# Patient Record
Sex: Male | Born: 1950
Health system: Southern US, Community
[De-identification: ages and names within clinical notes are randomized; demographics above are authoritative.]

## PROBLEM LIST (undated history)

## (undated) DIAGNOSIS — C649 Malignant neoplasm of unspecified kidney, except renal pelvis: Secondary | ICD-10-CM

## (undated) DIAGNOSIS — I214 Non-ST elevation (NSTEMI) myocardial infarction: Secondary | ICD-10-CM

## (undated) DIAGNOSIS — C419 Malignant neoplasm of bone and articular cartilage, unspecified: Secondary | ICD-10-CM

## (undated) DIAGNOSIS — M109 Gout, unspecified: Secondary | ICD-10-CM

## (undated) DIAGNOSIS — M1990 Unspecified osteoarthritis, unspecified site: Secondary | ICD-10-CM

## (undated) DIAGNOSIS — C801 Malignant (primary) neoplasm, unspecified: Secondary | ICD-10-CM

## (undated) DIAGNOSIS — B029 Zoster without complications: Secondary | ICD-10-CM

## (undated) DIAGNOSIS — I10 Essential (primary) hypertension: Secondary | ICD-10-CM

## (undated) HISTORY — PX: KNEE SURGERY: SHX244

---

## 2013-08-31 DIAGNOSIS — B029 Zoster without complications: Secondary | ICD-10-CM

## 2013-08-31 HISTORY — DX: Zoster without complications: B02.9

## 2014-01-09 ENCOUNTER — Emergency Department (HOSPITAL_BASED_OUTPATIENT_CLINIC_OR_DEPARTMENT_OTHER)
Admission: EM | Admit: 2014-01-09 | Discharge: 2014-01-09 | Disposition: A | Discharge status: Home / Self Care | Payer: Medicare Other | Attending: Emergency Medicine | Admitting: Emergency Medicine

## 2014-01-09 ENCOUNTER — Encounter (HOSPITAL_BASED_OUTPATIENT_CLINIC_OR_DEPARTMENT_OTHER): Payer: Self-pay | Admitting: *Deleted

## 2014-01-09 ENCOUNTER — Emergency Department (HOSPITAL_BASED_OUTPATIENT_CLINIC_OR_DEPARTMENT_OTHER): Payer: Medicare Other

## 2014-01-09 DIAGNOSIS — Z880 Allergy status to penicillin: Secondary | ICD-10-CM | POA: Insufficient documentation

## 2014-01-09 DIAGNOSIS — R0989 Other specified symptoms and signs involving the circulatory and respiratory systems: Secondary | ICD-10-CM | POA: Diagnosis not present

## 2014-01-09 DIAGNOSIS — I509 Heart failure, unspecified: Secondary | ICD-10-CM | POA: Diagnosis not present

## 2014-01-09 DIAGNOSIS — I10 Essential (primary) hypertension: Secondary | ICD-10-CM | POA: Insufficient documentation

## 2014-01-09 DIAGNOSIS — R05 Cough: Secondary | ICD-10-CM | POA: Diagnosis present

## 2014-01-09 LAB — CBC
HCT: 40.8 % (ref 39.0–52.0)
Hemoglobin: 13.1 g/dL (ref 13.0–17.0)
MCH: 27.5 pg (ref 26.0–34.0)
MCHC: 32.1 g/dL (ref 30.0–36.0)
MCV: 85.7 fL (ref 78.0–100.0)
Platelets: 311 10*3/uL (ref 150–400)
RBC: 4.76 MIL/uL (ref 4.22–5.81)
RDW: 15 % (ref 11.5–15.5)
WBC: 7.1 10*3/uL (ref 4.0–10.5)

## 2014-01-09 LAB — BASIC METABOLIC PANEL
Anion gap: 14 (ref 5–15)
BUN: 13 mg/dL (ref 6–23)
CALCIUM: 9.9 mg/dL (ref 8.4–10.5)
CO2: 26 meq/L (ref 19–32)
CREATININE: 0.9 mg/dL (ref 0.50–1.35)
Chloride: 102 mEq/L (ref 96–112)
GFR calc non Af Amer: 87 mL/min — ABNORMAL LOW (ref >90)
Glucose, Bld: 115 mg/dL — ABNORMAL HIGH (ref 70–99)
Potassium: 4.2 mEq/L (ref 3.7–5.3)
SODIUM: 142 meq/L (ref 137–147)

## 2014-01-09 LAB — PRO B NATRIURETIC PEPTIDE: Pro B Natriuretic peptide (BNP): 1971 pg/mL — ABNORMAL HIGH (ref 0–125)

## 2014-01-09 LAB — TROPONIN I: Troponin I: <0.3 ng/mL (ref <0.30)

## 2014-01-09 MED ORDER — FUROSEMIDE 10 MG/ML IJ SOLN
20.0000 mg | Freq: Once | INTRAMUSCULAR | Status: AC
  Administered 2014-01-09: 20 mg via INTRAVENOUS
  Filled 2014-01-09: qty 2

## 2014-01-09 MED ORDER — ENALAPRIL MALEATE 10 MG PO TABS: 5.0000 mg | ORAL_TABLET | Freq: Every day | ORAL | Status: DC

## 2014-01-09 MED ORDER — FUROSEMIDE 20 MG PO TABS: 20.0000 mg | ORAL_TABLET | Freq: Every day | ORAL | Status: DC

## 2014-01-09 MED ORDER — LISINOPRIL 10 MG PO TABS
5.0000 mg | ORAL_TABLET | Freq: Once | ORAL | Status: AC
Start: 2014-01-09 — End: 2014-01-09
  Administered 2014-01-09: 5 mg via ORAL
  Filled 2014-01-09: qty 1

## 2014-01-09 MED ORDER — CARVEDILOL 3.125 MG PO TABS: 3.1250 mg | ORAL_TABLET | Freq: Two times a day (BID) | ORAL | Status: DC

## 2014-01-09 NOTE — ED Provider Notes (Signed)
CSN: [id number]     Arrival date & time 01/09/14  3016 History   First MD Initiated Contact with Patient 01/09/14 1018     Chief Complaint  Patient presents with  . Cough     (Consider location/radiation/quality/duration/timing/severity/associated sxs/prior Treatment) HPI Complains of nonproductive cough for 2 months, unchanged he presents today because "I was up all night last night coughing". Denies chest pain denies fever denies shortness of breath no treatment prior to coming here. Nothing makes symptoms better or worse. History reviewed. No pertinent past medical history. Past Surgical History  Procedure Laterality Date  . Knee surgery     History reviewed. No pertinent family history. History  Substance Use Topics  . Smoking status: Never Smoker   . Smokeless tobacco: Not on file  . Alcohol Use: Yes    Review of Systems  Constitutional: Negative.   HENT: Negative.   Respiratory: Positive for cough.   Cardiovascular: Negative.   Gastrointestinal: Negative.   Musculoskeletal: Negative.   Skin: Negative.   Neurological: Negative.   Psychiatric/Behavioral: Negative.   All other systems reviewed and are negative.     Allergies  Penicillins  Home Medications   Prior to Admission medications   Not on File   BP 205/112 mmHg  Pulse 99  Temp(Src) 98 F (36.7 C) (Oral)  Resp 20  Ht 5\' 8"  (1.727 m)  Wt 205 lb (92.987 kg)  BMI 31.18 kg/m2  SpO2 96% Physical Exam  Constitutional: He appears well-developed and well-nourished.  HENT:  Head: Normocephalic and atraumatic.  Eyes: Conjunctivae are normal. Pupils are equal, round, and reactive to light.  Neck: Neck supple. No tracheal deviation present. No thyromegaly present.  Cardiovascular: Normal rate and regular rhythm.   No murmur heard. Pulmonary/Chest: Effort normal and breath sounds normal.  Abdominal: Soft. Bowel sounds are normal. He exhibits no distension. There is no tenderness.  Musculoskeletal:  Normal range of motion. He exhibits no edema or tenderness.  Neurological: He is alert. Coordination normal.  Skin: Skin is warm and dry. No rash noted.  Psychiatric: He has a normal mood and affect.  Nursing note and vitals reviewed.   ED Course  Procedures (including critical care time) Labs Review Labs Reviewed  BASIC METABOLIC PANEL    Imaging Review No results found.   EKG Interpretation   Date/Time:  Monday January 09 2014 09:54:58 EST Ventricular Rate:  106 PR Interval:  146 QRS Duration: 86 QT Interval:  366 QTC Calculation: 486 R Axis:   51 Text Interpretation:  Sinus tachycardia Left ventricular hypertrophy with  repolarization abnormality Abnormal ECG No old tracing to compare  Confirmed by Tyshawn Keel  MD, Natosha Bou [phone]) on 01/09/2014 10:23:34 AM     2pmPatient diuresed 1165 mL after treatment with intravenous Lasix. Patient feels well and ready to go home Chest x-ray viewed by me Results for orders placed or performed during the hospital encounter of 23/55/73  Basic metabolic panel  Result Value Ref Range   Sodium 142 137 - 147 mEq/L   Potassium 4.2 3.7 - 5.3 mEq/L   Chloride 102 96 - 112 mEq/L   CO2 26 19 - 32 mEq/L   Glucose, Bld 115 (H) 70 - 99 mg/dL   BUN 13 6 - 23 mg/dL   Creatinine, Ser 0.90 0.50 - 1.35 mg/dL   Calcium 9.9 8.4 - 10.5 mg/dL   GFR calc non Af Amer 87 (L) >90 mL/min   GFR calc Af Amer >90 >90 mL/min   Anion gap  14 5 - 15  Pro b natriuretic peptide (BNP)  Result Value Ref Range   Pro B Natriuretic peptide (BNP) 1971.0 (H) 0 - 125 pg/mL  Troponin I  Result Value Ref Range   Troponin I <0.30 <0.30 ng/mL  CBC  Result Value Ref Range   WBC 7.1 4.0 - 10.5 K/uL   RBC 4.76 4.22 - 5.81 MIL/uL   Hemoglobin 13.1 13.0 - 17.0 g/dL   HCT 40.8 39.0 - 52.0 %   MCV 85.7 78.0 - 100.0 fL   MCH 27.5 26.0 - 34.0 pg   MCHC 32.1 30.0 - 36.0 g/dL   RDW 15.0 11.5 - 15.5 %   Platelets 311 150 - 400 K/uL   Dg Chest 2 View  01/09/2014   CLINICAL  DATA:  Cough for 2 months  EXAM: CHEST  2 VIEW  COMPARISON:  None  FINDINGS: Enlargement of cardiac silhouette with pulmonary vascular congestion.  Mediastinal contours normal.  Peribronchial thickening with accentuation of interstitial markings in a perihilar regions with associated Kerley B-lines at the lung bases favor mild pulmonary edema.  No segmental consolidation, pleural effusion or pneumothorax.  Mild scattered endplate spur formation thoracic spine.  IMPRESSION: Enlargement of cardiac silhouette with pulmonary vascular congestion and probable mild pulmonary edema.   Electronically Signed   By: Lavonia Dana M.D.   On: 01/09/2014 10:53    MDM   Clinically patient and mild congestive heart failure, he speaks in paragraphs is in no rest for distress. Normal pulse ox. Hospitalization offer to patient and encouraged. He vehemently declines hospitalization. I spoke with Dr. Claiborne Billings plan Lasix 29 g IV prior to discharge prescriptions lisinopril 5 mgdaily, Coreg 3.125 mgtwice daily. An appointment has been scheduled for him to see Dr. [name] on 01/13/14 at office Final diagnoses:  None  diagnoses #1 congestive heart failure #2 hypertension      Orlie Dakin, MD 01/09/14 1409

## 2014-01-09 NOTE — Discharge Instructions (Signed)
Heart Failure An office appointment has been scheduled for you with Dr. [name] for Friday, November 13. Be at his office at 2:45 PM. Return if your condition worsens for any reason Heart failure is a condition in which the heart has trouble pumping blood. This means your heart does not pump blood efficiently for your body to work well. In some cases of heart failure, fluid may back up into your lungs or you may have swelling (edema) in your lower legs. Heart failure is usually a long-term (chronic) condition. It is important for you to take good care of yourself and follow your health care provider's treatment plan. CAUSES  Some health conditions can cause heart failure. Those health conditions include:  High blood pressure (hypertension). Hypertension causes the heart muscle to work harder than normal. When pressure in the blood vessels is high, the heart needs to pump (contract) with more force in order to circulate blood throughout the body. High blood pressure eventually causes the heart to become stiff and weak.  Coronary artery disease (CAD). CAD is the buildup of cholesterol and fat (plaque) in the arteries of the heart. The blockage in the arteries deprives the heart muscle of oxygen and blood. This can cause chest pain and may lead to a heart attack. High blood pressure can also contribute to CAD.  Heart attack (myocardial infarction). A heart attack occurs when one or more arteries in the heart become blocked. The loss of oxygen damages the muscle tissue of the heart. When this happens, part of the heart muscle dies. The injured tissue does not contract as well and weakens the heart's ability to pump blood.  Abnormal heart valves. When the heart valves do not open and close properly, it can cause heart failure. This makes the heart muscle pump harder to keep the blood flowing.  Heart muscle disease (cardiomyopathy or myocarditis). Heart muscle disease is damage to the heart muscle from a  variety of causes. These can include drug or alcohol abuse, infections, or unknown reasons. These can increase the risk of heart failure.  Lung disease. Lung disease makes the heart work harder because the lungs do not work properly. This can cause a strain on the heart, leading it to fail.  Diabetes. Diabetes increases the risk of heart failure. High blood sugar contributes to high fat (lipid) levels in the blood. Diabetes can also cause slow damage to tiny blood vessels that carry important nutrients to the heart muscle. When the heart does not get enough oxygen and food, it can cause the heart to become weak and stiff. This leads to a heart that does not contract efficiently.  Other conditions can contribute to heart failure. These include abnormal heart rhythms, thyroid problems, and low blood counts (anemia). Certain unhealthy behaviors can increase the risk of heart failure, including:  Being overweight.  Smoking or chewing tobacco.  Eating foods high in fat and cholesterol.  Abusing illicit drugs or alcohol.  Lacking physical activity. SYMPTOMS  Heart failure symptoms may vary and can be hard to detect. Symptoms may include:  Shortness of breath with activity, such as climbing stairs.  Persistent cough.  Swelling of the feet, ankles, legs, or abdomen.  Unexplained weight gain.  Difficulty breathing when lying flat (orthopnea).  Waking from sleep because of the need to sit up and get more air.  Rapid heartbeat.  Fatigue and loss of energy.  Feeling light-headed, dizzy, or close to fainting.  Loss of appetite.  Nausea.  Increased urination  during the night (nocturia). DIAGNOSIS  A diagnosis of heart failure is based on your history, symptoms, physical examination, and diagnostic tests. Diagnostic tests for heart failure may include:  Echocardiography.  Electrocardiography.  Chest X-ray.  Blood tests.  Exercise stress test.  Cardiac  angiography.  Radionuclide scans. TREATMENT  Treatment is aimed at managing the symptoms of heart failure. Medicines, behavioral changes, or surgical intervention may be necessary to treat heart failure.  Medicines to help treat heart failure may include:  Angiotensin-converting enzyme (ACE) inhibitors. This type of medicine blocks the effects of a blood protein called angiotensin-converting enzyme. ACE inhibitors relax (dilate) the blood vessels and help lower blood pressure.  Angiotensin receptor blockers (ARBs). This type of medicine blocks the actions of a blood protein called angiotensin. Angiotensin receptor blockers dilate the blood vessels and help lower blood pressure.  Water pills (diuretics). Diuretics cause the kidneys to remove salt and water from the blood. The extra fluid is removed through urination. This loss of extra fluid lowers the volume of blood the heart pumps.  Beta blockers. These prevent the heart from beating too fast and improve heart muscle strength.  Digitalis. This increases the force of the heartbeat.  Healthy behavior changes include:  Obtaining and maintaining a healthy weight.  Stopping smoking or chewing tobacco.  Eating heart-healthy foods.  Limiting or avoiding alcohol.  Stopping illicit drug use.  Physical activity as directed by your health care provider.  Surgical treatment for heart failure may include:  A procedure to open blocked arteries, repair damaged heart valves, or remove damaged heart muscle tissue.  A pacemaker to improve heart muscle function and control certain abnormal heart rhythms.  An internal cardioverter defibrillator to treat certain serious abnormal heart rhythms.  A left ventricular assist device (LVAD) to assist the pumping ability of the heart. HOME CARE INSTRUCTIONS   Take medicines only as directed by your health care provider. Medicines are important in reducing the workload of your heart, slowing the  progression of heart failure, and improving your symptoms.  Do not stop taking your medicine unless directed by your health care provider.  Do not skip any dose of medicine.  Refill your prescriptions before you run out of medicine. Your medicines are needed every day.  Engage in moderate physical activity if directed by your health care provider. Moderate physical activity can benefit some people. The elderly and people with severe heart failure should consult with a health care provider for physical activity recommendations.  Eat heart-healthy foods. Food choices should be free of trans fat and low in saturated fat, cholesterol, and salt (sodium). Healthy choices include fresh or frozen fruits and vegetables, fish, lean meats, legumes, fat-free or low-fat dairy products, and whole grain or high fiber foods. Talk to a dietitian to learn more about heart-healthy foods.  Limit sodium if directed by your health care provider. Sodium restriction may reduce symptoms of heart failure in some people. Talk to a dietitian to learn more about heart-healthy seasonings.  Use healthy cooking methods. Healthy cooking methods include roasting, grilling, broiling, baking, poaching, steaming, or stir-frying. Talk to a dietitian to learn more about healthy cooking methods.  Limit fluids if directed by your health care provider. Fluid restriction may reduce symptoms of heart failure in some people.  Weigh yourself every day. Daily weights are important in the early recognition of excess fluid. You should weigh yourself every morning after you urinate and before you eat breakfast. [place] the same amount of clothing each  time you weigh yourself. Record your daily weight. Provide your health care provider with your weight record.  Monitor and record your blood pressure if directed by your health care provider.  Check your pulse if directed by your health care provider.  Lose weight if directed by your health care  provider. Weight loss may reduce symptoms of heart failure in some people.  Stop smoking or chewing tobacco. Nicotine makes your heart work harder by causing your blood vessels to constrict. Do not use nicotine gum or patches before talking to your health care provider.  Keep all follow-up visits as directed by your health care provider. This is important.  Limit alcohol intake to no more than 1 drink per day for nonpregnant women and 2 drinks per day for men. One drink equals 12 ounces of beer, 5 ounces of wine, or 1 ounces of hard liquor. Drinking more than that is harmful to your heart. Tell your health care provider if you drink alcohol several times a week. Talk with your health care provider about whether alcohol is safe for you. If your heart has already been damaged by alcohol or you have severe heart failure, drinking alcohol should be stopped completely.  Stop illicit drug use.  Stay up-to-date with immunizations. It is especially important to prevent respiratory infections through current pneumococcal and influenza immunizations.  Manage other health conditions such as hypertension, diabetes, thyroid disease, or abnormal heart rhythms as directed by your health care provider.  Learn to manage stress.  Plan rest periods when fatigued.  Learn strategies to manage high temperatures. If the weather is extremely hot:  Avoid vigorous physical activity.  Use air conditioning or fans or seek a cooler location.  Avoid caffeine and alcohol.  [place] loose-fitting, lightweight, and light-colored clothing.  Learn strategies to manage cold temperatures. If the weather is extremely cold:  Avoid vigorous physical activity.  Layer clothes.  [place] mittens or gloves, a hat, and a scarf when going outside.  Avoid alcohol.  Obtain ongoing education and support as needed.  Participate in or seek rehabilitation as needed to maintain or improve independence and quality of life. SEEK MEDICAL  CARE IF:   Your weight increases by 03 lb/1.4 kg in 1 day or 05 lb/2.3 kg in a week.  You have increasing shortness of breath that is unusual for you.  You are unable to participate in your usual physical activities.  You tire easily.  You cough more than normal, especially with physical activity.  You have any or more swelling in areas such as your hands, feet, ankles, or abdomen.  You are unable to sleep because it is hard to breathe.  You feel like your heart is beating fast (palpitations).  You become dizzy or light-headed upon standing up. SEEK IMMEDIATE MEDICAL CARE IF:   You have difficulty breathing.  There is a change in mental status such as decreased alertness or difficulty with concentration.  You have a pain or discomfort in your chest.  You have an episode of fainting (syncope). MAKE SURE YOU:   Understand these instructions.  Will watch your condition.  Will get help right away if you are not doing well or get worse. Document Released: 02/17/2005 Document Revised: 07/04/2013 Document Reviewed: 03/19/2012 [facility] Patient Information 2015 [place], [place]. This information is not intended to replace advice given to you by your health care provider. Make sure you discuss any questions you have with your health care provider.

## 2014-01-09 NOTE — ED Notes (Signed)
Dry cough x 2 months, denies fever or SOB. Pt is aware that his BP runs high but takes no meds and has not seen a PMD in 30 years or more

## 2014-01-09 NOTE — ED Notes (Signed)
Patient transported to X-ray 

## 2014-01-09 NOTE — ED Notes (Signed)
MD at bedside. 

## 2014-01-09 NOTE — ED Notes (Signed)
[facility]ia  [facility]--spoke with Derek [name]

## 2014-01-13 ENCOUNTER — Ambulatory Visit (INDEPENDENT_AMBULATORY_CARE_PROVIDER_SITE_OTHER): Payer: Medicare Other | Admitting: Internal Medicine

## 2014-01-13 ENCOUNTER — Encounter: Payer: Self-pay | Admitting: Internal Medicine

## 2014-01-13 VITALS — BP 130/82 | HR 93 | Ht 68.0 in | Wt 211.7 lb

## 2014-01-13 DIAGNOSIS — I509 Heart failure, unspecified: Secondary | ICD-10-CM | POA: Insufficient documentation

## 2014-01-13 DIAGNOSIS — I110 Hypertensive heart disease with heart failure: Secondary | ICD-10-CM | POA: Diagnosis not present

## 2014-01-13 DIAGNOSIS — I119 Hypertensive heart disease without heart failure: Secondary | ICD-10-CM | POA: Insufficient documentation

## 2014-01-13 DIAGNOSIS — I10 Essential (primary) hypertension: Secondary | ICD-10-CM | POA: Insufficient documentation

## 2014-01-13 MED ORDER — FUROSEMIDE 20 MG PO TABS: 20.0000 mg | ORAL_TABLET | Freq: Every day | ORAL | Status: DC

## 2014-01-13 MED ORDER — ENALAPRIL MALEATE 10 MG PO TABS: 5.0000 mg | ORAL_TABLET | Freq: Every day | ORAL | Status: DC

## 2014-01-13 MED ORDER — CARVEDILOL 3.125 MG PO TABS
3.1250 mg | ORAL_TABLET | Freq: Two times a day (BID) | ORAL | Status: DC
Start: 2014-01-13 — End: 2014-02-08

## 2014-01-13 NOTE — Progress Notes (Signed)
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 Unfortunately he has an aversion to physicians and has not seen a doctor in about 40 years. He recently presented to urgent care at [facility] [place] for progressive cough and was noted to be markedly hypertensive on presentation with a blood pressure of 205/112. Laboratory work revealed an elevated BNP of 1971. Chest x-ray demonstrated cardiomegaly without overt congestion. He denied any shortness of breath or worsening chest pain. He had been on aspirin for a long time for prophylaxis. He was recommended he start on Coreg 3.125 mg twice daily, enalapril 5 mg daily and furosemide 20 mg daily. He reports over the next couple of days a marked improvement in his cough and the fact that he lost about 3 pounds. Blood pressure is notably improved today at 130/82. EKG in the office demonstrates normal sinus rhythm with LVH by voltage at a rate of 93. There is no significant history of hypertension or heart disease in the family. Both parents had lung cancer and died of that.  PMHx:  History reviewed. No pertinent past medical history.  Past Surgical History  Procedure Laterality Date  . Knee surgery      FAMHx:  Family History  Problem Relation Age of Onset  . Cancer Mother   . Cancer Father     SOCHx:   reports that he has never smoked. He has never used smokeless tobacco. He reports that he drinks about 1.8 - 2.4 oz of alcohol per week. He reports that he does not use illicit drugs.  ALLERGIES:  Allergies  Allergen Reactions  . Penicillins Anaphylaxis    ROS: A comprehensive review of systems was negative.  HOME MEDS: Current Outpatient Prescriptions  Medication Sig Dispense Refill  . aspirin 81 MG tablet Take 81 mg by mouth daily.    . carvedilol (COREG) 3.125 MG tablet Take 1 tablet (3.125  mg total) by mouth 2 (two) times daily with a meal. 180 tablet 1  . Cholecalciferol (VITAMIN D-3) 5000 UNITS TABS Take by mouth daily.    . enalapril (VASOTEC) 10 MG tablet Take 0.5 tablets (5 mg total) by mouth daily. 90 tablet 1  . furosemide (LASIX) 20 MG tablet Take 1 tablet (20 mg total) by mouth daily. 90 tablet 1  . L-Tryptophan 500 MG CAPS Take 1,000 mg by mouth daily.    . Melatonin 5 MG TABS Take by mouth at bedtime.    . Multiple Vitamin (MULTIVITAMIN) capsule Take 1 capsule by mouth daily.    . Nutritional Supplements (GRAPESEED EXTRACT PO) Take by mouth daily. Resveratrol    . Omega-3 Fatty Acids (OMEGA 3 PO) Take 1,280 mg by mouth daily.    . Valerian 500 MG CAPS Take 2 capsules by mouth daily.     No current facility-administered medications for this visit.    LABS/IMAGING: No results found for this or any previous visit (from the past 48 hour(s)). No results found.  VITALS: BP 130/82 mmHg  Pulse 93  Ht 5\' 8"  (1.727 m)  Wt 211 lb 11.2 oz (96.026 kg)  BMI 32.20 kg/m2  EXAM: General appearance: alert and no distress Neck: JVD - 3 cm above sternal notch, no carotid bruit and thyroid not enlarged, symmetric, no tenderness/mass/nodules Lungs: clear to auscultation bilaterally Heart: regular rate and rhythm, S1, S2 normal  and no S3 or S4 Abdomen: soft, non-tender; bowel sounds normal; no masses,  no organomegaly Extremities: extremities normal, atraumatic, no cyanosis or edema Pulses: 2+ and symmetric Skin: Skin color, texture, turgor normal. No rashes or lesions Neurologic: Grossly normal Psych: Normal  EKG: Normal sinus rhythm at 93, LVH with repolarization abnormality  ASSESSMENT: 1. Acute congestive heart failure, NYHA class I symptoms 2. LVH by voltage 3. Presumed long-standing uncontrolled hypertension  PLAN: 1.   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 He did  have uncontrolled hypertension on presentation to the emergency department and has had marked improvement in his blood pressure on his current medications. He reports resolution of his cough with diuretics, cough that was not responsive to cough medications. There is evidence for LVH by voltage on his EKG, and I'm concerned that he may have a hypertensive cardiomyopathy with either systolic or combined systolic and diastolic heart failure. I would recommend an echocardiogram to further evaluate LV function. If he does have systolic dysfunction, he will ultimately need heart catheterization to exclude coronary artery disease.  Plan to see him back to discuss results of his echocardiogram in a few weeks.  [name], MD, [facility] Attending Cardiologist [facility]  [name] C 01/13/2014, 5:15 PM

## 2014-01-13 NOTE — Patient Instructions (Signed)
Your physician has requested that you have an echocardiogram. Echocardiography is a painless test that uses sound waves to create images of your heart. It provides your doctor with information about the size and shape of your heart and how well your heart's chambers and valves are working. This procedure takes approximately one hour. There are no restrictions for this procedure.  Your physician recommends that you schedule a follow-up appointment in: 1 month with Dr. [name].

## 2014-01-17 ENCOUNTER — Telehealth: Payer: Self-pay | Admitting: Internal Medicine

## 2014-01-17 NOTE — Telephone Encounter (Signed)
Pt's wife Melody Haver called back in leaving an alternate phone where she can be reached. [phone]

## 2014-01-17 NOTE — Telephone Encounter (Signed)
Pt's wife called in stating that her husband is a new pt of Dr. Lysbeth Penner and when he came in to see him, he was prescribed some new medications. Since then he has developed the gout. She was calling in to see is there anything that can be done about switching medications and further treatment for the gout. Please call  Thanks

## 2014-01-17 NOTE — Telephone Encounter (Signed)
Returned call to patient's wife.She stated since husband started on lasix he has developed gout in his rt foot.Stated he does not have a PCP wanting to know if Dr.Hilty can prescribe gout medication and does he need to keep taking lasix.Message sent to Dr.Hilty for advice.

## 2014-01-18 NOTE — Telephone Encounter (Signed)
He will need to be seen in the office to confirm the diagnosis - does he have a known history of gout? I'm not comfortable giving him medication over the phone for it.  Dr. Lemmie Evens

## 2014-01-18 NOTE — Telephone Encounter (Signed)
Returned call to patient's wife.Dr.Hilty advised he will need to schedule appointment to be seen, not comfortable with prescribing gout medication over the phone.Wife stated he will just keep previous appointment with Dr.Hilty 02/17/14.Stated he can not come any sooner.Advised to go to a Urgent Care if needed.

## 2014-01-20 ENCOUNTER — Emergency Department (HOSPITAL_BASED_OUTPATIENT_CLINIC_OR_DEPARTMENT_OTHER)
Admission: EM | Admit: 2014-01-20 | Discharge: 2014-01-20 | Disposition: A | Discharge status: Home / Self Care | Payer: Medicare Other | Source: Home / Self Care | Attending: Emergency Medicine | Admitting: Emergency Medicine

## 2014-01-20 ENCOUNTER — Encounter (HOSPITAL_BASED_OUTPATIENT_CLINIC_OR_DEPARTMENT_OTHER): Payer: Self-pay | Admitting: *Deleted

## 2014-01-20 DIAGNOSIS — J9811 Atelectasis: Secondary | ICD-10-CM | POA: Diagnosis not present

## 2014-01-20 DIAGNOSIS — Z01810 Encounter for preprocedural cardiovascular examination: Secondary | ICD-10-CM | POA: Diagnosis not present

## 2014-01-20 DIAGNOSIS — I214 Non-ST elevation (NSTEMI) myocardial infarction: Principal | ICD-10-CM | POA: Diagnosis present

## 2014-01-20 DIAGNOSIS — M109 Gout, unspecified: Secondary | ICD-10-CM

## 2014-01-20 DIAGNOSIS — Z6830 Body mass index (BMI) 30.0-30.9, adult: Secondary | ICD-10-CM

## 2014-01-20 DIAGNOSIS — Z79899 Other long term (current) drug therapy: Secondary | ICD-10-CM

## 2014-01-20 DIAGNOSIS — E785 Hyperlipidemia, unspecified: Secondary | ICD-10-CM | POA: Diagnosis present

## 2014-01-20 DIAGNOSIS — I10 Essential (primary) hypertension: Secondary | ICD-10-CM

## 2014-01-20 DIAGNOSIS — R739 Hyperglycemia, unspecified: Secondary | ICD-10-CM | POA: Diagnosis not present

## 2014-01-20 DIAGNOSIS — Z880 Allergy status to penicillin: Secondary | ICD-10-CM | POA: Insufficient documentation

## 2014-01-20 DIAGNOSIS — I517 Cardiomegaly: Secondary | ICD-10-CM | POA: Diagnosis present

## 2014-01-20 DIAGNOSIS — I2582 Chronic total occlusion of coronary artery: Secondary | ICD-10-CM | POA: Diagnosis present

## 2014-01-20 DIAGNOSIS — E669 Obesity, unspecified: Secondary | ICD-10-CM | POA: Diagnosis present

## 2014-01-20 DIAGNOSIS — R7309 Other abnormal glucose: Secondary | ICD-10-CM | POA: Diagnosis present

## 2014-01-20 DIAGNOSIS — I43 Cardiomyopathy in diseases classified elsewhere: Secondary | ICD-10-CM | POA: Diagnosis present

## 2014-01-20 DIAGNOSIS — I252 Old myocardial infarction: Secondary | ICD-10-CM

## 2014-01-20 DIAGNOSIS — R05 Cough: Secondary | ICD-10-CM | POA: Diagnosis not present

## 2014-01-20 DIAGNOSIS — I110 Hypertensive heart disease with heart failure: Secondary | ICD-10-CM | POA: Diagnosis present

## 2014-01-20 DIAGNOSIS — Z7982 Long term (current) use of aspirin: Secondary | ICD-10-CM

## 2014-01-20 DIAGNOSIS — R0789 Other chest pain: Secondary | ICD-10-CM | POA: Diagnosis not present

## 2014-01-20 DIAGNOSIS — J810 Acute pulmonary edema: Secondary | ICD-10-CM | POA: Diagnosis not present

## 2014-01-20 DIAGNOSIS — I5041 Acute combined systolic (congestive) and diastolic (congestive) heart failure: Secondary | ICD-10-CM | POA: Diagnosis present

## 2014-01-20 DIAGNOSIS — I059 Rheumatic mitral valve disease, unspecified: Secondary | ICD-10-CM | POA: Diagnosis not present

## 2014-01-20 DIAGNOSIS — I2510 Atherosclerotic heart disease of native coronary artery without angina pectoris: Secondary | ICD-10-CM | POA: Diagnosis present

## 2014-01-20 DIAGNOSIS — R7989 Other specified abnormal findings of blood chemistry: Secondary | ICD-10-CM | POA: Diagnosis not present

## 2014-01-20 DIAGNOSIS — M10072 Idiopathic gout, left ankle and foot: Secondary | ICD-10-CM | POA: Diagnosis not present

## 2014-01-20 DIAGNOSIS — I509 Heart failure, unspecified: Secondary | ICD-10-CM | POA: Diagnosis not present

## 2014-01-20 DIAGNOSIS — Z951 Presence of aortocoronary bypass graft: Secondary | ICD-10-CM | POA: Diagnosis not present

## 2014-01-20 DIAGNOSIS — R918 Other nonspecific abnormal finding of lung field: Secondary | ICD-10-CM | POA: Diagnosis not present

## 2014-01-20 HISTORY — DX: Essential (primary) hypertension: I10

## 2014-01-20 HISTORY — DX: Gout, unspecified: M10.9

## 2014-01-20 MED ORDER — PREDNISONE 20 MG PO TABS: ORAL_TABLET | ORAL | Status: DC

## 2014-01-20 NOTE — ED Notes (Signed)
Pt. Reports history of gout and was placed on lasix 2 wks ago here for B/P needs.  Pt. Reports last episode of gout was 2 yrs ago.  Pt. Reports no long trips car or plane.

## 2014-01-20 NOTE — ED Provider Notes (Signed)
CSN: [id number]     Arrival date & time 01/20/14  1417 History   First MD Initiated Contact with Patient 01/20/14 1637     Chief Complaint  Patient presents with  . 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 He does have a new cardiologist to follow up his blood pressure but has not been able to find a new primary care physician yet. He was recently seen in the emergency department for new onset heart failure with elevated blood pressure and did follow up with cardiology although had not seen a physician in over 30 years prior to his last ED visit. Past Medical History  Diagnosis Date  . Hypertension   . Gout   . NSTEMI (non-ST elevated myocardial infarction)    Past Surgical History  Procedure Laterality Date  . Knee surgery     Family History  Problem Relation Age of Onset  . Cancer Mother   . Cancer Father    History  Substance Use Topics  . Smoking status: Never Smoker   . Smokeless tobacco: Never Used  . Alcohol Use: Yes     Comment: 1-2 drinks/week    Review of Systems  10 Systems reviewed and are negative for acute change except as noted in the HPI.  Allergies  Penicillins  Home Medications   Prior to Admission medications   Medication Sig Start Date End Date Taking?  Authorizing Provider  aspirin 81 MG tablet Take 81 mg by mouth daily.    Historical Provider, MD  carvedilol (COREG) 3.125 MG tablet Take 1 tablet (3.125 mg total) by mouth 2 (two) times daily with a meal. 01/13/14   [name], MD  Cholecalciferol (VITAMIN D-3) 5000 UNITS TABS Take 1 tablet by mouth daily.     Historical Provider, MD  enalapril (VASOTEC) 10 MG tablet Take 0.5 tablets (5 mg total) by mouth daily. 01/13/14   [name], MD  furosemide (LASIX) 20 MG tablet Take 1 tablet (20 mg total) by mouth daily. 01/13/14   [name], MD  L-Tryptophan 500 MG CAPS Take 1,000 mg by mouth daily.    Historical Provider, MD  Melatonin 5 MG TABS Take by mouth at bedtime.    Historical Provider, MD  Multiple Vitamin (MULTIVITAMIN) capsule Take 1 capsule by mouth daily.    Historical Provider, MD  Nutritional Supplements (GRAPESEED EXTRACT PO) Take by mouth daily. Resveratrol    Historical Provider, MD  Omega-3 Fatty Acids (OMEGA 3 PO) Take 1,280 mg by mouth daily.    Historical Provider, MD  Valerian 500 MG CAPS Take 2 capsules by mouth daily.    Historical Provider, MD   BP 175/86 mmHg  Pulse 92  Temp(Src) 97.8 F (36.6 C) (Oral)  Resp 18  Ht 5\' 8"  (1.727 m)  Wt 200 lb (90.719 kg)  BMI  30.42 kg/m2  SpO2 100% Physical Exam  Constitutional:  Awake, alert, nontoxic appearance.  HENT:  Head: Atraumatic.  Eyes: Right eye exhibits no discharge. Left eye exhibits no discharge.  Neck: Neck supple.  Cardiovascular: Normal rate and regular rhythm.   No murmur heard. Pulmonary/Chest: Effort normal and breath sounds normal. No respiratory distress. He has no wheezes. He has no rales. He exhibits no tenderness.  Abdominal: Soft. Bowel sounds are normal. He exhibits no distension. There is no tenderness. There is no rebound and no guarding.  Musculoskeletal: He exhibits tenderness. He exhibits no edema.  Baseline ROM, no obvious new focal weakness. Right leg is nontender left leg is  nontender at the thigh knee and calf with well localized tenderness without erythema or swelling to his left ankle and without tenderness to his left foot, his left foot his dorsalis pedis pulse intact with capillary refill less than 2 seconds normal light touch and good movement of his left foot toes with limited painful movement to his left ankle.  Neurological: He is alert.  Mental status and motor strength appears baseline for patient and situation.  Skin: No rash noted.  Psychiatric: He has a normal mood and affect.  Nursing note and vitals reviewed.   ED Course  Procedures (including critical care time) Labs Review Labs Reviewed - No data to display  Imaging Review No results found.   EKG Interpretation None      MDM   Final diagnoses:  Acute gout of left ankle, unspecified cause    Patient / Family / Caregiver informed of clinical course, understand medical decision-making process, and agree with plan. I doubt any other EMC precluding discharge at this time including, but not necessarily limited to the following:septic joint.    Babette Relic, MD 01/26/14 [phone]

## 2014-01-20 NOTE — Discharge Instructions (Signed)
Gout Gout is when your joints become red, sore, and swell (inflamed). This is caused by the buildup of uric acid crystals in the joints. Uric acid is a chemical that is normally in the blood. If the level of uric acid gets too high in the blood, these crystals form in your joints and tissues. Over time, these crystals can form into masses near the joints and tissues. These masses can destroy bone and cause the bone to look misshapen (deformed). HOME CARE   Do not take aspirin for pain.  Only take medicine as told by your doctor.  Rest the joint as much as you can. When in bed, keep sheets and blankets off painful areas.  Keep the sore joints raised (elevated).  Put warm or cold packs on painful joints. Use of warm or cold packs depends on which works best for you.  Use crutches if the painful joint is in your leg.  Drink enough fluids to keep your pee (urine) clear or pale yellow. Limit alcohol, sugary drinks, and drinks with fructose in them.  Follow your diet instructions. Pay careful attention to how much protein you eat. Include fruits, vegetables, whole grains, and fat-free or low-fat milk products in your daily diet. Talk to your doctor or dietitian about the use of coffee, vitamin C, and cherries. These may help lower uric acid levels.  Keep a healthy body weight. GET HELP RIGHT AWAY IF:   You have watery poop (diarrhea), throw up (vomit), or have any side effects from medicines.  You do not feel better in 24 hours, or you are getting worse.  Your joint becomes suddenly more tender, and you have chills or a fever.  Return sooner also if you develop chest pain, shortness of breath, pain or swelling or tenderness to your calf or thigh or develop other concerns. MAKE SURE YOU:   Understand these instructions.  Will watch your condition.  Will get help right away if you are not doing well or get worse. Document Released: 11/27/2007 Document Revised: 07/04/2013 Document  Reviewed: 10/01/2011 [facility] Patient Information 2015 [place], [place]. This information is not intended to replace advice given to you by your health care provider. Make sure you discuss any questions you have with your health care provider.

## 2014-01-21 ENCOUNTER — Encounter (HOSPITAL_BASED_OUTPATIENT_CLINIC_OR_DEPARTMENT_OTHER): Payer: Self-pay | Admitting: *Deleted

## 2014-01-21 ENCOUNTER — Inpatient Hospital Stay (HOSPITAL_BASED_OUTPATIENT_CLINIC_OR_DEPARTMENT_OTHER)
Admission: EM | Admit: 2014-01-21 | Discharge: 2014-02-01 | DRG: 233 | Disposition: A | Discharge status: Home / Self Care | Payer: Medicare Other | Attending: Surgery | Admitting: Surgery

## 2014-01-21 ENCOUNTER — Emergency Department (HOSPITAL_BASED_OUTPATIENT_CLINIC_OR_DEPARTMENT_OTHER): Payer: Medicare Other

## 2014-01-21 DIAGNOSIS — R0789 Other chest pain: Secondary | ICD-10-CM | POA: Diagnosis not present

## 2014-01-21 DIAGNOSIS — I5041 Acute combined systolic (congestive) and diastolic (congestive) heart failure: Secondary | ICD-10-CM | POA: Diagnosis not present

## 2014-01-21 DIAGNOSIS — M109 Gout, unspecified: Secondary | ICD-10-CM | POA: Diagnosis not present

## 2014-01-21 DIAGNOSIS — I160 Hypertensive urgency: Secondary | ICD-10-CM

## 2014-01-21 DIAGNOSIS — I059 Rheumatic mitral valve disease, unspecified: Secondary | ICD-10-CM | POA: Diagnosis not present

## 2014-01-21 DIAGNOSIS — I110 Hypertensive heart disease with heart failure: Secondary | ICD-10-CM | POA: Diagnosis present

## 2014-01-21 DIAGNOSIS — R7303 Prediabetes: Secondary | ICD-10-CM | POA: Diagnosis present

## 2014-01-21 DIAGNOSIS — I214 Non-ST elevation (NSTEMI) myocardial infarction: Secondary | ICD-10-CM | POA: Diagnosis present

## 2014-01-21 DIAGNOSIS — E785 Hyperlipidemia, unspecified: Secondary | ICD-10-CM | POA: Diagnosis present

## 2014-01-21 DIAGNOSIS — Z880 Allergy status to penicillin: Secondary | ICD-10-CM | POA: Diagnosis not present

## 2014-01-21 DIAGNOSIS — I509 Heart failure, unspecified: Secondary | ICD-10-CM

## 2014-01-21 DIAGNOSIS — I43 Cardiomyopathy in diseases classified elsewhere: Secondary | ICD-10-CM | POA: Diagnosis not present

## 2014-01-21 DIAGNOSIS — R739 Hyperglycemia, unspecified: Secondary | ICD-10-CM | POA: Diagnosis not present

## 2014-01-21 DIAGNOSIS — Z951 Presence of aortocoronary bypass graft: Secondary | ICD-10-CM | POA: Diagnosis not present

## 2014-01-21 DIAGNOSIS — R7989 Other specified abnormal findings of blood chemistry: Secondary | ICD-10-CM

## 2014-01-21 DIAGNOSIS — I10 Essential (primary) hypertension: Secondary | ICD-10-CM | POA: Diagnosis not present

## 2014-01-21 DIAGNOSIS — R7309 Other abnormal glucose: Secondary | ICD-10-CM | POA: Diagnosis present

## 2014-01-21 DIAGNOSIS — Z6830 Body mass index (BMI) 30.0-30.9, adult: Secondary | ICD-10-CM | POA: Diagnosis not present

## 2014-01-21 DIAGNOSIS — R52 Pain, unspecified: Secondary | ICD-10-CM

## 2014-01-21 DIAGNOSIS — J810 Acute pulmonary edema: Secondary | ICD-10-CM | POA: Diagnosis not present

## 2014-01-21 DIAGNOSIS — E669 Obesity, unspecified: Secondary | ICD-10-CM | POA: Diagnosis present

## 2014-01-21 DIAGNOSIS — Z7982 Long term (current) use of aspirin: Secondary | ICD-10-CM | POA: Diagnosis not present

## 2014-01-21 DIAGNOSIS — I2510 Atherosclerotic heart disease of native coronary artery without angina pectoris: Secondary | ICD-10-CM

## 2014-01-21 DIAGNOSIS — R918 Other nonspecific abnormal finding of lung field: Secondary | ICD-10-CM | POA: Diagnosis not present

## 2014-01-21 DIAGNOSIS — J9811 Atelectasis: Secondary | ICD-10-CM | POA: Diagnosis not present

## 2014-01-21 DIAGNOSIS — R05 Cough: Secondary | ICD-10-CM | POA: Diagnosis not present

## 2014-01-21 DIAGNOSIS — I2582 Chronic total occlusion of coronary artery: Secondary | ICD-10-CM | POA: Diagnosis not present

## 2014-01-21 DIAGNOSIS — I517 Cardiomegaly: Secondary | ICD-10-CM | POA: Diagnosis present

## 2014-01-21 DIAGNOSIS — Z01810 Encounter for preprocedural cardiovascular examination: Secondary | ICD-10-CM | POA: Diagnosis not present

## 2014-01-21 HISTORY — DX: Non-ST elevation (NSTEMI) myocardial infarction: I21.4

## 2014-01-21 LAB — CBC WITH DIFFERENTIAL/PLATELET
BASOS ABS: 0 10*3/uL (ref 0.0–0.1)
BASOS PCT: 0 % (ref 0–1)
EOS PCT: 0 % (ref 0–5)
Eosinophils Absolute: 0 10*3/uL (ref 0.0–0.7)
HEMATOCRIT: 42 % (ref 39.0–52.0)
Hemoglobin: 13.6 g/dL (ref 13.0–17.0)
Lymphocytes Relative: 7 % — ABNORMAL LOW (ref 12–46)
Lymphs Abs: 0.5 10*3/uL — ABNORMAL LOW (ref 0.7–4.0)
MCH: 27.3 pg (ref 26.0–34.0)
MCHC: 32.4 g/dL (ref 30.0–36.0)
MCV: 84.2 fL (ref 78.0–100.0)
MONO ABS: 0.2 10*3/uL (ref 0.1–1.0)
Monocytes Relative: 3 % (ref 3–12)
Neutro Abs: 6.2 10*3/uL (ref 1.7–7.7)
Neutrophils Relative %: 90 % — ABNORMAL HIGH (ref 43–77)
PLATELETS: 405 10*3/uL — AB (ref 150–400)
RBC: 4.99 MIL/uL (ref 4.22–5.81)
RDW: 14.5 % (ref 11.5–15.5)
WBC: 6.9 10*3/uL (ref 4.0–10.5)

## 2014-01-21 LAB — BASIC METABOLIC PANEL
ANION GAP: 17 — AB (ref 5–15)
BUN: 19 mg/dL (ref 6–23)
CALCIUM: 10.7 mg/dL — AB (ref 8.4–10.5)
CO2: 23 mEq/L (ref 19–32)
CREATININE: 0.8 mg/dL (ref 0.50–1.35)
Chloride: 100 mEq/L (ref 96–112)
GFR calc non Af Amer: >90 mL/min (ref >90)
Glucose, Bld: 200 mg/dL — ABNORMAL HIGH (ref 70–99)
Potassium: 4.5 mEq/L (ref 3.7–5.3)
SODIUM: 140 meq/L (ref 137–147)

## 2014-01-21 LAB — TROPONIN I: TROPONIN I: 0.55 ng/mL — AB (ref <0.30)

## 2014-01-21 LAB — TSH: TSH: 1.83 u[IU]/mL (ref 0.350–4.500)

## 2014-01-21 LAB — PRO B NATRIURETIC PEPTIDE: Pro B Natriuretic peptide (BNP): 1419 pg/mL — ABNORMAL HIGH (ref 0–125)

## 2014-01-21 LAB — PROTIME-INR
INR: 1.1 (ref 0.00–1.49)
PROTHROMBIN TIME: 14.4 s (ref 11.6–15.2)

## 2014-01-21 LAB — HEPARIN LEVEL (UNFRACTIONATED): Heparin Unfractionated: <0.1 IU/mL — ABNORMAL LOW (ref 0.30–0.70)

## 2014-01-21 MED ORDER — ZOLPIDEM TARTRATE 5 MG PO TABS
5.0000 mg | ORAL_TABLET | Freq: Every evening | ORAL | Status: DC | PRN
  Filled 2014-01-21: qty 1

## 2014-01-21 MED ORDER — ACETAMINOPHEN 325 MG PO TABS: 650.0000 mg | ORAL_TABLET | ORAL | Status: DC | PRN

## 2014-01-21 MED ORDER — VALERIAN 500 MG PO CAPS: 2.0000 | ORAL_CAPSULE | Freq: Every day | ORAL | Status: DC

## 2014-01-21 MED ORDER — ADULT MULTIVITAMIN W/MINERALS CH
1.0000 | ORAL_TABLET | Freq: Every day | ORAL | Status: DC
Start: 2014-01-21 — End: 2014-01-27
  Administered 2014-01-21 – 2014-01-26 (×6): 1 via ORAL
  Filled 2014-01-21 (×7): qty 1

## 2014-01-21 MED ORDER — LABETALOL HCL 5 MG/ML IV SOLN
10.0000 mg | Freq: Once | INTRAVENOUS | Status: AC
  Administered 2014-01-21: 10 mg via INTRAVENOUS
  Filled 2014-01-21: qty 4

## 2014-01-21 MED ORDER — ENALAPRIL MALEATE 10 MG PO TABS
10.0000 mg | ORAL_TABLET | Freq: Every day | ORAL | Status: DC
  Administered 2014-01-21 – 2014-01-24 (×4): 10 mg via ORAL
  Filled 2014-01-21 (×5): qty 1

## 2014-01-21 MED ORDER — OMEGA-3-ACID ETHYL ESTERS 1 G PO CAPS
1.0000 g | ORAL_CAPSULE | Freq: Two times a day (BID) | ORAL | Status: DC
  Administered 2014-01-21 – 2014-01-23 (×5): 1 g via ORAL
  Filled 2014-01-21 (×7): qty 1

## 2014-01-21 MED ORDER — CLOPIDOGREL BISULFATE 75 MG PO TABS
75.0000 mg | ORAL_TABLET | Freq: Every day | ORAL | Status: DC
  Administered 2014-01-21 – 2014-01-23 (×3): 75 mg via ORAL
  Filled 2014-01-21 (×3): qty 1

## 2014-01-21 MED ORDER — ASPIRIN 81 MG PO CHEW
324.0000 mg | CHEWABLE_TABLET | Freq: Once | ORAL | Status: AC
  Administered 2014-01-21: 324 mg via ORAL
  Filled 2014-01-21: qty 4

## 2014-01-21 MED ORDER — CARVEDILOL 6.25 MG PO TABS
6.2500 mg | ORAL_TABLET | Freq: Two times a day (BID) | ORAL | Status: DC
  Administered 2014-01-21 – 2014-01-27 (×12): 6.25 mg via ORAL
  Filled 2014-01-21 (×16): qty 1

## 2014-01-21 MED ORDER — FUROSEMIDE 10 MG/ML IJ SOLN: 40.0000 mg | Freq: Two times a day (BID) | INTRAMUSCULAR | Status: DC

## 2014-01-21 MED ORDER — OMEGA 3 1200 MG PO CAPS: 1.0000 | ORAL_CAPSULE | Freq: Every day | ORAL | Status: DC

## 2014-01-21 MED ORDER — MULTIVITAMINS PO CAPS: 1.0000 | ORAL_CAPSULE | Freq: Every day | ORAL | Status: DC

## 2014-01-21 MED ORDER — FUROSEMIDE 10 MG/ML IJ SOLN
40.0000 mg | Freq: Every day | INTRAMUSCULAR | Status: DC
  Administered 2014-01-21: 40 mg via INTRAVENOUS
  Filled 2014-01-21 (×2): qty 4

## 2014-01-21 MED ORDER — NITROGLYCERIN 0.4 MG SL SUBL: 0.4000 mg | SUBLINGUAL_TABLET | SUBLINGUAL | Status: DC | PRN

## 2014-01-21 MED ORDER — ASPIRIN 81 MG PO TABS
81.0000 mg | ORAL_TABLET | Freq: Every day | ORAL | Status: DC
  Filled 2014-01-21: qty 1

## 2014-01-21 MED ORDER — ONDANSETRON HCL 4 MG/2ML IJ SOLN: 4.0000 mg | Freq: Four times a day (QID) | INTRAMUSCULAR | Status: DC | PRN

## 2014-01-21 MED ORDER — COLCHICINE 0.6 MG PO TABS
0.6000 mg | ORAL_TABLET | Freq: Two times a day (BID) | ORAL | Status: DC
  Administered 2014-01-21 – 2014-01-24 (×3): 0.6 mg via ORAL
  Filled 2014-01-21 (×10): qty 1

## 2014-01-21 MED ORDER — HEPARIN (PORCINE) IN NACL 100-0.45 UNIT/ML-% IJ SOLN
1900.0000 [IU]/h | INTRAMUSCULAR | Status: DC
  Administered 2014-01-21: 1200 [IU]/h via INTRAVENOUS
  Administered 2014-01-22: 1500 [IU]/h via INTRAVENOUS
  Administered 2014-01-23: 1900 [IU]/h via INTRAVENOUS
  Filled 2014-01-21 (×7): qty 250

## 2014-01-21 MED ORDER — HEPARIN BOLUS VIA INFUSION
4000.0000 [IU] | Freq: Once | INTRAVENOUS | Status: AC
  Administered 2014-01-21: 4000 [IU] via INTRAVENOUS
  Filled 2014-01-21: qty 4000

## 2014-01-21 MED ORDER — METOPROLOL TARTRATE 1 MG/ML IV SOLN
2.5000 mg | INTRAVENOUS | Status: DC | PRN
  Administered 2014-01-21 – 2014-01-25 (×5): 2.5 mg via INTRAVENOUS
  Filled 2014-01-21 (×5): qty 5

## 2014-01-21 MED ORDER — L-TRYPTOPHAN 500 MG PO CAPS: 1000.0000 mg | ORAL_CAPSULE | Freq: Every day | ORAL | Status: DC

## 2014-01-21 MED ORDER — MELATONIN 5 MG PO TABS: 5.0000 mg | ORAL_TABLET | Freq: Every day | ORAL | Status: DC

## 2014-01-21 MED ORDER — ATORVASTATIN CALCIUM 40 MG PO TABS
40.0000 mg | ORAL_TABLET | Freq: Every day | ORAL | Status: DC
  Filled 2014-01-21 (×2): qty 1

## 2014-01-21 MED ORDER — ALPRAZOLAM 0.25 MG PO TABS
0.2500 mg | ORAL_TABLET | Freq: Two times a day (BID) | ORAL | Status: DC | PRN
  Administered 2014-01-23 (×2): 0.25 mg via ORAL
  Filled 2014-01-21 (×2): qty 1

## 2014-01-21 MED ORDER — FUROSEMIDE 10 MG/ML IJ SOLN
40.0000 mg | Freq: Once | INTRAMUSCULAR | Status: AC
  Administered 2014-01-21: 40 mg via INTRAVENOUS
  Filled 2014-01-21: qty 4

## 2014-01-21 MED ORDER — HEPARIN BOLUS VIA INFUSION
2600.0000 [IU] | Freq: Once | INTRAVENOUS | Status: AC
  Administered 2014-01-21: 2600 [IU] via INTRAVENOUS
  Filled 2014-01-21: qty 2600

## 2014-01-21 MED ORDER — ASPIRIN EC 81 MG PO TBEC
81.0000 mg | DELAYED_RELEASE_TABLET | Freq: Every day | ORAL | Status: DC
  Administered 2014-01-22 – 2014-01-26 (×4): 81 mg via ORAL
  Filled 2014-01-21 (×6): qty 1

## 2014-01-21 NOTE — ED Provider Notes (Signed)
CSN: [id number]     Arrival date & time 01/21/14  0457 History   First MD Initiated Contact with Patient 01/21/14 [phone]     Chief Complaint  Patient presents with  . Chest Pain     (Consider location/radiation/quality/duration/timing/severity/associated sxs/prior Treatment) Patient is a 63 y.o. male presenting with hypertension. The history is provided by the patient.  Hypertension This is a recurrent problem. The current episode started 6 to 12 hours ago. The problem occurs constantly. The problem has not changed since onset.Associated symptoms include chest pain and shortness of breath. Pertinent negatives include no abdominal pain. Associated symptoms comments: Orthopnea and PND and chest pressure. Nothing aggravates the symptoms. Nothing relieves the symptoms. He has tried nothing for the symptoms. The treatment provided no relief.  Patient feels that symptoms are related entirely to dose of prednisone taken this evening and nothing else.  He had some CHF on XRay taken 2 weeks ago and has seen Dr. [name] but has not had echo   Past Medical History  Diagnosis Date  . Hypertension   . Gout    Past Surgical History  Procedure Laterality Date  . Knee surgery     Family History  Problem Relation Age of Onset  . Cancer Mother   . Cancer Father    History  Substance Use Topics  . Smoking status: Never Smoker   . Smokeless tobacco: Never Used  . Alcohol Use: 1.8 - 2.4 oz/week    3-4 Not specified per week    Review of Systems  Constitutional: Negative for fever.  Respiratory: Positive for cough, chest tightness and shortness of breath. Negative for wheezing.   Cardiovascular: Positive for chest pain. Negative for palpitations.  Gastrointestinal: Negative for abdominal pain.  All other systems reviewed and are negative.     Allergies  Penicillins  Home Medications   Prior to Admission medications   Medication Sig Start Date End Date Taking? Authorizing Provider   aspirin 81 MG tablet Take 81 mg by mouth daily.    Historical Provider, MD  carvedilol (COREG) 3.125 MG tablet Take 1 tablet (3.125 mg total) by mouth 2 (two) times daily with a meal. 01/13/14   [name], MD  Cholecalciferol (VITAMIN D-3) 5000 UNITS TABS Take by mouth daily.    Historical Provider, MD  enalapril (VASOTEC) 10 MG tablet Take 0.5 tablets (5 mg total) by mouth daily. 01/13/14   [name], MD  furosemide (LASIX) 20 MG tablet Take 1 tablet (20 mg total) by mouth daily. 01/13/14   [name], MD  L-Tryptophan 500 MG CAPS Take 1,000 mg by mouth daily.    Historical Provider, MD  Melatonin 5 MG TABS Take by mouth at bedtime.    Historical Provider, MD  Multiple Vitamin (MULTIVITAMIN) capsule Take 1 capsule by mouth daily.    Historical Provider, MD  Nutritional Supplements (GRAPESEED EXTRACT PO) Take by mouth daily. Resveratrol    Historical Provider, MD  Omega-3 Fatty Acids (OMEGA 3 PO) Take 1,280 mg by mouth daily.    Historical Provider, MD  predniSONE (DELTASONE) 20 MG tablet 3 tabs po day one, then 2 tabs daily x 4 days 01/20/14   Babette Relic, MD  Valerian 500 MG CAPS Take 2 capsules by mouth daily.    Historical Provider, MD   BP 160/108 mmHg  Pulse 117  Temp(Src) 97.9 F (36.6 C) (Oral)  Resp 16  Ht 5\' 8"  (1.727 m)  Wt 200 lb (90.719 kg)  BMI 30.42 kg/m2  SpO2 96% Physical Exam  Constitutional: He appears well-developed and well-nourished. No distress.  HENT:  Head: Normocephalic and atraumatic.  Mouth/Throat: Oropharynx is clear and moist.  Eyes: Conjunctivae are normal. Pupils are equal, round, and reactive to light.  Neck: Normal range of motion. Neck supple.  Cardiovascular: Normal rate, regular rhythm and intact distal pulses.   Pulmonary/Chest: He has rales.  Abdominal: Soft. Bowel sounds are normal. There is no tenderness. There is no rebound and no guarding.  Musculoskeletal: Normal range of motion. He exhibits edema.  Skin: Skin is  warm and dry.  Psychiatric: He has a normal mood and affect.    ED Course  Procedures (including critical care time) Labs Review Labs Reviewed  CBC WITH DIFFERENTIAL - Abnormal; Notable for the following:    Platelets 405 (*)    Neutrophils Relative % 90 (*)    Lymphocytes Relative 7 (*)    Lymphs Abs 0.5 (*)    All other components within normal limits  BASIC METABOLIC PANEL - Abnormal; Notable for the following:    Glucose, Bld 200 (*)    Calcium 10.7 (*)    Anion gap 17 (*)    All other components within normal limits  TROPONIN I  PRO B NATRIURETIC PEPTIDE    Imaging Review Dg Chest 2 View  01/21/2014   CLINICAL DATA:  Chest discomfort and high blood pressure after starting on prednisone yesterday for gout. New onset cough.  EXAM: CHEST  2 VIEW  COMPARISON:  01/09/2014  FINDINGS: Cardiac enlargement with interstitial and perihilar airspace infiltration suggesting edema or pneumonia. Perihilar infiltrates have increased since previous study. No blunting of costophrenic angles. No pneumothorax.  IMPRESSION: Increasing perihilar infiltrates since previous study suggesting progression of edema or pneumonia.   Electronically Signed   By: Lucienne Capers M.D.   On: 01/21/2014 05:45     EKG Interpretation   Date/Time:  Saturday January 21 2014 05:09:30 EST Ventricular Rate:  115 PR Interval:  160 QRS Duration: 92 QT Interval:  338 QTC Calculation: 467 R Axis:   55 Text Interpretation:  Sinus tachycardia Possible Left atrial enlargement  Left ventricular hypertrophy with repolarization abnormality Confirmed by  [facility]  MD, Tarrie Mcmichen ([id number]) on 01/21/2014 5:24:42 AM      MDM   Final diagnoses:  Pain   Medications  aspirin chewable tablet 324 mg (324 mg Oral Given 01/21/14 0553)  labetalol (NORMODYNE,TRANDATE) injection 10 mg (10 mg Intravenous Given 01/21/14 0601)  furosemide (LASIX) injection 40 mg (40 mg Intravenous Given 01/21/14 0555)    Was in CHF on the  9th.  Scheduled for echo in December will need to come in as meds are not working  Case d/w fellow, patient needs admit call back if patient will stay   Patient accepted by Dr. Sallyanne Kuster with cardiology admit to tele  Sophi Calligan K Nadalee Neiswender-Rasch, MD 01/21/14 [phone]

## 2014-01-21 NOTE — ED Notes (Addendum)
Was seen here yesterday for gout  Started on prednisone,  state was since taking prednisone bp has gone up,  States feels like lungs are feeling up

## 2014-01-21 NOTE — Progress Notes (Addendum)
CRITICAL VALUE ALERT  Critical value received:  Troponin >20  Date of notification:  01/21/14  Time of notification:  2500  Critical value read back:Yes.    Nurse who received alert:  Jake Bathe, RN   MD notified (1st page):  Suanne Marker, [place]  Time of first page:  1355  MD notified (2nd page):  Time of second page:  Responding MD:  Suanne Marker, [place]  Time MD responded:  1356  150/95 BP 102 HR

## 2014-01-21 NOTE — H&P (Signed)
History and Physical   Patient ID: Derek [name] MRN: [id number], DOB/AGE: 03-20-1950 63 y.o. Date of Encounter: 01/21/2014  Primary Physician: No PCP Per Patient Primary Cardiologist: Dr. [name]  Chief Complaint:  elevated ez, CHF  HPI: Derek [name] is a 63 y.o. male with no history of CAD. He was referred to Dr. [name] is an outpatient for hypertension and CHF. He was seen on 11/13, there was concern for hypertensive cardiomyopathy and echocardiogram was scheduled.  Derek [name] has a history of gout but has not had a flare noted for 2 years. He takes no medications for it. He is compliant with the antihypertensives he was started on.  Yesterday, he felt that he was getting a gout flare in his foot. He stopped by [facility] [place] and was given a prescription for prednisone. He took his first dose approximately 6 PM yesterday.  He woke at midnight with cough with white sputum and extreme restlessness. He denies chest pain and does not believe he was short of breath at rest. He had no palpitations. He went to [facility]. His initial blood pressure was 196/118. He was given aspirin 324 mg, Lasix 40 mg IV and IV labetalol His symptoms are significantly improved, but he is still tachycardic. His cardiac enzymes were mildly elevated, but he has never had chest pain.  He has never had prednisone before. He had a cough prior to seeking medical care, but it improved after being started on blood pressure medications including Lasix 20 mg daily.  The cough was numbers bad as it was last night. He denies any history of weight gain, lower extremity edema, orthopnea or PND. He has no bleeding issues. He has no other recent illnesses, fevers or chills. He currently feels well.   Past Medical History  Diagnosis Date  . Hypertension   . Gout     Surgical History:  Past Surgical History  Procedure Laterality Date  . Knee surgery      I have reviewed the patient's current  medications. Prior to Admission medications   Medication Sig Start Date End Date Taking? Authorizing Provider  aspirin 81 MG tablet Take 81 mg by mouth daily.    Historical Provider, MD  carvedilol (COREG) 3.125 MG tablet Take 1 tablet (3.125 mg total) by mouth 2 (two) times daily with a meal. 01/13/14   [name], MD  Cholecalciferol (VITAMIN D-3) 5000 UNITS TABS Take by mouth daily.    Historical Provider, MD  enalapril (VASOTEC) 10 MG tablet Take 0.5 tablets (5 mg total) by mouth daily. 01/13/14   [name], MD  furosemide (LASIX) 20 MG tablet Take 1 tablet (20 mg total) by mouth daily. 01/13/14   [name], MD  L-Tryptophan 500 MG CAPS Take 1,000 mg by mouth daily.    Historical Provider, MD  Melatonin 5 MG TABS Take by mouth at bedtime.    Historical Provider, MD  Multiple Vitamin (MULTIVITAMIN) capsule Take 1 capsule by mouth daily.    Historical Provider, MD  Nutritional Supplements (GRAPESEED EXTRACT PO) Take by mouth daily. Resveratrol    Historical Provider, MD  Omega-3 Fatty Acids (OMEGA 3 PO) Take 1,280 mg by mouth daily.    Historical Provider, MD  predniSONE (DELTASONE) 20 MG tablet 3 tabs po day one, then 2 tabs daily x 4 days 01/20/14   Babette Relic, MD  Valerian 500 MG CAPS Take 2 capsules by mouth daily.  Historical Provider, MD   Allergies:  Allergies  Allergen Reactions  . Penicillins Anaphylaxis    History   Social History  . Marital Status: Married    Spouse Name: N/A    Number of Children: N/A  . Years of Education: N/A   Occupational History  . Dentist    Social History Main Topics  . Smoking status: Never Smoker   . Smokeless tobacco: Never Used  . Alcohol Use: Yes     Comment: 1-2 drinks/week  . Drug Use: No  . Sexual Activity: Not on file   Other Topics Concern  . Not on file   Social History Narrative   Lives with wife    Family History  Problem Relation Age of Onset  . Cancer Mother   . Cancer Father    Family  Status  Relation Status Death Age  . Mother Deceased   . Father Deceased     Review of Systems:   Full 14-point review of systems otherwise negative except as noted above.  Physical Exam: Blood pressure 167/93, pulse 91, temperature 98.7 F (37.1 C), temperature source Oral, resp. rate 16, height 5\' 8"  (1.727 m), weight 209 lb 12.8 oz (95.165 kg), SpO2 98 %. General: Well developed, well nourished,male in no acute distress. Head: Normocephalic, atraumatic, sclera non-icteric, no xanthomas, nares are without discharge. Dentition:  Neck: No carotid bruits. JVD elevated at 9 cm. No thyromegally Lungs: Good expansion bilaterally. without wheezes or rhonchi.  Heart: Rapid Regular rate and rhythm with S1 S2.  No S3 or S4.  No murmur, no rubs, or gallops appreciated. Abdomen: Soft, non-tender, non-distended with normoactive bowel sounds. No hepatomegaly. No rebound/guarding. No obvious abdominal masses. Msk:  Strength and tone appear normal for age. No joint deformities or effusions, no spine or costo-vertebral angle tenderness. Extremities: No clubbing or cyanosis. No edema.  Distal pedal pulses are 2+ in 4 extrem Neuro: Alert and oriented X 3. Moves all extremities spontaneously. No focal deficits noted. Psych:  Responds to questions appropriately with a normal affect. Skin: No rashes or lesions noted  Labs:   Lab Results  Component Value Date   WBC 6.9 01/21/2014   HGB 13.6 01/21/2014   HCT 42.0 01/21/2014   MCV 84.2 01/21/2014   PLT 405* 01/21/2014     Recent Labs Lab 01/21/14 0521  NA 140  K 4.5  CL 100  CO2 23  BUN 19  CREATININE 0.80  CALCIUM 10.7*  GLUCOSE 200*    Recent Labs  01/21/14 0521  TROPONINI 0.55*   PRO B NATRIURETIC PEPTIDE (BNP)  Date/Time Value Ref Range Status  01/21/2014 05:21 AM 1419.0* 0 - 125 pg/mL Final  01/09/2014 10:39 AM 1971.0* 0 - 125 pg/mL Final   Radiology/Studies: Dg Chest 2 View 01/21/2014   CLINICAL DATA:  Chest discomfort and  high blood pressure after starting on prednisone yesterday for gout. New onset cough.  EXAM: CHEST  2 VIEW  COMPARISON:  01/09/2014  FINDINGS: Cardiac enlargement with interstitial and perihilar airspace infiltration suggesting edema or pneumonia. Perihilar infiltrates have increased since previous study. No blunting of costophrenic angles. No pneumothorax.  IMPRESSION: Increasing perihilar infiltrates since previous study suggesting progression of edema or pneumonia.   Electronically Signed   By: Lucienne Capers M.D.   On: 01/21/2014 05:45   ECG: Sinus tachycardia, diffuse ST changes felt secondary to LVH  ASSESSMENT AND PLAN:  Active Problems:   CHF (congestive heart failure) - admit, diurese, check 2-D echocardiogram, follow  renal function carefully plus strict I/O and daily weights.  Elevated troponin - continue to cycle enzymes, review echo. M.D. to assess data and advise if cardiac catheterization or stress testing indicated. May need right/left heart cath on Monday.  Gout - patient does not want to take any more steroids. Agree with this. We'll start colchicine 0.6 mg twice a day  Jonetta Speak, PA-C 01/21/2014 10:11 AM Beeper [phone]  As above, patient seen and examined. Patient has had recent onset of congestive heart failure symptoms. He also has had elevated blood pressure. His symptoms improved with the addition of medications. However he took a dose of prednisone last evening and subsequently became agitated. His blood pressure increased and he became dyspneic with increased cough. He then developed epigastric pain described as "indigestion". He was seen at the [place] emergency room and troponin elevated. Transferred for further management. Electrocardiogram shows sinus rhythm, left ventricular hypertrophy with repolarization abnormality. Plan to admit and continue cycling enzymes. Check echocardiogram for LV function. Gently diurese with Lasix 40 mg IV daily. Follow  renal function. 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 Treatment with aspirin, heparin, statin, beta blocker and ACE inhibitor. Increase medications as needed for blood pressure control. He will require cardiac catheterization on Monday. The risks and benefits were discussed and he agrees to proceed. Check TSH. Kirk Ruths

## 2014-01-21 NOTE — Progress Notes (Signed)
ANTICOAGULATION CONSULT NOTE - Initial Consult  Pharmacy Consult for Heparin  Indication: ACS /STEMI  Allergies  Allergen Reactions  . Penicillins Anaphylaxis    Patient Measurements: Height: 5\' 8"  (172.7 cm) Weight: 209 lb 12.8 oz (95.165 kg) IBW/kg (Calculated) : 68.4 Heparin Dosing Weight: 88.4 kg  Vital Signs: Temp: 98.7 F (37.1 C) (11/21 1430) Temp Source: Oral (11/21 1430) BP: 127/79 mmHg (11/21 1703) Pulse Rate: 81 (11/21 1703)  Labs:  Recent Labs  01/21/14 0521 01/21/14 1255 01/21/14 1814 01/21/14 2015  HGB 13.6  --   --   --   HCT 42.0  --   --   --   PLT 405*  --   --   --   LABPROT  --  14.4  --   --   INR  --  1.10  --   --   HEPARINUNFRC  --   --   --  <0.10*  CREATININE 0.80  --   --   --   TROPONINI 0.55* >20.00* >20.00*  --     Estimated Creatinine Clearance: 103 mL/min (by C-G formula based on Cr of 0.8).   Medical History: Past Medical History  Diagnosis Date  . Hypertension   . Gout   . NSTEMI (non-ST elevated myocardial infarction)      Medications:  Prescriptions prior to admission  Medication Sig Dispense Refill Last Dose  . aspirin 81 MG tablet Take 81 mg by mouth daily.   Taking  . carvedilol (COREG) 3.125 MG tablet Take 1 tablet (3.125 mg total) by mouth 2 (two) times daily with a meal. 180 tablet 1   . Cholecalciferol (VITAMIN D-3) 5000 UNITS TABS Take by mouth daily.   Taking  . enalapril (VASOTEC) 10 MG tablet Take 0.5 tablets (5 mg total) by mouth daily. 90 tablet 1   . furosemide (LASIX) 20 MG tablet Take 1 tablet (20 mg total) by mouth daily. 90 tablet 1   . L-Tryptophan 500 MG CAPS Take 1,000 mg by mouth daily.   Taking  . Melatonin 5 MG TABS Take by mouth at bedtime.   Taking  . Multiple Vitamin (MULTIVITAMIN) capsule Take 1 capsule by mouth daily.   Taking  . Nutritional Supplements (GRAPESEED EXTRACT PO) Take by mouth daily. Resveratrol   Taking  . Omega-3 Fatty Acids (OMEGA 3 PO) Take 1,280 mg by mouth daily.    Taking  . Valerian 500 MG CAPS Take 2 capsules by mouth daily.   Taking  . [DISCONTINUED] predniSONE (DELTASONE) 20 MG tablet 3 tabs po day one, then 2 tabs daily x 4 days 11 tablet 0       Scheduled:  . [START ON 01/22/2014] aspirin EC  81 mg Oral Daily  . atorvastatin  40 mg Oral q1800  . carvedilol  6.25 mg Oral BID WC  . clopidogrel  75 mg Oral Daily  . colchicine  0.6 mg Oral BID  . enalapril  10 mg Oral Daily  . furosemide  40 mg Intravenous Daily  . multivitamin with minerals  1 tablet Oral Daily  . omega-3 acid ethyl esters  1 g Oral BID    Assessment: 63 y.o male history HTN and gout, recently started diuretic for HTN He developed typical flareup of gout 4 days ago in his left ankle with constant localized nonradiating pain with occasional redness and swelling to the ankle but his ankle is not red or swollen today, he has no fever no pain in his calf  or thigh, no chest pain or shortness of breath.  He was recently seen in the emergency department for new onset heart failure with elevated blood pressure. Pt had not seen a physician in over 30 years prior to his last ED visit. Not on anticoagulation PTA.  Troponin elevated at 0.55.  BNP high 1419,  H/H 13.6/42 and pltc 405K.  Now to start on IV heparin infusion for ACS/STEMI  Initial heparin level is undetectable on heparin 1200 units/hr. There are no reports of lost IV access or bleeding.  Goal of Therapy:  Heparin level 0.3-0.7 units/ml Monitor platelets by anticoagulation protocol: Yes   Plan:  Heparin bolus 2600 units IV x 1 Heparin drip 1500 units/hr Heparin level in 6 hours then daily heparin level and CBC  Andrey Cota. Diona Foley, PharmD Clinical Pharmacist Pager [phone]  01/21/2014,9:00 PM

## 2014-01-21 NOTE — ED Notes (Addendum)
C/o chest discomfort and high bp after starting on prednisone yesterday for gout,  New onset of cough

## 2014-01-21 NOTE — Progress Notes (Signed)
ANTICOAGULATION CONSULT NOTE - Initial Consult  Pharmacy Consult for Heparin  Indication: ACS /STEMI  Allergies  Allergen Reactions  . Penicillins Anaphylaxis    Patient Measurements: Height: 5\' 8"  (172.7 cm) Weight: 209 lb 12.8 oz (95.165 kg) IBW/kg (Calculated) : 68.4 Heparin Dosing Weight: 88.4 kg  Vital Signs: Temp: 98.7 F (37.1 C) (11/21 0930) Temp Source: Oral (11/21 0930) BP: 167/93 mmHg (11/21 0930) Pulse Rate: 91 (11/21 0930)  Labs:  Recent Labs  01/21/14 0521  HGB 13.6  HCT 42.0  PLT 405*  CREATININE 0.80  TROPONINI 0.55*    Estimated Creatinine Clearance: 103 mL/min (by C-G formula based on Cr of 0.8).   Medical History: Past Medical History  Diagnosis Date  . Hypertension   . Gout   . NSTEMI (non-ST elevated myocardial infarction)      Medications:  Prescriptions prior to admission  Medication Sig Dispense Refill Last Dose  . aspirin 81 MG tablet Take 81 mg by mouth daily.   Taking  . carvedilol (COREG) 3.125 MG tablet Take 1 tablet (3.125 mg total) by mouth 2 (two) times daily with a meal. 180 tablet 1   . Cholecalciferol (VITAMIN D-3) 5000 UNITS TABS Take by mouth daily.   Taking  . enalapril (VASOTEC) 10 MG tablet Take 0.5 tablets (5 mg total) by mouth daily. 90 tablet 1   . furosemide (LASIX) 20 MG tablet Take 1 tablet (20 mg total) by mouth daily. 90 tablet 1   . L-Tryptophan 500 MG CAPS Take 1,000 mg by mouth daily.   Taking  . Melatonin 5 MG TABS Take by mouth at bedtime.   Taking  . Multiple Vitamin (MULTIVITAMIN) capsule Take 1 capsule by mouth daily.   Taking  . Nutritional Supplements (GRAPESEED EXTRACT PO) Take by mouth daily. Resveratrol   Taking  . Omega-3 Fatty Acids (OMEGA 3 PO) Take 1,280 mg by mouth daily.   Taking  . Valerian 500 MG CAPS Take 2 capsules by mouth daily.   Taking  . [DISCONTINUED] predniSONE (DELTASONE) 20 MG tablet 3 tabs po day one, then 2 tabs daily x 4 days 11 tablet 0       Scheduled:  . [START ON  01/22/2014] aspirin EC  81 mg Oral Daily  . carvedilol  6.25 mg Oral BID WC  . colchicine  0.6 mg Oral BID  . enalapril  10 mg Oral Daily  . furosemide  40 mg Intravenous BID  . multivitamin with minerals  1 tablet Oral Daily  . omega-3 acid ethyl esters  1 g Oral BID    Assessment: 63 y.o male history HTN and gout, recently started diuretic for HTN He developed typical flareup of gout 4 days ago in his left ankle with constant localized nonradiating pain with occasional redness and swelling to the ankle but his ankle is not red or swollen today, he has no fever no pain in his calf or thigh, no chest pain or shortness of breath.  He was recently seen in the emergency department for new onset heart failure with elevated blood pressure. Pt had not seen a physician in over 30 years prior to his last ED visit. Not on anticoagulation PTA.  Troponin elevated at 0.55.  BNP high 1419,  H/H 13.6/42 and pltc 405K.  Now to start on IV heparin infusion for ACS/STEMI  Goal of Therapy:  Heparin level 0.3-0.7 units/ml Monitor platelets by anticoagulation protocol: Yes   Plan:  Heparin bolus 4000 units IV x 1 Heparin  drip 1200 units/hr Heparin level in 6 hours then daily heparin level and CBC  Nicole Cella, RPh Clinical Pharmacist Pager: [phone] 01/21/2014,12:17 PM

## 2014-01-22 DIAGNOSIS — I059 Rheumatic mitral valve disease, unspecified: Secondary | ICD-10-CM

## 2014-01-22 DIAGNOSIS — I214 Non-ST elevation (NSTEMI) myocardial infarction: Principal | ICD-10-CM

## 2014-01-22 LAB — COMPREHENSIVE METABOLIC PANEL
ALT: 29 U/L (ref 0–53)
AST: 76 U/L — ABNORMAL HIGH (ref 0–37)
Albumin: 3.2 g/dL — ABNORMAL LOW (ref 3.5–5.2)
Alkaline Phosphatase: 68 U/L (ref 39–117)
Anion gap: 14 (ref 5–15)
BUN: 19 mg/dL (ref 6–23)
CALCIUM: 10 mg/dL (ref 8.4–10.5)
CO2: 29 mEq/L (ref 19–32)
CREATININE: 1.07 mg/dL (ref 0.50–1.35)
Chloride: 97 mEq/L (ref 96–112)
GFR, EST AFRICAN AMERICAN: 82 mL/min — AB (ref >90)
GFR, EST NON AFRICAN AMERICAN: 71 mL/min — AB (ref >90)
GLUCOSE: 116 mg/dL — AB (ref 70–99)
Potassium: 4.3 mEq/L (ref 3.7–5.3)
Sodium: 140 mEq/L (ref 137–147)
TOTAL PROTEIN: 7.2 g/dL (ref 6.0–8.3)
Total Bilirubin: 0.6 mg/dL (ref 0.3–1.2)

## 2014-01-22 LAB — HEPARIN LEVEL (UNFRACTIONATED)
HEPARIN UNFRACTIONATED: 0.24 [IU]/mL — AB (ref 0.30–0.70)
Heparin Unfractionated: 0.34 IU/mL (ref 0.30–0.70)
Heparin Unfractionated: 0.25 IU/mL — ABNORMAL LOW (ref 0.30–0.70)

## 2014-01-22 LAB — CBC
HCT: 40.5 % (ref 39.0–52.0)
HEMOGLOBIN: 13.1 g/dL (ref 13.0–17.0)
MCH: 27.8 pg (ref 26.0–34.0)
MCHC: 32.3 g/dL (ref 30.0–36.0)
MCV: 86 fL (ref 78.0–100.0)
Platelets: 380 10*3/uL (ref 150–400)
RBC: 4.71 MIL/uL (ref 4.22–5.81)
RDW: 14.3 % (ref 11.5–15.5)
WBC: 7.3 10*3/uL (ref 4.0–10.5)

## 2014-01-22 LAB — TROPONIN I: TROPONIN I: 14.37 ng/mL — AB (ref <0.30)

## 2014-01-22 MED ORDER — FUROSEMIDE 10 MG/ML IJ SOLN
20.0000 mg | Freq: Every day | INTRAMUSCULAR | Status: DC
Start: 2014-01-22 — End: 2014-01-23
  Administered 2014-01-22: 20 mg via INTRAVENOUS
  Filled 2014-01-22: qty 2

## 2014-01-22 MED ORDER — ATORVASTATIN CALCIUM 80 MG PO TABS
80.0000 mg | ORAL_TABLET | Freq: Every day | ORAL | Status: DC
  Administered 2014-01-24 – 2014-01-31 (×7): 80 mg via ORAL
  Filled 2014-01-22 (×11): qty 1

## 2014-01-22 MED ORDER — SODIUM CHLORIDE 0.9 % IJ SOLN
3.0000 mL | Freq: Two times a day (BID) | INTRAMUSCULAR | Status: DC
  Administered 2014-01-23: 3 mL via INTRAVENOUS

## 2014-01-22 MED ORDER — ASPIRIN 81 MG PO CHEW
81.0000 mg | CHEWABLE_TABLET | ORAL | Status: AC
  Administered 2014-01-23: 81 mg via ORAL
  Filled 2014-01-22: qty 1

## 2014-01-22 MED ORDER — SODIUM CHLORIDE 0.9 % IV SOLN
INTRAVENOUS | Status: DC
  Administered 2014-01-23: 10 mL via INTRAVENOUS

## 2014-01-22 MED ORDER — SODIUM CHLORIDE 0.9 % IV SOLN: 250.0000 mL | INTRAVENOUS | Status: DC | PRN

## 2014-01-22 MED ORDER — SODIUM CHLORIDE 0.9 % IJ SOLN: 3.0000 mL | INTRAMUSCULAR | Status: DC | PRN

## 2014-01-22 NOTE — Progress Notes (Signed)
  Echocardiogram 2D Echocardiogram has been performed.  [name] 01/22/2014, 1:57 PM

## 2014-01-22 NOTE — Progress Notes (Signed)
Utilization Review Completed.Shiela Bruns T11/22/2015  

## 2014-01-22 NOTE — Progress Notes (Signed)
ANTICOAGULATION CONSULT NOTE - Follow Up Consult  Pharmacy Consult for Heparin Indication: chest pain/ACS  Allergies  Allergen Reactions  . Penicillins Anaphylaxis    Patient Measurements: Height: 5\' 8"  (172.7 cm) Weight: 204 lb 11.2 oz (92.851 kg) IBW/kg (Calculated) : 68.4 Heparin Dosing Weight: 88.4 kg  Vital Signs: Temp: 97.6 F (36.4 C) (11/22 0509) Temp Source: Oral (11/22 0509) BP: 119/71 mmHg (11/22 0820) Pulse Rate: 78 (11/22 0820)  Labs:  Recent Labs  01/21/14 0521 01/21/14 1255 01/21/14 1814 01/21/14 2015 01/22/14 0042 01/22/14 1240  HGB 13.6  --   --   --  13.1  --   HCT 42.0  --   --   --  40.5  --   PLT 405*  --   --   --  380  --   LABPROT  --  14.4  --   --   --   --   INR  --  1.10  --   --   --   --   HEPARINUNFRC  --   --   --  <0.10* 0.34 0.24*  CREATININE 0.80  --   --   --  1.07  --   TROPONINI 0.55* >20.00* >20.00*  --  14.37*  --     Estimated Creatinine Clearance: 76.1 mL/min (by C-G formula based on Cr of 1.07).   Medications:  Scheduled:  . [START ON 01/23/2014] aspirin  81 mg Oral Pre-Cath  . aspirin EC  81 mg Oral Daily  . atorvastatin  80 mg Oral q1800  . carvedilol  6.25 mg Oral BID WC  . clopidogrel  75 mg Oral Daily  . colchicine  0.6 mg Oral BID  . enalapril  10 mg Oral Daily  . furosemide  20 mg Intravenous Daily  . multivitamin with minerals  1 tablet Oral Daily  . omega-3 acid ethyl esters  1 g Oral BID   Infusions:  . heparin 1,500 Units/hr (01/22/14 0154)    Assessment: 63 yo M presented to ED 11/22 with CP and found to have positive troponin.  Pt was started on heparin with plans for cardiac cath Monday 11/23.  Heparin level is subtherapeutic on 1500 units/hr.  No IV issues noted.  Will adjust rate accordingly.  Goal of Therapy:  Heparin level 0.3-0.7 units/ml Monitor platelets by anticoagulation protocol: Yes   Plan:  Increase heparin infusion to 1700 units/hr. Heparin level in 6 hours. Heparin level  and CBC daily while on heparin. Follow-up plans for cardiac cath.  [employer], Pharm.D., BCPS Clinical Pharmacist Pager [phone] 01/22/2014 2:40 PM

## 2014-01-22 NOTE — Progress Notes (Signed)
    Subjective:  Denies CP or dyspnea   Objective:  Filed Vitals:   01/21/14 1703 01/21/14 2101 01/22/14 0509 01/22/14 0701  BP: 127/79 115/76 128/77 128/79  Pulse: 81 77 72 75  Temp:  98.7 F (37.1 C) 97.6 F (36.4 C)   TempSrc:  Oral Oral   Resp:  18 18   Height:      Weight:   204 lb 11.2 oz (92.851 kg)   SpO2:  96% 99%     Intake/Output from previous day:  Intake/Output Summary (Last 24 hours) at 01/22/14 0254 Last data filed at 01/21/14 0801  Gross per 24 hour  Intake      0 ml  Output    500 ml  Net   -500 ml    Physical Exam: Physical exam: Well-developed well-nourished in no acute distress.  Skin is warm and dry.  HEENT is normal.  Neck is supple. Chest is clear to auscultation with normal expansion.  Cardiovascular exam is regular rate and rhythm.  Abdominal exam nontender or distended. No masses palpated. Extremities show no edema. neuro grossly intact    Lab Results: Basic Metabolic Panel:  Recent Labs  01/21/14 0521 01/22/14 0042  NA 140 140  K 4.5 4.3  CL 100 97  CO2 23 29  GLUCOSE 200* 116*  BUN 19 19  CREATININE 0.80 1.07  CALCIUM 10.7* 10.0   CBC:  Recent Labs  01/21/14 0521 01/22/14 0042  WBC 6.9 7.3  NEUTROABS 6.2  --   HGB 13.6 13.1  HCT 42.0 40.5  MCV 84.2 86.0  PLT 405* 380   Cardiac Enzymes:  Recent Labs  01/21/14 1255 01/21/14 1814 01/22/14 0042  TROPONINI >20.00* >20.00* 14.37*     Assessment/Plan:  1 non-ST elevation myocardial infarction-patient has ruled in. Continue aspirin, heparin, beta blocker, Plavix and statin. Proceed with cardiac catheterization tomorrow morning. The risks and benefits were discussed and he agrees to proceed. Check echocardiogram for LV function. 2 acute combined systolic/diastolic congestive heart failure-volume status has improved. Continue Lasix (change to 20 mg daily) today but hold tomorrow morning prior to catheterization. 3 hyperglycemia-question diabetes mellitus-check  hemoglobin A1c. 4 hypertension-blood pressure improved. Continue present medications.  Kirk Ruths 01/22/2014, 7:27 AM

## 2014-01-22 NOTE — Progress Notes (Signed)
ANTICOAGULATION CONSULT NOTE - Follow Up Consult  Pharmacy Consult for Heparin Indication: chest pain/ACS  Allergies  Allergen Reactions  . Penicillins Anaphylaxis    Patient Measurements: Height: 5\' 8"  (172.7 cm) Weight: 204 lb 11.2 oz (92.851 kg) IBW/kg (Calculated) : 68.4 Heparin Dosing Weight: 88.4 kg  Vital Signs: Temp: 99 F (37.2 C) (11/22 2111) Temp Source: Oral (11/22 2111) BP: 128/76 mmHg (11/22 2111) Pulse Rate: 81 (11/22 2111)  Labs:  Recent Labs  01/21/14 0521 01/21/14 1255 01/21/14 1814  01/22/14 0042 01/22/14 1240 01/22/14 2127  HGB 13.6  --   --   --  13.1  --   --   HCT 42.0  --   --   --  40.5  --   --   PLT 405*  --   --   --  380  --   --   LABPROT  --  14.4  --   --   --   --   --   INR  --  1.10  --   --   --   --   --   HEPARINUNFRC  --   --   --   < > 0.34 0.24* 0.25*  CREATININE 0.80  --   --   --  1.07  --   --   TROPONINI 0.55* >20.00* >20.00*  --  14.37*  --   --   < > = values in this interval not displayed.  Estimated Creatinine Clearance: 76.1 mL/min (by C-G formula based on Cr of 1.07).   Medications:  Scheduled:  . [START ON 01/23/2014] aspirin  81 mg Oral Pre-Cath  . aspirin EC  81 mg Oral Daily  . atorvastatin  80 mg Oral q1800  . carvedilol  6.25 mg Oral BID WC  . clopidogrel  75 mg Oral Daily  . colchicine  0.6 mg Oral BID  . enalapril  10 mg Oral Daily  . furosemide  20 mg Intravenous Daily  . multivitamin with minerals  1 tablet Oral Daily  . omega-3 acid ethyl esters  1 g Oral BID   Infusions:  . heparin 1,700 Units/hr (01/22/14 1441)    Assessment: 63 yo M presented to ED 11/22 with CP and found to have positive troponin.  Pt was started on heparin with plans for cardiac cath Monday 11/23.  Heparin level is subtherapeutic on 1500 units/hr.  No IV issues noted.  Will adjust rate accordingly.  Goal of Therapy:  Heparin level 0.3-0.7 units/ml Monitor platelets by anticoagulation protocol: Yes   Plan:   Increase heparin infusion to 1700 units/hr. Heparin level in 6 hours. Heparin level and CBC daily while on heparin. Follow-up plans for cardiac cath.  [employer], Pharm.D., BCPS Clinical Pharmacist Pager [phone] 01/22/2014 10:16 PM   Addendum: 01/22/2014 10:16 PM  Heparin level = 0.25 on 1700 units/hr.  No IV issues noted.  Will increase heparin infusion to 1900 units/hr.  Next level with AM labs.  [employer], Pharm.D., BCPS Clinical Pharmacist Pager [phone]

## 2014-01-23 ENCOUNTER — Other Ambulatory Visit: Payer: Self-pay | Admitting: *Deleted

## 2014-01-23 ENCOUNTER — Encounter (HOSPITAL_COMMUNITY)
Admission: EM | Disposition: A | Discharge status: Home / Self Care | Payer: Self-pay | Source: Home / Self Care | Attending: Cardiology

## 2014-01-23 DIAGNOSIS — I10 Essential (primary) hypertension: Secondary | ICD-10-CM | POA: Diagnosis present

## 2014-01-23 DIAGNOSIS — R7303 Prediabetes: Secondary | ICD-10-CM | POA: Diagnosis present

## 2014-01-23 DIAGNOSIS — I2510 Atherosclerotic heart disease of native coronary artery without angina pectoris: Secondary | ICD-10-CM

## 2014-01-23 DIAGNOSIS — E669 Obesity, unspecified: Secondary | ICD-10-CM | POA: Diagnosis present

## 2014-01-23 DIAGNOSIS — R739 Hyperglycemia, unspecified: Secondary | ICD-10-CM

## 2014-01-23 HISTORY — PX: LEFT HEART CATHETERIZATION WITH CORONARY ANGIOGRAM: SHX5451

## 2014-01-23 LAB — BASIC METABOLIC PANEL
Anion gap: 13 (ref 5–15)
BUN: 20 mg/dL (ref 6–23)
CO2: 27 mEq/L (ref 19–32)
Calcium: 9.9 mg/dL (ref 8.4–10.5)
Chloride: 99 mEq/L (ref 96–112)
Creatinine, Ser: 1.13 mg/dL (ref 0.50–1.35)
GFR, EST AFRICAN AMERICAN: 77 mL/min — AB (ref >90)
GFR, EST NON AFRICAN AMERICAN: 66 mL/min — AB (ref >90)
Glucose, Bld: 110 mg/dL — ABNORMAL HIGH (ref 70–99)
POTASSIUM: 4.3 meq/L (ref 3.7–5.3)
SODIUM: 139 meq/L (ref 137–147)

## 2014-01-23 LAB — HEMOGLOBIN A1C
HEMOGLOBIN A1C: 6.4 % — AB (ref <5.7)
MEAN PLASMA GLUCOSE: 137 mg/dL — AB (ref <117)

## 2014-01-23 LAB — CBC
HCT: 44.4 % (ref 39.0–52.0)
Hemoglobin: 14 g/dL (ref 13.0–17.0)
MCH: 26.8 pg (ref 26.0–34.0)
MCHC: 31.5 g/dL (ref 30.0–36.0)
MCV: 84.9 fL (ref 78.0–100.0)
PLATELETS: 400 10*3/uL (ref 150–400)
RBC: 5.23 MIL/uL (ref 4.22–5.81)
RDW: 14.4 % (ref 11.5–15.5)
WBC: 8.4 10*3/uL (ref 4.0–10.5)

## 2014-01-23 LAB — MRSA PCR SCREENING: MRSA BY PCR: NEGATIVE

## 2014-01-23 LAB — HEPARIN LEVEL (UNFRACTIONATED): HEPARIN UNFRACTIONATED: 0.47 [IU]/mL (ref 0.30–0.70)

## 2014-01-23 SURGERY — LEFT HEART CATHETERIZATION WITH CORONARY ANGIOGRAM
Anesthesia: LOCAL

## 2014-01-23 MED ORDER — HEPARIN (PORCINE) IN NACL 2-0.9 UNIT/ML-% IJ SOLN
INTRAMUSCULAR | Status: AC
  Filled 2014-01-23: qty 1500

## 2014-01-23 MED ORDER — SODIUM CHLORIDE 0.9 % IJ SOLN
3.0000 mL | Freq: Two times a day (BID) | INTRAMUSCULAR | Status: DC
  Administered 2014-01-23 – 2014-01-26 (×6): 3 mL via INTRAVENOUS

## 2014-01-23 MED ORDER — SODIUM CHLORIDE 0.9 % IV SOLN
1.0000 mL/kg/h | INTRAVENOUS | Status: AC
  Administered 2014-01-23: 1 mL/kg/h via INTRAVENOUS

## 2014-01-23 MED ORDER — SODIUM CHLORIDE 0.9 % IJ SOLN: 3.0000 mL | INTRAMUSCULAR | Status: DC | PRN

## 2014-01-23 MED ORDER — SODIUM CHLORIDE 0.9 % IV SOLN
250.0000 mL | INTRAVENOUS | Status: DC | PRN
  Administered 2014-01-27: 08:00:00 via INTRAVENOUS

## 2014-01-23 MED ORDER — NITROGLYCERIN 1 MG/10 ML FOR IR/CATH LAB
INTRA_ARTERIAL | Status: AC
  Filled 2014-01-23: qty 10

## 2014-01-23 MED ORDER — LIDOCAINE HCL (PF) 1 % IJ SOLN
INTRAMUSCULAR | Status: AC
  Filled 2014-01-23: qty 30

## 2014-01-23 MED ORDER — MIDAZOLAM HCL 2 MG/2ML IJ SOLN
INTRAMUSCULAR | Status: AC
  Filled 2014-01-23: qty 2

## 2014-01-23 MED ORDER — VERAPAMIL HCL 2.5 MG/ML IV SOLN
INTRAVENOUS | Status: AC
  Filled 2014-01-23: qty 2

## 2014-01-23 MED ORDER — HEPARIN SODIUM (PORCINE) 1000 UNIT/ML IJ SOLN
INTRAMUSCULAR | Status: AC
  Filled 2014-01-23: qty 1

## 2014-01-23 MED ORDER — HEPARIN (PORCINE) IN NACL 100-0.45 UNIT/ML-% IJ SOLN
1950.0000 [IU]/h | INTRAMUSCULAR | Status: DC
  Administered 2014-01-23: 1900 [IU]/h via INTRAVENOUS
  Administered 2014-01-24 (×2): 2000 [IU]/h via INTRAVENOUS
  Administered 2014-01-25 – 2014-01-27 (×4): 1950 [IU]/h via INTRAVENOUS
  Filled 2014-01-23 (×14): qty 250

## 2014-01-23 MED ORDER — FENTANYL CITRATE 0.05 MG/ML IJ SOLN
INTRAMUSCULAR | Status: AC
  Filled 2014-01-23: qty 2

## 2014-01-23 NOTE — Interval H&P Note (Signed)
History and Physical Interval Note:  01/23/2014 2:09 PM  Derek [name]  has presented today for surgery, with the diagnosis of unstable angina  The various methods of treatment have been discussed with the patient and family. After consideration of risks, benefits and other options for treatment, the patient has consented to  Procedure(s): LEFT HEART CATHETERIZATION WITH CORONARY ANGIOGRAM (N/A) as a surgical intervention .  The patient's history has been reviewed, patient examined, no change in status, stable for surgery.  I have reviewed the patient's chart and labs.  Questions were answered to the patient's satisfaction.    Cath Lab Visit (complete for each Cath Lab visit)  Clinical Evaluation Leading to the Procedure:   ACS: Yes.    Non-ACS:    Anginal Classification: CCS IV  Anti-ischemic medical therapy: Minimal Therapy (1 class of medications)  Non-Invasive Test Results: No non-invasive testing performed  Prior CABG: No previous CABG       Sherren Mocha

## 2014-01-23 NOTE — H&P (View-Only) (Signed)
Patient: Derek [name] / Admit Date: 01/21/2014 / Date of Encounter: 01/23/2014, 7:47 AM   Subjective:  63 year old male Street coronary artery disease. He also has a history of hypertension and congestive heart failure.  He was admitted on November 21 with cough thought to be due to  CHF exacerbation and NSTEMI.  Troponin levels are elevated.  Scheduled for cath today.    Feeling well. No CP, SOB, orthopnea, LEE. No h/o bleeding issues and no hx of TIA/CVA.   Objective: Telemetry: NSR rare PVC Physical Exam: Blood pressure 132/92, pulse 77, temperature 98.6 F (37 C), temperature source Oral, resp. rate 18, height 5\' 8"  (1.727 m), weight 201 lb 11.5 oz (91.5 kg), SpO2 100 %. General: Well developed overweight WM in no acute distress. Head: Normocephalic, atraumatic, sclera non-icteric, no xanthomas, nares are without discharge. Neck: Negative for carotid bruits. JVP not elevated. Lungs: Clear bilaterally to auscultation without wheezes, rales, or rhonchi. Breathing is unlabored. Heart: RRR S1 S2 without murmurs, rubs, or gallops.  Abdomen: Soft, non-tender, non-distended with normoactive bowel sounds. No rebound/guarding. Extremities: No clubbing or cyanosis. No edema. Distal pedal pulses are 2+ and equal bilaterally. Neuro: Alert and oriented X 3. Moves all extremities spontaneously. Psych:  Responds to questions appropriately with a normal affect.   Intake/Output Summary (Last 24 hours) at 01/23/14 0747 Last data filed at 01/22/14 1823  Gross per 24 hour  Intake    720 ml  Output      0 ml  Net    720 ml    Inpatient Medications:  . aspirin EC  81 mg Oral Daily  . atorvastatin  80 mg Oral q1800  . carvedilol  6.25 mg Oral BID WC  . clopidogrel  75 mg Oral Daily  . colchicine  0.6 mg Oral BID  . enalapril  10 mg Oral Daily  . furosemide  20 mg Intravenous Daily  . multivitamin with minerals  1 tablet Oral Daily  . omega-3 acid ethyl esters  1 g Oral BID  . sodium chloride   3 mL Intravenous Q12H   Infusions:  . sodium chloride 10 mL (01/23/14 0535)  . heparin 1,900 Units/hr (01/22/14 2242)    Labs:  Recent Labs  01/22/14 0042 01/23/14 0450  NA 140 139  K 4.3 4.3  CL 97 99  CO2 29 27  GLUCOSE 116* 110*  BUN 19 20  CREATININE 1.07 1.13  CALCIUM 10.0 9.9    Recent Labs  01/22/14 0042  AST 76*  ALT 29  ALKPHOS 68  BILITOT 0.6  PROT 7.2  ALBUMIN 3.2*    Recent Labs  01/21/14 0521 01/22/14 0042 01/23/14 0450  WBC 6.9 7.3 8.4  NEUTROABS 6.2  --   --   HGB 13.6 13.1 14.0  HCT 42.0 40.5 44.4  MCV 84.2 86.0 84.9  PLT 405* 380 400    Recent Labs  01/21/14 0521 01/21/14 1255 01/21/14 1814 01/22/14 0042  TROPONINI 0.55* >20.00* >20.00* 14.37*   Invalid input(s): POCBNP No results for input(s): HGBA1C in the last 72 hours.   Radiology/Studies:  Dg Chest 2 View  01/21/2014   CLINICAL DATA:  Chest discomfort and high blood pressure after starting on prednisone yesterday for gout. New onset cough.  EXAM: CHEST  2 VIEW  COMPARISON:  01/09/2014  FINDINGS: Cardiac enlargement with interstitial and perihilar airspace infiltration suggesting edema or pneumonia. Perihilar infiltrates have increased since previous study. No blunting of costophrenic angles. No pneumothorax.  IMPRESSION: Increasing perihilar infiltrates  since previous study suggesting progression of edema or pneumonia.   Electronically Signed   By: Lucienne Capers M.D.   On: 01/21/2014 05:45   Dg Chest 2 View  01/09/2014   CLINICAL DATA:  Cough for 2 months  EXAM: CHEST  2 VIEW  COMPARISON:  None  FINDINGS: Enlargement of cardiac silhouette with pulmonary vascular congestion.  Mediastinal contours normal.  Peribronchial thickening with accentuation of interstitial markings in a perihilar regions with associated Kerley B-lines at the lung bases favor mild pulmonary edema.  No segmental consolidation, pleural effusion or pneumothorax.  Mild scattered endplate spur formation  thoracic spine.  IMPRESSION: Enlargement of cardiac silhouette with pulmonary vascular congestion and probable mild pulmonary edema.   Electronically Signed   By: Lavonia Dana M.D.   On: 01/09/2014 10:53     Assessment and Plan  1. NSTEMI troponin >20 - chest pain free. Continue med rx as ordered for CAD/NSTEMI including heparin, ASA, statin, BB. Plavix 75mg  daily was started on admission without loading that I can see. If significant blockage found, will defer consideration of further loading to invasive physician. Risks/benefits already discussed yesterday - I further explained procedure to patient and family and answered their questions to the best of my ability. Check lipids in AM. 2. Acute combined systolic/diastolic CHF EF 83-66% + WMA by echo 01/22/14 - dry weight at home 200. Appears euvolemic. Wt 209->201. I/O's not charted as ordered, will re-order. Lasix on hold this AM in prep for cath. Would likely resume oral dose in AM if renal function is OK. We discussed daily weights, salt/fluid restriction. He drinks a lot of fluid at home but already salt restricts. 3. Hyperglycemia - A1C pending 4. HTN - follow with titrated regimen. 5. Obesity Body mass index is 30.68 kg/(m^2).  Signed, Melina Copa PA-C   Attending Note:   The patient was seen and examined.  Agree with assessment and plan as noted above.  Changes made to the above note as needed.  Pt is pain free at this time.  No coughing, breathing is ok Discussed risks benefits, options of cardiac cath. He understands and agrees to proceed.   Thayer Headings, Brooke Bonito., MD, [facility] 01/23/2014, 11:27 AM [address],  [place] Pager [phone]

## 2014-01-23 NOTE — CV Procedure (Signed)
    Cardiac Catheterization Procedure Note  Name: Derek [name] MRN: [id number] DOB: 22-Aug-1950  Procedure: Left Heart Cath, Selective Coronary Angiography, LV angiography  Indication: NSTEMI   Procedural Details: The right wrist was prepped, draped, and anesthetized with 1% lidocaine. Using the modified Seldinger technique, a 5/6 French Slender sheath was introduced into the right radial artery. 3 mg of verapamil was administered through the sheath, weight-based unfractionated heparin was administered intravenously. Standard Judkins catheters were used for selective coronary angiography and left ventriculography. Catheter exchanges were performed over an exchange length guidewire. There were no immediate procedural complications. A TR band was used for radial hemostasis at the completion of the procedure.  The patient was transferred to the post catheterization recovery area for further monitoring.  Procedural Findings: Hemodynamics: AO 143/77 LV 139/14  Coronary angiography: Coronary dominance: right  Left mainstem: The left main is patent with mild 20% distal left main stenosis extending into the LAD/left circumflex bifurcation.  Left anterior descending (LAD): The LAD has 50% ostial stenosis. The vessel is diffusely diseased. There is an ulcerated area in the proximal vessel which may represent an occluded diagonal branch. There is diffuse 90% stenosis in the LAD just after the first perforator. The mid LAD has diffuse irregularity without high-grade obstruction. The distal LAD is patent also with diffuse irregularity. The first visualized diagonal has 80-90% stenosis, but it is a tiny vessel (less than 1 mm). The second diagonal has moderate diffuse 70% stenosis. It is also relatively small vessel of approximately 1-1.5 mm.  Left circumflex (LCx): The left circumflex is critically diseased. The vessel has 95% proximal stenosis extending back to the ostium. The mid vessel has 80% stenosis.  There is diffuse calcification present. There are 2 obtuse marginal branches without significant disease.  Right coronary artery (RCA): 100% occlusion of the proximal vessel. There is left to right collateral filling the PDA branch, distal RCA, and mid RCA.  Left ventriculography: There is global and segmental LV systolic dysfunction. There is mild diffuse hypokinesis of the anterolateral and apical segments. The inferior wall is akinetic from the base to the mid ventricle. The estimated LVEF is 35%.   Estimated Blood Loss: Minimal  Final Conclusions:   1. Severe three-vessel coronary artery disease with total occlusion of the right coronary artery and left-to-right collaterals, severe stenosis of the LAD, and severe diffuse stenosis of the left circumflex 2. Moderately severe segmental LV systolic dysfunction  Recommendations: The patient has very severe diffuse three-vessel coronary artery disease. He clearly has surgical coronary anatomy. He will be restarted on IV heparin. His Plavix will be discontinued. Will request cardiac surgical consultation for CABG.  Sherren Mocha MD, [facility] 01/23/2014, 3:35 PM

## 2014-01-23 NOTE — Progress Notes (Signed)
PHARMACY NOTE  Pharmacy Consult :  63 y.o. male s/p Cardiac Cath today.  Patient has been on Heparin until cath.  Plan is to restart Heparin 2 hours after TR band off.  Heparin Dosing Wt :  88.4 kg  Hematology :  Recent Labs  01/21/14 0521 01/21/14 1255 01/21/14 2015 01/22/14 0042 01/22/14 1240 01/22/14 2127 01/23/14 0450  HGB 13.6  --   --  13.1  --   --  14.0  HCT 42.0  --   --  40.5  --   --  44.4  PLT 405*  --   --  380  --   --  400  LABPROT  --  14.4  --   --   --   --   --   INR  --  1.10  --   --   --   --   --   HEPARINUNFRC  --   --  <0.10* 0.34 0.24* 0.25* 0.47  CREATININE 0.80  --   --  1.07  --   --  1.13    Current Medication[s] Include: Scheduled:  Scheduled:  . aspirin EC  81 mg Oral Daily  . atorvastatin  80 mg Oral q1800  . carvedilol  6.25 mg Oral BID WC  . colchicine  0.6 mg Oral BID  . enalapril  10 mg Oral Daily  . multivitamin with minerals  1 tablet Oral Daily  . sodium chloride  3 mL Intravenous Q12H   Infusion[s]: Infusions:  . sodium chloride 1 mL/kg/hr (01/23/14 1753)  . heparin      Assessment :  S/P Cath today.  Patient with severe 3 vessel coronary disease.  Cardiac surgery to be consulted for possible CABG.    Heparin to be restarted post-cath.  Last Heparin level 0.47 units/ml, within therapeutic range.  No evidence of bleeding complications observed.  Goal :  Heparin level 0.3-0.7 units/ml.  Plan : 1. Heparin will be restarted at previous rate, 1900 units/hr.   The next Heparin Level with AM labs. 2. Daily Heparin level, CBC while on Heparin.  Monitor for bleeding complications. Follow Platelet counts.  Daria Mcmeekin, Craig Guess,  Pharm.D  01/23/2014  6:27 PM

## 2014-01-23 NOTE — Progress Notes (Signed)
ANTICOAGULATION CONSULT NOTE - Follow Up Consult  Pharmacy Consult for heparin Indication: NSTEMI  Labs:  Recent Labs  01/21/14 0521 01/21/14 1255 01/21/14 1814  01/22/14 0042 01/22/14 1240 01/22/14 2127 01/23/14 0450  HGB 13.6  --   --   --  13.1  --   --  14.0  HCT 42.0  --   --   --  40.5  --   --  44.4  PLT 405*  --   --   --  380  --   --  400  LABPROT  --  14.4  --   --   --   --   --   --   INR  --  1.10  --   --   --   --   --   --   HEPARINUNFRC  --   --   --   < > 0.34 0.24* 0.25* 0.47  CREATININE 0.80  --   --   --  1.07  --   --  1.13  TROPONINI 0.55* >20.00* >20.00*  --  14.37*  --   --   --   < > = values in this interval not displayed.   Assessment/Plan:  63yo male therapeutic on heparin after rate increases. Will continue gtt at current rate and confirm stable with additional level vs f/u after cath.  Wynona Neat, PharmD, BCPS  01/23/2014,6:26 AM

## 2014-01-23 NOTE — Progress Notes (Addendum)
Patient: Derek [name] / Admit Date: 01/21/2014 / Date of Encounter: 01/23/2014, 7:47 AM   Subjective:  63 year old male with no known hx of  coronary artery disease. He also has a history of hypertension and congestive heart failure.  He was admitted on November 21 with cough thought to be due to  CHF exacerbation and NSTEMI.  Troponin levels are elevated.  Scheduled for cath today.    Feeling well. No CP, SOB, orthopnea, LEE. No h/o bleeding issues and no hx of TIA/CVA.   Objective: Telemetry: NSR rare PVC Physical Exam: Blood pressure 132/92, pulse 77, temperature 98.6 F (37 C), temperature source Oral, resp. rate 18, height 5\' 8"  (1.727 m), weight 201 lb 11.5 oz (91.5 kg), SpO2 100 %. General: Well developed overweight WM in no acute distress. Head: Normocephalic, atraumatic, sclera non-icteric, no xanthomas, nares are without discharge. Neck: Negative for carotid bruits. JVP not elevated. Lungs: Clear bilaterally to auscultation without wheezes, rales, or rhonchi. Breathing is unlabored. Heart: RRR S1 S2 without murmurs, rubs, or gallops.  Abdomen: Soft, non-tender, non-distended with normoactive bowel sounds. No rebound/guarding. Extremities: No clubbing or cyanosis. No edema. Distal pedal pulses are 2+ and equal bilaterally. Neuro: Alert and oriented X 3. Moves all extremities spontaneously. Psych:  Responds to questions appropriately with a normal affect.   Intake/Output Summary (Last 24 hours) at 01/23/14 0747 Last data filed at 01/22/14 1823  Gross per 24 hour  Intake    720 ml  Output      0 ml  Net    720 ml    Inpatient Medications:  . aspirin EC  81 mg Oral Daily  . atorvastatin  80 mg Oral q1800  . carvedilol  6.25 mg Oral BID WC  . clopidogrel  75 mg Oral Daily  . colchicine  0.6 mg Oral BID  . enalapril  10 mg Oral Daily  . furosemide  20 mg Intravenous Daily  . multivitamin with minerals  1 tablet Oral Daily  . omega-3 acid ethyl esters  1 g Oral BID  .  sodium chloride  3 mL Intravenous Q12H   Infusions:  . sodium chloride 10 mL (01/23/14 0535)  . heparin 1,900 Units/hr (01/22/14 2242)    Labs:  Recent Labs  01/22/14 0042 01/23/14 0450  NA 140 139  K 4.3 4.3  CL 97 99  CO2 29 27  GLUCOSE 116* 110*  BUN 19 20  CREATININE 1.07 1.13  CALCIUM 10.0 9.9    Recent Labs  01/22/14 0042  AST 76*  ALT 29  ALKPHOS 68  BILITOT 0.6  PROT 7.2  ALBUMIN 3.2*    Recent Labs  01/21/14 0521 01/22/14 0042 01/23/14 0450  WBC 6.9 7.3 8.4  NEUTROABS 6.2  --   --   HGB 13.6 13.1 14.0  HCT 42.0 40.5 44.4  MCV 84.2 86.0 84.9  PLT 405* 380 400    Recent Labs  01/21/14 0521 01/21/14 1255 01/21/14 1814 01/22/14 0042  TROPONINI 0.55* >20.00* >20.00* 14.37*   Invalid input(s): POCBNP No results for input(s): HGBA1C in the last 72 hours.   Radiology/Studies:  Dg Chest 2 View  01/21/2014   CLINICAL DATA:  Chest discomfort and high blood pressure after starting on prednisone yesterday for gout. New onset cough.  EXAM: CHEST  2 VIEW  COMPARISON:  01/09/2014  FINDINGS: Cardiac enlargement with interstitial and perihilar airspace infiltration suggesting edema or pneumonia. Perihilar infiltrates have increased since previous study. No blunting of costophrenic angles. No pneumothorax.  IMPRESSION: Increasing perihilar infiltrates since previous study suggesting progression of edema or pneumonia.   Electronically Signed   By: Lucienne Capers M.D.   On: 01/21/2014 05:45   Dg Chest 2 View  01/09/2014   CLINICAL DATA:  Cough for 2 months  EXAM: CHEST  2 VIEW  COMPARISON:  None  FINDINGS: Enlargement of cardiac silhouette with pulmonary vascular congestion.  Mediastinal contours normal.  Peribronchial thickening with accentuation of interstitial markings in a perihilar regions with associated Kerley B-lines at the lung bases favor mild pulmonary edema.  No segmental consolidation, pleural effusion or pneumothorax.  Mild scattered endplate spur  formation thoracic spine.  IMPRESSION: Enlargement of cardiac silhouette with pulmonary vascular congestion and probable mild pulmonary edema.   Electronically Signed   By: Lavonia Dana M.D.   On: 01/09/2014 10:53     Assessment and Plan  1. NSTEMI troponin >20 - chest pain free. Continue med rx as ordered for CAD/NSTEMI including heparin, ASA, statin, BB. Plavix 75mg  daily was started on admission without loading that I can see. If significant blockage found, will defer consideration of further loading to invasive physician. Risks/benefits already discussed yesterday - I further explained procedure to patient and family and answered their questions to the best of my ability. Check lipids in AM. 2. 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 Appears euvolemic. Wt 209->201. I/O's not charted as ordered, will re-order. Lasix on hold this AM in prep for cath. Would likely resume oral dose in AM if renal function is OK. We discussed daily weights, salt/fluid restriction. He drinks a lot of fluid at home but already salt restricts. 3. Hyperglycemia - A1C pending 4. HTN - follow with titrated regimen. 5. Obesity Body mass index is 30.68 kg/(m^2).  Signed, Melina Copa PA-C   Attending Note:   The patient was seen and examined.  Agree with assessment and plan as noted above.  Changes made to the above note as needed.  Pt is pain free at this time.  No coughing, breathing is ok Discussed risks benefits, options of cardiac cath. He understands and agrees to proceed.   Thayer Headings, Brooke Bonito., MD, [facility] 01/23/2014, 11:27 AM [address]  [place] Pager [phone]

## 2014-01-24 ENCOUNTER — Encounter (HOSPITAL_COMMUNITY): Payer: Medicare Other

## 2014-01-24 DIAGNOSIS — Z01810 Encounter for preprocedural cardiovascular examination: Secondary | ICD-10-CM

## 2014-01-24 DIAGNOSIS — I2510 Atherosclerotic heart disease of native coronary artery without angina pectoris: Secondary | ICD-10-CM

## 2014-01-24 DIAGNOSIS — I5041 Acute combined systolic (congestive) and diastolic (congestive) heart failure: Secondary | ICD-10-CM

## 2014-01-24 LAB — HEPATIC FUNCTION PANEL
ALT: 17 U/L (ref 0–53)
AST: 19 U/L (ref 0–37)
Albumin: 3.1 g/dL — ABNORMAL LOW (ref 3.5–5.2)
Alkaline Phosphatase: 70 U/L (ref 39–117)
Bilirubin, Direct: <0.2 mg/dL (ref 0.0–0.3)
TOTAL PROTEIN: 7 g/dL (ref 6.0–8.3)
Total Bilirubin: 0.4 mg/dL (ref 0.3–1.2)

## 2014-01-24 LAB — CBC
HCT: 41.3 % (ref 39.0–52.0)
Hemoglobin: 13.2 g/dL (ref 13.0–17.0)
MCH: 27.7 pg (ref 26.0–34.0)
MCHC: 32 g/dL (ref 30.0–36.0)
MCV: 86.6 fL (ref 78.0–100.0)
PLATELETS: 368 10*3/uL (ref 150–400)
RBC: 4.77 MIL/uL (ref 4.22–5.81)
RDW: 14.4 % (ref 11.5–15.5)
WBC: 6.9 10*3/uL (ref 4.0–10.5)

## 2014-01-24 LAB — HEPARIN LEVEL (UNFRACTIONATED)
HEPARIN UNFRACTIONATED: 0.23 [IU]/mL — AB (ref 0.30–0.70)
Heparin Unfractionated: 0.35 IU/mL (ref 0.30–0.70)
Heparin Unfractionated: 0.44 IU/mL (ref 0.30–0.70)

## 2014-01-24 LAB — LIPID PANEL
CHOLESTEROL: 212 mg/dL — AB (ref 0–200)
HDL: 23 mg/dL — ABNORMAL LOW (ref >39)
LDL Cholesterol: 139 mg/dL — ABNORMAL HIGH (ref 0–99)
Total CHOL/HDL Ratio: 9.2 RATIO
Triglycerides: 252 mg/dL — ABNORMAL HIGH (ref <150)
VLDL: 50 mg/dL — ABNORMAL HIGH (ref 0–40)

## 2014-01-24 NOTE — Progress Notes (Addendum)
VASCULAR LAB PRELIMINARY  PRELIMINARY  PRELIMINARY  PRELIMINARY  Pre-op Cardiac Surgery  Carotid Findings:  Bilateral:  1-39% ICA stenosis.  Vertebral artery flow is antegrade.      Upper Extremity Right Left  Brachial Pressures 159 Triphasic 149 Triphasic  Radial Waveforms Triphasic Triphasic  Ulnar Waveforms Triphasic Triphasic  Palmar Arch (Allen's Test) Normal Abnormal   Findings:  Right Doppler waveforms remained normal with both radial and ulnar compressions. Left - Doppler waveforms remained normal with radial compression and diminished greater than 50% with ulnar compression.    Lower  Extremity Right Left  Dorsalis Pedis    Anterior Tibial    Posterior Tibial    Ankle/Brachial Indices      Findings:  Palpable pedal pulses bilaterally at rest.   BIGGS, SANDRA, RVT 01/24/2014, 2:03 PM   Derek [name], [place] , [place] 01/26/2014 6:15 PM

## 2014-01-24 NOTE — Progress Notes (Signed)
ANTICOAGULATION CONSULT NOTE - Follow Up Consult  Pharmacy Consult for Heparin Indication: chest pain/ACS  Allergies  Allergen Reactions  . Penicillins Anaphylaxis    Patient Measurements: Height: 5\' 8"  (172.7 cm) Weight: 204 lb 2.3 oz (92.6 kg) IBW/kg (Calculated) : 68.4 Heparin Dosing Weight: 87.6kg  Vital Signs: Temp: 99.1 F (37.3 C) (11/24 1618) Temp Source: Oral (11/24 1618) BP: 166/93 mmHg (11/24 1632) Pulse Rate: 80 (11/24 1632)  Labs:  Recent Labs  01/21/14 1814  01/22/14 0042  01/23/14 0450 01/24/14 0256 01/24/14 1100 01/24/14 1630  HGB  --   < > 13.1  --  14.0 13.2  --   --   HCT  --   --  40.5  --  44.4 41.3  --   --   PLT  --   --  380  --  400 368  --   --   HEPARINUNFRC  --   < > 0.34  < > 0.47 0.23* 0.35 0.44  CREATININE  --   --  1.07  --  1.13  --   --   --   TROPONINI >20.00*  --  14.37*  --   --   --   --   --   < > = values in this interval not displayed.  Estimated Creatinine Clearance: 72 mL/min (by C-G formula based on Cr of 1.13).   Medications:  Infusions:  . heparin 2,000 Units/hr (01/24/14 0920)   Assessment: 63 yo M admitted on 11/21 with CP s/p cath showing severe 3 vessel disease now awaiting CABG. HL remains therapeutic at 0.44 on heparin 2000 units/hr. CBC remains wnl and stable with no reported s/s bleeding.  Goal of Therapy:  Heparin level 0.3-0.7 units/ml Monitor platelets by anticoagulation protocol: Yes   Plan:  - Continue hep gtt 2000 u/hr - Daily HL/CBC - Monitor s/s bleeding  Andrey Cota. Diona Foley, PharmD Clinical Pharmacist Pager [phone]  01/24/2014 5:41 PM

## 2014-01-24 NOTE — Progress Notes (Signed)
ANTICOAGULATION CONSULT NOTE - Follow Up Consult  Pharmacy Consult for heparin Indication: CAD awaiting possible CABG  Labs:  Recent Labs  01/21/14 0521 01/21/14 1255 01/21/14 1814  01/22/14 0042  01/22/14 2127 01/23/14 0450 01/24/14 0256  HGB 13.6  --   --   --  13.1  --   --  14.0 13.2  HCT 42.0  --   --   --  40.5  --   --  44.4 41.3  PLT 405*  --   --   --  380  --   --  400 368  LABPROT  --  14.4  --   --   --   --   --   --   --   INR  --  1.10  --   --   --   --   --   --   --   HEPARINUNFRC  --   --   --   < > 0.34  < > 0.25* 0.47 0.23*  CREATININE 0.80  --   --   --  1.07  --   --  1.13  --   TROPONINI 0.55* >20.00* >20.00*  --  14.37*  --   --   --   --   < > = values in this interval not displayed.   Assessment: 63yo male subtherapeutic on heparin after resumed post-cath.  Goal of Therapy:  Heparin level 0.3-0.7 units/ml   Plan:  May need some more accumulation but will increase heparin gtt slightly to 2000 units/hr and check level in [place], PharmD, BCPS  01/24/2014,4:22 AM

## 2014-01-24 NOTE — Care Management Note (Signed)
    Page 1 of 1   01/24/2014     9:37:58 AM CARE MANAGEMENT NOTE 01/24/2014  Patient:  Derek [name], Derek [name]   Account Number:  [id number]  Date Initiated:  01/24/2014  Documentation initiated by:  Elissa Hefty  Subjective/Objective Assessment:   adm w heart failure     Action/Plan:   lives w fam   Anticipated DC Date:  01/28/2014   Anticipated DC Plan:  HOME/SELF CARE         Choice offered to / List presented to:             Status of service:   Medicare Important Message given?  YES (If response is "NO", the following Medicare IM given date fields will be blank) Date Medicare IM given:  01/24/2014 Medicare IM given by:  Elissa Hefty Date Additional Medicare IM given:   Additional Medicare IM given by:    Discharge Disposition:    Per UR Regulation:    If discussed at Long Length of Stay Meetings, dates discussed:    Comments:  11/24 0937 debbie Derrek Puff rn,bsn pt for cvts eval for poss cabg.

## 2014-01-24 NOTE — Progress Notes (Signed)
ANTICOAGULATION CONSULT NOTE - Follow Up Consult  Pharmacy Consult for Heparin Indication: chest pain/ACS  Allergies  Allergen Reactions  . Penicillins Anaphylaxis    Patient Measurements: Height: 5\' 8"  (172.7 cm) Weight: 204 lb 2.3 oz (92.6 kg) IBW/kg (Calculated) : 68.4 Heparin Dosing Weight: 87.6kg  Vital Signs: Temp: 98.8 F (37.1 C) (11/24 1232) Temp Source: Oral (11/24 1232) BP: 143/83 mmHg (11/24 1232) Pulse Rate: 74 (11/24 1232)  Labs:  Recent Labs  01/21/14 1814  01/22/14 0042  01/23/14 0450 01/24/14 0256 01/24/14 1100  HGB  --   < > 13.1  --  14.0 13.2  --   HCT  --   --  40.5  --  44.4 41.3  --   PLT  --   --  380  --  400 368  --   HEPARINUNFRC  --   < > 0.34  < > 0.47 0.23* 0.35  CREATININE  --   --  1.07  --  1.13  --   --   TROPONINI >20.00*  --  14.37*  --   --   --   --   < > = values in this interval not displayed.  Estimated Creatinine Clearance: 72 mL/min (by C-G formula based on Cr of 1.13).   Medications:  Infusions:  . heparin 2,000 Units/hr (01/24/14 0920)   PRN: sodium chloride, acetaminophen, ALPRAZolam, metoprolol, nitroGLYCERIN, ondansetron (ZOFRAN) IV, sodium chloride, zolpidem  Assessment: 63 yo M admitted on 11/21 with CP s/p cath showing severe 3 vessel disease now awaiting CABG. HL is now therapeutic at 0.35. CBC remains wnl and stable with no reported s/s bleeding.  Goal of Therapy:  Heparin level 0.3-0.7 units/ml Monitor platelets by anticoagulation protocol: Yes   Plan:  - Continue hep gtt 2000 u/hr - Confirmatory HL in 6 hours - Daily HL/CBC - Monitor s/s bleeding  Harolyn Rutherford, PharmD Clinical Pharmacist - Resident Pager: [phone] Pharmacy: [phone] 01/24/2014 1:56 PM

## 2014-01-24 NOTE — Progress Notes (Signed)
SUBJECTIVE:  No chest pain, no SOB, overall feels better.  OBJECTIVE:   Vitals:   Filed Vitals:   01/24/14 0803 01/24/14 1045 01/24/14 1230 01/24/14 1232  BP:  121/61 143/83 143/83  Pulse:  75 75 74  Temp: 98 F (36.7 C)   98.8 F (37.1 C)  TempSrc: Oral   Oral  Resp:  15 21 15   Height:      Weight:      SpO2:  98% 100% 100%   I&O's:   Intake/Output Summary (Last 24 hours) at 01/24/14 1311 Last data filed at 01/24/14 1200  Gross per 24 hour  Intake 1482.68 ml  Output   1150 ml  Net 332.68 ml   TELEMETRY: Reviewed telemetry pt in NSR:     PHYSICAL EXAM General: Well developed, well nourished, in no acute distress Head:   Normal cephalic and atramatic  Lungs:  Clear bilaterally to auscultation. Heart:  HRRR S1 S2  No JVD.   Abdomen: abdomen soft and non-tender Msk:  Back normal,  Normal strength and tone for age. Extremities:  No edema.   Neuro: Alert and oriented. Psych:  Normal affect, responds appropriately Skin: No rash   LABS: Basic Metabolic Panel:  Recent Labs  01/22/14 0042 01/23/14 0450  NA 140 139  K 4.3 4.3  CL 97 99  CO2 29 27  GLUCOSE 116* 110*  BUN 19 20  CREATININE 1.07 1.13  CALCIUM 10.0 9.9   Liver Function Tests:  Recent Labs  01/22/14 0042 01/24/14 0256  AST 76* 19  ALT 29 17  ALKPHOS 68 70  BILITOT 0.6 0.4  PROT 7.2 7.0  ALBUMIN 3.2* 3.1*   No results for input(s): LIPASE, AMYLASE in the last 72 hours. CBC:  Recent Labs  01/23/14 0450 01/24/14 0256  WBC 8.4 6.9  HGB 14.0 13.2  HCT 44.4 41.3  MCV 84.9 86.6  PLT 400 368   Cardiac Enzymes:  Recent Labs  01/21/14 1814 01/22/14 0042  TROPONINI >20.00* 14.37*   BNP: Invalid input(s): POCBNP D-Dimer: No results for input(s): DDIMER in the last 72 hours. Hemoglobin A1C:  Recent Labs  01/23/14 0450  HGBA1C 6.4*   Fasting Lipid Panel:  Recent Labs  01/24/14 0256  CHOL 212*  HDL 23*  LDLCALC 139*  TRIG 252*  CHOLHDL 9.2   Thyroid Function  Tests: No results for input(s): TSH, T4TOTAL, T3FREE, THYROIDAB in the last 72 hours.  Invalid input(s): FREET3 Anemia Panel: No results for input(s): VITAMINB12, FOLATE, FERRITIN, TIBC, IRON, RETICCTPCT in the last 72 hours. Coag Panel:   Lab Results  Component Value Date   INR 1.10 01/21/2014    RADIOLOGY: Dg Chest 2 View  01/21/2014   CLINICAL DATA:  Chest discomfort and high blood pressure after starting on prednisone yesterday for gout. New onset cough.  EXAM: CHEST  2 VIEW  COMPARISON:  01/09/2014  FINDINGS: Cardiac enlargement with interstitial and perihilar airspace infiltration suggesting edema or pneumonia. Perihilar infiltrates have increased since previous study. No blunting of costophrenic angles. No pneumothorax.  IMPRESSION: Increasing perihilar infiltrates since previous study suggesting progression of edema or pneumonia.   Electronically Signed   By: Lucienne Capers M.D.   On: 01/21/2014 05:45   Dg Chest 2 View  01/09/2014   CLINICAL DATA:  Cough for 2 months  EXAM: CHEST  2 VIEW  COMPARISON:  None  FINDINGS: Enlargement of cardiac silhouette with pulmonary vascular congestion.  Mediastinal contours normal.  Peribronchial thickening with accentuation of  interstitial markings in a perihilar regions with associated Kerley B-lines at the lung bases favor mild pulmonary edema.  No segmental consolidation, pleural effusion or pneumothorax.  Mild scattered endplate spur formation thoracic spine.  IMPRESSION: Enlargement of cardiac silhouette with pulmonary vascular congestion and probable mild pulmonary edema.   Electronically Signed   By: Lavonia Dana M.D.   On: 01/09/2014 10:53      ASSESSMENT/PLAN:     NSTEMI (non-ST elevated myocardial infarction) - Cardiac cath on 11/23 by Dr. Burt Knack revealed severe 3 vessel disease.  Patient to be evaluated today for CABG by CVTS. - Continue Heparin - Plavix held for potential CABG - ASA, Atorvastatin 80mg , Coreg 6.25 BID, Enalapril     Systolic and diastolic CHF, acute - EF 49-82% + WMA by echo 01/22/14 -Weight 204 lbs, I&O poorly documented. - Daily weights, strict I&O    HTN (hypertension) - Continue home meds    Prediabetes - A1c 6.4, discussed importance of weight loss, diet and exercise in the future to prevent the development of diabetes.   Lucious Groves, DO  01/24/2014  1:11 PM   History and all data above reviewed.  Patient examined.  I agree with the findings as above. He denies any chest pain.  No acute SOB.   The patient exam reveals COR:RRR  ,  Lungs: Clear  ,  Abd: Positive bowel sounds, no rebound no guarding, Ext No edema  .  All available labs, radiology testing, previous records reviewed. Agree with documented assessment and plan. CAD:  [facility] consult pending.  Ischemic cardiomyopathy:  Appears to be euvolemic.  Continue current meds.    Jeneen Rinks Natalyah Cummiskey  1:37 PM  01/24/2014

## 2014-01-24 NOTE — Progress Notes (Signed)
CARDIAC REHAB PHASE I   PRE:  Rate/Rhythm: 87 SR  BP:  Supine: 141/79  Sitting:   Standing:    SaO2: 97%RA  MODE:  Ambulation: 350 ft   POST:  Rate/Rhythm: 107 ST  BP:  Supine:   Sitting: 176/95  Standing:    SaO2: 100%RA 1345-1430 Pt walked 350 ft on RA with steady gait. Tolerated well. No CP. Discussed sternal precautions, importance of mobility and IS after surgery, and CRP 2 for after discharge. Gave [facility] booklet and care guide. Wrote down how to view preop video. Answered questions re activity after surgery.   Graylon Good, RN BSN  01/24/2014 2:27 PM

## 2014-01-24 NOTE — Consult Note (Signed)
[address]       [place] [address]             [phone]      Cardiothoracic Surgery Consultation   Reason for Consult: Severe multivessel coronary disease Referring Physician: Dr. Sherren Mocha  Derek [name] is an 63 y.o. male.  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 BNP was elevated at 1971. He was started on Coreg, Enalapril, and lasix and he noted marked improvement in his cough. He was referred to Dr. [name]. He had LVH on an ECG and an echo was done that showed an EF of 40-45% with severe hypkinesis of the apical anteroseptal, inferior and inferoseptal myocardium, grade 2 diastolic dysfunction. He then had an episode of gout in his foot and was seen again at [facility] and was given prednisone. After the first dose he was restless with cough and felt bad so he went back to [facility] where he was noted to be markedly hypertensive with mildly elevated cardiac enzymes. He was transferred to [facility] and cath yesterday showed severe 3 vessel CAD with a diffusely diseased LAD with 90% proximal stenosis. The LCX has 95% proximal stenosis extending back to the ostium. The RCA is occluded with left to right collaterals. The EF is 35%.   Past Medical History  Diagnosis Date  . Hypertension   . Gout   . NSTEMI (non-ST elevated myocardial infarction)     Past Surgical History  Procedure Laterality Date  . Knee surgery      Family History  Problem Relation Age of Onset  . Cancer Mother   . Cancer Father     Social History:  reports that he has never smoked. He has never used smokeless tobacco. He reports that he drinks alcohol. He reports that he does not use illicit drugs.  Allergies:  Allergies  Allergen Reactions  . Penicillins Anaphylaxis    Medications:  I have reviewed the patient's current  medications. Prior to Admission:  Prescriptions prior to admission  Medication Sig Dispense Refill Last Dose  . aspirin 81 MG tablet Take 81 mg by mouth daily.   01/20/2014 at Unknown time  . carvedilol (COREG) 3.125 MG tablet Take 1 tablet (3.125 mg total) by mouth 2 (two) times daily with a meal. 180 tablet 1 01/21/2014 at 300  . Cholecalciferol (VITAMIN D-3) 5000 UNITS TABS Take 1 tablet by mouth daily.    01/20/2014 at Unknown time  . enalapril (VASOTEC) 10 MG tablet Take 0.5 tablets (5 mg total) by mouth daily. 90 tablet 1 01/21/2014 at Unknown time  . furosemide (LASIX) 20 MG tablet Take 1 tablet (20 mg total) by mouth daily. 90 tablet 1 01/21/2014 at Unknown time  . L-Tryptophan 500 MG CAPS Take 1,000 mg by mouth daily.   01/21/2014 at Unknown time  . Melatonin 5 MG TABS Take by mouth at bedtime.   01/19/2014 at Unknown time  . Multiple Vitamin (MULTIVITAMIN) capsule Take 1 capsule by mouth daily.   01/21/2014 at Unknown time  . Nutritional Supplements (GRAPESEED EXTRACT PO) Take by mouth daily. Resveratrol   01/21/2014 at Unknown time  . Omega-3 Fatty Acids (OMEGA 3 PO) Take 1,280 mg by mouth daily.   01/21/2014 at Unknown time  . Valerian 500  MG CAPS Take 2 capsules by mouth daily.   01/21/2014 at Unknown time  . [DISCONTINUED] predniSONE (DELTASONE) 20 MG tablet 3 tabs po day one, then 2 tabs daily x 4 days 11 tablet 0    Scheduled: . aspirin EC  81 mg Oral Daily  . atorvastatin  80 mg Oral q1800  . carvedilol  6.25 mg Oral BID WC  . enalapril  10 mg Oral Daily  . multivitamin with minerals  1 tablet Oral Daily  . sodium chloride  3 mL Intravenous Q12H   Continuous: . heparin 2,000 Units/hr (01/24/14 2000)   QRF:XJOITG chloride, acetaminophen, ALPRAZolam, metoprolol, nitroGLYCERIN, ondansetron (ZOFRAN) IV, sodium chloride, zolpidem  Results for orders placed or performed during the hospital encounter of 01/21/14 (from the past 48 hour(s))  Heparin level (unfractionated)      Status: Abnormal   Collection Time: 01/22/14  9:27 PM  Result Value Ref Range   Heparin Unfractionated 0.25 (L) 0.30 - 0.70 IU/mL    Comment:        IF HEPARIN RESULTS ARE BELOW EXPECTED VALUES, AND PATIENT DOSAGE HAS BEEN CONFIRMED, SUGGEST FOLLOW UP TESTING OF ANTITHROMBIN III LEVELS.   Heparin level (unfractionated)     Status: None   Collection Time: 01/23/14  4:50 AM  Result Value Ref Range   Heparin Unfractionated 0.47 0.30 - 0.70 IU/mL    Comment:        IF HEPARIN RESULTS ARE BELOW EXPECTED VALUES, AND PATIENT DOSAGE HAS BEEN CONFIRMED, SUGGEST FOLLOW UP TESTING OF ANTITHROMBIN III LEVELS.   CBC     Status: None   Collection Time: 01/23/14  4:50 AM  Result Value Ref Range   WBC 8.4 4.0 - 10.5 K/uL   RBC 5.23 4.22 - 5.81 MIL/uL   Hemoglobin 14.0 13.0 - 17.0 g/dL   HCT 44.4 39.0 - 52.0 %   MCV 84.9 78.0 - 100.0 fL   MCH 26.8 26.0 - 34.0 pg   MCHC 31.5 30.0 - 36.0 g/dL   RDW 14.4 11.5 - 15.5 %   Platelets 400 150 - 400 K/uL  Basic metabolic panel     Status: Abnormal   Collection Time: 01/23/14  4:50 AM  Result Value Ref Range   Sodium 139 137 - 147 mEq/L   Potassium 4.3 3.7 - 5.3 mEq/L   Chloride 99 96 - 112 mEq/L   CO2 27 19 - 32 mEq/L   Glucose, Bld 110 (H) 70 - 99 mg/dL   BUN 20 6 - 23 mg/dL   Creatinine, Ser 1.13 0.50 - 1.35 mg/dL   Calcium 9.9 8.4 - 10.5 mg/dL   GFR calc non Af Amer 66 (L) >90 mL/min   GFR calc Af Amer 77 (L) >90 mL/min    Comment: (NOTE) The eGFR has been calculated using the CKD EPI equation. This calculation has not been validated in all clinical situations. eGFR's persistently <90 mL/min signify possible Chronic Kidney Disease.    Anion gap 13 5 - 15  Hemoglobin A1c     Status: Abnormal   Collection Time: 01/23/14  4:50 AM  Result Value Ref Range   Hgb A1c MFr Bld 6.4 (H) <5.7 %    Comment: (NOTE)  According to the ADA Clinical Practice Recommendations for  2011, when HbA1c is used as a screening test:  >=6.5%   Diagnostic of Diabetes Mellitus           (if abnormal result is confirmed) 5.7-6.4%   Increased risk of developing Diabetes Mellitus References:Diagnosis and Classification of Diabetes Mellitus,Diabetes UKGU,5427,06(CBJSE 1):S62-S69 and Standards of Medical Care in         Diabetes - 2011,Diabetes GBTD,1761,60 (Suppl 1):S11-S61.    Mean Plasma Glucose 137 (H) <117 mg/dL    Comment: Performed at [facility] PCR Screening     Status: None   Collection Time: 01/23/14  5:55 PM  Result Value Ref Range   MRSA by PCR NEGATIVE NEGATIVE    Comment:        The GeneXpert MRSA Assay (FDA approved for NASAL specimens only), is one component of a comprehensive MRSA colonization surveillance program. It is not intended to diagnose MRSA infection nor to guide or monitor treatment for MRSA infections.   Heparin level (unfractionated)     Status: Abnormal   Collection Time: 01/24/14  2:56 AM  Result Value Ref Range   Heparin Unfractionated 0.23 (L) 0.30 - 0.70 IU/mL    Comment:        IF HEPARIN RESULTS ARE BELOW EXPECTED VALUES, AND PATIENT DOSAGE HAS BEEN CONFIRMED, SUGGEST FOLLOW UP TESTING OF ANTITHROMBIN III LEVELS.   CBC     Status: None   Collection Time: 01/24/14  2:56 AM  Result Value Ref Range   WBC 6.9 4.0 - 10.5 K/uL   RBC 4.77 4.22 - 5.81 MIL/uL   Hemoglobin 13.2 13.0 - 17.0 g/dL   HCT 41.3 39.0 - 52.0 %   MCV 86.6 78.0 - 100.0 fL   MCH 27.7 26.0 - 34.0 pg   MCHC 32.0 30.0 - 36.0 g/dL   RDW 14.4 11.5 - 15.5 %   Platelets 368 150 - 400 K/uL  Hepatic function panel     Status: Abnormal   Collection Time: 01/24/14  2:56 AM  Result Value Ref Range   Total Protein 7.0 6.0 - 8.3 g/dL   Albumin 3.1 (L) 3.5 - 5.2 g/dL   AST 19 0 - 37 U/L   ALT 17 0 - 53 U/L   Alkaline Phosphatase 70 39 - 117 U/L   Total Bilirubin 0.4 0.3 - 1.2 mg/dL   Bilirubin, Direct <0.2 0.0 - 0.3 mg/dL   Indirect Bilirubin NOT  CALCULATED 0.3 - 0.9 mg/dL  Lipid panel     Status: Abnormal   Collection Time: 01/24/14  2:56 AM  Result Value Ref Range   Cholesterol 212 (H) 0 - 200 mg/dL   Triglycerides 252 (H) <150 mg/dL   HDL 23 (L) >39 mg/dL   Total CHOL/HDL Ratio 9.2 RATIO   VLDL 50 (H) 0 - 40 mg/dL   LDL Cholesterol 139 (H) 0 - 99 mg/dL    Comment:        Total Cholesterol/HDL:CHD Risk Coronary Heart Disease Risk Table                     Men   Women  1/2 Average Risk   3.4   3.3  Average Risk       5.0   4.4  2 X Average Risk   9.6   7.1  3 X Average Risk  23.4   11.0        Use the calculated Patient Ratio  above and the CHD Risk Table to determine the patient's CHD Risk.        ATP III CLASSIFICATION (LDL):  <100     mg/dL   Optimal  100-129  mg/dL   Near or Above                    Optimal  130-159  mg/dL   Borderline  160-189  mg/dL   High  >190     mg/dL   Very High   Heparin level (unfractionated)     Status: None   Collection Time: 01/24/14 11:00 AM  Result Value Ref Range   Heparin Unfractionated 0.35 0.30 - 0.70 IU/mL    Comment:        IF HEPARIN RESULTS ARE BELOW EXPECTED VALUES, AND PATIENT DOSAGE HAS BEEN CONFIRMED, SUGGEST FOLLOW UP TESTING OF ANTITHROMBIN III LEVELS.   Heparin level (unfractionated)     Status: None   Collection Time: 01/24/14  4:30 PM  Result Value Ref Range   Heparin Unfractionated 0.44 0.30 - 0.70 IU/mL    Comment:        IF HEPARIN RESULTS ARE BELOW EXPECTED VALUES, AND PATIENT DOSAGE HAS BEEN CONFIRMED, SUGGEST FOLLOW UP TESTING OF ANTITHROMBIN III LEVELS.     No results found.  Review of Systems  Constitutional: Positive for malaise/fatigue. Negative for fever, chills, weight loss and diaphoresis.  Eyes: Negative.   Respiratory: Positive for cough, sputum production and shortness of breath.   Cardiovascular: Negative for chest pain, palpitations, orthopnea, leg swelling and PND.  Gastrointestinal: Negative.   Genitourinary: Negative.     Musculoskeletal: Positive for joint pain.  Skin: Negative.   Neurological: Negative.   Endo/Heme/Allergies: Negative.   Psychiatric/Behavioral: Negative.    Blood pressure 130/66, pulse 73, temperature 98.9 F (37.2 C), temperature source Axillary, resp. rate 23, height 5' 8"  (1.727 m), weight 92.6 kg (204 lb 2.3 oz), SpO2 99 %. Physical Exam  Constitutional: He is oriented to person, place, and time. He appears well-developed and well-nourished. No distress.  HENT:  Head: Normocephalic and atraumatic.  Mouth/Throat: Oropharynx is clear and moist.  Eyes: EOM are normal. Pupils are equal, round, and reactive to light.  Neck: Normal range of motion. Neck supple. No JVD present. No thyromegaly present.  Cardiovascular: Normal rate, regular rhythm, normal heart sounds and intact distal pulses.   No murmur heard. Respiratory: Effort normal and breath sounds normal. No respiratory distress. He has no rales.  GI: Soft. Bowel sounds are normal. He exhibits no distension. There is no tenderness.  Musculoskeletal: He exhibits no edema.  Lymphadenopathy:    He has no cervical adenopathy.  Neurological: He is alert and oriented to person, place, and time. He has normal strength. No cranial nerve deficit or sensory deficit.  Skin: Skin is warm and dry.  Psychiatric: He has a normal mood and affect.    Cardiac Catheterization Procedure Note  Name: Derek [name] MRN: [id number] DOB: October 17, 1950  Procedure: Left Heart Cath, Selective Coronary Angiography, LV angiography  Indication: NSTEMI  Procedural Details: The right wrist was prepped, draped, and anesthetized with 1% lidocaine. Using the modified Seldinger technique, a 5/6 French Slender sheath was introduced into the right radial artery. 3 mg of verapamil was administered through the sheath, weight-based unfractionated heparin was administered intravenously. Standard Judkins catheters were used for selective  coronary angiography and left ventriculography. Catheter exchanges were performed over an exchange length guidewire. There were no immediate procedural complications. A TR band  was used for radial hemostasis at the completion of the procedure. The patient was transferred to the post catheterization recovery area for further monitoring.  Procedural Findings: Hemodynamics: AO 143/77 LV 139/14  Coronary angiography: Coronary dominance: right  Left mainstem: The left main is patent with mild 20% distal left main stenosis extending into the LAD/left circumflex bifurcation.  Left anterior descending (LAD): The LAD has 50% ostial stenosis. The vessel is diffusely diseased. There is an ulcerated area in the proximal vessel which may represent an occluded diagonal branch. There is diffuse 90% stenosis in the LAD just after the first perforator. The mid LAD has diffuse irregularity without high-grade obstruction. The distal LAD is patent also with diffuse irregularity. The first visualized diagonal has 80-90% stenosis, but it is a tiny vessel (less than 1 mm). The second diagonal has moderate diffuse 70% stenosis. It is also relatively small vessel of approximately 1-1.5 mm.  Left circumflex (LCx): The left circumflex is critically diseased. The vessel has 95% proximal stenosis extending back to the ostium. The mid vessel has 80% stenosis. There is diffuse calcification present. There are 2 obtuse marginal branches without significant disease.  Right coronary artery (RCA): 100% occlusion of the proximal vessel. There is left to right collateral filling the PDA branch, distal RCA, and mid RCA.  Left ventriculography: There is global and segmental LV systolic dysfunction. There is mild diffuse hypokinesis of the anterolateral and apical segments. The inferior wall is akinetic from the base to the mid ventricle. The estimated LVEF is 35%.   Estimated Blood Loss: Minimal  Final Conclusions:  1. Severe  three-vessel coronary artery disease with total occlusion of the right coronary artery and left-to-right collaterals, severe stenosis of the LAD, and severe diffuse stenosis of the left circumflex 2. Moderately severe segmental LV systolic dysfunction  Recommendations: The patient has very severe diffuse three-vessel coronary artery disease. He clearly has surgical coronary anatomy. He will be restarted on IV heparin. His Plavix will be discontinued. Will request cardiac surgical consultation for CABG.  Sherren Mocha MD, [facility] 01/23/2014, 3:35 PM                *[address] [place], [place] [address]              [phone]  ------------------------------------------------------------------- Transthoracic Echocardiography  Patient:  Derek [name], Derek [name] MR #:    [id number] Study Date: 01/22/2014 Gender:   M Age:    40 Height:   172.7 cm Weight:   92.5 kg BSA:    2.13 m^2 Pt. Status: Room:    [facility] ADMITTING  Sanda Klein, MD PERFORMING  [facility], Inpatient SONOGRAPHER Roseanna Rainbow  cc:  ------------------------------------------------------------------- LV EF: 40% -  45%  ------------------------------------------------------------------- History:  PMH: LVH. NSTEMI. Congestive heart failure. Risk factors: Hypertension.  ------------------------------------------------------------------- Study Conclusions  - Left ventricle: The cavity size was normal. There was moderate concentric hypertrophy. Systolic function was mildly to moderately reduced. The estimated ejection fraction was in the range of 40% to 45%. Severe hypokinesis of the apicalanteroseptal, inferior, and inferoseptal myocardium. Features are  consistent with a pseudonormal left ventricular filling pattern, with concomitant abnormal relaxation and increased filling pressure (grade 2 diastolic dysfunction). - Mitral valve: Calcified  annulus. There was mild regurgitation. - Left atrium: The atrium was moderately dilated.  Transthoracic echocardiography. M-mode, complete 2D, spectral Doppler, and color Doppler. Birthdate: Patient birthdate: 07-27-1950. Age: Patient is 63 yr old. Sex: Gender: male. BMI: 31 kg/m^2. Blood pressure:   128/79 Patient status: Inpatient. Study date: Study date: 01/22/2014. Study time: 12:51 PM. Location: Bedside.  -------------------------------------------------------------------  ------------------------------------------------------------------- Left ventricle: The cavity size was normal. There was moderate concentric hypertrophy. Systolic function was mildly to moderately reduced. The estimated ejection fraction was in the range of 40% to 45%. Regional wall motion abnormalities:  Severe hypokinesis of the apicalanteroseptal, inferior, and inferoseptal myocardium. Features are consistent with a pseudonormal left ventricular filling pattern, with concomitant abnormal relaxation and increased filling pressure (grade 2 diastolic dysfunction).  ------------------------------------------------------------------- Aortic valve:  Structurally normal valve.  Cusp separation was normal. Sclerosis without stenosis. Doppler: Transvalvular velocity was within the normal range. There was no stenosis. There was no regurgitation.  ------------------------------------------------------------------- Aorta: Aortic root: The aortic root was normal in size. Ascending aorta: The ascending aorta was normal in size.  ------------------------------------------------------------------- Mitral valve:  Calcified annulus. Leaflet separation was normal. Doppler: Transvalvular velocity was  within the normal range. There was no evidence for stenosis. There was mild regurgitation.  Peak gradient (D): 4 mm Hg.  ------------------------------------------------------------------- Left atrium: The atrium was moderately dilated.  ------------------------------------------------------------------- Right ventricle: The cavity size was normal. Wall thickness was normal. Systolic function was normal.  ------------------------------------------------------------------- Pulmonic valve:  Structurally normal valve.  Cusp separation was normal. Doppler: Transvalvular velocity was within the normal range. There was no regurgitation.  ------------------------------------------------------------------- Tricuspid valve:  Structurally normal valve.  Leaflet separation was normal. Doppler: Transvalvular velocity was within the normal range. There was no regurgitation.  ------------------------------------------------------------------- Right atrium: The atrium was normal in size.  ------------------------------------------------------------------- Pericardium: There was no pericardial effusion.  ------------------------------------------------------------------- Systemic veins: Inferior vena cava: The vessel was normal in size. The respirophasic diameter changes were in the normal range (= 50%), consistent with normal central venous pressure.  ------------------------------------------------------------------- Measurements  Left ventricle             Value    Reference LV ID, ED, PLAX chordal        43  mm   43 - 52 LV ID, ES, PLAX chordal        32.8 mm   23 - 38 LV fx shortening, PLAX chordal (L)   24  %   >=29 LV PW thickness, ED          13.8 mm   --------- IVS/LV PW ratio, ED          0.93     <=1.3 Stroke volume, 2D           46  ml   --------- Stroke volume/bsa, 2D          22  ml/m^2 --------- LV e&', lateral             8.76 cm/s  --------- LV E/e&', lateral            11.76    --------- LV e&', medial             4.91 cm/s  --------- LV E/e&', medial            20.98    --------- LV e&', average             6.84 cm/s  --------- LV E/e&', average  15.07    ---------  Ventricular septum           Value    Reference IVS thickness, ED           12.9 mm   ---------  LVOT                  Value    Reference LVOT ID, S               19  mm   --------- LVOT area               2.84 cm^2  --------- LVOT peak velocity, S         86.7 cm/s  --------- LVOT mean velocity, S         60  cm/s  --------- LVOT VTI, S              16.3 cm   --------- LVOT peak gradient, S         3   mm Hg ---------  Aorta                 Value    Reference Aortic root ID, ED           32  mm   ---------  Left atrium              Value    Reference LA ID, A-P, ES             44  mm   --------- LA ID/bsa, A-P             2.06 cm/m^2 <=2.2 LA volume, S              89.5 ml   --------- LA volume/bsa, S            41.9 ml/m^2 --------- LA volume, ES, 1-p A4C         95.3 ml   --------- LA volume/bsa, ES, 1-p A4C       44.6 ml/m^2 --------- LA volume, ES, 1-p A2C         69.8 ml   --------- LA volume/bsa, ES, 1-p A2C       32.7 ml/m^2 ---------  Mitral valve              Value    Reference Mitral E-wave peak velocity      103  cm/s  --------- Mitral A-wave peak velocity      43.2 cm/s  --------- Mitral deceleration time         187  ms   150 - 230 Mitral peak gradient, D        4   mm Hg --------- Mitral E/A ratio, peak         2.4     ---------  Systemic veins             Value    Reference Estimated CVP             3   mm Hg ---------  Right ventricle            Value    Reference RV s&', lateral, S           9.81 cm/s  ---------  Legend: (L) and (H) mark values outside specified reference range.  ------------------------------------------------------------------- Prepared and Electronically Authenticated by  Sanda Klein, MD 2015-11-22T14:24:36   Assessment/Plan:  He has severe multi-vessel coronary disease with moderate  LV dysfunction presenting with congestive heart failure symptoms. I agree that CABG is the best treatment for this patient to prevent further loss of myocardium and recurrent congestive heart failure. I discussed the operative procedure with the patient and his wife including alternatives, benefits and risks; including but not limited to bleeding, blood transfusion, infection, stroke, myocardial infarction, graft failure, heart block requiring a permanent pacemaker, organ dysfunction, and death.  Dickson Slight understands and agrees to proceed.  He was started on Plavix but that has been stopped. We will schedule surgery for Friday.  BARTLE,BRYAN K 01/24/2014, 8:51 PM

## 2014-01-25 ENCOUNTER — Inpatient Hospital Stay (HOSPITAL_COMMUNITY): Payer: Medicare Other

## 2014-01-25 LAB — PULMONARY FUNCTION TEST
FEF 25-75 PRE: 3.43 L/s
FEF 25-75 Post: 3.29 L/sec
FEF2575-%Change-Post: -3 %
FEF2575-%PRED-POST: 130 %
FEF2575-%Pred-Pre: 136 %
FEV1-%Change-Post: 3 %
FEV1-%Pred-Post: 85 %
FEV1-%Pred-Pre: 82 %
FEV1-Post: 2.72 L
FEV1-Pre: 2.62 L
FEV1FVC-%Change-Post: 4 %
FEV1FVC-%Pred-Pre: 113 %
FEV6-%Change-Post: 0 %
FEV6-%PRED-PRE: 76 %
FEV6-%Pred-Post: 76 %
FEV6-POST: 3.07 L
FEV6-Pre: 3.09 L
FEV6FVC-%PRED-POST: 105 %
FEV6FVC-%Pred-Pre: 105 %
FVC-%Change-Post: 0 %
FVC-%PRED-PRE: 72 %
FVC-%Pred-Post: 71 %
FVC-PRE: 3.09 L
FVC-Post: 3.07 L
POST FEV6/FVC RATIO: 100 %
PRE FEV1/FVC RATIO: 85 %
PRE FEV6/FVC RATIO: 100 %
Post FEV1/FVC ratio: 89 %

## 2014-01-25 LAB — CBC
HCT: 40.1 % (ref 39.0–52.0)
HEMOGLOBIN: 12.5 g/dL — AB (ref 13.0–17.0)
MCH: 26.4 pg (ref 26.0–34.0)
MCHC: 31.2 g/dL (ref 30.0–36.0)
MCV: 84.8 fL (ref 78.0–100.0)
Platelets: 351 10*3/uL (ref 150–400)
RBC: 4.73 MIL/uL (ref 4.22–5.81)
RDW: 14.3 % (ref 11.5–15.5)
WBC: 7.2 10*3/uL (ref 4.0–10.5)

## 2014-01-25 LAB — HEPARIN LEVEL (UNFRACTIONATED): HEPARIN UNFRACTIONATED: 0.6 [IU]/mL (ref 0.30–0.70)

## 2014-01-25 MED ORDER — ALBUTEROL SULFATE (2.5 MG/3ML) 0.083% IN NEBU
2.5000 mg | INHALATION_SOLUTION | Freq: Once | RESPIRATORY_TRACT | Status: AC
  Administered 2014-01-25: 2.5 mg via RESPIRATORY_TRACT

## 2014-01-25 MED ORDER — ENALAPRIL MALEATE 20 MG PO TABS
20.0000 mg | ORAL_TABLET | Freq: Every day | ORAL | Status: DC
  Administered 2014-01-25 – 2014-01-26 (×2): 20 mg via ORAL
  Filled 2014-01-25 (×3): qty 1

## 2014-01-25 NOTE — Progress Notes (Signed)
SUBJECTIVE:  Continues to feel well, he has had no further chest pain, has been up to bathroom and chair.  OBJECTIVE:   Vitals:   Filed Vitals:   01/25/14 1000 01/25/14 1100 01/25/14 1158 01/25/14 1200  BP:   139/81 139/81  Pulse: 71 72 70 70  Temp:   98.8 F (37.1 C)   TempSrc:   Oral   Resp: 12 14 15 15   Height:      Weight:      SpO2: 97% 98% 98% 100%   I&O's:    Intake/Output Summary (Last 24 hours) at 01/25/14 1321 Last data filed at 01/25/14 1100  Gross per 24 hour  Intake   1254 ml  Output    400 ml  Net    854 ml   TELEMETRY: Reviewed telemetry pt in NSR:   PHYSICAL EXAM General: Well developed, well nourished, in no acute distress Head:   Normal cephalic and atramatic  Lungs:  Clear bilaterally to auscultation. Heart:  HRRR S1 S2  No JVD.   Abdomen: abdomen soft and non-tender Msk:  Back normal,  Normal strength and tone for age. Extremities:  No edema.   Neuro: Alert and oriented. Psych:  Normal affect, responds appropriately Skin: No rash   LABS: Basic Metabolic Panel:  Recent Labs  01/23/14 0450  NA 139  K 4.3  CL 99  CO2 27  GLUCOSE 110*  BUN 20  CREATININE 1.13  CALCIUM 9.9   Liver Function Tests:  Recent Labs  01/24/14 0256  AST 19  ALT 17  ALKPHOS 70  BILITOT 0.4  PROT 7.0  ALBUMIN 3.1*   No results for input(s): LIPASE, AMYLASE in the last 72 hours. CBC:  Recent Labs  01/24/14 0256 01/25/14 0354  WBC 6.9 7.2  HGB 13.2 12.5*  HCT 41.3 40.1  MCV 86.6 84.8  PLT 368 351   Cardiac Enzymes: No results for input(s): CKTOTAL, CKMB, CKMBINDEX, TROPONINI in the last 72 hours. BNP: Invalid input(s): POCBNP D-Dimer: No results for input(s): DDIMER in the last 72 hours. Hemoglobin A1C:  Recent Labs  01/23/14 0450  HGBA1C 6.4*   Fasting Lipid Panel:  Recent Labs  01/24/14 0256  CHOL 212*  HDL 23*  LDLCALC 139*  TRIG 252*  CHOLHDL 9.2   Thyroid Function Tests: No results for input(s): TSH, T4TOTAL, T3FREE,  THYROIDAB in the last 72 hours.  Invalid input(s): FREET3 Anemia Panel: No results for input(s): VITAMINB12, FOLATE, FERRITIN, TIBC, IRON, RETICCTPCT in the last 72 hours. Coag Panel:   Lab Results  Component Value Date   INR 1.10 01/21/2014    RADIOLOGY: Dg Chest 2 View  01/21/2014   CLINICAL DATA:  Chest discomfort and high blood pressure after starting on prednisone yesterday for gout. New onset cough.  EXAM: CHEST  2 VIEW  COMPARISON:  01/09/2014  FINDINGS: Cardiac enlargement with interstitial and perihilar airspace infiltration suggesting edema or pneumonia. Perihilar infiltrates have increased since previous study. No blunting of costophrenic angles. No pneumothorax.  IMPRESSION: Increasing perihilar infiltrates since previous study suggesting progression of edema or pneumonia.   Electronically Signed   By: Lucienne Capers M.D.   On: 01/21/2014 05:45   Dg Chest 2 View  01/09/2014   CLINICAL DATA:  Cough for 2 months  EXAM: CHEST  2 VIEW  COMPARISON:  None  FINDINGS: Enlargement of cardiac silhouette with pulmonary vascular congestion.  Mediastinal contours normal.  Peribronchial thickening with accentuation of interstitial markings in a perihilar regions with  associated Kerley B-lines at the lung bases favor mild pulmonary edema.  No segmental consolidation, pleural effusion or pneumothorax.  Mild scattered endplate spur formation thoracic spine.  IMPRESSION: Enlargement of cardiac silhouette with pulmonary vascular congestion and probable mild pulmonary edema.   Electronically Signed   By: Lavonia Dana M.D.   On: 01/09/2014 10:53      ASSESSMENT/PLAN:     NSTEMI (non-ST elevated myocardial infarction)/ severe 3 vessel CAD - Cardiac cath on 11/23 by Dr. Burt Knack revealed severe 3 vessel disease.  Patient to be evaluated today for CABG by CVTS. - Continue Heparin - Patient received Plavix on 11/21-11/23 was discontinued on 11/23 when cardiac cath showed 3 vessel disease.   - Dr. [name]  evaluated patient, plans for CABG on Friday. - ASA, Atorvastatin 80mg , Coreg 6.25 BID, Enalapril 20mg     Systolic and diastolic CHF, acute - EF 22-97% + WMA by echo 01/22/14 - Weight 205 - Currently appears euvolemic.    HTN (hypertension) - Continue Coreg. - Increase enalapril to 20mg  daily.    Prediabetes - A1c 6.4, discussed importance of weight loss, diet and exercise in the future to prevent the development of diabetes.  Will need to establish with a PCP.  HLD - LDL 139 - Currently on Atorvastatin, will need statin at discharge.    Lucious Groves, DO  01/25/2014  1:21 PM  History and all data above reviewed.  Patient examined.  I agree with the findings as above.  No further chest pain The patient exam reveals COR:RRR  ,  Lungs: Clear  ,  Abd: Positive bowel sounds, no rebound no guarding, Ext No edema  .  All available labs, radiology testing, previous records reviewed. Agree with documented assessment and plan. CAD for CABG Friday.  Long discussion today with him and his wife about the surgery and secondary risk reduction.  Increase Enalapril today.    Jeneen Rinks Demichael Traum  4:27 PM  01/25/2014

## 2014-01-25 NOTE — Plan of Care (Signed)
Problem: Consults Goal: Cardiac Surgery Patient Education ( See Patient Education module for education specifics.) Outcome: Progressing Goal: Cardiac Rehab Consult Outcome: Completed/Met Date Met:  01/25/14 Goal: Skin Care Protocol Initiated - if Braden Score 18 or less If consults are not indicated, leave blank or document N/A Outcome: Completed/Met Date Met:  01/25/14 Goal: Tobacco Cessation referral if indicated Outcome: Not Applicable Date Met:  14/99/69 Goal: Nutrition Consult-if indicated Outcome: Not Applicable Date Met:  24/93/24 Goal: Diabetes Guidelines if Diabetic/Glucose > 140 If diabetic or lab glucose is > 140 mg/dl - Initiate Diabetes/Hyperglycemia Guidelines & Document Interventions  Outcome: Not Applicable Date Met:  19/91/44  Problem: Phase I - Pre-Op Goal: Point person for discharge identified Outcome: Completed/Met Date Met:  01/25/14 Goal: Pain controlled with appropriate interventions Outcome: Completed/Met Date Met:  01/25/14

## 2014-01-25 NOTE — Progress Notes (Signed)
ANTICOAGULATION CONSULT NOTE - Follow Up Consult  Pharmacy Consult for Heparin Indication: NSTEMI/CABG  Allergies  Allergen Reactions  . Penicillins Anaphylaxis    Patient Measurements: Height: 5\' 8"  (172.7 cm) Weight: 205 lb 11 oz (93.3 kg) IBW/kg (Calculated) : 68.4 Heparin Dosing Weight: 87.6kg  Vital Signs: Temp: 98.5 F (36.9 C) (11/25 0400) Temp Source: Oral (11/25 0400) BP: 166/93 mmHg (11/25 0400) Pulse Rate: 71 (11/25 0700)  Labs:  Recent Labs  01/23/14 0450 01/24/14 0256 01/24/14 1100 01/24/14 1630 01/25/14 0354  HGB 14.0 13.2  --   --  12.5*  HCT 44.4 41.3  --   --  40.1  PLT 400 368  --   --  351  HEPARINUNFRC 0.47 0.23* 0.35 0.44 0.60  CREATININE 1.13  --   --   --   --     Estimated Creatinine Clearance: 72.3 mL/min (by C-G formula based on Cr of 1.13).   Medications:  Scheduled:  . aspirin EC  81 mg Oral Daily  . atorvastatin  80 mg Oral q1800  . carvedilol  6.25 mg Oral BID WC  . enalapril  10 mg Oral Daily  . multivitamin with minerals  1 tablet Oral Daily  . sodium chloride  3 mL Intravenous Q12H   Infusions:  . heparin 2,000 Units/hr (01/24/14 2231)   PRN: sodium chloride, acetaminophen, ALPRAZolam, metoprolol, nitroGLYCERIN, ondansetron (ZOFRAN) IV, sodium chloride, zolpidem  Assessment: 89 YOM admitted on 11/21 w/o CP s/p cath showing severe 3 vessel disease now awaiting CABG. HL remains therapeutic at 0.60 on heparin 2000 units/hr. Hgb has decreased since 11/23, 14>12.5, HCT and Plt WNL with no reported s/s bleeding. HL has consistently trended up 0.35<0.44<0.6. Will be more conservative with heparin dose to prevent becoming supratherapeutic.   Goal of Therapy:  Heparin level 0.3-0.7 units/ml Monitor platelets by anticoagulation protocol: Yes   Plan:  -Decreased heparin to 1950 units/hr -Monitor daily HL, H/H, Plt -Monitor for s/s of bleeding  Knute Neu 01/25/2014,7:22 AM

## 2014-01-26 DIAGNOSIS — I10 Essential (primary) hypertension: Secondary | ICD-10-CM

## 2014-01-26 LAB — URINALYSIS, ROUTINE W REFLEX MICROSCOPIC
BILIRUBIN URINE: NEGATIVE
Glucose, UA: NEGATIVE mg/dL
Hgb urine dipstick: NEGATIVE
KETONES UR: NEGATIVE mg/dL
Leukocytes, UA: NEGATIVE
Nitrite: NEGATIVE
Protein, ur: NEGATIVE mg/dL
Specific Gravity, Urine: 1.006 (ref 1.005–1.030)
UROBILINOGEN UA: 0.2 mg/dL (ref 0.0–1.0)
pH: 6.5 (ref 5.0–8.0)

## 2014-01-26 LAB — BLOOD GAS, ARTERIAL
Acid-Base Excess: 0.6 mmol/L (ref 0.0–2.0)
BICARBONATE: 24 meq/L (ref 20.0–24.0)
Drawn by: 281201
FIO2: 0.21 %
O2 Saturation: 97.7 %
PATIENT TEMPERATURE: 98.6
PH ART: 7.458 — AB (ref 7.350–7.450)
TCO2: 25.1 mmol/L (ref 0–100)
pCO2 arterial: 34.4 mmHg — ABNORMAL LOW (ref 35.0–45.0)
pO2, Arterial: 104 mmHg — ABNORMAL HIGH (ref 80.0–100.0)

## 2014-01-26 LAB — ABO/RH: ABO/RH(D): AB POS

## 2014-01-26 LAB — BASIC METABOLIC PANEL
Anion gap: 13 (ref 5–15)
BUN: 17 mg/dL (ref 6–23)
CALCIUM: 9.4 mg/dL (ref 8.4–10.5)
CO2: 25 mEq/L (ref 19–32)
Chloride: 102 mEq/L (ref 96–112)
Creatinine, Ser: 0.95 mg/dL (ref 0.50–1.35)
GFR calc Af Amer: >90 mL/min (ref >90)
GFR calc non Af Amer: 85 mL/min — ABNORMAL LOW (ref >90)
GLUCOSE: 118 mg/dL — AB (ref 70–99)
Potassium: 4.2 mEq/L (ref 3.7–5.3)
Sodium: 140 mEq/L (ref 137–147)

## 2014-01-26 LAB — CBC
HEMATOCRIT: 37.9 % — AB (ref 39.0–52.0)
Hemoglobin: 11.8 g/dL — ABNORMAL LOW (ref 13.0–17.0)
MCH: 26.5 pg (ref 26.0–34.0)
MCHC: 31.1 g/dL (ref 30.0–36.0)
MCV: 85.2 fL (ref 78.0–100.0)
Platelets: 344 10*3/uL (ref 150–400)
RBC: 4.45 MIL/uL (ref 4.22–5.81)
RDW: 14.5 % (ref 11.5–15.5)
WBC: 6.4 10*3/uL (ref 4.0–10.5)

## 2014-01-26 LAB — TYPE AND SCREEN
ABO/RH(D): AB POS
Antibody Screen: NEGATIVE

## 2014-01-26 LAB — PROTIME-INR
INR: 1.12 (ref 0.00–1.49)
PROTHROMBIN TIME: 14.5 s (ref 11.6–15.2)

## 2014-01-26 LAB — APTT: APTT: 64 s — AB (ref 24–37)

## 2014-01-26 LAB — HEPARIN LEVEL (UNFRACTIONATED): Heparin Unfractionated: 0.42 IU/mL (ref 0.30–0.70)

## 2014-01-26 MED ORDER — CHLORHEXIDINE GLUCONATE CLOTH 2 % EX PADS
6.0000 | MEDICATED_PAD | Freq: Once | CUTANEOUS | Status: AC
  Administered 2014-01-27: 6 via TOPICAL

## 2014-01-26 MED ORDER — SODIUM CHLORIDE 0.9 % IV SOLN
INTRAVENOUS | Status: AC
  Administered 2014-01-27: 69.8 mL/h via INTRAVENOUS
  Filled 2014-01-26: qty 40

## 2014-01-26 MED ORDER — PLASMA-LYTE 148 IV SOLN
INTRAVENOUS | Status: AC
  Administered 2014-01-27: 500 mL
  Filled 2014-01-26: qty 2.5

## 2014-01-26 MED ORDER — MAGNESIUM SULFATE 50 % IJ SOLN
40.0000 meq | INTRAMUSCULAR | Status: DC
  Filled 2014-01-26: qty 10

## 2014-01-26 MED ORDER — SODIUM CHLORIDE 0.9 % IV SOLN
INTRAVENOUS | Status: AC
  Administered 2014-01-27: 1 [IU]/h via INTRAVENOUS
  Filled 2014-01-26: qty 2.5

## 2014-01-26 MED ORDER — DIAZEPAM 5 MG PO TABS
5.0000 mg | ORAL_TABLET | Freq: Once | ORAL | Status: AC
  Administered 2014-01-27: 5 mg via ORAL
  Filled 2014-01-26: qty 1

## 2014-01-26 MED ORDER — NITROGLYCERIN IN D5W 200-5 MCG/ML-% IV SOLN
2.0000 ug/min | INTRAVENOUS | Status: AC
  Administered 2014-01-27: 5 ug/min via INTRAVENOUS
  Filled 2014-01-26: qty 250

## 2014-01-26 MED ORDER — EPINEPHRINE HCL 1 MG/ML IJ SOLN
0.0000 ug/min | INTRAMUSCULAR | Status: DC
  Filled 2014-01-26: qty 4

## 2014-01-26 MED ORDER — PHENYLEPHRINE HCL 10 MG/ML IJ SOLN
30.0000 ug/min | INTRAVENOUS | Status: DC
  Filled 2014-01-26: qty 2

## 2014-01-26 MED ORDER — CHLORHEXIDINE GLUCONATE CLOTH 2 % EX PADS
6.0000 | MEDICATED_PAD | Freq: Once | CUTANEOUS | Status: AC
  Administered 2014-01-26: 6 via TOPICAL

## 2014-01-26 MED ORDER — POTASSIUM CHLORIDE 2 MEQ/ML IV SOLN
80.0000 meq | INTRAVENOUS | Status: DC
  Filled 2014-01-26: qty 40

## 2014-01-26 MED ORDER — DEXMEDETOMIDINE HCL IN NACL 400 MCG/100ML IV SOLN
0.1000 ug/kg/h | INTRAVENOUS | Status: AC
Start: 2014-01-27 — End: 2014-01-27
  Administered 2014-01-27: 0.7 ug/kg/h via INTRAVENOUS
  Administered 2014-01-27: .3 ug/kg/h via INTRAVENOUS
  Filled 2014-01-26: qty 100

## 2014-01-26 MED ORDER — LEVOFLOXACIN IN D5W 500 MG/100ML IV SOLN
500.0000 mg | INTRAVENOUS | Status: AC
  Administered 2014-01-27: 500 mg via INTRAVENOUS
  Filled 2014-01-26 (×2): qty 100

## 2014-01-26 MED ORDER — FUROSEMIDE 10 MG/ML IJ SOLN
40.0000 mg | Freq: Once | INTRAMUSCULAR | Status: AC
  Administered 2014-01-26: 40 mg via INTRAVENOUS
  Filled 2014-01-26: qty 4

## 2014-01-26 MED ORDER — SODIUM CHLORIDE 0.9 % IV SOLN
INTRAVENOUS | Status: DC
  Filled 2014-01-26: qty 30

## 2014-01-26 MED ORDER — DOPAMINE-DEXTROSE 3.2-5 MG/ML-% IV SOLN
0.0000 ug/kg/min | INTRAVENOUS | Status: DC
  Filled 2014-01-26: qty 250

## 2014-01-26 MED ORDER — BISACODYL 5 MG PO TBEC
5.0000 mg | DELAYED_RELEASE_TABLET | Freq: Once | ORAL | Status: AC
  Administered 2014-01-26: 5 mg via ORAL
  Filled 2014-01-26: qty 1

## 2014-01-26 MED ORDER — ALPRAZOLAM 0.25 MG PO TABS
0.2500 mg | ORAL_TABLET | ORAL | Status: DC | PRN
  Administered 2014-01-26: 0.5 mg via ORAL
  Filled 2014-01-26: qty 2

## 2014-01-26 MED ORDER — VANCOMYCIN HCL 10 G IV SOLR
1500.0000 mg | INTRAVENOUS | Status: AC
  Administered 2014-01-27: 1500 mg via INTRAVENOUS
  Filled 2014-01-26: qty 1500

## 2014-01-26 NOTE — Progress Notes (Signed)
ANTICOAGULATION CONSULT NOTE - Follow Up Consult  Pharmacy Consult for Heparin Indication: NSTEMI/CABG  Allergies  Allergen Reactions  . Penicillins Anaphylaxis    Patient Measurements: Height: 5\' 8"  (172.7 cm) Weight: 207 lb 12.8 oz (94.257 kg) IBW/kg (Calculated) : 68.4 Heparin Dosing Weight: 87.6kg  Vital Signs: Temp: 98.7 F (37.1 C) (11/26 0744) Temp Source: Oral (11/26 0744) BP: 164/84 mmHg (11/26 0744) Pulse Rate: 74 (11/26 0744)  Labs:  Recent Labs  01/24/14 0256  01/24/14 1630 01/25/14 0354 01/26/14 0223 01/26/14 0232  HGB 13.2  --   --  12.5*  --  11.8*  HCT 41.3  --   --  40.1  --  37.9*  PLT 368  --   --  351  --  344  HEPARINUNFRC 0.23*  < > 0.44 0.60 0.42  --   CREATININE  --   --   --   --   --  0.95  < > = values in this interval not displayed.  Estimated Creatinine Clearance: 86.4 mL/min (by C-G formula based on Cr of 0.95).   Medications:  Scheduled:  . aspirin EC  81 mg Oral Daily  . atorvastatin  80 mg Oral q1800  . carvedilol  6.25 mg Oral BID WC  . enalapril  20 mg Oral Daily  . multivitamin with minerals  1 tablet Oral Daily  . sodium chloride  3 mL Intravenous Q12H   Infusions:  . heparin 1,950 Units/hr (01/26/14 0140)   PRN: sodium chloride, acetaminophen, metoprolol, nitroGLYCERIN, ondansetron (ZOFRAN) IV, sodium chloride, zolpidem  Assessment: 15 YOM admitted on 11/21 w/o CP s/p cath showing severe 3 vessel disease now awaiting CABG. HL remains therapeutic at 0.42. Hgb has decreased since 11/23, 14>11.8, Plt remains WNL and stable with no reported s/s bleeding.   Goal of Therapy:  Heparin level 0.3-0.7 units/ml Monitor platelets by anticoagulation protocol: Yes   Plan:  - Continue hep gtt at [place] u/hr - Daily HL/CBC - Monitor H/H trend and s/s bleeding - F/u plans after CABG on Friday  Derek [name] K. Velva Harman, PharmD Clinical Pharmacist - Resident Pager: [phone] Pharmacy: [phone] 01/26/2014 10:46 AM

## 2014-01-26 NOTE — Plan of Care (Signed)
Problem: Phase I - Pre-Op Goal: Pre-Op Education completed Outcome: Completed/Met Date Met:  01/26/14

## 2014-01-26 NOTE — Progress Notes (Addendum)
SUBJECTIVE:   No further chest pain. Some cough today, slightly lower peak flows after PFT's yesterday.  OBJECTIVE:   Vitals:   Filed Vitals:   01/25/14 1900 01/25/14 1958 01/26/14 0000 01/26/14 0300  BP:  107/55 137/88 132/78  Pulse: 78 70 78 73  Temp:  98.5 F (36.9 C) 98.3 F (36.8 C) 98.4 F (36.9 C)  TempSrc:  Oral Oral Oral  Resp: 16 18 23 13   Height:      Weight:    207 lb 12.8 oz (94.257 kg)  SpO2: 100% 98% 98% 99%   I&O's:    Intake/Output Summary (Last 24 hours) at 01/26/14 3007 Last data filed at 01/26/14 0600  Gross per 24 hour  Intake 1417.5 ml  Output   1125 ml  Net  292.5 ml   TELEMETRY: Reviewed telemetry pt in NSR:   PHYSICAL EXAM General: Well developed, well nourished, in no acute distress Head:   Normal cephalic and atramatic  Lungs:  bibaslar rales Heart:  HRRR S1 S2  No JVD.   Abdomen: abdomen soft and non-tender Msk:  Back normal,  Normal strength and tone for age. Extremities:  No edema.   Neuro: Alert and oriented. Psych:  Normal affect, responds appropriately Skin: No rash   LABS: Basic Metabolic Panel:  Recent Labs  01/26/14 0232  NA 140  K 4.2  CL 102  CO2 25  GLUCOSE 118*  BUN 17  CREATININE 0.95  CALCIUM 9.4   Liver Function Tests:  Recent Labs  01/24/14 0256  AST 19  ALT 17  ALKPHOS 70  BILITOT 0.4  PROT 7.0  ALBUMIN 3.1*   No results for input(s): LIPASE, AMYLASE in the last 72 hours. CBC:  Recent Labs  01/25/14 0354 01/26/14 0232  WBC 7.2 6.4  HGB 12.5* 11.8*  HCT 40.1 37.9*  MCV 84.8 85.2  PLT 351 344   Cardiac Enzymes: No results for input(s): CKTOTAL, CKMB, CKMBINDEX, TROPONINI in the last 72 hours. BNP: Invalid input(s): POCBNP D-Dimer: No results for input(s): DDIMER in the last 72 hours. Hemoglobin A1C: No results for input(s): HGBA1C in the last 72 hours. Fasting Lipid Panel:  Recent Labs  01/24/14 0256  CHOL 212*  HDL 23*  LDLCALC 139*  TRIG 252*  CHOLHDL 9.2   Thyroid  Function Tests: No results for input(s): TSH, T4TOTAL, T3FREE, THYROIDAB in the last 72 hours.  Invalid input(s): FREET3 Anemia Panel: No results for input(s): VITAMINB12, FOLATE, FERRITIN, TIBC, IRON, RETICCTPCT in the last 72 hours. Coag Panel:   Lab Results  Component Value Date   INR 1.10 01/21/2014    RADIOLOGY: Dg Chest 2 View  01/21/2014   CLINICAL DATA:  Chest discomfort and high blood pressure after starting on prednisone yesterday for gout. New onset cough.  EXAM: CHEST  2 VIEW  COMPARISON:  01/09/2014  FINDINGS: Cardiac enlargement with interstitial and perihilar airspace infiltration suggesting edema or pneumonia. Perihilar infiltrates have increased since previous study. No blunting of costophrenic angles. No pneumothorax.  IMPRESSION: Increasing perihilar infiltrates since previous study suggesting progression of edema or pneumonia.   Electronically Signed   By: Lucienne Capers M.D.   On: 01/21/2014 05:45   Dg Chest 2 View  01/09/2014   CLINICAL DATA:  Cough for 2 months  EXAM: CHEST  2 VIEW  COMPARISON:  None  FINDINGS: Enlargement of cardiac silhouette with pulmonary vascular congestion.  Mediastinal contours normal.  Peribronchial thickening with accentuation of interstitial markings in a perihilar regions with  associated Kerley B-lines at the lung bases favor mild pulmonary edema.  No segmental consolidation, pleural effusion or pneumothorax.  Mild scattered endplate spur formation thoracic spine.  IMPRESSION: Enlargement of cardiac silhouette with pulmonary vascular congestion and probable mild pulmonary edema.   Electronically Signed   By: Lavonia Dana M.D.   On: 01/09/2014 10:53      ASSESSMENT/PLAN:     NSTEMI (non-ST elevated myocardial infarction)/ severe 3 vessel CAD - Cardiac cath on 11/23 by Dr. Burt Knack revealed severe 3 vessel disease.  Patient to be evaluated today for CABG by CVTS. - Continue Heparin - Patient received Plavix on 11/21-11/23 was discontinued on  11/23 when cardiac cath showed 3 vessel disease.   - Dr. [name] evaluated patient, plans for CABG on Friday. - ASA, Atorvastatin 80mg , Coreg 6.25 BID, Enalapril 20mg     Systolic and diastolic CHF, acute - EF 53-66% + WMA by echo 01/22/14 - Weight 205 - Bibasilar rales today with cough, weight up 2 lbs - give additional lasix 40 mg IV today    HTN (hypertension) - Continue Coreg. - Increase enalapril to 20mg  daily.    Prediabetes - A1c 6.4, discussed importance of weight loss, diet and exercise in the future to prevent the development of diabetes.  Will need to establish with a PCP.  HLD - LDL 139 - Currently on Atorvastatin, will need statin at discharge.  He is ready for surgery surgery tomorrow.  [name], MD, [facility] Attending Cardiologist [facility]  [name], MD  01/26/2014  7:21 AM

## 2014-01-27 ENCOUNTER — Inpatient Hospital Stay (HOSPITAL_COMMUNITY): Payer: Medicare Other

## 2014-01-27 ENCOUNTER — Inpatient Hospital Stay (HOSPITAL_COMMUNITY): Payer: Medicare Other | Admitting: Certified Registered Nurse Anesthetist

## 2014-01-27 ENCOUNTER — Encounter (HOSPITAL_COMMUNITY)
Admission: EM | Disposition: A | Discharge status: Home / Self Care | Payer: Medicare Other | Source: Home / Self Care | Attending: Cardiology

## 2014-01-27 ENCOUNTER — Encounter (HOSPITAL_COMMUNITY): Payer: Self-pay | Admitting: Certified Registered Nurse Anesthetist

## 2014-01-27 DIAGNOSIS — Z951 Presence of aortocoronary bypass graft: Secondary | ICD-10-CM

## 2014-01-27 HISTORY — PX: TEE WITHOUT CARDIOVERSION: SHX5443

## 2014-01-27 HISTORY — PX: CORONARY ARTERY BYPASS GRAFT: SHX141

## 2014-01-27 LAB — CBC
HCT: 38.8 % — ABNORMAL LOW (ref 39.0–52.0)
HCT: 34.7 % — ABNORMAL LOW (ref 39.0–52.0)
HCT: 34.3 % — ABNORMAL LOW (ref 39.0–52.0)
HEMOGLOBIN: 12.1 g/dL — AB (ref 13.0–17.0)
HEMOGLOBIN: 10.9 g/dL — AB (ref 13.0–17.0)
Hemoglobin: 11.2 g/dL — ABNORMAL LOW (ref 13.0–17.0)
MCH: 26.4 pg (ref 26.0–34.0)
MCH: 27.4 pg (ref 26.0–34.0)
MCH: 27 pg (ref 26.0–34.0)
MCHC: 31.2 g/dL (ref 30.0–36.0)
MCHC: 32.3 g/dL (ref 30.0–36.0)
MCHC: 31.8 g/dL (ref 30.0–36.0)
MCV: 84.7 fL (ref 78.0–100.0)
MCV: 84.8 fL (ref 78.0–100.0)
MCV: 85.1 fL (ref 78.0–100.0)
PLATELETS: 204 10*3/uL (ref 150–400)
PLATELETS: 193 10*3/uL (ref 150–400)
Platelets: 330 10*3/uL (ref 150–400)
RBC: 4.58 MIL/uL (ref 4.22–5.81)
RBC: 4.09 MIL/uL — ABNORMAL LOW (ref 4.22–5.81)
RBC: 4.03 MIL/uL — ABNORMAL LOW (ref 4.22–5.81)
RDW: 14.7 % (ref 11.5–15.5)
RDW: 14.4 % (ref 11.5–15.5)
RDW: 14.4 % (ref 11.5–15.5)
WBC: 6.7 10*3/uL (ref 4.0–10.5)
WBC: 9.6 10*3/uL (ref 4.0–10.5)
WBC: 9.9 10*3/uL (ref 4.0–10.5)

## 2014-01-27 LAB — POCT I-STAT 3, ART BLOOD GAS (G3+)
ACID-BASE DEFICIT: 2 mmol/L (ref 0.0–2.0)
ACID-BASE DEFICIT: 2 mmol/L (ref 0.0–2.0)
ACID-BASE EXCESS: 1 mmol/L (ref 0.0–2.0)
Acid-base deficit: 2 mmol/L (ref 0.0–2.0)
Bicarbonate: 26.6 mEq/L — ABNORMAL HIGH (ref 20.0–24.0)
Bicarbonate: 24.5 mEq/L — ABNORMAL HIGH (ref 20.0–24.0)
Bicarbonate: 21.8 mEq/L (ref 20.0–24.0)
Bicarbonate: 22.2 mEq/L (ref 20.0–24.0)
Bicarbonate: 22.8 mEq/L (ref 20.0–24.0)
O2 SAT: 100 %
O2 SAT: 90 %
O2 Saturation: 100 %
O2 Saturation: 94 %
O2 Saturation: 97 %
PH ART: 7.408 (ref 7.350–7.450)
PH ART: 7.386 (ref 7.350–7.450)
TCO2: 28 mmol/L (ref 0–100)
TCO2: 26 mmol/L (ref 0–100)
TCO2: 23 mmol/L (ref 0–100)
TCO2: 23 mmol/L (ref 0–100)
TCO2: 24 mmol/L (ref 0–100)
pCO2 arterial: 43.5 mmHg (ref 35.0–45.0)
pCO2 arterial: 38.9 mmHg (ref 35.0–45.0)
pCO2 arterial: 33.1 mmHg — ABNORMAL LOW (ref 35.0–45.0)
pCO2 arterial: 37 mmHg (ref 35.0–45.0)
pCO2 arterial: 36.7 mmHg (ref 35.0–45.0)
pH, Arterial: 7.395 (ref 7.350–7.450)
pH, Arterial: 7.426 (ref 7.350–7.450)
pH, Arterial: 7.4 (ref 7.350–7.450)
pO2, Arterial: 391 mmHg — ABNORMAL HIGH (ref 80.0–100.0)
pO2, Arterial: 184 mmHg — ABNORMAL HIGH (ref 80.0–100.0)
pO2, Arterial: 57 mmHg — ABNORMAL LOW (ref 80.0–100.0)
pO2, Arterial: 72 mmHg — ABNORMAL LOW (ref 80.0–100.0)
pO2, Arterial: 86 mmHg (ref 80.0–100.0)

## 2014-01-27 LAB — POCT I-STAT, CHEM 8
BUN: 14 mg/dL (ref 6–23)
BUN: 12 mg/dL (ref 6–23)
BUN: 12 mg/dL (ref 6–23)
BUN: 13 mg/dL (ref 6–23)
BUN: 13 mg/dL (ref 6–23)
BUN: 14 mg/dL (ref 6–23)
CALCIUM ION: 1.28 mmol/L (ref 1.13–1.30)
CALCIUM ION: 1.24 mmol/L (ref 1.13–1.30)
CALCIUM ION: 1.2 mmol/L (ref 1.13–1.30)
CHLORIDE: 104 meq/L (ref 96–112)
CHLORIDE: 105 meq/L (ref 96–112)
Calcium, Ion: 1.12 mmol/L — ABNORMAL LOW (ref 1.13–1.30)
Calcium, Ion: 1.18 mmol/L (ref 1.13–1.30)
Calcium, Ion: 1.24 mmol/L (ref 1.13–1.30)
Chloride: 101 mEq/L (ref 96–112)
Chloride: 106 mEq/L (ref 96–112)
Chloride: 104 mEq/L (ref 96–112)
Chloride: 104 mEq/L (ref 96–112)
Creatinine, Ser: 0.8 mg/dL (ref 0.50–1.35)
Creatinine, Ser: 0.7 mg/dL (ref 0.50–1.35)
Creatinine, Ser: 0.7 mg/dL (ref 0.50–1.35)
Creatinine, Ser: 0.8 mg/dL (ref 0.50–1.35)
Creatinine, Ser: 0.8 mg/dL (ref 0.50–1.35)
Creatinine, Ser: 0.9 mg/dL (ref 0.50–1.35)
GLUCOSE: 114 mg/dL — AB (ref 70–99)
GLUCOSE: 110 mg/dL — AB (ref 70–99)
GLUCOSE: 124 mg/dL — AB (ref 70–99)
Glucose, Bld: 110 mg/dL — ABNORMAL HIGH (ref 70–99)
Glucose, Bld: 129 mg/dL — ABNORMAL HIGH (ref 70–99)
Glucose, Bld: 135 mg/dL — ABNORMAL HIGH (ref 70–99)
HCT: 33 % — ABNORMAL LOW (ref 39.0–52.0)
HCT: 30 % — ABNORMAL LOW (ref 39.0–52.0)
HEMATOCRIT: 38 % — AB (ref 39.0–52.0)
HEMATOCRIT: 27 % — AB (ref 39.0–52.0)
HEMATOCRIT: 30 % — AB (ref 39.0–52.0)
HEMATOCRIT: 33 % — AB (ref 39.0–52.0)
HEMOGLOBIN: 11.2 g/dL — AB (ref 13.0–17.0)
HEMOGLOBIN: 9.2 g/dL — AB (ref 13.0–17.0)
HEMOGLOBIN: 10.2 g/dL — AB (ref 13.0–17.0)
Hemoglobin: 12.9 g/dL — ABNORMAL LOW (ref 13.0–17.0)
Hemoglobin: 10.2 g/dL — ABNORMAL LOW (ref 13.0–17.0)
Hemoglobin: 11.2 g/dL — ABNORMAL LOW (ref 13.0–17.0)
POTASSIUM: 5 meq/L (ref 3.7–5.3)
POTASSIUM: 4.5 meq/L (ref 3.7–5.3)
POTASSIUM: 4.4 meq/L (ref 3.7–5.3)
Potassium: 3.7 mEq/L (ref 3.7–5.3)
Potassium: 3.8 mEq/L (ref 3.7–5.3)
Potassium: 4.5 mEq/L (ref 3.7–5.3)
SODIUM: 137 meq/L (ref 137–147)
SODIUM: 140 meq/L (ref 137–147)
Sodium: 140 mEq/L (ref 137–147)
Sodium: 136 mEq/L — ABNORMAL LOW (ref 137–147)
Sodium: 138 mEq/L (ref 137–147)
Sodium: 139 mEq/L (ref 137–147)
TCO2: 24 mmol/L (ref 0–100)
TCO2: 24 mmol/L (ref 0–100)
TCO2: 25 mmol/L (ref 0–100)
TCO2: 24 mmol/L (ref 0–100)
TCO2: 22 mmol/L (ref 0–100)
TCO2: 20 mmol/L (ref 0–100)

## 2014-01-27 LAB — CREATININE, SERUM
CREATININE: 0.84 mg/dL (ref 0.50–1.35)
GFR calc non Af Amer: 90 mL/min — ABNORMAL LOW (ref >90)

## 2014-01-27 LAB — COMPREHENSIVE METABOLIC PANEL
ALT: 27 U/L (ref 0–53)
AST: 22 U/L (ref 0–37)
Albumin: 3.3 g/dL — ABNORMAL LOW (ref 3.5–5.2)
Alkaline Phosphatase: 65 U/L (ref 39–117)
Anion gap: 13 (ref 5–15)
BUN: 17 mg/dL (ref 6–23)
CALCIUM: 9.3 mg/dL (ref 8.4–10.5)
CO2: 26 meq/L (ref 19–32)
CREATININE: 0.9 mg/dL (ref 0.50–1.35)
Chloride: 102 mEq/L (ref 96–112)
GFR, EST NON AFRICAN AMERICAN: 87 mL/min — AB (ref >90)
GLUCOSE: 110 mg/dL — AB (ref 70–99)
Potassium: 3.7 mEq/L (ref 3.7–5.3)
Sodium: 141 mEq/L (ref 137–147)
TOTAL PROTEIN: 7 g/dL (ref 6.0–8.3)
Total Bilirubin: 0.4 mg/dL (ref 0.3–1.2)

## 2014-01-27 LAB — PLATELET COUNT: Platelets: 242 10*3/uL (ref 150–400)

## 2014-01-27 LAB — APTT: aPTT: 29 seconds (ref 24–37)

## 2014-01-27 LAB — GLUCOSE, CAPILLARY
GLUCOSE-CAPILLARY: 107 mg/dL — AB (ref 70–99)
GLUCOSE-CAPILLARY: 121 mg/dL — AB (ref 70–99)
GLUCOSE-CAPILLARY: 107 mg/dL — AB (ref 70–99)
GLUCOSE-CAPILLARY: 130 mg/dL — AB (ref 70–99)
GLUCOSE-CAPILLARY: 113 mg/dL — AB (ref 70–99)
Glucose-Capillary: 121 mg/dL — ABNORMAL HIGH (ref 70–99)
Glucose-Capillary: 115 mg/dL — ABNORMAL HIGH (ref 70–99)

## 2014-01-27 LAB — HEMOGLOBIN AND HEMATOCRIT, BLOOD
HEMATOCRIT: 29.7 % — AB (ref 39.0–52.0)
Hemoglobin: 9.7 g/dL — ABNORMAL LOW (ref 13.0–17.0)

## 2014-01-27 LAB — SURGICAL PCR SCREEN
MRSA, PCR: NEGATIVE
Staphylococcus aureus: POSITIVE — AB

## 2014-01-27 LAB — PROTIME-INR
INR: 1.26 (ref 0.00–1.49)
Prothrombin Time: 16 seconds — ABNORMAL HIGH (ref 11.6–15.2)

## 2014-01-27 LAB — MAGNESIUM: Magnesium: 2.7 mg/dL — ABNORMAL HIGH (ref 1.5–2.5)

## 2014-01-27 LAB — PRO B NATRIURETIC PEPTIDE: PRO B NATRI PEPTIDE: 813.7 pg/mL — AB (ref 0–125)

## 2014-01-27 LAB — HEPARIN LEVEL (UNFRACTIONATED): Heparin Unfractionated: 0.44 IU/mL (ref 0.30–0.70)

## 2014-01-27 LAB — POCT I-STAT 4, (NA,K, GLUC, HGB,HCT)
GLUCOSE: 109 mg/dL — AB (ref 70–99)
HEMATOCRIT: 34 % — AB (ref 39.0–52.0)
HEMOGLOBIN: 11.6 g/dL — AB (ref 13.0–17.0)
Potassium: 3.7 mEq/L (ref 3.7–5.3)
Sodium: 140 mEq/L (ref 137–147)

## 2014-01-27 SURGERY — CORONARY ARTERY BYPASS GRAFTING (CABG)
Anesthesia: General | Site: Chest

## 2014-01-27 MED ORDER — LACTATED RINGERS IV SOLN
INTRAVENOUS | Status: DC | PRN
  Administered 2014-01-27: 07:00:00 via INTRAVENOUS

## 2014-01-27 MED ORDER — PROPOFOL 10 MG/ML IV BOLUS
INTRAVENOUS | Status: DC | PRN
  Administered 2014-01-27: 80 mg via INTRAVENOUS

## 2014-01-27 MED ORDER — PANTOPRAZOLE SODIUM 40 MG PO TBEC
40.0000 mg | DELAYED_RELEASE_TABLET | Freq: Every day | ORAL | Status: DC
  Administered 2014-01-29 – 2014-01-31 (×3): 40 mg via ORAL
  Filled 2014-01-27 (×3): qty 1

## 2014-01-27 MED ORDER — MIDAZOLAM HCL 2 MG/2ML IJ SOLN: 2.0000 mg | INTRAMUSCULAR | Status: DC | PRN

## 2014-01-27 MED ORDER — CHLORHEXIDINE GLUCONATE CLOTH 2 % EX PADS: 6.0000 | MEDICATED_PAD | Freq: Every day | CUTANEOUS | Status: DC

## 2014-01-27 MED ORDER — LIDOCAINE HCL (CARDIAC) 20 MG/ML IV SOLN
INTRAVENOUS | Status: AC
  Filled 2014-01-27: qty 5

## 2014-01-27 MED ORDER — FENTANYL CITRATE 0.05 MG/ML IJ SOLN
INTRAMUSCULAR | Status: AC
  Filled 2014-01-27: qty 5

## 2014-01-27 MED ORDER — ACETAMINOPHEN 160 MG/5ML PO SOLN
1000.0000 mg | Freq: Four times a day (QID) | ORAL | Status: DC
  Filled 2014-01-27: qty 40

## 2014-01-27 MED ORDER — METOPROLOL TARTRATE 25 MG/10 ML ORAL SUSPENSION
12.5000 mg | Freq: Two times a day (BID) | ORAL | Status: DC
  Filled 2014-01-27 (×3): qty 5

## 2014-01-27 MED ORDER — MUPIROCIN 2 % EX OINT
1.0000 "application " | TOPICAL_OINTMENT | Freq: Two times a day (BID) | CUTANEOUS | Status: AC
  Administered 2014-01-27 – 2014-01-31 (×10): 1 via NASAL
  Filled 2014-01-27: qty 22

## 2014-01-27 MED ORDER — DEXMEDETOMIDINE HCL IN NACL 200 MCG/50ML IV SOLN: 0.0000 ug/kg/h | INTRAVENOUS | Status: DC

## 2014-01-27 MED ORDER — SODIUM CHLORIDE 0.9 % IJ SOLN
3.0000 mL | Freq: Two times a day (BID) | INTRAMUSCULAR | Status: DC
  Administered 2014-01-28 – 2014-01-29 (×3): 3 mL via INTRAVENOUS

## 2014-01-27 MED ORDER — ASPIRIN 81 MG PO CHEW: 324.0000 mg | CHEWABLE_TABLET | Freq: Every day | ORAL | Status: DC

## 2014-01-27 MED ORDER — PHENYLEPHRINE HCL 10 MG/ML IJ SOLN
20.0000 mg | INTRAVENOUS | Status: DC | PRN
  Administered 2014-01-27: 10 ug/min via INTRAVENOUS

## 2014-01-27 MED ORDER — HEPARIN SODIUM (PORCINE) 1000 UNIT/ML IJ SOLN
INTRAMUSCULAR | Status: AC
  Filled 2014-01-27: qty 3

## 2014-01-27 MED ORDER — SODIUM CHLORIDE 0.9 % IJ SOLN: 3.0000 mL | INTRAMUSCULAR | Status: DC | PRN

## 2014-01-27 MED ORDER — OXYCODONE HCL 5 MG PO TABS
5.0000 mg | ORAL_TABLET | ORAL | Status: DC | PRN
  Administered 2014-01-28 (×2): 5 mg via ORAL
  Administered 2014-01-28 – 2014-01-29 (×2): 10 mg via ORAL
  Filled 2014-01-27: qty 2
  Filled 2014-01-27: qty 1
  Filled 2014-01-27: qty 2
  Filled 2014-01-27: qty 1

## 2014-01-27 MED ORDER — ROCURONIUM BROMIDE 100 MG/10ML IV SOLN
INTRAVENOUS | Status: DC | PRN
  Administered 2014-01-27 (×4): 50 mg via INTRAVENOUS
  Administered 2014-01-27: 30 mg via INTRAVENOUS

## 2014-01-27 MED ORDER — MIDAZOLAM HCL 5 MG/5ML IJ SOLN
INTRAMUSCULAR | Status: DC | PRN
  Administered 2014-01-27: 1 mg via INTRAVENOUS
  Administered 2014-01-27 (×2): 2 mg via INTRAVENOUS
  Administered 2014-01-27: 1 mg via INTRAVENOUS
  Administered 2014-01-27: 2 mg via INTRAVENOUS
  Administered 2014-01-27 (×2): 1 mg via INTRAVENOUS

## 2014-01-27 MED ORDER — MUPIROCIN 2 % EX OINT: 1.0000 "application " | TOPICAL_OINTMENT | Freq: Two times a day (BID) | CUTANEOUS | Status: DC

## 2014-01-27 MED ORDER — ROCURONIUM BROMIDE 50 MG/5ML IV SOLN
INTRAVENOUS | Status: AC
  Filled 2014-01-27: qty 3

## 2014-01-27 MED ORDER — PHENYLEPHRINE HCL 10 MG/ML IJ SOLN
0.0000 ug/min | INTRAMUSCULAR | Status: DC
  Filled 2014-01-27: qty 2

## 2014-01-27 MED ORDER — POTASSIUM CHLORIDE 10 MEQ/50ML IV SOLN
10.0000 meq | INTRAVENOUS | Status: AC
  Administered 2014-01-27 (×3): 10 meq via INTRAVENOUS

## 2014-01-27 MED ORDER — CHLORHEXIDINE GLUCONATE CLOTH 2 % EX PADS
6.0000 | MEDICATED_PAD | Freq: Every day | CUTANEOUS | Status: DC
  Administered 2014-01-27 – 2014-01-28 (×2): 6 via TOPICAL

## 2014-01-27 MED ORDER — NITROGLYCERIN IN D5W 200-5 MCG/ML-% IV SOLN
0.0000 ug/min | INTRAVENOUS | Status: DC
  Administered 2014-01-27: 16.667 ug/min via INTRAVENOUS

## 2014-01-27 MED ORDER — DEXMEDETOMIDINE HCL IN NACL 200 MCG/50ML IV SOLN
INTRAVENOUS | Status: AC
  Filled 2014-01-27: qty 50

## 2014-01-27 MED ORDER — HEPARIN SODIUM (PORCINE) 1000 UNIT/ML IJ SOLN
INTRAMUSCULAR | Status: DC | PRN
  Administered 2014-01-27: 30000 [IU] via INTRAVENOUS

## 2014-01-27 MED ORDER — ALBUMIN HUMAN 5 % IV SOLN
250.0000 mL | INTRAVENOUS | Status: AC | PRN
  Administered 2014-01-27 (×2): 250 mL via INTRAVENOUS

## 2014-01-27 MED ORDER — PROTAMINE SULFATE 10 MG/ML IV SOLN
INTRAVENOUS | Status: DC | PRN
  Administered 2014-01-27: 15 mg via INTRAVENOUS
  Administered 2014-01-27: 75 mg via INTRAVENOUS
  Administered 2014-01-27: 10 mg via INTRAVENOUS
  Administered 2014-01-27 (×3): 50 mg via INTRAVENOUS

## 2014-01-27 MED ORDER — LACTATED RINGERS IV SOLN: 500.0000 mL | Freq: Once | INTRAVENOUS | Status: AC | PRN

## 2014-01-27 MED ORDER — MORPHINE SULFATE 2 MG/ML IJ SOLN: 1.0000 mg | INTRAMUSCULAR | Status: AC | PRN

## 2014-01-27 MED ORDER — LACTATED RINGERS IV SOLN
INTRAVENOUS | Status: DC | PRN
  Administered 2014-01-27: 06:00:00 via INTRAVENOUS

## 2014-01-27 MED ORDER — ARTIFICIAL TEARS OP OINT
TOPICAL_OINTMENT | OPHTHALMIC | Status: AC
  Filled 2014-01-27: qty 3.5

## 2014-01-27 MED ORDER — PHENYLEPHRINE 40 MCG/ML (10ML) SYRINGE FOR IV PUSH (FOR BLOOD PRESSURE SUPPORT)
PREFILLED_SYRINGE | INTRAVENOUS | Status: AC
  Filled 2014-01-27: qty 10

## 2014-01-27 MED ORDER — SODIUM CHLORIDE 0.9 % IV SOLN: 250.0000 mL | INTRAVENOUS | Status: DC

## 2014-01-27 MED ORDER — ACETAMINOPHEN 650 MG RE SUPP
650.0000 mg | Freq: Once | RECTAL | Status: AC
  Administered 2014-01-27: 650 mg via RECTAL

## 2014-01-27 MED ORDER — PROPOFOL 10 MG/ML IV BOLUS
INTRAVENOUS | Status: AC
  Filled 2014-01-27: qty 20

## 2014-01-27 MED ORDER — 0.9 % SODIUM CHLORIDE (POUR BTL) OPTIME
TOPICAL | Status: DC | PRN
Start: 2014-01-27 — End: 2014-01-27
  Administered 2014-01-27: 6000 mL

## 2014-01-27 MED ORDER — SODIUM CHLORIDE 0.45 % IV SOLN
INTRAVENOUS | Status: DC
  Administered 2014-01-27: 16:00:00 via INTRAVENOUS

## 2014-01-27 MED ORDER — LEVOFLOXACIN IN D5W 750 MG/150ML IV SOLN
750.0000 mg | INTRAVENOUS | Status: AC
  Administered 2014-01-28: 750 mg via INTRAVENOUS
  Filled 2014-01-27: qty 150

## 2014-01-27 MED ORDER — DEXTROSE 5 % IV SOLN
10.0000 mg | INTRAVENOUS | Status: DC | PRN
  Administered 2014-01-27: 10 ug/min via INTRAVENOUS

## 2014-01-27 MED ORDER — ARTIFICIAL TEARS OP OINT
TOPICAL_OINTMENT | OPHTHALMIC | Status: DC | PRN
  Administered 2014-01-27: 1 via OPHTHALMIC

## 2014-01-27 MED ORDER — BISACODYL 5 MG PO TBEC
10.0000 mg | DELAYED_RELEASE_TABLET | Freq: Every day | ORAL | Status: DC
  Administered 2014-01-28 – 2014-01-31 (×4): 10 mg via ORAL
  Filled 2014-01-27 (×4): qty 2

## 2014-01-27 MED ORDER — SODIUM CHLORIDE 0.9 % IV SOLN: INTRAVENOUS | Status: DC

## 2014-01-27 MED ORDER — DOCUSATE SODIUM 100 MG PO CAPS
200.0000 mg | ORAL_CAPSULE | Freq: Every day | ORAL | Status: DC
  Administered 2014-01-28 – 2014-01-31 (×4): 200 mg via ORAL
  Filled 2014-01-27 (×5): qty 2

## 2014-01-27 MED ORDER — MAGNESIUM SULFATE 4 GM/100ML IV SOLN
4.0000 g | Freq: Once | INTRAVENOUS | Status: AC
  Administered 2014-01-27: 4 g via INTRAVENOUS
  Filled 2014-01-27: qty 100

## 2014-01-27 MED ORDER — METOPROLOL TARTRATE 1 MG/ML IV SOLN: 2.5000 mg | INTRAVENOUS | Status: DC | PRN

## 2014-01-27 MED ORDER — SUCCINYLCHOLINE CHLORIDE 20 MG/ML IJ SOLN
INTRAMUSCULAR | Status: AC
  Filled 2014-01-27: qty 1

## 2014-01-27 MED ORDER — LIDOCAINE HCL (CARDIAC) 20 MG/ML IV SOLN
INTRAVENOUS | Status: DC | PRN
  Administered 2014-01-27: 80 mg via INTRAVENOUS

## 2014-01-27 MED ORDER — PHENYLEPHRINE HCL 10 MG/ML IJ SOLN
INTRAMUSCULAR | Status: DC | PRN
  Administered 2014-01-27: 80 ug via INTRAVENOUS

## 2014-01-27 MED ORDER — FAMOTIDINE IN NACL 20-0.9 MG/50ML-% IV SOLN
20.0000 mg | Freq: Two times a day (BID) | INTRAVENOUS | Status: AC
  Administered 2014-01-27: 20 mg via INTRAVENOUS

## 2014-01-27 MED ORDER — BISACODYL 10 MG RE SUPP
10.0000 mg | Freq: Every day | RECTAL | Status: DC
Start: 2014-01-28 — End: 2014-02-01

## 2014-01-27 MED ORDER — METOPROLOL TARTRATE 12.5 MG HALF TABLET
12.5000 mg | ORAL_TABLET | Freq: Two times a day (BID) | ORAL | Status: DC
Start: 2014-01-27 — End: 2014-01-28
  Filled 2014-01-27 (×3): qty 1

## 2014-01-27 MED ORDER — FENTANYL CITRATE 0.05 MG/ML IJ SOLN
INTRAMUSCULAR | Status: DC | PRN
  Administered 2014-01-27: 125 ug via INTRAVENOUS
  Administered 2014-01-27 (×3): 100 ug via INTRAVENOUS
  Administered 2014-01-27 (×3): 150 ug via INTRAVENOUS
  Administered 2014-01-27 (×2): 100 ug via INTRAVENOUS
  Administered 2014-01-27: 25 ug via INTRAVENOUS
  Administered 2014-01-27: 50 ug via INTRAVENOUS
  Administered 2014-01-27: 100 ug via INTRAVENOUS

## 2014-01-27 MED ORDER — ACETAMINOPHEN 160 MG/5ML PO SOLN: 650.0000 mg | Freq: Once | ORAL | Status: AC

## 2014-01-27 MED ORDER — INSULIN REGULAR BOLUS VIA INFUSION
0.0000 [IU] | Freq: Three times a day (TID) | INTRAVENOUS | Status: DC
  Administered 2014-01-28: 3 [IU] via INTRAVENOUS
  Filled 2014-01-27: qty 10

## 2014-01-27 MED ORDER — ACETAMINOPHEN 500 MG PO TABS
1000.0000 mg | ORAL_TABLET | Freq: Four times a day (QID) | ORAL | Status: DC
  Administered 2014-01-28 – 2014-01-29 (×3): 1000 mg via ORAL
  Filled 2014-01-27 (×18): qty 2

## 2014-01-27 MED ORDER — THROMBIN 20000 UNITS EX SOLR
CUTANEOUS | Status: DC | PRN
  Administered 2014-01-27: 20000 [IU] via TOPICAL

## 2014-01-27 MED ORDER — MIDAZOLAM HCL 10 MG/2ML IJ SOLN
INTRAMUSCULAR | Status: AC
  Filled 2014-01-27: qty 2

## 2014-01-27 MED ORDER — MUPIROCIN 2 % EX OINT
TOPICAL_OINTMENT | CUTANEOUS | Status: AC
  Administered 2014-01-27: 1 via NASAL
  Filled 2014-01-27: qty 22

## 2014-01-27 MED ORDER — VANCOMYCIN HCL IN DEXTROSE 1-5 GM/200ML-% IV SOLN
1000.0000 mg | Freq: Once | INTRAVENOUS | Status: AC
  Administered 2014-01-27: 1000 mg via INTRAVENOUS
  Filled 2014-01-27: qty 200

## 2014-01-27 MED ORDER — TRAMADOL HCL 50 MG PO TABS
50.0000 mg | ORAL_TABLET | ORAL | Status: DC | PRN
  Administered 2014-01-28 – 2014-02-01 (×11): 100 mg via ORAL
  Filled 2014-01-27 (×11): qty 2

## 2014-01-27 MED ORDER — ASPIRIN EC 325 MG PO TBEC
325.0000 mg | DELAYED_RELEASE_TABLET | Freq: Every day | ORAL | Status: DC
  Administered 2014-01-28 – 2014-01-31 (×4): 325 mg via ORAL
  Filled 2014-01-27 (×5): qty 1

## 2014-01-27 MED ORDER — ONDANSETRON HCL 4 MG/2ML IJ SOLN
4.0000 mg | Freq: Four times a day (QID) | INTRAMUSCULAR | Status: DC | PRN
Start: 2014-01-27 — End: 2014-02-01

## 2014-01-27 MED ORDER — LACTATED RINGERS IV SOLN
INTRAVENOUS | Status: DC | PRN
  Administered 2014-01-27 (×2): via INTRAVENOUS

## 2014-01-27 MED ORDER — LACTATED RINGERS IV SOLN: INTRAVENOUS | Status: DC

## 2014-01-27 MED ORDER — INSULIN REGULAR HUMAN 100 UNIT/ML IJ SOLN
INTRAMUSCULAR | Status: DC
  Administered 2014-01-27: 2.2 [IU]/h via INTRAVENOUS
  Administered 2014-01-28: 0.8 [IU]/h via INTRAVENOUS
  Filled 2014-01-27 (×2): qty 2.5

## 2014-01-27 MED ORDER — THROMBIN 20000 UNITS EX SOLR
OROMUCOSAL | Status: DC | PRN
  Administered 2014-01-27 (×3): 4 mL via TOPICAL

## 2014-01-27 MED ORDER — MORPHINE SULFATE 2 MG/ML IJ SOLN
2.0000 mg | INTRAMUSCULAR | Status: DC | PRN
  Administered 2014-01-27 – 2014-01-28 (×8): 2 mg via INTRAVENOUS
  Filled 2014-01-27 (×3): qty 1
  Filled 2014-01-27: qty 2
  Filled 2014-01-27 (×5): qty 1

## 2014-01-27 SURGICAL SUPPLY — 103 items
ATTRACTOMAT 16X20 MAGNETIC DRP (DRAPES) ×3 IMPLANT
BAG DECANTER FOR FLEXI CONT (MISCELLANEOUS) ×3 IMPLANT
BANDAGE ELASTIC 4 VELCRO ST LF (GAUZE/BANDAGES/DRESSINGS) ×3 IMPLANT
BANDAGE ELASTIC 6 VELCRO ST LF (GAUZE/BANDAGES/DRESSINGS) ×3 IMPLANT
BASKET HEART (ORDER IN 25'S) (MISCELLANEOUS) ×1
BASKET HEART (ORDER IN 25S) (MISCELLANEOUS) ×2 IMPLANT
BLADE CLIPPER SURG (BLADE) ×3 IMPLANT
BLADE STERNUM SYSTEM 6 (BLADE) ×3 IMPLANT
BNDG GAUZE ELAST 4 BULKY (GAUZE/BANDAGES/DRESSINGS) ×3 IMPLANT
CANISTER SUCTION 2500CC (MISCELLANEOUS) ×3 IMPLANT
CARDIAC SUCTION (MISCELLANEOUS) ×3 IMPLANT
CATH ROBINSON RED A/P 18FR (CATHETERS) ×6 IMPLANT
CATH THORACIC 28FR (CATHETERS) ×3 IMPLANT
CATH THORACIC 36FR (CATHETERS) ×3 IMPLANT
CATH THORACIC 36FR RT ANG (CATHETERS) ×3 IMPLANT
CLIP TI MEDIUM 24 (CLIP) IMPLANT
CLIP TI WIDE RED SMALL 24 (CLIP) ×3 IMPLANT
COUNTER NEEDLE 20 DBL MAG RED (NEEDLE) ×3 IMPLANT
COVER SURGICAL LIGHT HANDLE (MISCELLANEOUS) ×3 IMPLANT
CRADLE DONUT ADULT HEAD (MISCELLANEOUS) ×3 IMPLANT
DERMABOND ADVANCED (GAUZE/BANDAGES/DRESSINGS) ×1
DERMABOND ADVANCED .7 DNX12 (GAUZE/BANDAGES/DRESSINGS) ×2 IMPLANT
DRAPE CARDIOVASCULAR INCISE (DRAPES) ×1
DRAPE SLUSH/WARMER DISC (DRAPES) ×3 IMPLANT
DRAPE SRG 135X102X78XABS (DRAPES) ×2 IMPLANT
DRSG COVADERM 4X14 (GAUZE/BANDAGES/DRESSINGS) ×3 IMPLANT
ELECT CAUTERY BLADE 6.4 (BLADE) ×3 IMPLANT
ELECT REM PT RETURN 9FT ADLT (ELECTROSURGICAL) ×6
ELECTRODE REM PT RTRN 9FT ADLT (ELECTROSURGICAL) ×4 IMPLANT
GAUZE SPONGE 4X4 12PLY STRL (GAUZE/BANDAGES/DRESSINGS) ×6 IMPLANT
GLOVE BIO SURGEON STRL SZ 6 (GLOVE) ×6 IMPLANT
GLOVE BIO SURGEON STRL SZ 6.5 (GLOVE) IMPLANT
GLOVE BIO SURGEON STRL SZ7 (GLOVE) ×6 IMPLANT
GLOVE BIO SURGEON STRL SZ7.5 (GLOVE) ×6 IMPLANT
GLOVE BIO SURGEON STRL SZ8.5 (GLOVE) ×3 IMPLANT
GLOVE BIOGEL PI IND STRL 6 (GLOVE) ×2 IMPLANT
GLOVE BIOGEL PI IND STRL 6.5 (GLOVE) ×4 IMPLANT
GLOVE BIOGEL PI IND STRL 7.0 (GLOVE) ×6 IMPLANT
GLOVE BIOGEL PI INDICATOR 6 (GLOVE) ×1
GLOVE BIOGEL PI INDICATOR 6.5 (GLOVE) ×2
GLOVE BIOGEL PI INDICATOR 7.0 (GLOVE) ×3
GLOVE EUDERMIC 7 POWDERFREE (GLOVE) ×6 IMPLANT
GLOVE ORTHO TXT STRL SZ7.5 (GLOVE) IMPLANT
GOWN STRL REUS W/ TWL LRG LVL3 (GOWN DISPOSABLE) ×12 IMPLANT
GOWN STRL REUS W/ TWL XL LVL3 (GOWN DISPOSABLE) ×2 IMPLANT
GOWN STRL REUS W/TWL LRG LVL3 (GOWN DISPOSABLE) ×6
GOWN STRL REUS W/TWL XL LVL3 (GOWN DISPOSABLE) ×1
HEMOSTAT POWDER SURGIFOAM 1G (HEMOSTASIS) ×9 IMPLANT
HEMOSTAT SURGICEL 2X14 (HEMOSTASIS) ×3 IMPLANT
INSERT FOGARTY 61MM (MISCELLANEOUS) ×3 IMPLANT
INSERT FOGARTY XLG (MISCELLANEOUS) IMPLANT
KIT BASIN OR (CUSTOM PROCEDURE TRAY) ×3 IMPLANT
KIT CATH CPB BARTLE (MISCELLANEOUS) ×3 IMPLANT
KIT ROOM TURNOVER OR (KITS) ×3 IMPLANT
KIT SUCTION CATH 14FR (SUCTIONS) ×3 IMPLANT
KIT VASOVIEW W/TROCAR VH 2000 (KITS) ×3 IMPLANT
NS IRRIG 1000ML POUR BTL (IV SOLUTION) ×18 IMPLANT
PACK OPEN HEART (CUSTOM PROCEDURE TRAY) ×3 IMPLANT
PAD ARMBOARD 7.5X6 YLW CONV (MISCELLANEOUS) ×6 IMPLANT
PAD ELECT DEFIB RADIOL ZOLL (MISCELLANEOUS) ×3 IMPLANT
PENCIL BUTTON HOLSTER BLD 10FT (ELECTRODE) ×3 IMPLANT
PUNCH AORTIC ROTATE 4.0MM (MISCELLANEOUS) IMPLANT
PUNCH AORTIC ROTATE 4.5MM 8IN (MISCELLANEOUS) ×3 IMPLANT
PUNCH AORTIC ROTATE 5MM 8IN (MISCELLANEOUS) IMPLANT
SET CARDIOPLEGIA MPS 5001102 (MISCELLANEOUS) ×3 IMPLANT
SPONGE INTESTINAL PEANUT (DISPOSABLE) IMPLANT
SPONGE LAP 18X18 X RAY DECT (DISPOSABLE) IMPLANT
SPONGE LAP 4X18 X RAY DECT (DISPOSABLE) IMPLANT
SUT BONE WAX W31G (SUTURE) ×3 IMPLANT
SUT MNCRL AB 4-0 PS2 18 (SUTURE) IMPLANT
SUT PROLENE 3 0 SH DA (SUTURE) IMPLANT
SUT PROLENE 3 0 SH1 36 (SUTURE) ×3 IMPLANT
SUT PROLENE 4 0 RB 1 (SUTURE)
SUT PROLENE 4 0 SH DA (SUTURE) IMPLANT
SUT PROLENE 4-0 RB1 .5 CRCL 36 (SUTURE) IMPLANT
SUT PROLENE 5 0 C 1 36 (SUTURE) IMPLANT
SUT PROLENE 6 0 C 1 30 (SUTURE) IMPLANT
SUT PROLENE 7 0 BV 1 (SUTURE) IMPLANT
SUT PROLENE 7 0 BV1 MDA (SUTURE) ×3 IMPLANT
SUT PROLENE 8 0 BV175 6 (SUTURE) IMPLANT
SUT SILK  1 MH (SUTURE)
SUT SILK 1 MH (SUTURE) IMPLANT
SUT STEEL STERNAL CCS#1 18IN (SUTURE) IMPLANT
SUT STEEL SZ 6 DBL 3X14 BALL (SUTURE) IMPLANT
SUT VIC AB 1 CTX 36 (SUTURE) ×2
SUT VIC AB 1 CTX36XBRD ANBCTR (SUTURE) ×4 IMPLANT
SUT VIC AB 2-0 CT1 27 (SUTURE) ×1
SUT VIC AB 2-0 CT1 TAPERPNT 27 (SUTURE) ×2 IMPLANT
SUT VIC AB 2-0 CTX 27 (SUTURE) IMPLANT
SUT VIC AB 3-0 SH 27 (SUTURE)
SUT VIC AB 3-0 SH 27X BRD (SUTURE) IMPLANT
SUT VIC AB 3-0 X1 27 (SUTURE) ×3 IMPLANT
SUT VICRYL 4-0 PS2 18IN ABS (SUTURE) IMPLANT
SUTURE E-PAK OPEN HEART (SUTURE) ×3 IMPLANT
SYSTEM SAHARA CHEST DRAIN ATS (WOUND CARE) ×3 IMPLANT
TAPE CLOTH SURG 4X10 WHT LF (GAUZE/BANDAGES/DRESSINGS) ×3 IMPLANT
TAPE PAPER 2X10 WHT MICROPORE (GAUZE/BANDAGES/DRESSINGS) ×3 IMPLANT
TOWEL OR 17X24 6PK STRL BLUE (TOWEL DISPOSABLE) ×3 IMPLANT
TOWEL OR 17X26 10 PK STRL BLUE (TOWEL DISPOSABLE) ×3 IMPLANT
TRAY FOLEY IC TEMP SENS 16FR (CATHETERS) ×3 IMPLANT
TUBING INSUFFLATION (TUBING) ×3 IMPLANT
UNDERPAD 30X30 INCONTINENT (UNDERPADS AND DIAPERS) ×3 IMPLANT
WATER STERILE IRR 1000ML POUR (IV SOLUTION) ×6 IMPLANT

## 2014-01-27 NOTE — Procedures (Signed)
Extubation Procedure Note  Patient Details:   Name: Derek [name] DOB: Oct 15, 1950 MRN: [id number]   Airway Documentation:     Evaluation  O2 sats: stable throughout Complications: No apparent complications Patient did tolerate procedure well. Bilateral Breath Sounds: Clear Suctioning: Oral, Airway Yes, pt able to speak, no stridor noted.  NO distress noted, sat 98% on 4 lpm [place].  IS 750 ml.  Lenna Sciara 01/27/2014, 4:54 PM

## 2014-01-27 NOTE — Progress Notes (Signed)
Echocardiogram Echocardiogram Transesophageal has been performed.  Viyan Rosamond 01/27/2014, 8:21 AM

## 2014-01-27 NOTE — Plan of Care (Signed)
Problem: Phase II - Intermediate Post-Op Goal: Wean to Extubate Outcome: Completed/Met Date Met:  01/27/14

## 2014-01-27 NOTE — OR Nursing (Signed)
First call to SICU at 1156.

## 2014-01-27 NOTE — Anesthesia Procedure Notes (Addendum)
Procedure Name: Intubation Date/Time: 01/27/2014 7:59 AM Performed by: Garner Nash Pre-anesthesia Checklist: Patient identified, Emergency Drugs available, Suction available, Patient being monitored and Timeout performed Patient Re-evaluated:Patient Re-evaluated prior to inductionOxygen Delivery Method: Circle system utilized Preoxygenation: Pre-oxygenation with 100% oxygen Intubation Type: IV induction Ventilation: Mask ventilation without difficulty Laryngoscope Size: Mac and 4 Grade View: Grade III Tube type: Oral Tube size: 8.0 mm Number of attempts: 1 Airway Equipment and Method: Stylet Placement Confirmation: ETT inserted through vocal cords under direct vision,  positive ETCO2,  CO2 detector and breath sounds checked- equal and bilateral Secured at: 22 cm Tube secured with: Tape Dental Injury: Teeth and Oropharynx as per pre-operative assessment

## 2014-01-27 NOTE — Anesthesia Preprocedure Evaluation (Addendum)
Anesthesia Evaluation  Patient identified by MRN, date of birth, ID band Patient awake    Reviewed: Allergy & Precautions, H&P , NPO status   Airway Mallampati: III  TM Distance: >3 FB Neck ROM: Full    Dental  (+) Teeth Intact, Chipped,    Pulmonary neg pulmonary ROS,  breath sounds clear to auscultation        Cardiovascular hypertension, + Past MI and +CHF Rhythm:Regular Rate:Normal     Neuro/Psych negative neurological ROS     GI/Hepatic negative GI ROS, Neg liver ROS,   Endo/Other  negative endocrine ROS  Renal/GU negative Renal ROS     Musculoskeletal negative musculoskeletal ROS (+)   Abdominal   Peds  Hematology negative hematology ROS (+)   Anesthesia Other Findings   Reproductive/Obstetrics                           Anesthesia Physical Anesthesia Plan  ASA: III  Anesthesia Plan: General   Post-op Pain Management:    Induction:   Airway Management Planned: Oral ETT  Additional Equipment: Arterial line, PA Cath and TEE  Intra-op Plan:   Post-operative Plan: Extubation in OR  Informed Consent: I have reviewed the patients History and Physical, chart, labs and discussed the procedure including the risks, benefits and alternatives for the proposed anesthesia with the patient or authorized representative who has indicated his/her understanding and acceptance.   Dental advisory given  Plan Discussed with: Surgeon and CRNA  Anesthesia Plan Comments:         Anesthesia Quick Evaluation

## 2014-01-27 NOTE — Plan of Care (Signed)
Problem: Phase II - Intermediate Post-Op Goal: Pain controlled with appropriate interventions Outcome: Completed/Met Date Met:  01/27/14

## 2014-01-27 NOTE — Progress Notes (Signed)
Cardiothoracic Surgery  He had a stable day yesterday without pain or shortness of breath. Ready for OR this am. He has no further questions.

## 2014-01-27 NOTE — Progress Notes (Signed)
S/p CABG x 4  Waking up. Doing weaning parameters now  BP 140/83 mmHg  Pulse 88  Temp(Src) 100.2 F (37.9 C) (Oral)  Resp 23  Ht 5\' 8"  (1.727 m)  Wt 202 lb 9.6 oz (91.9 kg)  BMI 30.81 kg/m2  SpO2 99%   Intake/Output Summary (Last 24 hours) at 01/27/14 1648 Last data filed at 01/27/14 1600  Gross per 24 hour  Intake 4237.73 ml  Output   3390 ml  Net 847.73 ml   Minimal chest tube output  Looks good to extubate

## 2014-01-27 NOTE — Progress Notes (Signed)
     Pt is doing well following CABG Hemodynamics appear to be stable.  Rhythm is stable.   Thayer Headings, Brooke Bonito., MD, [facility] 01/27/2014, 2:29 PM [address],  [place] Pager [phone]

## 2014-01-27 NOTE — Progress Notes (Signed)
Patient transported via stretcher with nurse to OR holding.  Report given to nurse anesthetist. VSS. No complaints of pain voiced.

## 2014-01-27 NOTE — Transfer of Care (Signed)
Immediate Anesthesia Transfer of Care Note  Patient: Derek [name]  Procedure(s) Performed: Procedure(s) with comments: CORONARY ARTERY BYPASS GRAFTING (CABG), ON PUMP, TIMES FOUR, USING LEFT INTERNAL MAMMARY ARTERY, RIGHT GREATER SAPHENOUS VEIN HARVESTED ENDOSCOPICALLY (N/A) - LIMA-LAD; SVG-OM; SVG-PD; SVG-DIAG TRANSESOPHAGEAL ECHOCARDIOGRAM (TEE) (N/A)  Patient Location: SICU  Anesthesia Type:General  Level of Consciousness: sedated and Patient remains intubated per anesthesia plan  Airway & Oxygen Therapy: Patient remains intubated per anesthesia plan  Post-op Assessment: Report given to PACU RN and Post -op Vital signs reviewed and stable  Post vital signs: Reviewed and stable  Complications: No apparent anesthesia complications   Spo2 409, BP 128/70, HR paced at 84

## 2014-01-27 NOTE — OR Nursing (Signed)
2nd call to SICU 1228.

## 2014-01-27 NOTE — Brief Op Note (Signed)
      [address]       [place] [address]             [phone]     01/21/2014 - 01/27/2014  11:14 AM  PATIENT:  Derek [name]  63 y.o. male  PRE-OPERATIVE DIAGNOSIS:  CAD  POST-OPERATIVE DIAGNOSIS:  CORONARY ARTERY DISEASE  PROCEDURE:  Procedure(s): CORONARY ARTERY BYPASS GRAFTING (CABG)X4 LIMA-LAD; SVG-OM; SVG-PD; SVG-DIAG TRANSESOPHAGEAL ECHOCARDIOGRAM (TEE) EVH RIGHT LEG  SURGEON:  Surgeon(s): Gaye Pollack, MD  PHYSICIAN ASSISTANT: WAYNE GOLD PA-C  ANESTHESIA:   general  PATIENT CONDITION:  ICU - intubated and hemodynamically stable.  PRE-OPERATIVE WEIGHT: 11BZ  COMPLICATIONS: NO KNOWN

## 2014-01-27 NOTE — Op Note (Signed)
CARDIOVASCULAR SURGERY OPERATIVE NOTE  01/27/2014  Surgeon:  Gaye Pollack, MD  First Assistant: Jadene Pierini,  PA-C   Preoperative Diagnosis:  Severe multi-vessel coronary artery disease   Postoperative Diagnosis:  Same   Procedure:  1. Median Sternotomy 2. Extracorporeal circulation 3.   Coronary artery bypass grafting x 4   Left internal mammary graft to the LAD  SVG to diagonal  SVG to OM  SVG to PDA 4.   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 BNP was elevated at 1971. He was started on Coreg, Enalapril, and lasix and he noted marked improvement in his cough. He was referred to Dr. [name]. He had LVH on an ECG and an echo was done that showed an EF of 40-45% with severe hypkinesis of the apical anteroseptal, inferior and inferoseptal myocardium, grade 2 diastolic dysfunction. He then had an episode of gout in his foot and was seen again at [facility] and was given prednisone. After the first dose he was restless with cough and felt bad so he went back to [facility] where he was noted to be markedly hypertensive with mildly elevated cardiac enzymes. He was transferred to [facility] and cath yesterday showed severe 3 vessel CAD with a diffusely diseased LAD with 90% proximal stenosis. The LCX has 95% proximal stenosis extending back to the ostium. The RCA is occluded with left to right collaterals. The EF is 35%.   He has severe multi-vessel coronary disease with moderate LV dysfunction presenting with congestive heart failure symptoms. I agree that CABG is the best treatment for this patient to prevent further loss of  myocardium and recurrent congestive heart failure. I discussed the operative procedure with the patient and his wife including alternatives, benefits and risks; including but not limited to bleeding, blood transfusion, infection, stroke, myocardial infarction, graft failure, heart block requiring a permanent pacemaker, organ dysfunction, and death. Vinnie Raju understands and agrees to proceed.   Preparation:  The patient was seen in the preoperative holding area and the correct patient, correct operation were confirmed with the patient after reviewing the medical record and catheterization. The consent was signed by me. Preoperative antibiotics were given. A pulmonary arterial line and radial arterial line were placed by the anesthesia team. The patient was taken back to the operating room and positioned supine on the operating room table. After being placed under general endotracheal anesthesia by the anesthesia team a foley catheter was placed. The neck, chest, abdomen, and both legs were prepped with betadine soap and solution and draped in the usual sterile manner. A surgical time-out was taken and the correct patient and operative procedure were confirmed with the nursing and anesthesia staff.   Cardiopulmonary Bypass:  A median sternotomy was performed. The pericardium was opened in the midline. Right ventricular function appeared normal. The ascending aorta was of normal size and had no palpable plaque. There were no contraindications to aortic cannulation or cross-clamping. The patient was fully systemically heparinized and the ACT was maintained > 400 sec. The proximal aortic arch was cannulated with a 20 F aortic cannula for arterial inflow. Venous cannulation was performed via the right atrial appendage using a two-staged venous cannula. An antegrade cardioplegia/vent cannula  was inserted into the mid-ascending aorta. Aortic occlusion was performed with a single cross-clamp. Systemic cooling to  32 degrees Centigrade and topical cooling of the heart with iced saline were used. Hyperkalemic antegrade cold blood cardioplegia was used to induce diastolic arrest and was then given at about 20 minute intervals throughout the period of arrest to maintain myocardial temperature at or below 10 degrees centigrade. A temperature probe was inserted into the interventricular septum and an insulating pad was placed in the pericardium.   Left internal mammary harvest:  The left side of the sternum was retracted using the Rultract retractor. The left internal mammary artery was harvested as a pedicle graft. All side branches were clipped. It was a medium-sized vessel of good quality with excellent blood flow. It was ligated distally and divided. It was sprayed with topical papaverine solution to prevent vasospasm.   Endoscopic vein harvest:  The right greater saphenous vein was harvested endoscopically through a 2 cm incision medial to the right knee. It was harvested from the upper thigh to below the knee. It was a medium-sized vein of good quality. The side branches were all ligated with 4-0 silk ties.    Coronary arteries:  The coronary arteries were examined.   LAD:  Diffusely diseased. Diagonal is a moderate sized vessel  LCX:  Large OM with no distal disease in it.  RCA:  Diffusely diseased. The PDA is a long, moderate caliber vessel. PL is small.   Grafts:  1. LIMA to the LAD: 1.75 mm. It was sewn end to side using 8-0 prolene continuous suture. 2. SVG to diagonal: 1.6 mm. It was sewn end to side using 7-0 prolene continuous suture. 3. SVG to OM:  2.5 mm. It was sewn end to side using 7-0 prolene continuous suture. 4. SVG to PDA: 1.6 mm. It was sewn end to side using 7-0 prolene continuous suture.  The proximal vein graft anastomoses were performed to the mid-ascending aorta using continuous 6-0 prolene suture. Graft markers were placed around the proximal  anastomoses.   Completion:  The patient was rewarmed to 37 degrees Centigrade. The clamp was removed from the LIMA pedicle and there was rapid warming of the septum and return of ventricular fibrillation. The crossclamp was removed with a time of 94  minutes. There was spontaneous return of sinus rhythm. The distal and proximal anastomoses were checked for hemostasis. The position of the grafts was satisfactory. Two temporary epicardial pacing wires were placed on the right atrium and two on the right ventricle. The patient was weaned from CPB without difficulty on no inotropes. CPB time was 113 minutes. Cardiac output was 6 LPM. Heparin was fully reversed with protamine and the aortic and venous cannulas removed. Hemostasis was achieved. Mediastinal and left pleural drainage tubes were placed. The sternum was closed with double #6 stainless steel wires. The fascia was closed with continuous # 1 vicryl suture. The subcutaneous tissue was closed with 2-0 vicryl continuous suture. The skin was closed with 3-0 vicryl subcuticular suture. All sponge, needle, and instrument counts were reported correct at the end of the case. Dry sterile dressings were placed over the incisions and around the chest tubes which were connected to pleurevac suction. The patient was then transported to the surgical intensive care unit in critical but stable condition.

## 2014-01-27 NOTE — Anesthesia Postprocedure Evaluation (Signed)
  Anesthesia Post-op Note  Patient: Leisure centre manager  Procedure(s) Performed: Procedure(s) with comments: CORONARY ARTERY BYPASS GRAFTING (CABG), ON PUMP, TIMES FOUR, USING LEFT INTERNAL MAMMARY ARTERY, RIGHT GREATER SAPHENOUS VEIN HARVESTED ENDOSCOPICALLY (N/A) - LIMA-LAD; SVG-OM; SVG-PD; SVG-DIAG TRANSESOPHAGEAL ECHOCARDIOGRAM (TEE) (N/A)  Patient Location: SICU  Anesthesia Type:General  Level of Consciousness: oriented and Patient remains intubated per anesthesia plan  Airway and Oxygen Therapy: Patient remains intubated per anesthesia plan and Patient placed on Ventilator (see vital sign flow sheet for setting)  Post-op Pain: none  Post-op Assessment: Post-op Vital signs reviewed, Patient's Cardiovascular Status Stable and Respiratory Function Stable  Post-op Vital Signs: stable  Last Vitals:  Filed Vitals:   01/27/14 1515  BP:   Pulse: 84  Temp: 37.4 C  Resp: 18    Complications: No apparent anesthesia complications

## 2014-01-28 ENCOUNTER — Inpatient Hospital Stay (HOSPITAL_COMMUNITY): Payer: Medicare Other

## 2014-01-28 LAB — POCT I-STAT, CHEM 8
BUN: 20 mg/dL (ref 6–23)
CHLORIDE: 101 meq/L (ref 96–112)
CREATININE: 1.1 mg/dL (ref 0.50–1.35)
Calcium, Ion: 1.29 mmol/L (ref 1.13–1.30)
GLUCOSE: 117 mg/dL — AB (ref 70–99)
HCT: 34 % — ABNORMAL LOW (ref 39.0–52.0)
HEMOGLOBIN: 11.6 g/dL — AB (ref 13.0–17.0)
POTASSIUM: 4 meq/L (ref 3.7–5.3)
Sodium: 137 mEq/L (ref 137–147)
TCO2: 23 mmol/L (ref 0–100)

## 2014-01-28 LAB — BASIC METABOLIC PANEL
Anion gap: 14 (ref 5–15)
BUN: 16 mg/dL (ref 6–23)
CHLORIDE: 104 meq/L (ref 96–112)
CO2: 21 mEq/L (ref 19–32)
Calcium: 8.8 mg/dL (ref 8.4–10.5)
Creatinine, Ser: 0.81 mg/dL (ref 0.50–1.35)
Glucose, Bld: 102 mg/dL — ABNORMAL HIGH (ref 70–99)
POTASSIUM: 4.4 meq/L (ref 3.7–5.3)
Sodium: 139 mEq/L (ref 137–147)

## 2014-01-28 LAB — GLUCOSE, CAPILLARY
GLUCOSE-CAPILLARY: 114 mg/dL — AB (ref 70–99)
GLUCOSE-CAPILLARY: 102 mg/dL — AB (ref 70–99)
GLUCOSE-CAPILLARY: 92 mg/dL (ref 70–99)
GLUCOSE-CAPILLARY: 106 mg/dL — AB (ref 70–99)
GLUCOSE-CAPILLARY: 101 mg/dL — AB (ref 70–99)
GLUCOSE-CAPILLARY: 101 mg/dL — AB (ref 70–99)
GLUCOSE-CAPILLARY: 110 mg/dL — AB (ref 70–99)
Glucose-Capillary: 104 mg/dL — ABNORMAL HIGH (ref 70–99)
Glucose-Capillary: 104 mg/dL — ABNORMAL HIGH (ref 70–99)
Glucose-Capillary: 108 mg/dL — ABNORMAL HIGH (ref 70–99)
Glucose-Capillary: 109 mg/dL — ABNORMAL HIGH (ref 70–99)
Glucose-Capillary: 114 mg/dL — ABNORMAL HIGH (ref 70–99)
Glucose-Capillary: 120 mg/dL — ABNORMAL HIGH (ref 70–99)
Glucose-Capillary: 125 mg/dL — ABNORMAL HIGH (ref 70–99)
Glucose-Capillary: 143 mg/dL — ABNORMAL HIGH (ref 70–99)
Glucose-Capillary: 99 mg/dL (ref 70–99)

## 2014-01-28 LAB — MAGNESIUM
MAGNESIUM: 2.2 mg/dL (ref 1.5–2.5)
MAGNESIUM: 2 mg/dL (ref 1.5–2.5)

## 2014-01-28 LAB — CBC
HEMATOCRIT: 33.2 % — AB (ref 39.0–52.0)
HEMATOCRIT: 32.6 % — AB (ref 39.0–52.0)
HEMOGLOBIN: 10.4 g/dL — AB (ref 13.0–17.0)
Hemoglobin: 11.1 g/dL — ABNORMAL LOW (ref 13.0–17.0)
MCH: 26.9 pg (ref 26.0–34.0)
MCH: 29.7 pg (ref 26.0–34.0)
MCHC: 31.3 g/dL (ref 30.0–36.0)
MCHC: 34 g/dL (ref 30.0–36.0)
MCV: 85.8 fL (ref 78.0–100.0)
MCV: 87.2 fL (ref 78.0–100.0)
Platelets: 210 10*3/uL (ref 150–400)
Platelets: 210 10*3/uL (ref 150–400)
RBC: 3.87 MIL/uL — ABNORMAL LOW (ref 4.22–5.81)
RBC: 3.74 MIL/uL — ABNORMAL LOW (ref 4.22–5.81)
RDW: 14.7 % (ref 11.5–15.5)
RDW: 12.3 % (ref 11.5–15.5)
WBC: 9.1 10*3/uL (ref 4.0–10.5)
WBC: 12.2 10*3/uL — AB (ref 4.0–10.5)

## 2014-01-28 LAB — CREATININE, SERUM
Creatinine, Ser: 0.98 mg/dL (ref 0.50–1.35)
GFR calc Af Amer: >90 mL/min (ref >90)
GFR, EST NON AFRICAN AMERICAN: 84 mL/min — AB (ref >90)

## 2014-01-28 MED ORDER — FUROSEMIDE 10 MG/ML IJ SOLN
20.0000 mg | Freq: Two times a day (BID) | INTRAMUSCULAR | Status: AC
  Administered 2014-01-28 (×2): 20 mg via INTRAVENOUS
  Filled 2014-01-28: qty 2

## 2014-01-28 MED ORDER — CARVEDILOL 6.25 MG PO TABS
6.2500 mg | ORAL_TABLET | Freq: Two times a day (BID) | ORAL | Status: DC
  Filled 2014-01-28 (×3): qty 1

## 2014-01-28 MED ORDER — ENOXAPARIN SODIUM 40 MG/0.4ML ~~LOC~~ SOLN
40.0000 mg | Freq: Every day | SUBCUTANEOUS | Status: DC
  Administered 2014-01-28 – 2014-01-31 (×4): 40 mg via SUBCUTANEOUS
  Filled 2014-01-28 (×5): qty 0.4

## 2014-01-28 MED ORDER — L-TRYPTOPHAN 500 MG PO CAPS: 1000.0000 mg | ORAL_CAPSULE | Freq: Every day | ORAL | Status: DC

## 2014-01-28 MED ORDER — CHLORHEXIDINE GLUCONATE 0.12 % MT SOLN
15.0000 mL | Freq: Two times a day (BID) | OROMUCOSAL | Status: DC
  Administered 2014-01-28: 15 mL via OROMUCOSAL
  Filled 2014-01-28: qty 15

## 2014-01-28 MED ORDER — CARVEDILOL 3.125 MG PO TABS
3.1250 mg | ORAL_TABLET | Freq: Two times a day (BID) | ORAL | Status: DC
  Administered 2014-01-28 – 2014-02-01 (×8): 3.125 mg via ORAL
  Filled 2014-01-28 (×10): qty 1

## 2014-01-28 MED ORDER — INSULIN DETEMIR 100 UNIT/ML ~~LOC~~ SOLN
15.0000 [IU] | Freq: Once | SUBCUTANEOUS | Status: AC
  Administered 2014-01-28: 15 [IU] via SUBCUTANEOUS
  Filled 2014-01-28: qty 0.15

## 2014-01-28 MED ORDER — INSULIN ASPART 100 UNIT/ML ~~LOC~~ SOLN
0.0000 [IU] | SUBCUTANEOUS | Status: DC
  Administered 2014-01-28 – 2014-01-29 (×3): 2 [IU] via SUBCUTANEOUS

## 2014-01-28 MED ORDER — CETYLPYRIDINIUM CHLORIDE 0.05 % MT LIQD
7.0000 mL | Freq: Two times a day (BID) | OROMUCOSAL | Status: DC
  Administered 2014-01-28 (×2): 7 mL via OROMUCOSAL

## 2014-01-28 MED ORDER — MELATONIN 5 MG PO TABS: 5.0000 mg | ORAL_TABLET | Freq: Every day | ORAL | Status: DC

## 2014-01-28 MED ORDER — INSULIN DETEMIR 100 UNIT/ML ~~LOC~~ SOLN
15.0000 [IU] | Freq: Every day | SUBCUTANEOUS | Status: DC
  Filled 2014-01-28: qty 0.15

## 2014-01-28 NOTE — Progress Notes (Signed)
1 Day Post-Op Procedure(s) (LRB): CORONARY ARTERY BYPASS GRAFTING (CABG), ON PUMP, TIMES FOUR, USING LEFT INTERNAL MAMMARY ARTERY, RIGHT GREATER SAPHENOUS VEIN HARVESTED ENDOSCOPICALLY (N/A) TRANSESOPHAGEAL ECHOCARDIOGRAM (TEE) (N/A) Subjective: Some incisional pain Denies nausea, but not much of an appetite  Objective: Vital signs in last 24 hours: Temp:  [98.4 F (36.9 C)-100.2 F (37.9 C)] 99.1 F (37.3 C) (11/28 0804) Pulse Rate:  [81-93] 82 (11/28 0804) Cardiac Rhythm:  [-] Normal sinus rhythm (11/28 0800) Resp:  [12-29] 25 (11/28 0804) BP: (102-140)/(53-83) 114/62 mmHg (11/28 0800) SpO2:  [96 %-100 %] 97 % (11/28 0804) Arterial Line BP: (87-145)/(53-89) 109/54 mmHg (11/28 0804) FiO2 (%):  [40 %-50 %] 40 % (11/27 1615) Weight:  [213 lb 13.5 oz (97 kg)] 213 lb 13.5 oz (97 kg) (11/28 0545)  Hemodynamic parameters for last 24 hours: PAP: (25-50)/(8-29) 45/15 mmHg CO:  [4.1 L/min-5.9 L/min] 5.2 L/min CI:  [2 L/min/m2-2.9 L/min/m2] 2.5 L/min/m2  Intake/Output from previous day: 11/27 0701 - 11/28 0700 In: 4574.9 [I.V.:3000.9; Blood:374; IV Piggyback:1200] Out: 2735 [Urine:2325; Chest Tube:410] Intake/Output this shift: Total I/O In: 60.8 [I.V.:60.8] Out: 25 [Urine:25]  General appearance: alert and no distress Neurologic: intact Heart: regular rate and rhythm Lungs: diminished breath sounds bibasilar Abdomen: normal findings: soft, non-tender  Lab Results:  Recent Labs  01/27/14 1850 01/27/14 1852 01/28/14 0400  WBC 9.9  --  9.1  HGB 10.9* 11.2* 10.4*  HCT 34.3* 33.0* 33.2*  PLT 193  --  210   BMET:  Recent Labs  01/27/14 0230  01/27/14 1852 01/28/14 0400  NA 141  < > 140 139  K 3.7  < > 4.4 4.4  CL 102  < > 105 104  CO2 26  --   --  21  GLUCOSE 110*  < > 135* 102*  BUN 17  < > 14 16  CREATININE 0.90  < > 0.90 0.81  CALCIUM 9.3  --   --  8.8  < > = values in this interval not displayed.  PT/INR:  Recent Labs  01/27/14 1310  LABPROT 16.0*  INR  1.26   ABG    Component Value Date/Time   PHART 7.400 01/27/2014 1748   HCO3 22.8 01/27/2014 1748   TCO2 20 01/27/2014 1852   ACIDBASEDEF 2.0 01/27/2014 1748   O2SAT 97.0 01/27/2014 1748   CBG (last 3)   Recent Labs  01/27/14 2156 01/27/14 2301 01/27/14 2355  GLUCAP 114* 102* 114*    Assessment/Plan: S/P Procedure(s) (LRB): CORONARY ARTERY BYPASS GRAFTING (CABG), ON PUMP, TIMES FOUR, USING LEFT INTERNAL MAMMARY ARTERY, RIGHT GREATER SAPHENOUS VEIN HARVESTED ENDOSCOPICALLY (N/A) TRANSESOPHAGEAL ECHOCARDIOGRAM (TEE) (N/A) POD # 1  CV- stable, SR, good index  Dc swan  Coreg, ASA, lipitor  RESP- IS  RENAL- lytes and creatinine OK, diurese  ENDO- CBG well controlled on drip- transition to insulin + SSI  Anemia secondary to ABL- mild, follow  SCD + enoxaparin for DVT prophylaxis  DC CT  OOB, ambulate    LOS: 7 days    Fraya Ueda C 01/28/2014

## 2014-01-28 NOTE — Progress Notes (Signed)
PHARMACIST - PHYSICIAN ORDER COMMUNICATION  CONCERNING: P&T Medication Policy on Herbal Medications  DESCRIPTION:  This patient's order for:  L-Tryptophan, melatonin  has been noted.  This product(s) is classified as an "herbal" or natural product. Due to a lack of definitive safety studies or FDA approval, nonstandard manufacturing practices, plus the potential risk of unknown drug-drug interactions while on inpatient medications, the Pharmacy and Therapeutics Committee does not permit the use of "herbal" or natural products of this type within [facility].   ACTION TAKEN: The pharmacy department is unable to verify this order at this time and your patient has been informed of this safety policy. Please reevaluate patient's clinical condition at discharge and address if the herbal or natural product(s) should be resumed at that time.  Elicia Lamp, PharmD Clinical Pharmacist - Resident Pager [phone] 01/28/2014 8:50 AM

## 2014-01-28 NOTE — Plan of Care (Signed)
Problem: Phase II - Intermediate Post-Op Goal: Maintain Hemodynamic Stability Outcome: Completed/Met Date Met:  01/28/14 Goal: CBGs/Blood Glucose per SCIP Criteria Outcome: Completed/Met Date Met:  01/28/14 Goal: Advance Diet Outcome: Completed/Met Date Met:  01/28/14 Goal: Activity Progressed Outcome: Completed/Met Date Met:  01/28/14

## 2014-01-28 NOTE — Progress Notes (Signed)
Up in chair  BP 144/84 mmHg  Pulse 85  Temp(Src) 99.6 F (37.6 C) (Oral)  Resp 30  Ht 5\' 8"  (1.727 m)  Wt 213 lb 13.5 oz (97 kg)  BMI 32.52 kg/m2  SpO2 99%   Intake/Output Summary (Last 24 hours) at 01/28/14 1938 Last data filed at 01/28/14 1900  Gross per 24 hour  Intake 2479.25 ml  Output   1545 ml  Net 934.25 ml    K= 4.0 Hct 34  Doing well POD # 1

## 2014-01-29 ENCOUNTER — Inpatient Hospital Stay (HOSPITAL_COMMUNITY): Payer: Medicare Other

## 2014-01-29 LAB — BASIC METABOLIC PANEL
Anion gap: 12 (ref 5–15)
BUN: 19 mg/dL (ref 6–23)
CO2: 25 meq/L (ref 19–32)
CREATININE: 0.9 mg/dL (ref 0.50–1.35)
Calcium: 8.9 mg/dL (ref 8.4–10.5)
Chloride: 98 mEq/L (ref 96–112)
GFR calc Af Amer: >90 mL/min (ref >90)
GFR calc non Af Amer: 87 mL/min — ABNORMAL LOW (ref >90)
GLUCOSE: 118 mg/dL — AB (ref 70–99)
POTASSIUM: 4.4 meq/L (ref 3.7–5.3)
Sodium: 135 mEq/L — ABNORMAL LOW (ref 137–147)

## 2014-01-29 LAB — CBC
HEMATOCRIT: 32.2 % — AB (ref 39.0–52.0)
Hemoglobin: 10.1 g/dL — ABNORMAL LOW (ref 13.0–17.0)
MCH: 26.5 pg (ref 26.0–34.0)
MCHC: 31.4 g/dL (ref 30.0–36.0)
MCV: 84.5 fL (ref 78.0–100.0)
Platelets: 199 10*3/uL (ref 150–400)
RBC: 3.81 MIL/uL — ABNORMAL LOW (ref 4.22–5.81)
RDW: 14.9 % (ref 11.5–15.5)
WBC: 9.3 10*3/uL (ref 4.0–10.5)

## 2014-01-29 LAB — GLUCOSE, CAPILLARY
GLUCOSE-CAPILLARY: 115 mg/dL — AB (ref 70–99)
Glucose-Capillary: 107 mg/dL — ABNORMAL HIGH (ref 70–99)
Glucose-Capillary: 113 mg/dL — ABNORMAL HIGH (ref 70–99)
Glucose-Capillary: 140 mg/dL — ABNORMAL HIGH (ref 70–99)

## 2014-01-29 MED ORDER — FUROSEMIDE 40 MG PO TABS
40.0000 mg | ORAL_TABLET | Freq: Every day | ORAL | Status: DC
  Administered 2014-01-29 – 2014-01-31 (×3): 40 mg via ORAL
  Filled 2014-01-29 (×4): qty 1

## 2014-01-29 MED ORDER — ALUM & MAG HYDROXIDE-SIMETH 200-200-20 MG/5ML PO SUSP: 15.0000 mL | ORAL | Status: DC | PRN

## 2014-01-29 MED ORDER — INSULIN ASPART 100 UNIT/ML ~~LOC~~ SOLN: 0.0000 [IU] | Freq: Three times a day (TID) | SUBCUTANEOUS | Status: DC

## 2014-01-29 MED ORDER — L-TRYPTOPHAN 500 MG PO CAPS: 1000.0000 mg | ORAL_CAPSULE | Freq: Every day | ORAL | Status: DC

## 2014-01-29 MED ORDER — SODIUM CHLORIDE 0.9 % IJ SOLN: 3.0000 mL | INTRAMUSCULAR | Status: DC | PRN

## 2014-01-29 MED ORDER — SODIUM CHLORIDE 0.9 % IV SOLN: 250.0000 mL | INTRAVENOUS | Status: DC | PRN

## 2014-01-29 MED ORDER — ZOLPIDEM TARTRATE 5 MG PO TABS: 5.0000 mg | ORAL_TABLET | Freq: Every evening | ORAL | Status: DC | PRN

## 2014-01-29 MED ORDER — MOVING RIGHT ALONG BOOK
Freq: Once | Status: AC
  Administered 2014-01-29: 20:00:00
  Filled 2014-01-29: qty 1

## 2014-01-29 MED ORDER — SODIUM CHLORIDE 0.9 % IJ SOLN
3.0000 mL | Freq: Two times a day (BID) | INTRAMUSCULAR | Status: DC
  Administered 2014-01-29 – 2014-01-31 (×5): 3 mL via INTRAVENOUS

## 2014-01-29 MED ORDER — MAGNESIUM HYDROXIDE 400 MG/5ML PO SUSP: 30.0000 mL | Freq: Every day | ORAL | Status: DC | PRN

## 2014-01-29 MED ORDER — POTASSIUM CHLORIDE CRYS ER 20 MEQ PO TBCR
20.0000 meq | EXTENDED_RELEASE_TABLET | Freq: Two times a day (BID) | ORAL | Status: DC
  Administered 2014-01-29 – 2014-01-31 (×6): 20 meq via ORAL
  Filled 2014-01-29 (×10): qty 1

## 2014-01-29 MED ORDER — MELATONIN 5 MG PO TABS: 5.0000 mg | ORAL_TABLET | Freq: Every day | ORAL | Status: DC

## 2014-01-29 NOTE — Progress Notes (Signed)
2 Days Post-Op Procedure(s) (LRB): CORONARY ARTERY BYPASS GRAFTING (CABG), ON PUMP, TIMES FOUR, USING LEFT INTERNAL MAMMARY ARTERY, RIGHT GREATER SAPHENOUS VEIN HARVESTED ENDOSCOPICALLY (N/A) TRANSESOPHAGEAL ECHOCARDIOGRAM (TEE) (N/A) Subjective: Feels well Pain controlled with tramadol Denies nausea  Objective: Vital signs in last 24 hours: Temp:  [98.7 F (37.1 C)-100.5 F (38.1 C)] 100.5 F (38.1 C) (11/29 0800) Pulse Rate:  [76-100] 85 (11/29 0700) Cardiac Rhythm:  [-] Normal sinus rhythm (11/29 0800) Resp:  [19-30] 24 (11/29 0700) BP: (92-145)/(59-94) 134/70 mmHg (11/29 0800) SpO2:  [90 %-100 %] 95 % (11/29 0700) Weight:  [212 lb 11.9 oz (96.5 kg)] 212 lb 11.9 oz (96.5 kg) (11/29 0600)  Hemodynamic parameters for last 24 hours:    Intake/Output from previous day: 11/28 0701 - 11/29 0700 In: 2184.2 [P.O.:1520; I.V.:664.2] Out: 1515 [Urine:1465; Chest Tube:50] Intake/Output this shift: Total I/O In: 20 [I.V.:20] Out: -   General appearance: alert and no distress Neurologic: intact Heart: regular rate and rhythm Lungs: diminished breath sounds bibasilar Abdomen: normal findings: soft, non-tender  Lab Results:  Recent Labs  01/28/14 1335 01/28/14 1549 01/29/14 0544  WBC 12.2*  --  9.3  HGB 11.1* 11.6* 10.1*  HCT 32.6* 34.0* 32.2*  PLT 210  --  199   BMET:  Recent Labs  01/28/14 0400  01/28/14 1549 01/29/14 0544  NA 139  --  137 135*  K 4.4  --  4.0 4.4  CL 104  --  101 98  CO2 21  --   --  25  GLUCOSE 102*  --  117* 118*  BUN 16  --  20 19  CREATININE 0.81  < > 1.10 0.90  CALCIUM 8.8  --   --  8.9  < > = values in this interval not displayed.  PT/INR:  Recent Labs  01/27/14 1310  LABPROT 16.0*  INR 1.26   ABG    Component Value Date/Time   PHART 7.400 01/27/2014 1748   HCO3 22.8 01/27/2014 1748   TCO2 23 01/28/2014 1549   ACIDBASEDEF 2.0 01/27/2014 1748   O2SAT 97.0 01/27/2014 1748   CBG (last 3)   Recent Labs  01/28/14 1936  01/28/14 2342 01/29/14 0826  GLUCAP 125* 107* 140*    Assessment/Plan: S/P Procedure(s) (LRB): CORONARY ARTERY BYPASS GRAFTING (CABG), ON PUMP, TIMES FOUR, USING LEFT INTERNAL MAMMARY ARTERY, RIGHT GREATER SAPHENOUS VEIN HARVESTED ENDOSCOPICALLY (N/A) TRANSESOPHAGEAL ECHOCARDIOGRAM (TEE) (N/A) Plan for transfer to step-down: see transfer orders   CV- stable  RESP- continue IS  RENAL- creatinine and lytes OK, weight still up - PO lasix  ENDO- CBG well controlled, dc levemir, continue SSI  Enoxaparin + SCD for DVT prophylaxis  Continue cardiac rehab   LOS: 8 days    Meggen Spaziani C 01/29/2014

## 2014-01-29 NOTE — Plan of Care (Signed)
Problem: Phase III - Recovery through Discharge Goal: Maintain Hemodynamic Stability Outcome: Progressing

## 2014-01-30 ENCOUNTER — Inpatient Hospital Stay (HOSPITAL_COMMUNITY): Payer: Medicare Other

## 2014-01-30 ENCOUNTER — Encounter (HOSPITAL_COMMUNITY): Payer: Self-pay | Admitting: Surgery

## 2014-01-30 LAB — BASIC METABOLIC PANEL
Anion gap: 12 (ref 5–15)
BUN: 15 mg/dL (ref 6–23)
CALCIUM: 8.8 mg/dL (ref 8.4–10.5)
CO2: 25 meq/L (ref 19–32)
CREATININE: 0.84 mg/dL (ref 0.50–1.35)
Chloride: 99 mEq/L (ref 96–112)
GFR calc Af Amer: >90 mL/min (ref >90)
GFR calc non Af Amer: 90 mL/min — ABNORMAL LOW (ref >90)
Glucose, Bld: 120 mg/dL — ABNORMAL HIGH (ref 70–99)
Potassium: 4.5 mEq/L (ref 3.7–5.3)
SODIUM: 136 meq/L — AB (ref 137–147)

## 2014-01-30 LAB — GLUCOSE, CAPILLARY
GLUCOSE-CAPILLARY: 115 mg/dL — AB (ref 70–99)
GLUCOSE-CAPILLARY: 101 mg/dL — AB (ref 70–99)
Glucose-Capillary: 128 mg/dL — ABNORMAL HIGH (ref 70–99)
Glucose-Capillary: 120 mg/dL — ABNORMAL HIGH (ref 70–99)
Glucose-Capillary: 117 mg/dL — ABNORMAL HIGH (ref 70–99)

## 2014-01-30 LAB — CBC
HCT: 29.8 % — ABNORMAL LOW (ref 39.0–52.0)
Hemoglobin: 9.3 g/dL — ABNORMAL LOW (ref 13.0–17.0)
MCH: 26.1 pg (ref 26.0–34.0)
MCHC: 31.2 g/dL (ref 30.0–36.0)
MCV: 83.7 fL (ref 78.0–100.0)
PLATELETS: 193 10*3/uL (ref 150–400)
RBC: 3.56 MIL/uL — AB (ref 4.22–5.81)
RDW: 14.9 % (ref 11.5–15.5)
WBC: 7.7 10*3/uL (ref 4.0–10.5)

## 2014-01-30 MED ORDER — LISINOPRIL 2.5 MG PO TABS
2.5000 mg | ORAL_TABLET | Freq: Every day | ORAL | Status: DC
  Filled 2014-01-30: qty 1

## 2014-01-30 MED ORDER — LISINOPRIL 10 MG PO TABS
10.0000 mg | ORAL_TABLET | Freq: Every day | ORAL | Status: DC
  Administered 2014-01-30: 10 mg via ORAL
  Filled 2014-01-30 (×2): qty 1

## 2014-01-30 MED FILL — Magnesium Sulfate Inj 50%: INTRAMUSCULAR | Qty: 10 | Status: AC

## 2014-01-30 MED FILL — Heparin Sodium (Porcine) Inj 1000 Unit/ML: INTRAMUSCULAR | Qty: 30 | Status: AC

## 2014-01-30 MED FILL — Sodium Chloride IV Soln 0.9%: INTRAVENOUS | Qty: 2000 | Status: AC

## 2014-01-30 MED FILL — Potassium Chloride Inj 2 mEq/ML: INTRAVENOUS | Qty: 40 | Status: AC

## 2014-01-30 MED FILL — Sodium Bicarbonate IV Soln 8.4%: INTRAVENOUS | Qty: 50 | Status: AC

## 2014-01-30 MED FILL — Lidocaine HCl IV Inj 20 MG/ML: INTRAVENOUS | Qty: 5 | Status: AC

## 2014-01-30 MED FILL — Electrolyte-R (PH 7.4) Solution: INTRAVENOUS | Qty: 3000 | Status: AC

## 2014-01-30 MED FILL — Mannitol IV Soln 20%: INTRAVENOUS | Qty: 500 | Status: AC

## 2014-01-30 NOTE — Progress Notes (Signed)
Utilization review completed.  

## 2014-01-30 NOTE — Progress Notes (Signed)
Medicare Important Message given? YES  (If response is "NO", the following Medicare IM given date fields will be blank)  Date Medicare IM given: 01/30/14 Medicare IM given by:  Dahlia Client [employer]

## 2014-01-30 NOTE — Op Note (Signed)
[name], Derek [name]                  ACCOUNT NO.:  [id number]  MEDICAL RECORD NO.:  [id number]  LOCATION:                                 FACILITY:  PHYSICIAN:  Ala Dach, M.D.DATE OF BIRTH:  May 28, 1950  DATE OF PROCEDURE:  01/27/2014 DATE OF DISCHARGE:                              OPERATIVE REPORT   INDICATION FOR PROCEDURE:  [name] is a 63 year old patient of Dr. Kirk Ruths, who presents today for coronary artery bypass grafting to be performed by Dr. Gilford Raid.  He is brought to the holding area the morning of surgery where under local anesthesia with sedation pulmonary artery and radial arterial lines are placed.  He is then taken to the OR for routine induction of general anesthesia, after which the TEE probe was prepared and passed oropharyngeally to the stomach, then slightly withdrawn for imaging of the cardiac structures.  PRECARDIOPULMONARY BYPASS TEE EXAMINATION:  Left ventricle:  The left ventricular chamber is seen initially in the short-axis view.  There is a pattern of mild left ventricular hypertrophy appreciated.  There is a mildly depressed left ventricular function overall.  There does appeared to be essentially normal contractile pattern in the anterior and anterolateral walls.  There is some degree of hypocontractility noted and a portion of the anteroseptal wall in the septal wall area itself. Papillary muscles are well outlined.  No other masses are appreciated.  Mitral valve:  The mitral valve is seen in the four-chamber view.  It is thin, compliant, mobile.  Leaflets are of normal excursion and coaptation area and point.  Color Doppler reveals essentially no mitral regurgitant flow appreciated.  Left and right atria are normal structures and chambers.  The interatrial septum is interrogated and is intact.  Right ventricle:  The right ventricular chamber is seen in the four- chamber view.  It shows a normal contractile pattern and  appreciative pulmonary artery catheter is seen within.  Aortic valve:  Aortic valve is seen initially in the short-axis view. It is in fact trileaflet.  Cusps are thin, compliant and mobile.  There is appeared to be normal opening and closing.  There is no aortic stenosis or insufficiency appreciated.  Tricuspid valve is viewed.  Only trivial tricuspid regurgitant flow is appreciated.  The patient is placed on cardiopulmonary bypass.  Coronary artery bypass grafting is carried out.  The patient's is rewarmed and separated from cardiopulmonary bypass with the initial attempt.  POST CARDIOPULMONARY BYPASS TEE EXAMINATION:  Exam is once again performed in the post bypass period.  The left ventricular chamber remains with good overall contractile pattern except for some mild depression as previously noted.  The rest of the cardiac examination was as previously described without any significant changes.  Overall, the satisfactory left ventricular function is appreciated and the patient was returned to the cardiac intensive care unit in stable condition.          ______________________________ Ala Dach, M.D.     JTM/MEDQ  D:  01/27/2014  T:  01/27/2014  Job:  [id number]

## 2014-01-30 NOTE — Progress Notes (Signed)
CARDIAC REHAB PHASE I   PRE:  Rate/Rhythm: 77 SR  BP:  Supine:   Sitting: 96/66  Standing:    SaO2: 96% 1L  MODE:  Ambulation: 400 ft   POST:  Rate/Rhythm: 102 ST  BP:  Supine:   Sitting: 110/62  Standing:    SaO2: 95%RA 0955-1012 Pt walked 400 ft on RA with asst x 1 with fairly steady gait. Pt dragging right foot which he stated is due to knee replacement and new shoes. Wife to get shoes he is more used to. Told pt not to walk unless assisted as he was a little unsteady with that right foot. Did well off oxygen. Left off since sats 95%RA. To chair after walk. Encouraged pt and wife to view discharge video while he is on bedrest for pacing wire removal.   Graylon Good, RN BSN  01/30/2014 10:08 AM

## 2014-01-30 NOTE — Progress Notes (Signed)
01/30/2014 6:53 AM The patient ambulate about 200 feet in the hallway with the nurse. Patient tolerated the activity well. Will continue to monitor patient. Derek [name]

## 2014-01-30 NOTE — Progress Notes (Signed)
SUBJECTIVE:  Denies dyspnea or chest pain.   OBJECTIVE:   Vitals:   Filed Vitals:   01/29/14 0900 01/29/14 1104 01/29/14 1956 01/30/14 0501  BP: 137/76 133/68 127/72 142/81  Pulse: 95 82 90 78  Temp:  98.5 F (36.9 C) 99.1 F (37.3 C) 98.1 F (36.7 C)  TempSrc:  Oral Oral Oral  Resp: 23 20 24 22   Height:      Weight:    213 lb 6.5 oz (96.8 kg)  SpO2: 90% 93% 99% 98%   I&O's:    Intake/Output Summary (Last 24 hours) at 01/30/14 0726 Last data filed at 01/29/14 1700  Gross per 24 hour  Intake    160 ml  Output     91 ml  Net     69 ml   TELEMETRY: Reviewed telemetry pt in NSR:   PHYSICAL EXAM General: Well developed, well nourished, in no acute distress Head:   Normal  Neck: supple Lungs:  Diminished BS bases Heart:  RRR Abdomen: soft and non-tender, not distended Extremities:  No edema.   Neuro: Grossly intact    LABS: Basic Metabolic Panel:  Recent Labs  01/28/14 0400 01/28/14 1335  01/29/14 0544 01/30/14 0354  NA 139  --   < > 135* 136*  K 4.4  --   < > 4.4 4.5  CL 104  --   < > 98 99  CO2 21  --   --  25 25  GLUCOSE 102*  --   < > 118* 120*  BUN 16  --   < > 19 15  CREATININE 0.81 0.98  < > 0.90 0.84  CALCIUM 8.8  --   --  8.9 8.8  MG 2.2 2.0  --   --   --   < > = values in this interval not displayed. CBC:  Recent Labs  01/29/14 0544 01/30/14 0354  WBC 9.3 7.7  HGB 10.1* 9.3*  HCT 32.2* 29.8*  MCV 84.5 83.7  PLT 199 193   Coag Panel:   Lab Results  Component Value Date   INR 1.26 01/27/2014   INR 1.12 01/26/2014   INR 1.10 01/21/2014    RADIOLOGY: Dg Chest 2 View  01/21/2014   CLINICAL DATA:  Chest discomfort and high blood pressure after starting on prednisone yesterday for gout. New onset cough.  EXAM: CHEST  2 VIEW  COMPARISON:  01/09/2014  FINDINGS: Cardiac enlargement with interstitial and perihilar airspace infiltration suggesting edema or pneumonia. Perihilar infiltrates have increased since previous study. No blunting  of costophrenic angles. No pneumothorax.  IMPRESSION: Increasing perihilar infiltrates since previous study suggesting progression of edema or pneumonia.   Electronically Signed   By: Lucienne Capers M.D.   On: 01/21/2014 05:45   Dg Chest 2 View  01/09/2014   CLINICAL DATA:  Cough for 2 months  EXAM: CHEST  2 VIEW  COMPARISON:  None  FINDINGS: Enlargement of cardiac silhouette with pulmonary vascular congestion.  Mediastinal contours normal.  Peribronchial thickening with accentuation of interstitial markings in a perihilar regions with associated Kerley B-lines at the lung bases favor mild pulmonary edema.  No segmental consolidation, pleural effusion or pneumothorax.  Mild scattered endplate spur formation thoracic spine.  IMPRESSION: Enlargement of cardiac silhouette with pulmonary vascular congestion and probable mild pulmonary edema.   Electronically Signed   By: Lavonia Dana M.D.   On: 01/09/2014 10:53   Dg Chest Port 1 View  01/29/2014   CLINICAL DATA:  Post CABG  EXAM: PORTABLE CHEST - 1 VIEW  COMPARISON:  01/20/2014  FINDINGS: Cardiomegaly. Status post CABG. Right IJ Swan-Ganz catheter has been removed. Right IJ sheath in place. Small left pleural effusion with left basilar atelectasis or infiltrate. Trace right basilar atelectasis. Left chest tube has been removed. No pneumothorax.  IMPRESSION: Right Swan-Ganz catheter has been removed. Left chest tube has been removed. Cardiomegaly. Status post CABG. Small left pleural effusion with left basilar atelectasis or infiltrate. Trace right basilar atelectasis.   Electronically Signed   By: Lahoma Crocker M.D.   On: 01/29/2014 10:37   Dg Chest Port 1 View  01/28/2014   CLINICAL DATA:  Subsequent evaluation status post CABG  EXAM: PORTABLE CHEST - 1 VIEW  COMPARISON:  01/27/2014  FINDINGS: Status post previous median sternotomy. Moderate cardiac silhouette enlargement stable. No change in the position of mediastinal drain, left chest tube, right chest  tube, or Swan-Ganz central venous catheter.  Vascular pattern normal. Mild right and mild-to-moderate left lung base opacities likely atelectasis. No evidence of pneumothorax or pulmonary edema.  IMPRESSION: Anticipated postoperative appearance with bibasilar opacities likely representing atelectasis. Opacity at the left lung base is mildly worse when compared to 01/27/2014.   Electronically Signed   By: Skipper Cliche M.D.   On: 01/28/2014 08:40   Dg Chest Port 1 View  01/27/2014   CLINICAL DATA:  63 year old status post CABG  EXAM: PORTABLE CHEST - 1 VIEW  COMPARISON:  01/21/2014  FINDINGS: An endotracheal tube tip is at the level of clavicles. An enteric tube is seen coursing toward the stomach, the side port is at the gastroesophageal junction. Mediastinal drains project over the chest. A Swan-Ganz catheter tip is slightly proximal to the left pulmonary artery.  The cardiac silhouette is enlarged. Mediastinal contours are within normal limits. New there is mild prominence of interstitial markings. There is no pneumothorax. There is no pleural effusion.  IMPRESSION: 1. Enteric tube with side port at the gastroesophageal junction, advancement is recommended. 2. Swan-Ganz catheter tip appears to be just proximal to the left pulmonary artery. 3. Cardiomegaly and probable pulmonary vascular congestion.   Electronically Signed   By: Rosemarie Ax   On: 01/27/2014 13:42      ASSESSMENT/PLAN:   1 status post myocardial infarction/coronary artery disease-continue aspirin, statin and beta blocker. Doing well following CABG. 2 ischemic cardiomyopathy-continue carvedilol. Add lisinopril 2.5 mg daily. Titrate medications as tolerated by pulse and blood pressure. 3 acute systolic congestive heart failure-continue present dose of Lasix. 4 hyperlipidemia-continue statin. 5 hypertension-follow blood pressure and increased medications as needed.    Lelon Perla, MD  01/30/2014  7:26 AM

## 2014-01-30 NOTE — Progress Notes (Signed)
EPW pulled 1020am. Sherrie Mustache

## 2014-01-30 NOTE — Progress Notes (Addendum)
[address]       [place] [id number]             [phone]      3 Days Post-Op Procedure(s) (LRB): CORONARY ARTERY BYPASS GRAFTING (CABG), ON PUMP, TIMES FOUR, USING LEFT INTERNAL MAMMARY ARTERY, RIGHT GREATER SAPHENOUS VEIN HARVESTED ENDOSCOPICALLY (N/A) TRANSESOPHAGEAL ECHOCARDIOGRAM (TEE) (N/A) Subjective: Feels good, no specific c/o  Objective: Vital signs in last 24 hours: Temp:  [98.1 F (36.7 C)-100.5 F (38.1 C)] 98.1 F (36.7 C) (11/30 0501) Pulse Rate:  [78-95] 78 (11/30 0501) Cardiac Rhythm:  [-] Normal sinus rhythm (11/29 2015) Resp:  [20-24] 22 (11/30 0501) BP: (127-142)/(68-81) 142/81 mmHg (11/30 0501) SpO2:  [90 %-99 %] 98 % (11/30 0501) Weight:  [213 lb 6.5 oz (96.8 kg)] 213 lb 6.5 oz (96.8 kg) (11/30 0501)  Hemodynamic parameters for last 24 hours:    Intake/Output from previous day: 11/29 0701 - 11/30 0700 In: 160 [P.O.:120; I.V.:40] Out: 91 [Urine:91] Intake/Output this shift:    General appearance: alert, cooperative and no distress Heart: regular rate and rhythm Lungs: clear to auscultation bilaterally Abdomen: benign Extremities: + LE edema Wound: incis healing well  Lab Results:  Recent Labs  01/29/14 0544 01/30/14 0354  WBC 9.3 7.7  HGB 10.1* 9.3*  HCT 32.2* 29.8*  PLT 199 193   BMET:  Recent Labs  01/29/14 0544 01/30/14 0354  NA 135* 136*  K 4.4 4.5  CL 98 99  CO2 25 25  GLUCOSE 118* 120*  BUN 19 15  CREATININE 0.90 0.84  CALCIUM 8.9 8.8    PT/INR:  Recent Labs  01/27/14 1310  LABPROT 16.0*  INR 1.26   ABG    Component Value Date/Time   PHART 7.400 01/27/2014 1748   HCO3 22.8 01/27/2014 1748   TCO2 23 01/28/2014 1549   ACIDBASEDEF 2.0 01/27/2014 1748   O2SAT 97.0 01/27/2014 1748   CBG (last 3)   Recent Labs  01/29/14 1612 01/29/14 2129 01/30/14 0614  GLUCAP 113* 115* 120*    Meds Scheduled Meds: . acetaminophen  1,000 mg Oral 4 times per day   Or  . acetaminophen  (TYLENOL) oral liquid 160 mg/5 mL  1,000 mg Per Tube 4 times per day  . aspirin EC  325 mg Oral Daily  . atorvastatin  80 mg Oral q1800  . bisacodyl  10 mg Oral Daily   Or  . bisacodyl  10 mg Rectal Daily  . carvedilol  3.125 mg Oral BID WC  . docusate sodium  200 mg Oral Daily  . enoxaparin (LOVENOX) injection  40 mg Subcutaneous QHS  . furosemide  40 mg Oral Daily  . insulin aspart  0-15 Units Subcutaneous TID WC  . mupirocin ointment  1 application Nasal BID  . pantoprazole  40 mg Oral Daily  . potassium chloride  20 mEq Oral BID  . sodium chloride  3 mL Intravenous Q12H   Continuous Infusions:  PRN Meds:.sodium chloride, alum & mag hydroxide-simeth, magnesium hydroxide, ondansetron (ZOFRAN) IV, oxyCODONE, sodium chloride, traMADol, zolpidem  Xrays Dg Chest Port 1 View  01/29/2014   CLINICAL DATA:  Post CABG  EXAM: PORTABLE CHEST - 1 VIEW  COMPARISON:  01/20/2014  FINDINGS: Cardiomegaly. Status post CABG. Right IJ Swan-Ganz catheter has been removed. Right IJ sheath in place. Small left pleural effusion with left basilar atelectasis or infiltrate. Trace right basilar atelectasis. Left chest tube has been removed. No pneumothorax.  IMPRESSION: Right Swan-Ganz  catheter has been removed. Left chest tube has been removed. Cardiomegaly. Status post CABG. Small left pleural effusion with left basilar atelectasis or infiltrate. Trace right basilar atelectasis.   Electronically Signed   By: Lahoma Crocker M.D.   On: 01/29/2014 10:37    Assessment/Plan: S/P Procedure(s) (LRB): CORONARY ARTERY BYPASS GRAFTING (CABG), ON PUMP, TIMES FOUR, USING LEFT INTERNAL MAMMARY ARTERY, RIGHT GREATER SAPHENOUS VEIN HARVESTED ENDOSCOPICALLY (N/A) TRANSESOPHAGEAL ECHOCARDIOGRAM (TEE) (N/A)  1 doing well, in sinus rhythm 2 will increase ace for better BP control 3 labs stable 4 cont gentle diuresis 5 routine pulm toilet/ cardiac rehab 6 d/c epw's , poss d/c 1-2 days    LOS: 9 days    [name],[name] 01/30/2014   Chart reviewed, patient examined, agree with above. Weight still up 11 lbs over preop. Continue diuresis. He is walking well but still on oxygen. Bowels working. Plan home Wednesday if things remain stable.

## 2014-01-31 LAB — GLUCOSE, CAPILLARY
GLUCOSE-CAPILLARY: 111 mg/dL — AB (ref 70–99)
GLUCOSE-CAPILLARY: 94 mg/dL (ref 70–99)
GLUCOSE-CAPILLARY: 136 mg/dL — AB (ref 70–99)
GLUCOSE-CAPILLARY: 115 mg/dL — AB (ref 70–99)
Glucose-Capillary: 100 mg/dL — ABNORMAL HIGH (ref 70–99)

## 2014-01-31 MED ORDER — FUROSEMIDE 80 MG PO TABS
80.0000 mg | ORAL_TABLET | Freq: Once | ORAL | Status: AC
  Administered 2014-01-31: 80 mg via ORAL
  Filled 2014-01-31: qty 1

## 2014-01-31 MED ORDER — LISINOPRIL 20 MG PO TABS
20.0000 mg | ORAL_TABLET | Freq: Every day | ORAL | Status: DC
  Administered 2014-01-31: 20 mg via ORAL
  Filled 2014-01-31 (×2): qty 1

## 2014-01-31 NOTE — Progress Notes (Signed)
Pt ambulated 650 ft this am. With wife. Pt denied SOB.

## 2014-01-31 NOTE — Progress Notes (Signed)
SUBJECTIVE:  Denies dyspnea or chest pain.   OBJECTIVE:   Vitals:   Filed Vitals:   01/30/14 1411 01/30/14 2100 01/31/14 0500 01/31/14 0759  BP: 109/61 136/72 124/70   Pulse: 79 83 78 80  Temp: 98.4 F (36.9 C) 99 F (37.2 C) 98.1 F (36.7 C)   TempSrc: Oral Oral Oral   Resp: 18 18 18    Height:      Weight:   213 lb 12.8 oz (96.979 kg)   SpO2: 95% 95% 96%    I&O's:    Intake/Output Summary (Last 24 hours) at 01/31/14 9357 Last data filed at 01/30/14 2328  Gross per 24 hour  Intake    483 ml  Output      0 ml  Net    483 ml   TELEMETRY: Reviewed telemetry pt in NSR:   PHYSICAL EXAM General: Well developed, well nourished, in no acute distress Head:   Normal  Neck: supple Lungs:  Mild basilar crackles Heart:  RRR Abdomen: soft and non-tender, not distended Extremities:  1+ ankle edema.   Neuro: Grossly intact    LABS: Basic Metabolic Panel:  Recent Labs  01/28/14 1335  01/29/14 0544 01/30/14 0354  NA  --   < > 135* 136*  K  --   < > 4.4 4.5  CL  --   < > 98 99  CO2  --   --  25 25  GLUCOSE  --   < > 118* 120*  BUN  --   < > 19 15  CREATININE 0.98  < > 0.90 0.84  CALCIUM  --   --  8.9 8.8  MG 2.0  --   --   --   < > = values in this interval not displayed. CBC:  Recent Labs  01/29/14 0544 01/30/14 0354  WBC 9.3 7.7  HGB 10.1* 9.3*  HCT 32.2* 29.8*  MCV 84.5 83.7  PLT 199 193   Coag Panel:   Lab Results  Component Value Date   INR 1.26 01/27/2014   INR 1.12 01/26/2014   INR 1.10 01/21/2014    RADIOLOGY: Dg Chest 2 View  01/30/2014   CLINICAL DATA:  CABG 01/27/2014  EXAM: CHEST  2 VIEW  COMPARISON:  01/29/2014  FINDINGS: Improved lung volume. Improvement in bibasilar atelectasis. Negative for heart failure or edema. Minimal pleural effusion bilaterally. Right jugular sheath has been removed. No pneumothorax.  IMPRESSION: Improved aeration with decrease in bibasilar atelectasis and improved lung volume.   Electronically Signed   By:  Franchot Gallo M.D.   On: 01/30/2014 07:34   Dg Chest 2 View  01/21/2014   CLINICAL DATA:  Chest discomfort and high blood pressure after starting on prednisone yesterday for gout. New onset cough.  EXAM: CHEST  2 VIEW  COMPARISON:  01/09/2014  FINDINGS: Cardiac enlargement with interstitial and perihilar airspace infiltration suggesting edema or pneumonia. Perihilar infiltrates have increased since previous study. No blunting of costophrenic angles. No pneumothorax.  IMPRESSION: Increasing perihilar infiltrates since previous study suggesting progression of edema or pneumonia.   Electronically Signed   By: Lucienne Capers M.D.   On: 01/21/2014 05:45   Dg Chest 2 View  01/09/2014   CLINICAL DATA:  Cough for 2 months  EXAM: CHEST  2 VIEW  COMPARISON:  None  FINDINGS: Enlargement of cardiac silhouette with pulmonary vascular congestion.  Mediastinal contours normal.  Peribronchial thickening with accentuation of interstitial markings in a perihilar regions with associated  Kerley B-lines at the lung bases favor mild pulmonary edema.  No segmental consolidation, pleural effusion or pneumothorax.  Mild scattered endplate spur formation thoracic spine.  IMPRESSION: Enlargement of cardiac silhouette with pulmonary vascular congestion and probable mild pulmonary edema.   Electronically Signed   By: Lavonia Dana M.D.   On: 01/09/2014 10:53   Dg Chest Port 1 View  01/29/2014   CLINICAL DATA:  Post CABG  EXAM: PORTABLE CHEST - 1 VIEW  COMPARISON:  01/20/2014  FINDINGS: Cardiomegaly. Status post CABG. Right IJ Swan-Ganz catheter has been removed. Right IJ sheath in place. Small left pleural effusion with left basilar atelectasis or infiltrate. Trace right basilar atelectasis. Left chest tube has been removed. No pneumothorax.  IMPRESSION: Right Swan-Ganz catheter has been removed. Left chest tube has been removed. Cardiomegaly. Status post CABG. Small left pleural effusion with left basilar atelectasis or infiltrate.  Trace right basilar atelectasis.   Electronically Signed   By: Lahoma Crocker M.D.   On: 01/29/2014 10:37   Dg Chest Port 1 View  01/28/2014   CLINICAL DATA:  Subsequent evaluation status post CABG  EXAM: PORTABLE CHEST - 1 VIEW  COMPARISON:  01/27/2014  FINDINGS: Status post previous median sternotomy. Moderate cardiac silhouette enlargement stable. No change in the position of mediastinal drain, left chest tube, right chest tube, or Swan-Ganz central venous catheter.  Vascular pattern normal. Mild right and mild-to-moderate left lung base opacities likely atelectasis. No evidence of pneumothorax or pulmonary edema.  IMPRESSION: Anticipated postoperative appearance with bibasilar opacities likely representing atelectasis. Opacity at the left lung base is mildly worse when compared to 01/27/2014.   Electronically Signed   By: Skipper Cliche M.D.   On: 01/28/2014 08:40   Dg Chest Port 1 View  01/27/2014   CLINICAL DATA:  63 year old status post CABG  EXAM: PORTABLE CHEST - 1 VIEW  COMPARISON:  01/21/2014  FINDINGS: An endotracheal tube tip is at the level of clavicles. An enteric tube is seen coursing toward the stomach, the side port is at the gastroesophageal junction. Mediastinal drains project over the chest. A Swan-Ganz catheter tip is slightly proximal to the left pulmonary artery.  The cardiac silhouette is enlarged. Mediastinal contours are within normal limits. New there is mild prominence of interstitial markings. There is no pneumothorax. There is no pleural effusion.  IMPRESSION: 1. Enteric tube with side port at the gastroesophageal junction, advancement is recommended. 2. Swan-Ganz catheter tip appears to be just proximal to the left pulmonary artery. 3. Cardiomegaly and probable pulmonary vascular congestion.   Electronically Signed   By: Rosemarie Ax   On: 01/27/2014 13:42      ASSESSMENT/PLAN:   1 status post myocardial infarction/coronary artery disease-continue aspirin, statin and  beta blocker. Doing well following CABG. 2 ischemic cardiomyopathy-continue carvedilol and lisinopril. Titrate medications as tolerated by pulse and blood pressure. 3 acute systolic congestive heart failure-continue present dose of Lasix. Remains volume overloaded 4 hyperlipidemia-continue statin. 5 hypertension-follow blood pressure and increased medications as needed.    Lelon Perla, MD  01/31/2014  9:14 AM

## 2014-01-31 NOTE — Discharge Summary (Signed)
Physician Discharge Summary  Patient ID: Ladanian Kelter MRN: [id number] DOB/AGE: 65-23-1952 65 y.o.  Admit date: 01/21/2014 Discharge date: 01/31/2014  Admission Diagnoses: Severe multivessel coronary artery disease  Discharge Diagnoses:  Active Problems:   Systolic and diastolic CHF, acute   NSTEMI (non-ST elevated myocardial infarction)   HTN (hypertension)   Obesity   Prediabetes   S/P CABG x 4  Patient Active Problem List   Diagnosis Date Noted  . 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 HTN (hypertension) 01/23/2014  . Obesity 01/23/2014  . Prediabetes 01/23/2014  . Systolic and diastolic CHF, acute 02/63/7858  . NSTEMI (non-ST elevated myocardial infarction) 01/21/2014  . Congestive heart failure 01/13/2014  . HTN (hypertension), malignant 01/13/2014  . LVH (left ventricular hypertrophy) due to hypertensive disease 01/13/2014      History of the present illness: The patient is a 63 year old male who has not been seen by a doctor in several years who recently presented with a persistent cough and marked hypertension to [facility].. A BNP was significantly elevated at 1971. He was started on Coreg, and allopurinol, and Lasix with improvement in his cough and blood pressure. He was referred to Dr. [name] for cardiology evaluation. Echocardiogram showed an ejection fraction of 40-45% with severe hypokinesis of the apical anteroseptal, inferior and inferoseptal myocardium. There was grade 2 diastolic dysfunction. ECG showed findings consistent with left ventricular hypertrophy. He re-presented to [facility] with symptoms of gout which he has a previous history of. He was found again to have findings consistent with congestive failure and also elevated blood pressure. He also had elevated troponin and was admitted to [facility] for further evaluation and treatment to include cardiac catheterization.   Past Medical History  Diagnosis Date  . Hypertension   . Gout      Surgical History:  Past Surgical History  Procedure Laterality Date  . Knee surgery      I have reviewed the patient's current medications. Prior to Admission medications   Medication Sig Start Date End Date Taking? Authorizing Provider  aspirin 81 MG tablet Take 81 mg by mouth daily.    Historical Provider, MD  carvedilol (COREG) 3.125 MG tablet Take 1 tablet (3.125 mg total) by mouth 2 (two) times daily with a meal. 01/13/14   [name], MD  Cholecalciferol (VITAMIN D-3) 5000 UNITS TABS Take by mouth daily.    Historical Provider, MD  enalapril (VASOTEC) 10 MG tablet Take 0.5 tablets (5 mg total) by mouth daily. 01/13/14   [name], MD  furosemide (LASIX) 20 MG tablet Take 1 tablet (20 mg total) by mouth daily. 01/13/14   [name], MD  L-Tryptophan 500 MG CAPS Take 1,000 mg by mouth daily.    Historical Provider, MD  Melatonin 5 MG TABS Take by mouth at bedtime.    Historical Provider, MD  Multiple Vitamin (MULTIVITAMIN) capsule Take 1 capsule by mouth daily.    Historical Provider, MD  Nutritional Supplements (GRAPESEED EXTRACT PO) Take by mouth daily. Resveratrol    Historical Provider, MD  Omega-3 Fatty Acids (OMEGA 3 PO) Take 1,280 mg by mouth daily.    Historical Provider, MD  predniSONE (DELTASONE) 20 MG tablet 3 tabs po day one, then 2 tabs daily x 4 days 01/20/14   Babette Relic, MD  Valerian 500 MG CAPS Take 2 capsules by mouth daily.    Historical Provider, MD   Allergies:  Allergies  Allergen Reactions  .  Penicillins Anaphylaxis    History   Social History  . Marital Status: Married    Spouse Name: N/A    Number of Children: N/A  . Years of Education: N/A   Occupational History  . Dentist    Social History Main Topics  . Smoking status: Never Smoker   . Smokeless tobacco: Never Used  . Alcohol Use: Yes      Comment: 1-2 drinks/week  . Drug Use: No  . Sexual Activity: Not on file   Other Topics Concern  . Not on file   Social History Narrative   Lives with wife    Family History  Problem Relation Age of Onset  . Cancer Mother   . Cancer Father    Family Status  Relation Status Death Age  . Mother Deceased   . Father Deceased     Discharged Condition: good  Hospital Course: The patient was admitted and started on a course of gentle diuresis. He was additionally started on aspirin, heparin, statin, beta blocker and Ace inhibitor. Cardiac catheterization was scheduled. This was performed on 01/23/2014 by Dr. Sherren Mocha. He was found to have severe three-vessel coronary artery disease with total occlusion of the right coronary artery and left to right collaterals, severe stenosis of the LAD, and diffuse stenosis of the left circumflex. Additionally moderately severe segmental LV systolic dysfunction was noted. Due to these findings cardiothoracic surgical consultation was obtained with Gilford Raid M.D. Dr. [name] evaluated the patient and his studies and agreed with recommendations to proceed with coronary artery surgical revascularization.   Treatments:   CARDIOVASCULAR SURGERY OPERATIVE NOTE  01/27/2014  Surgeon: Gaye Pollack, MD  First Assistant: Jadene Pierini, PA-C   Preoperative Diagnosis: Severe multi-vessel coronary artery disease   Postoperative Diagnosis: Same   Procedure:  1. Median Sternotomy 2. Extracorporeal circulation 3. Coronary artery bypass grafting x 4   Left internal mammary graft to the LAD  SVG to diagonal  SVG to OM  SVG to PDA 4. Endoscopic vein harvest from the right leg   Anesthesia: General Endotracheal  Postoperative hospital course:  Overall the patient has done well. He has  maintained stable hemodynamics. He was weaned from the ventilator without difficulty using standard protocols. All routine lines, monitors and drainage devices have been discontinued in the standard fashion. He does have moderate postoperative volume overload but is responding well to diuretics. He has maintained normal sinus rhythm. He is tolerating gradually increasing activities using standard cardiac rehabilitation protocols. Incisions are noted to be healing well without evidence of infection. Oxygen has been weaned and he maintains adequate saturations on room air. He is tolerating diet. He does have a mild acute blood loss anemia which has stabilized. His overall status is felt to be tentatively stable for discharge in the next 24-48 hours pending ongoing reevaluation of his recovery.  Discharge Exam: Blood pressure 124/70, pulse 80, temperature 98.1 F (36.7 C), temperature source Oral, resp. rate 18, height 5\' 8"  (1.727 m), weight 213 lb 12.8 oz (96.979 kg), SpO2 96 %.   General appearance: alert, cooperative and no distress Heart: regular rate and rhythm Lungs: clear to auscultation bilaterally Abdomen: benign Extremities: + LE edema Wound: incis healing well  Disposition: 01-Home or Self Care    Medications at time of discharge:   Medication List    STOP taking these medications        aspirin 81 MG tablet  Replaced by:  aspirin 325 MG EC tablet  enalapril 10 MG tablet  Commonly known as:  VASOTEC      TAKE these medications        aspirin 325 MG EC tablet  Take 1 tablet (325 mg total) by mouth daily.     atorvastatin 80 MG tablet  Commonly known as:  LIPITOR  Take 1 tablet (80 mg total) by mouth daily at 6 PM.     carvedilol 3.125 MG tablet  Commonly known as:  COREG  Take 1 tablet (3.125 mg total) by mouth 2 (two) times daily with a meal.     furosemide 40 MG tablet  Commonly known as:  LASIX  Take 1 tablet (40 mg total) by mouth daily.     GRAPESEED  EXTRACT PO  Take by mouth daily. Resveratrol     L-Tryptophan 500 MG Caps  Take 1,000 mg by mouth daily.     lisinopril 20 MG tablet  Commonly known as:  PRINIVIL,ZESTRIL  Take 1 tablet (20 mg total) by mouth daily.     Melatonin 5 MG Tabs  Take by mouth at bedtime.     multivitamin capsule  Take 1 capsule by mouth daily.     OMEGA 3 PO  Take 1,280 mg by mouth daily.     oxyCODONE 5 MG immediate release tablet  Commonly known as:  Oxy IR/ROXICODONE  Take 1-2 tablets (5-10 mg total) by mouth every 4 (four) hours as needed for severe pain.     potassium chloride SA 20 MEQ tablet  Commonly known as:  K-DUR,KLOR-CON  Take 1 tablet (20 mEq total) by mouth daily.     Valerian 500 MG Caps  Take 2 capsules by mouth daily.     Vitamin D-3 5000 UNITS Tabs  Take 1 tablet by mouth daily.       Follow-up Information    Follow up with Gaye Pollack, MD.   Specialty:  Cardiothoracic Surgery   Why:  03/08/2013 at noon to see the surgeon. Please obtain a chest x-ray a [facility] 1 hour prior to this appointment. [facility] is located in the same office complex.   Contact information:   [address] [address] [phone]       Follow up with [facility].   Why:  12/82015 at 10:00 am for nurse at Dr Vivi Martens to remove sutures      Follow up with [name], MD.   Specialty:  Cardiology   Why:  02/17/2014 at 9:45   Contact information:   [address] [place] [address] [phone]      The patient has been discharged on:   1.Beta Blocker:  Yes Blue.Reese   ]                              No   [   ]                              If No, reason:  2.Ace Inhibitor/ARB: Yes [ y  ]                                     No  [    ]  If No, reason:  3.Statin:   Yes Blue.Reese   ]                  No  [   ]                  If No, reason:  4.Ecasa:  Yes  [ y  ]                  No   [    ]                  If No, reason:  Signed: Glorimar Stroope E 01/31/2014, 8:46 AM

## 2014-01-31 NOTE — Discharge Instructions (Signed)
Endoscopic Saphenous Vein Harvesting °Care After °Refer to this sheet in the next few weeks. These instructions provide you with information on caring for yourself after your procedure. Your health care provider may also give you more specific instructions. Your treatment has been planned according to current medical practices, but problems sometimes occur. Call your health care provider if you have any problems or questions after your procedure. °HOME CARE INSTRUCTIONS °Medicine °· Take whatever pain medicine your surgeon prescribes. Follow the directions carefully. Do not take over-the-counter pain medicine unless your surgeon says it is okay. Some pain medicine can cause bleeding problems for several weeks after surgery. °· Follow your surgeon's instructions about driving. You will probably not be permitted to drive after heart surgery. °· Take any medicines your surgeon prescribes. Any medicines you took before your heart surgery should be checked with your health care provider before you start taking them again. °Wound care °· If your surgeon has prescribed an elastic bandage or stocking, ask how long you should [place] it. °· Check the area around your surgical cuts (incisions) whenever your bandages (dressings) are changed. Look for any redness or swelling. °· You will need to return to have the stitches (sutures) or staples taken out. Ask your surgeon when to do that. °· Ask your surgeon when you can shower or bathe. °Activity °· Try to keep your legs raised when you are sitting. °· Do any exercises your health care providers have given you. These may include deep breathing exercises, coughing, walking, or other exercises. °SEEK MEDICAL CARE IF: °· You have any questions about your medicines. °· You have more leg pain, especially if your pain medicine stops working. °· New or growing bruises develop on your leg. °· Your leg swells, feels tight, or becomes red. °· You have numbness in your leg. °SEEK IMMEDIATE  MEDICAL CARE IF: °· Your pain gets much worse. °· Blood or fluid leaks from any of the incisions. °· Your incisions become warm, swollen, or red. °· You have chest pain. °· You have trouble breathing. °· You have a fever. °· You have more pain near your leg incision. °MAKE SURE YOU: °· Understand these instructions. °· Will watch your condition. °· Will get help right away if you are not doing well or get worse. °Document Released: 10/30/2010 Document Revised: 02/22/2013 Document Reviewed: 10/30/2010 °ExitCare® Patient Information ©2015 [employer]. This information is not intended to replace advice given to you by your health care provider. Make sure you discuss any questions you have with your health care provider. °Coronary Artery Bypass Grafting, Care After °These instructions give you information on caring for yourself after your procedure. Your doctor may also give you more specific instructions. Call your doctor if you have any problems or questions after your procedure.  °HOME CARE °· Only take medicine as told by your doctor. Take medicines exactly as told. Do not stop taking medicines or start any new medicines without talking to your doctor first. °· Take your pulse as told by your doctor. °· Do deep breathing as told by your doctor. Use your breathing device (incentive spirometer), if given, to practice deep breathing several times a day. Support your chest with a pillow or your arms when you take deep breaths or cough. °· Keep the area clean, dry, and protected where the surgery cuts (incisions) were made. Remove bandages (dressings) only as told by your doctor. If strips were applied to surgical area, do not take them off. They fall off   on their own. °· Check the surgery area daily for puffiness (swelling), redness, or leaking fluid. °· If surgery cuts were made in your legs: °· Avoid crossing your legs. °· Avoid sitting for long periods of time. Change positions every 30 minutes. °· Raise your legs  when you are sitting. Place them on pillows. °· Kilfoyle stockings that help keep blood clots from forming in your legs (compression stockings). °· Only take sponge baths until your doctor says it is okay to take showers. Pat the surgery area dry. Do not rub the surgery area with a washcloth or towel. Do not bathe, swim, or use a hot tub until your doctor says it is okay. °· Eat foods that are high in fiber. These include raw fruits and vegetables, whole grains, beans, and nuts. Choose lean meats. Avoid canned, processed, and fried foods. °· Drink enough fluids to keep your pee (urine) clear or pale yellow. °· Weigh yourself every day. °· Rest and limit activity as told by your doctor. You may be told to: °· Stop any activity if you have chest pain, shortness of breath, changes in heartbeat, or dizziness. Get help right away if this happens. °· Move around often for short amounts of time or take short walks as told by your doctor. Gradually become more active. You may need help to strengthen your muscles and build endurance. °· Avoid lifting, pushing, or pulling anything heavier than 10 pounds (4.5 kg) for at least 6 weeks after surgery. °· Do not drive until your doctor says it is okay. °· Ask your doctor when you can go back to work. °· Ask your doctor when you can begin sexual activity again. °· Follow up with your doctor as told. °GET HELP IF: °· You have puffiness, redness, more pain, or fluid draining from the incision site. °· You have a fever. °· You have puffiness in your ankles or legs. °· You have pain in your legs. °· You gain 2 or more pounds (0.9 kg) a day. °· You feel sick to your stomach (nauseous) or throw up (vomit). °· You have watery poop (diarrhea). °GET HELP RIGHT AWAY IF: °· You have chest pain that goes to your jaw or arms. °· You have shortness of breath. °· You have a fast or irregular heartbeat. °· You notice a "clicking" in your breastbone when you move. °· You have numbness or weakness in  your arms or legs. °· You feel dizzy or light-headed. °MAKE SURE YOU: °· Understand these instructions. °· Will watch your condition. °· Will get help right away if you are not doing well or get worse. °Document Released: 02/22/2013 Document Reviewed: 02/22/2013 °ExitCare® Patient Information ©2015 [employer]. This information is not intended to replace advice given to you by your health care provider. Make sure you discuss any questions you have with your health care provider. ° °

## 2014-01-31 NOTE — Progress Notes (Signed)
Pt ambulating in hall q2h with wife. Sherrie Mustache 5:27 PM

## 2014-01-31 NOTE — Progress Notes (Signed)
[id number] Pt stated he has walked 4 times already. Completed education with pt who voiced understanding. Discussed sternal precautions, IS, ex ed, diet, and CRP 2. Pt gave permission for referral to [facility]. Graylon Good RN BSN 01/31/2014 2:43 PM

## 2014-01-31 NOTE — Progress Notes (Addendum)
[address]       [place] [address]             [phone]      4 Days Post-Op Procedure(s) (LRB): CORONARY ARTERY BYPASS GRAFTING (CABG), ON PUMP, TIMES FOUR, USING LEFT INTERNAL MAMMARY ARTERY, RIGHT GREATER SAPHENOUS VEIN HARVESTED ENDOSCOPICALLY (N/A) TRANSESOPHAGEAL ECHOCARDIOGRAM (TEE) (N/A) Subjective: Feels well, no complaints  Objective: Vital signs in last 24 hours: Temp:  [98.1 F (36.7 C)-99 F (37.2 C)] 98.1 F (36.7 C) (12/01 0500) Pulse Rate:  [78-87] 78 (12/01 0500) Cardiac Rhythm:  [-] Normal sinus rhythm (11/30 1958) Resp:  [18-20] 18 (12/01 0500) BP: (109-150)/(60-73) 124/70 mmHg (12/01 0500) SpO2:  [95 %-96 %] 96 % (12/01 0500) Weight:  [213 lb 12.8 oz (96.979 kg)] 213 lb 12.8 oz (96.979 kg) (12/01 0500)  Hemodynamic parameters for last 24 hours:    Intake/Output from previous day: 11/30 0701 - 12/01 0700 In: 603 [P.O.:600; I.V.:3] Out: -  Intake/Output this shift:    General appearance: alert, cooperative and no distress Heart: regular rate and rhythm Lungs: minor basilar crackles Abdomen: benign Extremities: + LE edema- improved Wound: incis healing well  Lab Results:  Recent Labs  01/29/14 0544 01/30/14 0354  WBC 9.3 7.7  HGB 10.1* 9.3*  HCT 32.2* 29.8*  PLT 199 193   BMET:  Recent Labs  01/29/14 0544 01/30/14 0354  NA 135* 136*  K 4.4 4.5  CL 98 99  CO2 25 25  GLUCOSE 118* 120*  BUN 19 15  CREATININE 0.90 0.84  CALCIUM 8.9 8.8    PT/INR: No results for input(s): LABPROT, INR in the last 72 hours. ABG    Component Value Date/Time   PHART 7.400 01/27/2014 1748   HCO3 22.8 01/27/2014 1748   TCO2 23 01/28/2014 1549   ACIDBASEDEF 2.0 01/27/2014 1748   O2SAT 97.0 01/27/2014 1748   CBG (last 3)   Recent Labs  01/30/14 1124 01/30/14 1636 01/30/14 2121  GLUCAP 117* 101* 111*    Meds Scheduled Meds: . acetaminophen  1,000 mg Oral 4 times per day   Or  . acetaminophen (TYLENOL) oral  liquid 160 mg/5 mL  1,000 mg Per Tube 4 times per day  . aspirin EC  325 mg Oral Daily  . atorvastatin  80 mg Oral q1800  . bisacodyl  10 mg Oral Daily   Or  . bisacodyl  10 mg Rectal Daily  . carvedilol  3.125 mg Oral BID WC  . docusate sodium  200 mg Oral Daily  . enoxaparin (LOVENOX) injection  40 mg Subcutaneous QHS  . furosemide  40 mg Oral Daily  . insulin aspart  0-15 Units Subcutaneous TID WC  . lisinopril  10 mg Oral Daily  . mupirocin ointment  1 application Nasal BID  . pantoprazole  40 mg Oral Daily  . potassium chloride  20 mEq Oral BID  . sodium chloride  3 mL Intravenous Q12H   Continuous Infusions:  PRN Meds:.sodium chloride, alum & mag hydroxide-simeth, magnesium hydroxide, ondansetron (ZOFRAN) IV, oxyCODONE, sodium chloride, traMADol, zolpidem  Xrays Dg Chest 2 View  01/30/2014   CLINICAL DATA:  CABG 01/27/2014  EXAM: CHEST  2 VIEW  COMPARISON:  01/29/2014  FINDINGS: Improved lung volume. Improvement in bibasilar atelectasis. Negative for heart failure or edema. Minimal pleural effusion bilaterally. Right jugular sheath has been removed. No pneumothorax.  IMPRESSION: Improved aeration with decrease in bibasilar atelectasis and improved lung volume.  Electronically Signed   By: Franchot Gallo M.D.   On: 01/30/2014 07:34    Assessment/Plan: S/P Procedure(s) (LRB): CORONARY ARTERY BYPASS GRAFTING (CABG), ON PUMP, TIMES FOUR, USING LEFT INTERNAL MAMMARY ARTERY, RIGHT GREATER SAPHENOUS VEIN HARVESTED ENDOSCOPICALLY (N/A) TRANSESOPHAGEAL ECHOCARDIOGRAM (TEE) (N/A)  1 conts to make excellent progress 2 I think there is still room to increase ACE I 3 cont to diurese 4 sugars good control 5 rhythm stable in sinus 6 poss home in am    LOS: 10 days    GOLD,WAYNE E 01/31/2014   Chart reviewed, patient examined, agree with above. He is doing well. Wt is still up 11 lbs over preop so would send him home on lasix 40 daily and potassium 20. I would not increase ACE I  further until he is fully diuresed. This can be adjusted in the office.

## 2014-02-01 LAB — GLUCOSE, CAPILLARY: Glucose-Capillary: 106 mg/dL — ABNORMAL HIGH (ref 70–99)

## 2014-02-01 MED ORDER — POTASSIUM CHLORIDE CRYS ER 20 MEQ PO TBCR: 20.0000 meq | EXTENDED_RELEASE_TABLET | Freq: Every day | ORAL | Status: DC

## 2014-02-01 MED ORDER — TRAMADOL HCL 50 MG PO TABS: 50.0000 mg | ORAL_TABLET | Freq: Four times a day (QID) | ORAL | Status: DC | PRN

## 2014-02-01 MED ORDER — FUROSEMIDE 40 MG PO TABS: 40.0000 mg | ORAL_TABLET | Freq: Every day | ORAL | Status: DC

## 2014-02-01 MED ORDER — LISINOPRIL 20 MG PO TABS: 20.0000 mg | ORAL_TABLET | Freq: Every day | ORAL | Status: DC

## 2014-02-01 MED ORDER — ATORVASTATIN CALCIUM 80 MG PO TABS: 80.0000 mg | ORAL_TABLET | Freq: Every day | ORAL | Status: DC

## 2014-02-01 MED ORDER — OXYCODONE HCL 5 MG PO TABS: 5.0000 mg | ORAL_TABLET | ORAL | Status: DC | PRN

## 2014-02-01 MED ORDER — ASPIRIN 325 MG PO TBEC
325.0000 mg | DELAYED_RELEASE_TABLET | Freq: Every day | ORAL | Status: DC
Start: 2014-02-01 — End: 2014-08-04

## 2014-02-01 NOTE — Progress Notes (Signed)
      [address]       [place] [address]             [phone]      5 Days Post-Op Procedure(s) (LRB): CORONARY ARTERY BYPASS GRAFTING (CABG), ON PUMP, TIMES FOUR, USING LEFT INTERNAL MAMMARY ARTERY, RIGHT GREATER SAPHENOUS VEIN HARVESTED ENDOSCOPICALLY (N/A) TRANSESOPHAGEAL ECHOCARDIOGRAM (TEE) (N/A) Subjective: Feels very well, no new complaints  Objective: Vital signs in last 24 hours: Temp:  [98 F (36.7 C)-98.9 F (37.2 C)] 98.5 F (36.9 C) (12/02 0551) Pulse Rate:  [73-84] 73 (12/02 0551) Cardiac Rhythm:  [-] Normal sinus rhythm (12/01 1900) Resp:  [18] 18 (12/02 0551) BP: (121-134)/(62-76) 134/76 mmHg (12/02 0551) SpO2:  [94 %-98 %] 98 % (12/02 0551)  Hemodynamic parameters for last 24 hours:    Intake/Output from previous day: 12/01 0701 - 12/02 0700 In: 1080 [P.O.:1080] Out: -  Intake/Output this shift:    General appearance: alert, cooperative and no distress Heart: regular rate and rhythm Lungs: clear to auscultation bilaterally Abdomen: benign Extremities: + LE edema Wound: incis healing well  Lab Results:  Recent Labs  01/30/14 0354  WBC 7.7  HGB 9.3*  HCT 29.8*  PLT 193   BMET:  Recent Labs  01/30/14 0354  NA 136*  K 4.5  CL 99  CO2 25  GLUCOSE 120*  BUN 15  CREATININE 0.84  CALCIUM 8.8    PT/INR: No results for input(s): LABPROT, INR in the last 72 hours. ABG    Component Value Date/Time   PHART 7.400 01/27/2014 1748   HCO3 22.8 01/27/2014 1748   TCO2 23 01/28/2014 1549   ACIDBASEDEF 2.0 01/27/2014 1748   O2SAT 97.0 01/27/2014 1748   CBG (last 3)   Recent Labs  01/31/14 1701 01/31/14 2114 02/01/14 0647  GLUCAP 136* 115* 106*    Meds Scheduled Meds: . acetaminophen  1,000 mg Oral 4 times per day   Or  . acetaminophen (TYLENOL) oral liquid 160 mg/5 mL  1,000 mg Per Tube 4 times per day  . aspirin EC  325 mg Oral Daily  . atorvastatin  80 mg Oral q1800  . bisacodyl  10 mg Oral Daily   Or    . bisacodyl  10 mg Rectal Daily  . carvedilol  3.125 mg Oral BID WC  . docusate sodium  200 mg Oral Daily  . enoxaparin (LOVENOX) injection  40 mg Subcutaneous QHS  . furosemide  40 mg Oral Daily  . insulin aspart  0-15 Units Subcutaneous TID WC  . lisinopril  20 mg Oral Daily  . pantoprazole  40 mg Oral Daily  . potassium chloride  20 mEq Oral BID  . sodium chloride  3 mL Intravenous Q12H   Continuous Infusions:  PRN Meds:.sodium chloride, alum & mag hydroxide-simeth, magnesium hydroxide, ondansetron (ZOFRAN) IV, oxyCODONE, sodium chloride, traMADol, zolpidem  Xrays No results found.  Assessment/Plan: S/P Procedure(s) (LRB): CORONARY ARTERY BYPASS GRAFTING (CABG), ON PUMP, TIMES FOUR, USING LEFT INTERNAL MAMMARY ARTERY, RIGHT GREATER SAPHENOUS VEIN HARVESTED ENDOSCOPICALLY (N/A) TRANSESOPHAGEAL ECHOCARDIOGRAM (TEE) (N/A) Plan for discharge: see discharge orders   LOS: 11 days    Almir Botts E 02/01/2014

## 2014-02-01 NOTE — Progress Notes (Signed)
Pt up ambulating in hallway with wife; will cont. To monitor; no needs.

## 2014-02-01 NOTE — Plan of Care (Signed)
Problem: Phase III - Recovery through Discharge Goal: Maintain Hemodynamic Stability Outcome: Completed/Met Date Met:  02/01/14

## 2014-02-01 NOTE — Progress Notes (Signed)
IV's and tele monitor d/c at this time; pt and wife given d/c instructions, prescriptions, and follow up appointment information; both verbalized understanding; pt to d/c home with wife; will cont. To monitor.

## 2014-02-03 ENCOUNTER — Telehealth: Payer: Self-pay | Admitting: *Deleted

## 2014-02-03 NOTE — Telephone Encounter (Signed)
Faxed orders for cardiac rehab - phase 2 - no GXT required

## 2014-02-06 ENCOUNTER — Telehealth (HOSPITAL_COMMUNITY): Payer: Self-pay | Admitting: *Deleted

## 2014-02-06 ENCOUNTER — Ambulatory Visit (INDEPENDENT_AMBULATORY_CARE_PROVIDER_SITE_OTHER): Payer: Self-pay | Admitting: Physician Assistant

## 2014-02-06 VITALS — BP 93/59 | HR 82 | Temp 99.3°F | Resp 16 | Ht 68.0 in | Wt 195.0 lb

## 2014-02-06 DIAGNOSIS — Z951 Presence of aortocoronary bypass graft: Secondary | ICD-10-CM

## 2014-02-06 DIAGNOSIS — M1000 Idiopathic gout, unspecified site: Secondary | ICD-10-CM

## 2014-02-06 MED ORDER — COLCHICINE 0.6 MG PO TABS: 0.6000 mg | ORAL_TABLET | Freq: Every day | ORAL | Status: DC

## 2014-02-06 NOTE — Telephone Encounter (Signed)
Wife called wanting to know if pt still needs to have his echo on the 11th. He just had a procedure done and she thinks he may not need it.

## 2014-02-06 NOTE — Telephone Encounter (Signed)
Patient had echo in hospital. Should not need outpatient echo.

## 2014-02-06 NOTE — Progress Notes (Signed)
[address]       [place] [address]             [phone]          HPI: The patient is status post CABG by Dr. [name] on 01/27/2014. His postoperative course was generally uneventful, although he was volume overloaded and started on Lasix.  He progressed well and was able to be discharged home on 12/2 in good condition.    He did well at home the first day, but has developed right knee pain which has worsened to the point that he has difficulty bearing weight.  The patient has a history of previous right knee surgery, and has also had a history of gout in the knee as well as the ankle.  This has been treated in the past with steroids,although he states that he did not seek medical attention for the last flareup.  He feels that the pain is similar in nature to his previous gout flare.  He called our office today requesting an early follow up for evaluation.  He has been stable otherwise.  His appetite is marginal, but better after having a BM yesterday. He has been able to ambulate short distances up until today.   He denies chest pain, shortness of breath, calf pain, fevers or chills.      Current Outpatient Prescriptions  Medication Sig Dispense Refill  . aspirin EC 325 MG EC tablet Take 1 tablet (325 mg total) by mouth daily.    Marland Kitchen atorvastatin (LIPITOR) 80 MG tablet Take 1 tablet (80 mg total) by mouth daily at 6 PM. 30 tablet 1  . carvedilol (COREG) 3.125 MG tablet Take 1 tablet (3.125 mg total) by mouth 2 (two) times daily with a meal. 180 tablet 1  . Cholecalciferol (VITAMIN D-3) 5000 UNITS TABS Take 1 tablet by mouth daily.     . furosemide (LASIX) 40 MG tablet Take 1 tablet (40 mg total) by mouth daily. 30 tablet 0  . L-Tryptophan 500 MG CAPS Take 1,000 mg by mouth daily.    Marland Kitchen lisinopril (PRINIVIL,ZESTRIL) 20 MG tablet Take 1 tablet (20 mg total) by mouth daily. 30 tablet 1  . Melatonin 5 MG TABS Take by mouth at bedtime.    . Multiple Vitamin  (MULTIVITAMIN) capsule Take 1 capsule by mouth daily.    . Nutritional Supplements (GRAPESEED EXTRACT PO) Take by mouth daily. Resveratrol    . Omega-3 Fatty Acids (OMEGA 3 PO) Take 1,280 mg by mouth daily.    . potassium chloride SA (K-DUR,KLOR-CON) 20 MEQ tablet Take 1 tablet (20 mEq total) by mouth daily. 30 tablet 0  . traMADol (ULTRAM) 50 MG tablet Take 1-2 tablets (50-100 mg total) by mouth every 6 (six) hours as needed for moderate pain. 50 tablet 0  . Valerian 500 MG CAPS Take 2 capsules by mouth daily.    . colchicine 0.6 MG tablet Take 1 tablet (0.6 mg total) by mouth daily. X 1 week 14 tablet 0  . oxyCODONE (OXY IR/ROXICODONE) 5 MG immediate release tablet Take 1-2 tablets (5-10 mg total) by mouth every 4 (four) hours as needed for severe pain. (Patient not taking: Reported on 02/06/2014) 50 tablet 0   No current facility-administered medications for this visit.     Physical Exam: BP 93/59 HR 82 Resp 16 Temperature 99.3 Wounds: Sternal and right leg incisions are healing well.  Chest tube sutures are still in place and I  removed them today. Heart: Regular rate and rhythm Lungs: Clear Extremities: There is generalized swelling of the right knee with warmth and tenderness to palpation of the joint and with motion. He has mild bilateral lower extremity edema.  No calf tenderness.   Diagnostic Tests: Chest xray: No results found.     Assessment/Plan: The patient's symptoms seem to be confined to the right knee joint, which is warm and tender.  He does have bilateral lower extremity edema which is being treated with Lasix. I feel his symptoms are consistent with gout, as he has no calf pain or other symptoms of DVT at present.  I have started him on Colchicine x 7 days and continued the Lasix.  He is instructed to call if he develops worsening edema, calf pain or tenderness, shortness of breath, fever or other new or changing symptoms.  We will schedule him back for follow up, and  he knows to call in the interim if he needs to be seen sooner.

## 2014-02-08 ENCOUNTER — Other Ambulatory Visit: Payer: Self-pay | Admitting: *Deleted

## 2014-02-08 MED ORDER — CARVEDILOL 3.125 MG PO TABS: 3.1250 mg | ORAL_TABLET | Freq: Two times a day (BID) | ORAL | Status: DC

## 2014-02-08 NOTE — Telephone Encounter (Signed)
Rx was sent to pharmacy electronically. 

## 2014-02-09 ENCOUNTER — Encounter (HOSPITAL_COMMUNITY): Payer: Self-pay | Admitting: Cardiovascular Disease

## 2014-02-10 ENCOUNTER — Ambulatory Visit (HOSPITAL_COMMUNITY): Payer: Medicare Other

## 2014-02-10 ENCOUNTER — Other Ambulatory Visit: Payer: Self-pay | Admitting: *Deleted

## 2014-02-10 MED ORDER — FUROSEMIDE 40 MG PO TABS: 40.0000 mg | ORAL_TABLET | Freq: Every day | ORAL | Status: DC

## 2014-02-12 ENCOUNTER — Other Ambulatory Visit: Payer: Self-pay | Admitting: Surgical

## 2014-02-13 ENCOUNTER — Other Ambulatory Visit: Payer: Self-pay | Admitting: *Deleted

## 2014-02-13 DIAGNOSIS — G8918 Other acute postprocedural pain: Secondary | ICD-10-CM

## 2014-02-13 MED ORDER — TRAMADOL HCL 50 MG PO TABS: 50.0000 mg | ORAL_TABLET | Freq: Four times a day (QID) | ORAL | Status: DC | PRN

## 2014-02-13 NOTE — Telephone Encounter (Signed)
Mrs. Stanco has called for a pain med refill for her husband.  I told her that I would fax a new Tramadol script to their pharmacy and she agreed.  Upon further reading, Jadene Pierini, P.A. Authorized a refill yesterday and I informed Mr. Malkin.

## 2014-02-17 ENCOUNTER — Other Ambulatory Visit: Payer: Self-pay | Admitting: *Deleted

## 2014-02-17 ENCOUNTER — Ambulatory Visit (INDEPENDENT_AMBULATORY_CARE_PROVIDER_SITE_OTHER): Payer: Medicare Other | Admitting: Internal Medicine

## 2014-02-17 ENCOUNTER — Encounter: Payer: Self-pay | Admitting: Internal Medicine

## 2014-02-17 VITALS — BP 110/74 | HR 78 | Ht 68.0 in | Wt 194.1 lb

## 2014-02-17 DIAGNOSIS — M109 Gout, unspecified: Secondary | ICD-10-CM | POA: Insufficient documentation

## 2014-02-17 DIAGNOSIS — Z951 Presence of aortocoronary bypass graft: Secondary | ICD-10-CM

## 2014-02-17 DIAGNOSIS — I10 Essential (primary) hypertension: Secondary | ICD-10-CM | POA: Diagnosis not present

## 2014-02-17 DIAGNOSIS — I110 Hypertensive heart disease with heart failure: Secondary | ICD-10-CM | POA: Diagnosis not present

## 2014-02-17 DIAGNOSIS — I5041 Acute combined systolic (congestive) and diastolic (congestive) heart failure: Secondary | ICD-10-CM

## 2014-02-17 DIAGNOSIS — Z79899 Other long term (current) drug therapy: Secondary | ICD-10-CM

## 2014-02-17 DIAGNOSIS — F32A Depression, unspecified: Secondary | ICD-10-CM

## 2014-02-17 DIAGNOSIS — I214 Non-ST elevation (NSTEMI) myocardial infarction: Secondary | ICD-10-CM

## 2014-02-17 DIAGNOSIS — M1000 Idiopathic gout, unspecified site: Secondary | ICD-10-CM

## 2014-02-17 DIAGNOSIS — F329 Major depressive disorder, single episode, unspecified: Secondary | ICD-10-CM | POA: Insufficient documentation

## 2014-02-17 DIAGNOSIS — I2510 Atherosclerotic heart disease of native coronary artery without angina pectoris: Secondary | ICD-10-CM | POA: Diagnosis not present

## 2014-02-17 LAB — COMPREHENSIVE METABOLIC PANEL
ALK PHOS: 92 U/L (ref 39–117)
ALT: 19 U/L (ref 0–53)
AST: 18 U/L (ref 0–37)
Albumin: 3.7 g/dL (ref 3.5–5.2)
BILIRUBIN TOTAL: 0.5 mg/dL (ref 0.2–1.2)
BUN: 19 mg/dL (ref 6–23)
CO2: 28 meq/L (ref 19–32)
Calcium: 10 mg/dL (ref 8.4–10.5)
Chloride: 100 mEq/L (ref 96–112)
Creat: 1.17 mg/dL (ref 0.50–1.35)
Glucose, Bld: 110 mg/dL — ABNORMAL HIGH (ref 70–99)
Potassium: 5.1 mEq/L (ref 3.5–5.3)
Sodium: 139 mEq/L (ref 135–145)
Total Protein: 7.2 g/dL (ref 6.0–8.3)

## 2014-02-17 LAB — URIC ACID: Uric Acid, Serum: 7.3 mg/dL (ref 4.0–7.8)

## 2014-02-17 MED ORDER — ALLOPURINOL 100 MG PO TABS: 100.0000 mg | ORAL_TABLET | Freq: Every day | ORAL | Status: DC

## 2014-02-17 MED ORDER — SERTRALINE HCL 50 MG PO TABS: 50.0000 mg | ORAL_TABLET | Freq: Every day | ORAL | Status: DC

## 2014-02-17 NOTE — Progress Notes (Signed)
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 Unfortunately he has an aversion to physicians and has not seen a doctor in about 40 years. He recently presented to urgent care at [facility] for progressive cough and was noted to be markedly hypertensive on presentation with a blood pressure of 205/112. Laboratory work revealed an elevated BNP of 1971. Chest x-ray demonstrated cardiomegaly without overt congestion. He denied any shortness of breath or worsening chest pain. He had been on aspirin for a long time for prophylaxis. He was recommended he start on Coreg 3.125 mg twice daily, enalapril 5 mg daily and furosemide 20 mg daily. He reports over the next couple of days a marked improvement in his cough and the fact that he lost about 3 pounds. Blood pressure is notably improved today at 130/82. EKG in the office demonstrates normal sinus rhythm with LVH by voltage at a rate of 93. There is no significant history of hypertension or heart disease in the family. Both parents had lung cancer and died of that.  Derek [name] returns today for follow-up. Unfortunately before we could perform any of the tests we've ordered he presented with chest pain the setting of steroid use for gout. This ultimately led to non-ST elevation MI and he underwent heart catheterization. Unfortunately this demonstrated multivessel coronary disease as follows:  Coronary angiography: Coronary dominance: right  Left mainstem: The left main is patent with mild 20% distal left main stenosis extending into the LAD/left circumflex bifurcation.  Left anterior descending (LAD): The LAD has 50% ostial stenosis. The vessel is diffusely diseased. There is an ulcerated area in the proximal vessel which may represent an occluded diagonal branch. There is diffuse 90% stenosis in the LAD just  after the first perforator. The mid LAD has diffuse irregularity without high-grade obstruction. The distal LAD is patent also with diffuse irregularity. The first visualized diagonal has 80-90% stenosis, but it is a tiny vessel (less than 1 mm). The second diagonal has moderate diffuse 70% stenosis. It is also relatively small vessel of approximately 1-1.5 mm.  Left circumflex (LCx): The left circumflex is critically diseased. The vessel has 95% proximal stenosis extending back to the ostium. The mid vessel has 80% stenosis. There is diffuse calcification present. There are 2 obtuse marginal branches without significant disease.  Right coronary artery (RCA): 100% occlusion of the proximal vessel. There is left to right collateral filling the PDA branch, distal RCA, and mid RCA.  Left ventriculography: There is global and segmental LV systolic dysfunction. There is mild diffuse hypokinesis of the anterolateral and apical segments. The inferior wall is akinetic from the base to the mid ventricle. The estimated LVEF is 35%.   Estimated Blood Loss: Minimal  Final Conclusions:  1. Severe three-vessel coronary artery disease with total occlusion of the right coronary artery and left-to-right collaterals, severe stenosis of the LAD, and severe diffuse stenosis of the left circumflex 2. Moderately severe segmental LV systolic dysfunction  He was then referred to for coronary artery artery bypass grafting. He underwent CABG 4 by Dr. [name] with Left internal mammary graft to the LAD, SVG to diagonal, SVG to OM, and SVG to PDA.  Postoperatively he developed another recurrence of gout in the right knee and was treated with colchicine and this is resolved. He has never had uric acid  evaluation. At this point his wife's main concern is that he is acting somewhat depressed, he is somewhat withdrawn, tearful and not as engaged in certain activities. She is concerned about postsurgical depression. He has not  returned to work and was a very active prior to surgery. He is interested in cardiac rehabilitation.  PMHx:  Past Medical History  Diagnosis Date  . Hypertension   . Gout   . NSTEMI (non-ST elevated myocardial infarction)     Past Surgical History  Procedure Laterality Date  . Knee surgery    . Coronary artery bypass graft N/A 01/27/2014    Procedure: CORONARY ARTERY BYPASS GRAFTING (CABG), ON PUMP, TIMES FOUR, USING LEFT INTERNAL MAMMARY ARTERY, RIGHT GREATER SAPHENOUS VEIN HARVESTED ENDOSCOPICALLY;  Surgeon: Gaye Pollack, MD;  Location: [facility];  Service: Open Heart Surgery;  Laterality: N/A;  LIMA-LAD; SVG-OM; SVG-PD; SVG-DIAG  . Tee without cardioversion N/A 01/27/2014    Procedure: TRANSESOPHAGEAL ECHOCARDIOGRAM (TEE);  Surgeon: Gaye Pollack, MD;  Location: [facility];  Service: Open Heart Surgery;  Laterality: N/A;  . Left heart catheterization with coronary angiogram N/A 01/23/2014    Procedure: LEFT HEART CATHETERIZATION WITH CORONARY ANGIOGRAM;  Surgeon: Blane Ohara, MD;  Location: [facility] CATH LAB;  Service: Cardiovascular;  Laterality: N/A;    FAMHx:  Family History  Problem Relation Age of Onset  . Cancer Mother   . Cancer Father     SOCHx:   reports that he has never smoked. He has never used smokeless tobacco. He reports that he drinks alcohol. He reports that he does not use illicit drugs.  ALLERGIES:  Allergies  Allergen Reactions  . Penicillins Anaphylaxis    ROS: A comprehensive review of systems was negative except for: Behavioral/Psych: positive for depression  HOME MEDS: Current Outpatient Prescriptions  Medication Sig Dispense Refill  . aspirin EC 325 MG EC tablet Take 1 tablet (325 mg total) by mouth daily.    Marland Kitchen atorvastatin (LIPITOR) 80 MG tablet Take 1 tablet (80 mg total) by mouth daily at 6 PM. 30 tablet 1  . bisacodyl (DULCOLAX) 5 MG EC tablet Take 5 mg by mouth daily as needed for moderate constipation.    . carvedilol (COREG) 3.125 MG tablet  Take 1 tablet (3.125 mg total) by mouth 2 (two) times daily with a meal. 60 tablet 6  . Cholecalciferol (VITAMIN D-3) 5000 UNITS TABS Take 1 tablet by mouth daily.     Marland Kitchen docusate sodium (STOOL SOFTENER) 100 MG capsule Take 100 mg by mouth daily.    . furosemide (LASIX) 40 MG tablet Take 1 tablet (40 mg total) by mouth daily. 30 tablet 6  . lisinopril (PRINIVIL,ZESTRIL) 20 MG tablet Take 1 tablet (20 mg total) by mouth daily. 30 tablet 1  . Multiple Vitamin (MULTIVITAMIN) capsule Take 1 capsule by mouth daily.    . Nutritional Supplements (GRAPESEED EXTRACT PO) Take by mouth daily. Resveratrol    . Omega-3 Fatty Acids (OMEGA 3 PO) Take 1,280 mg by mouth daily.    Marland Kitchen oxyCODONE (OXY IR/ROXICODONE) 5 MG immediate release tablet Take 1-2 tablets (5-10 mg total) by mouth every 4 (four) hours as needed for severe pain. 50 tablet 0  . polyethylene glycol (MIRALAX / GLYCOLAX) packet Take 17 g by mouth as needed.    . potassium chloride SA (K-DUR,KLOR-CON) 20 MEQ tablet Take 1 tablet (20 mEq total) by mouth daily. 30 tablet 0  . traMADol (ULTRAM) 50 MG tablet Take 1-2 tablets (50-100 mg total) by  mouth every 6 (six) hours as needed for moderate pain. 50 tablet 0  . Turmeric 500 MG CAPS Take 1 capsule by mouth daily.    Marland Kitchen UBIQUINOL PO Take by mouth daily.    . sertraline (ZOLOFT) 50 MG tablet Take 1 tablet (50 mg total) by mouth daily. 30 tablet 6   No current facility-administered medications for this visit.    LABS/IMAGING: No results found for this or any previous visit (from the past 48 hour(s)). No results found.  VITALS: BP 110/74 mmHg  Pulse 78  Ht 5\' 8"  (1.727 m)  Wt 194 lb 1.6 oz (88.043 kg)  BMI 29.52 kg/m2  EXAM: General appearance: alert and no distress Neck: JVD - 3 cm above sternal notch, no carotid bruit and thyroid not enlarged, symmetric, no tenderness/mass/nodules Lungs: clear to auscultation bilaterally and Well-healing midline sternotomy scar Heart: regular rate and rhythm,  S1, S2 normal and no S3 or S4 Abdomen: soft, non-tender; bowel sounds normal; no masses,  no organomegaly Extremities: extremities normal, atraumatic, no cyanosis or edema Pulses: 2+ and symmetric Skin: Skin color, texture, turgor normal. No rashes or lesions Neurologic: Grossly normal Psych: Does appear somewhat more depressed  EKG: Normal sinus rhythm at 78, LVH  ASSESSMENT: 1. Coronary artery disease status post CABG 4 (LIMA to LAD, SVG to OM, SVG to diagonal and SVG to PDA) - 01/2014 2. Ischemic cardiomyopathy-EF 40-45% 3. LVH by voltage 4. Hypertension-controlled 5. Post operative depression 6. Recurrent gout  PLAN: 1.   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 He is recovering slowly. He does appear to be somewhat flattened and his wife indicated that he is depressed and tearful. I believe he benefit from a low-dose antidepressant for at least a short period of time postoperatively. We will start with Zoloft 50 mg daily. He also has had 2 episodes of gout and currently it has resolved. I will check uric acid level today along with metabolic profile. I expect to start him on allopurinol hopefully to get better control of his gout. He should go ahead and start cardiac rehabilitation. 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 Plan to see him back in 6 months.  [name], MD, [facility] Attending Cardiologist [facility]  Burgess Sheriff C 02/17/2014, 1:06 PM

## 2014-02-17 NOTE — Patient Instructions (Addendum)
Your physician recommends that you return for lab work TODAY  You have been referred to cardiac rehab @ [facility] has recommended you make the following change in your medication: START sertraline (zoloft) 50mg  once daily <this has been sent to your pharmacy>  Your physician wants you to follow-up in: 6 months with Dr. [name]. You will receive a reminder letter in the mail two months in advance. If you don't receive a letter, please call our office to schedule the follow-up appointment.

## 2014-02-17 NOTE — Telephone Encounter (Signed)
Provided patient with lab results and med instructions. Patient voiced understanding. Rx was sent to pharmacy electronically. Lab slips mailed to patient for repeat uric acid in 3 months.

## 2014-02-20 ENCOUNTER — Other Ambulatory Visit: Payer: Self-pay

## 2014-02-20 MED ORDER — POTASSIUM CHLORIDE CRYS ER 20 MEQ PO TBCR: 20.0000 meq | EXTENDED_RELEASE_TABLET | Freq: Every day | ORAL | Status: DC

## 2014-02-20 NOTE — Telephone Encounter (Signed)
Rx sent to pharmacy   

## 2014-02-21 ENCOUNTER — Telehealth: Payer: Self-pay | Admitting: Internal Medicine

## 2014-02-21 NOTE — Telephone Encounter (Signed)
Please call,concerning his gout.

## 2014-02-21 NOTE — Telephone Encounter (Signed)
Pt just starting Allopurinol and seems to be causing a gout attack. Was wondering if there was something that could be called in to help or what needed to be done. Ms. Mcquitty asked to be contacted at [phone]. The pharmacy on file is correct.

## 2014-02-22 NOTE — Telephone Encounter (Signed)
Informed pt to hold statin over next few days and to contact us back on Monday 12/28 to give an update. If improvement in symptoms then statin is cause and will advise MD to make new recommendations. Pt stated that his mother had this same pain and discomfort with statins. He feels it in not gout bc the pain is not in his joints. He is having trouble sleeping due to the pain and discomfort. Understands if no improvement then the statin is not the cause and should contact PCP. Pt voiced understanding.

## 2014-02-22 NOTE — Telephone Encounter (Signed)
Have him stop the allopurinol. Rx for colchicine 0.6 mg BID for 1 week, then can decrease to 0.6 mg daily for 1 week. Can consider restarting allopurinol in a few weeks.  Dr. Lemmie Evens

## 2014-02-22 NOTE — Telephone Encounter (Signed)
Spoke to pts wife. She stated that there has been a change since yesterday. She stated that they "know gout" well enough and she feels now that this is not gout. She stated that the pts legs (muscles) are very sore and is having difficulty moving them, but they are not painful to touch and red like when his gout is acting up. Pts wife said she is wondering if the statin drug (Atorvastatin) he was started on for the first time while in the hospital was causing this pain and discomfort. I did not give her the instructions to stop the allopurinol. I didn't if this new information would change the plan of care. Will call her back after hearing from Dr. [name].

## 2014-02-22 NOTE — Telephone Encounter (Signed)
Ok. If they feel it is not acute gout, then have them stop the statin - if not improved tomorrow, should be evaluated by PCP. Hard to make a diagnosis without seeing him.  Dr. Lemmie Evens

## 2014-03-07 ENCOUNTER — Other Ambulatory Visit: Payer: Self-pay | Admitting: Surgery

## 2014-03-07 DIAGNOSIS — Z951 Presence of aortocoronary bypass graft: Secondary | ICD-10-CM

## 2014-03-08 ENCOUNTER — Encounter: Payer: Self-pay | Admitting: Surgery

## 2014-03-08 ENCOUNTER — Ambulatory Visit (INDEPENDENT_AMBULATORY_CARE_PROVIDER_SITE_OTHER): Payer: Self-pay | Admitting: Surgery

## 2014-03-08 ENCOUNTER — Ambulatory Visit
Admission: RE | Admit: 2014-03-08 | Discharge: 2014-03-08 | Disposition: A | Discharge status: Home / Self Care | Payer: Medicare Other | Source: Ambulatory Visit | Attending: Surgery | Admitting: Surgery

## 2014-03-08 VITALS — BP 97/59 | HR 86 | Resp 18 | Ht 68.0 in | Wt 194.0 lb

## 2014-03-08 DIAGNOSIS — J9811 Atelectasis: Secondary | ICD-10-CM | POA: Diagnosis not present

## 2014-03-08 DIAGNOSIS — Z951 Presence of aortocoronary bypass graft: Secondary | ICD-10-CM

## 2014-03-08 DIAGNOSIS — I214 Non-ST elevation (NSTEMI) myocardial infarction: Secondary | ICD-10-CM

## 2014-03-09 ENCOUNTER — Encounter (HOSPITAL_COMMUNITY)
Admission: RE | Admit: 2014-03-09 | Discharge: 2014-03-09 | Disposition: A | Discharge status: Home / Self Care | Payer: Medicare Other | Source: Ambulatory Visit | Attending: Internal Medicine | Admitting: Internal Medicine

## 2014-03-09 DIAGNOSIS — Z5189 Encounter for other specified aftercare: Secondary | ICD-10-CM | POA: Insufficient documentation

## 2014-03-09 DIAGNOSIS — I252 Old myocardial infarction: Secondary | ICD-10-CM | POA: Insufficient documentation

## 2014-03-09 DIAGNOSIS — Z951 Presence of aortocoronary bypass graft: Secondary | ICD-10-CM | POA: Insufficient documentation

## 2014-03-09 NOTE — Progress Notes (Signed)
Cardiac Rehab Medication Review by a Pharmacist  Does the patient  feel that his/her medications are working for him/her?  yes  Has the patient been experiencing any side effects to the medications prescribed?  Yes - reports some fatigue and decreased but not sure if from medications or procedures  Does the patient measure his/her own blood pressure or blood glucose at home?  yes - BP every day  Does the patient have any problems obtaining medications due to transportation or finances?   no  Understanding of regimen: good Understanding of indications: excellent Potential of compliance: excellent    Pharmacist comments: Patient and wife report excellent compliance, occasionally misses nighttime doses but very rarely. No issues reported.  Elicia Lamp, PharmD Clinical Pharmacist - Resident Pager [phone] 03/09/2014 9:00 AM

## 2014-03-11 ENCOUNTER — Encounter: Payer: Self-pay | Admitting: Surgery

## 2014-03-11 NOTE — Progress Notes (Signed)
HPI:  Patient returns for routine postoperative follow-up having undergone CABG x 4 on 01/27/2014. The patient's early postoperative recovery while in the hospital was notable for an episode of gout that responded to colchicine. Since hospital discharge the patient reports that he was started on some Zoloft by Dr. [name] for postop depression. He also developed marked generalized muscle weakness suddenly and says he could not get up to ambulate. He thought it might be the statin which he stopped and the weakness resolved completely. 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   Current Outpatient Prescriptions  Medication Sig Dispense Refill  . allopurinol (ZYLOPRIM) 100 MG tablet Take 1 tablet (100 mg total) by mouth daily. 30 tablet 6  . aspirin EC 325 MG EC tablet Take 1 tablet (325 mg total) by mouth daily.    . bisacodyl (DULCOLAX) 5 MG EC tablet Take 5 mg by mouth every other day.     . carvedilol (COREG) 3.125 MG tablet Take 1 tablet (3.125 mg total) by mouth 2 (two) times daily with a meal. 60 tablet 6  . Cholecalciferol (VITAMIN D-3) 5000 UNITS TABS Take 1 tablet by mouth daily.     Marland Kitchen docusate sodium (STOOL SOFTENER) 100 MG capsule Take 100 mg by mouth daily.    . furosemide (LASIX) 40 MG tablet Take 1 tablet (40 mg total) by mouth daily. (Patient taking differently: Take 20 mg by mouth daily. ) 30 tablet 6  . lisinopril (PRINIVIL,ZESTRIL) 20 MG tablet Take 1 tablet (20 mg total) by mouth daily. 30 tablet 1  . Multiple Vitamin (MULTIVITAMIN) capsule Take 1 capsule by mouth daily.    . Nutritional Supplements (GRAPESEED EXTRACT PO) Take by mouth daily. Resveratrol    . Omega-3 Fatty Acids (OMEGA 3 PO) Take 1,280 mg by mouth daily.    . polyethylene glycol (MIRALAX / GLYCOLAX) packet Take 17 g by mouth as needed.    . potassium chloride SA (K-DUR,KLOR-CON) 20 MEQ tablet Take 1 tablet (20 mEq total) by mouth daily. 30 tablet 9  .  sertraline (ZOLOFT) 50 MG tablet Take 1 tablet (50 mg total) by mouth daily. 30 tablet 6  . traMADol (ULTRAM) 50 MG tablet Take 1-2 tablets (50-100 mg total) by mouth every 6 (six) hours as needed for moderate pain. 50 tablet 0  . Turmeric 500 MG CAPS Take 1 capsule by mouth daily.    Marland Kitchen UBIQUINOL PO Take by mouth daily.     No current facility-administered medications for this visit.    Physical Exam: BP 97/59 mmHg  Pulse 86  Resp 18  Ht 5\' 8"  (1.727 m)  Wt 194 lb (87.998 kg)  BMI 29.50 kg/m2  SpO2 99% He looks well Lungs are clear Cardiac exam shows a regular rate and rhythm with normal heart sounds. The chest incision is healing well and the sternum is stable. The leg incisions are healing well and there is no significant edema.   Diagnostic Tests:  CLINICAL DATA: Heart surgery 01/25/2014.  EXAM: CHEST 2 VIEW  COMPARISON: 01/30/2014.  FINDINGS: Mediastinum and hilar structures normal. Prior CABG. Cardiomegaly with normal pulmonary vascularity. Improving bibasilar atelectasis with near complete resolution. No pleural effusion or pneumothorax. Degenerative changes thoracic spine.  IMPRESSION: 1. Prior CABG. Cardiomegaly, no CHF. 2. Significant improvement of bibasilar atelectasis with mild residual.   Electronically Signed  By: [place]  On: 03/08/2014 11:14   Impression:  Overall I  think he is doing well. It sounds like he had a bad reaction to the statin but that has resolved completely. He says his appetite is also much better since stopping that.  I encouraged him to continue walking. He is planning to participate in cardiac rehab. I told him he could drive his car but should not lift anything heavier than 10 lbs for three months postop.    Plan:  He will continue to follow up with Dr. [name] and will call me if he has any problems with his incisions.

## 2014-03-13 ENCOUNTER — Encounter (HOSPITAL_COMMUNITY)
Admission: RE | Admit: 2014-03-13 | Discharge: 2014-03-13 | Disposition: A | Discharge status: Home / Self Care | Payer: Medicare Other | Source: Ambulatory Visit | Attending: Internal Medicine | Admitting: Internal Medicine

## 2014-03-13 ENCOUNTER — Encounter (HOSPITAL_COMMUNITY): Payer: Self-pay

## 2014-03-13 DIAGNOSIS — Z951 Presence of aortocoronary bypass graft: Secondary | ICD-10-CM | POA: Diagnosis not present

## 2014-03-13 DIAGNOSIS — I252 Old myocardial infarction: Secondary | ICD-10-CM | POA: Diagnosis not present

## 2014-03-13 DIAGNOSIS — Z5189 Encounter for other specified aftercare: Secondary | ICD-10-CM | POA: Diagnosis not present

## 2014-03-13 NOTE — Progress Notes (Signed)
Pt started cardiac rehab today.  Pt tolerated light exercise without difficulty.  VSS, telemetry-NSR.  Asymptomatic.  Med list reconciled.  PHQ-0.  Pt exhibits good coping skills, positive hopeful outlook with supportive family.  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  Pt enjoys reading, playing golf and visiting the [facility] for cardiac rehab are to increase strength, stamina and return to work without difficulty.  Pt oriented to exercise equipment and routine.  Understanding verbalized.

## 2014-03-15 ENCOUNTER — Encounter (HOSPITAL_COMMUNITY)
Admission: RE | Admit: 2014-03-15 | Discharge: 2014-03-15 | Disposition: A | Discharge status: Home / Self Care | Payer: Medicare Other | Source: Ambulatory Visit | Attending: Internal Medicine | Admitting: Internal Medicine

## 2014-03-15 DIAGNOSIS — Z5189 Encounter for other specified aftercare: Secondary | ICD-10-CM | POA: Diagnosis not present

## 2014-03-17 ENCOUNTER — Encounter (HOSPITAL_COMMUNITY)
Admission: RE | Admit: 2014-03-17 | Discharge: 2014-03-17 | Disposition: A | Discharge status: Home / Self Care | Payer: Medicare Other | Source: Ambulatory Visit | Attending: Internal Medicine | Admitting: Internal Medicine

## 2014-03-17 DIAGNOSIS — Z5189 Encounter for other specified aftercare: Secondary | ICD-10-CM | POA: Diagnosis not present

## 2014-03-20 ENCOUNTER — Telehealth: Payer: Self-pay | Admitting: Internal Medicine

## 2014-03-20 NOTE — Telephone Encounter (Signed)
Pt's wife called in stating that [name] is struggling with the Cardiac rehab due to having some knee problems. She would like to know if there are any water rehab programs that he could join that would not be that much stress on his knee. Please call  Thanks

## 2014-03-21 ENCOUNTER — Telehealth (HOSPITAL_COMMUNITY): Payer: Self-pay | Admitting: General Practice

## 2014-03-22 ENCOUNTER — Encounter (HOSPITAL_COMMUNITY): Payer: Medicare Other

## 2014-03-23 NOTE — Telephone Encounter (Signed)
There is no aquatic rehab .Marland Kitchen Could do water exercises on his own if he wishes or ask rehab to use cycle or less weight bearing exercises.   Dr. Lemmie Evens

## 2014-03-23 NOTE — Telephone Encounter (Signed)
Spoke w/ wife, related Dr. Lysbeth Penner recommendations. She states they have attempted the less weight intensive routes and really think the water exercises will be of the most benefit.  She requested if we could furnish a letter from Dr. [name] stating medical necessity for the water exercises (for tax purposes), they would be very appreciative.

## 2014-03-23 NOTE — Telephone Encounter (Signed)
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 She states this has been ongoing, and notes he had remote hx of knee surgery (unspecified) in 1967 r/t softball injury.  Stated orthopedic physician would be an appropriate resource to investigate this issue.  Her main request is if we can advise on aquatic rehab. They are aware we do not have these services and they are joining a local gym that has a pool. She reports that he "can't do the regular rehab anymore, but knows he has to do something".   She wonders if it would benefit them to have a physician OK this, and if there would be a way for insurance to cover an aquatic program of some kind.  I stated I am unsure but would make Dr. [name] aware and see if he had any recommendations.

## 2014-03-23 NOTE — Telephone Encounter (Signed)
LMTCB

## 2014-03-23 NOTE — Telephone Encounter (Signed)
*  I forgot to route this * I apologize

## 2014-03-24 ENCOUNTER — Encounter (HOSPITAL_COMMUNITY): Payer: Medicare Other

## 2014-03-27 ENCOUNTER — Encounter (HOSPITAL_COMMUNITY): Payer: Medicare Other

## 2014-03-29 ENCOUNTER — Encounter (HOSPITAL_COMMUNITY): Payer: Medicare Other

## 2014-03-29 ENCOUNTER — Telehealth (HOSPITAL_COMMUNITY): Payer: Self-pay | Admitting: General Practice

## 2014-03-31 ENCOUNTER — Encounter (HOSPITAL_COMMUNITY): Payer: Medicare Other

## 2014-04-03 ENCOUNTER — Encounter (HOSPITAL_COMMUNITY): Payer: Medicare Other

## 2014-04-05 ENCOUNTER — Telehealth (HOSPITAL_COMMUNITY): Payer: Self-pay | Admitting: Cardiac Rehabilitation

## 2014-04-05 ENCOUNTER — Encounter (HOSPITAL_COMMUNITY): Payer: Medicare Other

## 2014-04-05 NOTE — Telephone Encounter (Signed)
Phone message received from pt wife.  Pt is unable to return to cardiac rehab due to knee pain.  Pt is planning to exercise on his own at local gym with pool.  Dr. Buren Kos made aware.

## 2014-04-07 ENCOUNTER — Encounter (HOSPITAL_COMMUNITY): Payer: Medicare Other

## 2014-04-10 ENCOUNTER — Encounter (HOSPITAL_COMMUNITY): Payer: Medicare Other

## 2014-04-12 ENCOUNTER — Encounter (HOSPITAL_COMMUNITY): Payer: Medicare Other

## 2014-04-14 ENCOUNTER — Encounter (HOSPITAL_COMMUNITY): Payer: Medicare Other

## 2014-04-17 ENCOUNTER — Encounter (HOSPITAL_COMMUNITY): Payer: Medicare Other

## 2014-04-19 ENCOUNTER — Encounter (HOSPITAL_COMMUNITY): Payer: Medicare Other

## 2014-04-21 ENCOUNTER — Encounter (HOSPITAL_COMMUNITY): Payer: Medicare Other

## 2014-04-24 ENCOUNTER — Encounter (HOSPITAL_COMMUNITY): Payer: Medicare Other

## 2014-04-26 ENCOUNTER — Encounter (HOSPITAL_COMMUNITY): Payer: Medicare Other

## 2014-04-28 ENCOUNTER — Encounter (HOSPITAL_COMMUNITY): Payer: Medicare Other

## 2014-05-01 ENCOUNTER — Encounter (HOSPITAL_COMMUNITY): Payer: Medicare Other

## 2014-05-03 ENCOUNTER — Encounter (HOSPITAL_COMMUNITY): Payer: Medicare Other

## 2014-05-05 ENCOUNTER — Encounter (HOSPITAL_COMMUNITY): Payer: Medicare Other

## 2014-05-08 ENCOUNTER — Encounter (HOSPITAL_COMMUNITY): Payer: Medicare Other

## 2014-05-10 ENCOUNTER — Encounter (HOSPITAL_COMMUNITY): Payer: Medicare Other

## 2014-05-12 ENCOUNTER — Encounter (HOSPITAL_COMMUNITY): Payer: Medicare Other

## 2014-05-15 ENCOUNTER — Encounter (HOSPITAL_COMMUNITY): Payer: Medicare Other

## 2014-05-17 ENCOUNTER — Encounter (HOSPITAL_COMMUNITY): Payer: Medicare Other

## 2014-05-19 ENCOUNTER — Encounter (HOSPITAL_COMMUNITY): Payer: Medicare Other

## 2014-05-21 ENCOUNTER — Other Ambulatory Visit: Payer: Self-pay | Admitting: Surgical

## 2014-05-22 ENCOUNTER — Encounter (HOSPITAL_COMMUNITY): Payer: Medicare Other

## 2014-05-22 ENCOUNTER — Other Ambulatory Visit: Payer: Self-pay | Admitting: *Deleted

## 2014-05-22 MED ORDER — LISINOPRIL 20 MG PO TABS: 20.0000 mg | ORAL_TABLET | Freq: Every day | ORAL | Status: DC

## 2014-05-24 ENCOUNTER — Encounter (HOSPITAL_COMMUNITY): Payer: Medicare Other

## 2014-05-26 ENCOUNTER — Encounter (HOSPITAL_COMMUNITY): Payer: Medicare Other

## 2014-05-29 ENCOUNTER — Encounter (HOSPITAL_COMMUNITY): Payer: Medicare Other

## 2014-05-31 ENCOUNTER — Encounter (HOSPITAL_COMMUNITY): Payer: Medicare Other

## 2014-06-02 ENCOUNTER — Encounter (HOSPITAL_COMMUNITY): Payer: Medicare Other

## 2014-06-05 ENCOUNTER — Encounter (HOSPITAL_COMMUNITY): Payer: Medicare Other

## 2014-06-07 ENCOUNTER — Encounter (HOSPITAL_COMMUNITY): Payer: Medicare Other

## 2014-06-09 ENCOUNTER — Encounter (HOSPITAL_COMMUNITY): Payer: Medicare Other

## 2014-06-12 ENCOUNTER — Encounter (HOSPITAL_COMMUNITY): Payer: Medicare Other

## 2014-06-14 ENCOUNTER — Encounter (HOSPITAL_COMMUNITY): Payer: Medicare Other

## 2014-06-16 ENCOUNTER — Encounter (HOSPITAL_COMMUNITY): Payer: Medicare Other

## 2014-08-04 ENCOUNTER — Encounter: Payer: Self-pay | Admitting: Internal Medicine

## 2014-08-04 ENCOUNTER — Ambulatory Visit (INDEPENDENT_AMBULATORY_CARE_PROVIDER_SITE_OTHER): Payer: Medicare Other | Admitting: Internal Medicine

## 2014-08-04 VITALS — BP 122/88 | HR 79 | Ht 68.0 in | Wt 197.3 lb

## 2014-08-04 DIAGNOSIS — I214 Non-ST elevation (NSTEMI) myocardial infarction: Secondary | ICD-10-CM

## 2014-08-04 DIAGNOSIS — Z79899 Other long term (current) drug therapy: Secondary | ICD-10-CM

## 2014-08-04 DIAGNOSIS — E785 Hyperlipidemia, unspecified: Secondary | ICD-10-CM | POA: Diagnosis not present

## 2014-08-04 DIAGNOSIS — Z951 Presence of aortocoronary bypass graft: Secondary | ICD-10-CM

## 2014-08-04 DIAGNOSIS — I5022 Chronic systolic (congestive) heart failure: Secondary | ICD-10-CM

## 2014-08-04 DIAGNOSIS — I10 Essential (primary) hypertension: Secondary | ICD-10-CM

## 2014-08-04 LAB — COMPREHENSIVE METABOLIC PANEL
ALK PHOS: 61 U/L (ref 39–117)
ALT: 12 U/L (ref 0–53)
AST: 13 U/L (ref 0–37)
Albumin: 3.8 g/dL (ref 3.5–5.2)
BILIRUBIN TOTAL: 0.7 mg/dL (ref 0.2–1.2)
BUN: 16 mg/dL (ref 6–23)
CO2: 28 meq/L (ref 19–32)
Calcium: 9.3 mg/dL (ref 8.4–10.5)
Chloride: 101 mEq/L (ref 96–112)
Creat: 0.79 mg/dL (ref 0.50–1.35)
Glucose, Bld: 103 mg/dL — ABNORMAL HIGH (ref 70–99)
POTASSIUM: 4.8 meq/L (ref 3.5–5.3)
Sodium: 138 mEq/L (ref 135–145)
Total Protein: 7.1 g/dL (ref 6.0–8.3)

## 2014-08-04 LAB — CK: Total CK: 89 U/L (ref 7–232)

## 2014-08-04 MED ORDER — LISINOPRIL 10 MG PO TABS: 10.0000 mg | ORAL_TABLET | Freq: Two times a day (BID) | ORAL | Status: DC

## 2014-08-04 NOTE — Patient Instructions (Signed)
Medication Instructions:   DECREASE aspirin from 325mg  to 81mg  once daily  CHANGE lisinopril from 20mg  once daily to 10mg  twice daily (a new prescription has been sent to your pharmacy)  Labwork:  Fasting Labwork - NMR-lipoprofile, CMP, CK  Testing/Procedures:  N/A  Follow-Up:  6 months with Dr. [name]

## 2014-08-04 NOTE — Progress Notes (Signed)
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 Unfortunately he has an aversion to physicians and has not seen a doctor in about 40 years. He recently presented to urgent care at [facility] [place] for progressive cough and was noted to be markedly hypertensive on presentation with a blood pressure of 205/112. Laboratory work revealed an elevated BNP of 1971. Chest x-ray demonstrated cardiomegaly without overt congestion. He denied any shortness of breath or worsening chest pain. He had been on aspirin for a long time for prophylaxis. He was recommended he start on Coreg 3.125 mg twice daily, enalapril 5 mg daily and furosemide 20 mg daily. He reports over the next couple of days a marked improvement in his cough and the fact that he lost about 3 pounds. Blood pressure is notably improved today at 130/82. EKG in the office demonstrates normal sinus rhythm with LVH by voltage at a rate of 93. There is no significant history of hypertension or heart disease in the family. Both parents had lung cancer and died of that.  Derek [name] returns today for follow-up. Unfortunately before we could perform any of the tests we've ordered he presented with chest pain the setting of steroid use for gout. This ultimately led to non-ST elevation MI and he underwent heart catheterization. Unfortunately this demonstrated multivessel coronary disease as follows:  Coronary angiography: Coronary dominance: right  Left mainstem: The left main is patent with mild 20% distal left main stenosis extending into the LAD/left circumflex bifurcation.  Left anterior descending (LAD): The LAD has 50% ostial stenosis. The vessel is diffusely diseased. There is an ulcerated area in the proximal vessel which may represent an occluded diagonal branch. There is diffuse 90% stenosis in the LAD just after the  first perforator. The mid LAD has diffuse irregularity without high-grade obstruction. The distal LAD is patent also with diffuse irregularity. The first visualized diagonal has 80-90% stenosis, but it is a tiny vessel (less than 1 mm). The second diagonal has moderate diffuse 70% stenosis. It is also relatively small vessel of approximately 1-1.5 mm.  Left circumflex (LCx): The left circumflex is critically diseased. The vessel has 95% proximal stenosis extending back to the ostium. The mid vessel has 80% stenosis. There is diffuse calcification present. There are 2 obtuse marginal branches without significant disease.  Right coronary artery (RCA): 100% occlusion of the proximal vessel. There is left to right collateral filling the PDA branch, distal RCA, and mid RCA.  Left ventriculography: There is global and segmental LV systolic dysfunction. There is mild diffuse hypokinesis of the anterolateral and apical segments. The inferior wall is akinetic from the base to the mid ventricle. The estimated LVEF is 35%.   Estimated Blood Loss: Minimal  Final Conclusions:  1. Severe three-vessel coronary artery disease with total occlusion of the right coronary artery and left-to-right collaterals, severe stenosis of the LAD, and severe diffuse stenosis of the left circumflex 2. Moderately severe segmental LV systolic dysfunction  He was then referred to for coronary artery artery bypass grafting. He underwent CABG 4 by Dr. [name] with Left internal mammary graft to the LAD, SVG to diagonal, SVG to OM, and SVG to PDA.  Postoperatively he developed another recurrence of gout in the right knee and was treated with colchicine and this is resolved. He has never had uric acid evaluation. At  this point his wife's main concern is that he is acting somewhat depressed, he is somewhat withdrawn, tearful and not as engaged in certain activities. She is concerned about postsurgical depression. He has not returned to work  and was a very active prior to surgery. He is interested in cardiac rehabilitation.  At the pleasure seeing Mr. Monje back in the office today. In the interim he seen Dr. [name] and had some changes in his medications. His last office visit I recommended starting him on Zoloft, however he felt like he was in a fog on that medication. Subsequently discontinued it and then ultimately he reported that getting back to work actually help with his depression. He seems quite bright and in good spirits today. His wife notes a marked improvement. Secondly, he has been taken off of his Lipitor. He had severe myalgias and myopathy with this medication. In fact he developed dark urine which sounds like myoglobin. He was so debilitated that the family almost had to get a wheelchair for him to get around. He is not interested in taking any further statins because of this. His symptoms did recover after a few days off of his medicine.  PMHx:  Past Medical History  Diagnosis Date  . Hypertension   . Gout   . NSTEMI (non-ST elevated myocardial infarction)     Past Surgical History  Procedure Laterality Date  . Knee surgery    . Coronary artery bypass graft N/A 01/27/2014    Procedure: CORONARY ARTERY BYPASS GRAFTING (CABG), ON PUMP, TIMES FOUR, USING LEFT INTERNAL MAMMARY ARTERY, RIGHT GREATER SAPHENOUS VEIN HARVESTED ENDOSCOPICALLY;  Surgeon: Gaye Pollack, MD;  Location: [facility];  Service: Open Heart Surgery;  Laterality: N/A;  LIMA-LAD; SVG-OM; SVG-PD; SVG-DIAG  . Tee without cardioversion N/A 01/27/2014    Procedure: TRANSESOPHAGEAL ECHOCARDIOGRAM (TEE);  Surgeon: Gaye Pollack, MD;  Location: [facility];  Service: Open Heart Surgery;  Laterality: N/A;  . Left heart catheterization with coronary angiogram N/A 01/23/2014    Procedure: LEFT HEART CATHETERIZATION WITH CORONARY ANGIOGRAM;  Surgeon: Blane Ohara, MD;  Location: [facility] CATH LAB;  Service: Cardiovascular;  Laterality: N/A;    FAMHx:  Family History    Problem Relation Age of Onset  . Cancer Mother   . Cancer Father     SOCHx:   reports that he has never smoked. He has never used smokeless tobacco. He reports that he drinks alcohol. He reports that he does not use illicit drugs.  ALLERGIES:  Allergies  Allergen Reactions  . Lipitor [Atorvastatin]     Myalgias...severe  . Penicillins Anaphylaxis  . Prednisone Anxiety    Extremely high blood pressure after taking medication for first time.    ROS: A comprehensive review of systems was negative.  HOME MEDS: Current Outpatient Prescriptions  Medication Sig Dispense Refill  . aspirin EC 81 MG tablet Take 81 mg by mouth daily.    . carvedilol (COREG) 3.125 MG tablet Take 1 tablet (3.125 mg total) by mouth 2 (two) times daily with a meal. 60 tablet 6  . Cholecalciferol (VITAMIN D-3) 5000 UNITS TABS Take 1 tablet by mouth daily.     . furosemide (LASIX) 20 MG tablet Take 1 tablet by mouth daily as needed.   1  . lisinopril (PRINIVIL,ZESTRIL) 10 MG tablet Take 1 tablet (10 mg total) by mouth 2 (two) times daily. 180 tablet 1  . Multiple Vitamin (MULTIVITAMIN) capsule Take 1 capsule by mouth daily.    . potassium chloride  SA (K-DUR,KLOR-CON) 20 MEQ tablet Take 1 tablet (20 mEq total) by mouth daily. (Patient taking differently: Take 20 mEq by mouth daily. Pt takes 1/2 tab BID) 30 tablet 9  . Turmeric 500 MG CAPS Take 1 capsule by mouth daily.     No current facility-administered medications for this visit.    LABS/IMAGING: No results found for this or any previous visit (from the past 48 hour(s)). No results found.  VITALS: BP 122/88 mmHg  Pulse 79  Ht 5\' 8"  (1.727 m)  Wt 197 lb 4.8 oz (89.495 kg)  BMI 30.01 kg/m2  EXAM: General appearance: alert and no distress Neck: no carotid bruit and no JVD Lungs: clear to auscultation bilaterally Heart: regular rate and rhythm, S1, S2 normal and no S3 or S4 Abdomen: soft, non-tender; bowel sounds normal; no masses,  no  organomegaly Extremities: extremities normal, atraumatic, no cyanosis or edema Pulses: 2+ and symmetric Skin: Skin color, texture, turgor normal. No rashes or lesions Neurologic: Grossly normal Psych: Pleasant mood, normal affect  EKG: Normal sinus rhythm at 79  ASSESSMENT: 1. Coronary artery disease status post CABG 4 (LIMA to LAD, SVG to OM, SVG to diagonal and SVG to PDA) - 01/2014 2. Ischemic cardiomyopathy-EF 40-45% 3. LVH by voltage 4. Hypertension-controlled 5. Post operative depression - resolved 6. Recurrent gout 7. Dyslipidemia-severe myopathy with Lipitor  PLAN: 1.   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 He is recovering nicely. He is back to work and his mood is improved significantly. Unfortunately had severe myopathy with Lipitor. I think we need to avoid statins because of this problem. He does need aggressive cholesterol treatment. I like to recheck his cholesterol and if it remains elevated would consider putting him on ezetimibe. Blood pressure is well-controlled. He occasionally has some low blood pressures and I recommended raking his lisinopril into 10 mg in taking it every 12 hours. He also wishes to come off of the Lasix. He does have some cardiomyopathy however EF may have improved. I think he can go ahead and take the Lasix as needed but if he gains 2-3 pounds over a couple of days or has more swelling he will need to go back on the Lasix. He will need a repeat echocardiogram probably at his next visit. He's also had some nosebleeds. I think this is related to sinus congestion and allergies. I do believe he could decrease his aspirin back to 81 mg daily.  [name], MD, [facility] Attending Cardiologist [place] 08/04/2014, 1:42 PM

## 2014-08-07 ENCOUNTER — Telehealth: Payer: Self-pay | Admitting: *Deleted

## 2014-08-07 DIAGNOSIS — E785 Hyperlipidemia, unspecified: Secondary | ICD-10-CM

## 2014-08-07 LAB — NMR LIPOPROFILE WITH LIPIDS
CHOLESTEROL, TOTAL: 212 mg/dL — AB (ref 100–199)
HDL PARTICLE NUMBER: 20 umol/L — AB (ref >=30.5)
HDL Size: 8.9 nm — ABNORMAL LOW (ref >=9.2)
HDL-C: 38 mg/dL — ABNORMAL LOW (ref >39)
LARGE VLDL-P: 2.1 nmol/L (ref <=2.7)
LDL (calc): 153 mg/dL — ABNORMAL HIGH (ref 0–99)
LDL PARTICLE NUMBER: 1921 nmol/L — AB (ref <1000)
LDL SIZE: 20.5 nm (ref >=20.8)
LP-IR Score: 46 — ABNORMAL HIGH (ref <=45)
Large HDL-P: 1.6 umol/L — ABNORMAL LOW (ref >=4.8)
Small LDL Particle Number: 825 nmol/L — ABNORMAL HIGH (ref <=527)
TRIGLYCERIDES: 107 mg/dL (ref 0–149)
VLDL SIZE: 37.3 nm (ref <=46.6)

## 2014-08-07 MED ORDER — EZETIMIBE 10 MG PO TABS: 10.0000 mg | ORAL_TABLET | Freq: Every day | ORAL | Status: DC

## 2014-08-07 NOTE — Telephone Encounter (Signed)
Patient notified of lab results - zetia and repeat NMR ordered

## 2014-09-30 ENCOUNTER — Emergency Department (HOSPITAL_BASED_OUTPATIENT_CLINIC_OR_DEPARTMENT_OTHER): Payer: Medicare Other

## 2014-09-30 ENCOUNTER — Encounter (HOSPITAL_BASED_OUTPATIENT_CLINIC_OR_DEPARTMENT_OTHER): Payer: Self-pay

## 2014-09-30 ENCOUNTER — Emergency Department (HOSPITAL_BASED_OUTPATIENT_CLINIC_OR_DEPARTMENT_OTHER)
Admission: EM | Admit: 2014-09-30 | Discharge: 2014-09-30 | Disposition: A | Discharge status: Home / Self Care | Payer: Medicare Other | Attending: Emergency Medicine | Admitting: Emergency Medicine

## 2014-09-30 DIAGNOSIS — K5730 Diverticulosis of large intestine without perforation or abscess without bleeding: Secondary | ICD-10-CM | POA: Diagnosis not present

## 2014-09-30 DIAGNOSIS — I252 Old myocardial infarction: Secondary | ICD-10-CM | POA: Diagnosis not present

## 2014-09-30 DIAGNOSIS — Z79899 Other long term (current) drug therapy: Secondary | ICD-10-CM | POA: Diagnosis not present

## 2014-09-30 DIAGNOSIS — Z880 Allergy status to penicillin: Secondary | ICD-10-CM | POA: Diagnosis not present

## 2014-09-30 DIAGNOSIS — K8020 Calculus of gallbladder without cholecystitis without obstruction: Secondary | ICD-10-CM | POA: Diagnosis not present

## 2014-09-30 DIAGNOSIS — Z7982 Long term (current) use of aspirin: Secondary | ICD-10-CM | POA: Insufficient documentation

## 2014-09-30 DIAGNOSIS — B029 Zoster without complications: Secondary | ICD-10-CM

## 2014-09-30 DIAGNOSIS — I10 Essential (primary) hypertension: Secondary | ICD-10-CM | POA: Insufficient documentation

## 2014-09-30 DIAGNOSIS — Z8739 Personal history of other diseases of the musculoskeletal system and connective tissue: Secondary | ICD-10-CM | POA: Insufficient documentation

## 2014-09-30 DIAGNOSIS — K769 Liver disease, unspecified: Secondary | ICD-10-CM | POA: Diagnosis not present

## 2014-09-30 DIAGNOSIS — R1011 Right upper quadrant pain: Secondary | ICD-10-CM

## 2014-09-30 DIAGNOSIS — N2889 Other specified disorders of kidney and ureter: Secondary | ICD-10-CM

## 2014-09-30 DIAGNOSIS — R109 Unspecified abdominal pain: Secondary | ICD-10-CM

## 2014-09-30 DIAGNOSIS — N1330 Unspecified hydronephrosis: Secondary | ICD-10-CM | POA: Diagnosis not present

## 2014-09-30 DIAGNOSIS — R1031 Right lower quadrant pain: Secondary | ICD-10-CM | POA: Diagnosis not present

## 2014-09-30 DIAGNOSIS — R112 Nausea with vomiting, unspecified: Secondary | ICD-10-CM | POA: Diagnosis present

## 2014-09-30 DIAGNOSIS — R1110 Vomiting, unspecified: Secondary | ICD-10-CM | POA: Diagnosis not present

## 2014-09-30 DIAGNOSIS — R10A0 Flank pain, unspecified side: Secondary | ICD-10-CM

## 2014-09-30 LAB — CBC WITH DIFFERENTIAL/PLATELET
Basophils Absolute: 0 10*3/uL (ref 0.0–0.1)
Basophils Relative: 0 % (ref 0–1)
Eosinophils Absolute: 0 10*3/uL (ref 0.0–0.7)
Eosinophils Relative: 0 % (ref 0–5)
HEMATOCRIT: 37.4 % — AB (ref 39.0–52.0)
Hemoglobin: 11.7 g/dL — ABNORMAL LOW (ref 13.0–17.0)
LYMPHS ABS: 0.7 10*3/uL (ref 0.7–4.0)
LYMPHS PCT: 9 % — AB (ref 12–46)
MCH: 26.5 pg (ref 26.0–34.0)
MCHC: 31.3 g/dL (ref 30.0–36.0)
MCV: 84.6 fL (ref 78.0–100.0)
MONO ABS: 0.6 10*3/uL (ref 0.1–1.0)
Monocytes Relative: 8 % (ref 3–12)
Neutro Abs: 6.1 10*3/uL (ref 1.7–7.7)
Neutrophils Relative %: 83 % — ABNORMAL HIGH (ref 43–77)
Platelets: 307 10*3/uL (ref 150–400)
RBC: 4.42 MIL/uL (ref 4.22–5.81)
RDW: 15.7 % — AB (ref 11.5–15.5)
WBC: 7.4 10*3/uL (ref 4.0–10.5)

## 2014-09-30 LAB — URINALYSIS, ROUTINE W REFLEX MICROSCOPIC
Bilirubin Urine: NEGATIVE
Glucose, UA: NEGATIVE mg/dL
Ketones, ur: 15 mg/dL — AB
LEUKOCYTES UA: NEGATIVE
NITRITE: NEGATIVE
Protein, ur: 30 mg/dL — AB
SPECIFIC GRAVITY, URINE: 1.022 (ref 1.005–1.030)
UROBILINOGEN UA: 0.2 mg/dL (ref 0.0–1.0)
pH: 6.5 (ref 5.0–8.0)

## 2014-09-30 LAB — TROPONIN I
Troponin I: 0.03 ng/mL (ref <0.031)
Troponin I: 0.03 ng/mL (ref <0.031)

## 2014-09-30 LAB — COMPREHENSIVE METABOLIC PANEL
ALT: 13 U/L — ABNORMAL LOW (ref 17–63)
ANION GAP: 10 (ref 5–15)
AST: 15 U/L (ref 15–41)
Albumin: 3.7 g/dL (ref 3.5–5.0)
Alkaline Phosphatase: 69 U/L (ref 38–126)
BUN: 20 mg/dL (ref 6–20)
CALCIUM: 9.1 mg/dL (ref 8.9–10.3)
CHLORIDE: 104 mmol/L (ref 101–111)
CO2: 23 mmol/L (ref 22–32)
CREATININE: 1.07 mg/dL (ref 0.61–1.24)
GFR calc Af Amer: >60 mL/min (ref >60)
GLUCOSE: 124 mg/dL — AB (ref 65–99)
Potassium: 4.2 mmol/L (ref 3.5–5.1)
Sodium: 137 mmol/L (ref 135–145)
Total Bilirubin: 0.8 mg/dL (ref 0.3–1.2)
Total Protein: 7.6 g/dL (ref 6.5–8.1)

## 2014-09-30 LAB — URINE MICROSCOPIC-ADD ON

## 2014-09-30 LAB — BRAIN NATRIURETIC PEPTIDE: B Natriuretic Peptide: 260.2 pg/mL — ABNORMAL HIGH (ref 0.0–100.0)

## 2014-09-30 LAB — LIPASE, BLOOD: Lipase: 29 U/L (ref 22–51)

## 2014-09-30 MED ORDER — VALACYCLOVIR HCL 1 G PO TABS: 1000.0000 mg | ORAL_TABLET | Freq: Three times a day (TID) | ORAL | Status: AC

## 2014-09-30 MED ORDER — VALACYCLOVIR HCL 500 MG PO TABS
1000.0000 mg | ORAL_TABLET | Freq: Once | ORAL | Status: AC
  Administered 2014-09-30: 1000 mg via ORAL
  Filled 2014-09-30: qty 2

## 2014-09-30 MED ORDER — CIPROFLOXACIN HCL 500 MG PO TABS: 500.0000 mg | ORAL_TABLET | Freq: Two times a day (BID) | ORAL | Status: DC

## 2014-09-30 MED ORDER — IOHEXOL 300 MG/ML  SOLN
50.0000 mL | Freq: Once | INTRAMUSCULAR | Status: AC | PRN
  Administered 2014-09-30: 50 mL via ORAL

## 2014-09-30 MED ORDER — ONDANSETRON HCL 4 MG/2ML IJ SOLN
4.0000 mg | Freq: Once | INTRAMUSCULAR | Status: AC
  Administered 2014-09-30: 4 mg via INTRAVENOUS
  Filled 2014-09-30: qty 2

## 2014-09-30 MED ORDER — IOHEXOL 300 MG/ML  SOLN
100.0000 mL | Freq: Once | INTRAMUSCULAR | Status: AC | PRN
  Administered 2014-09-30: 100 mL via INTRAVENOUS

## 2014-09-30 MED ORDER — PROMETHAZINE HCL 25 MG/ML IJ SOLN
12.5000 mg | Freq: Once | INTRAMUSCULAR | Status: AC
  Administered 2014-09-30: 12.5 mg via INTRAVENOUS
  Filled 2014-09-30: qty 1

## 2014-09-30 NOTE — ED Provider Notes (Signed)
CSN: [id number]     Arrival date & time 09/30/14  1544 History  This chart was scribed for Ezequiel Essex, MD by Helane Gunther, ED Scribe. This patient was seen in room MH05/MH05 and the patient's care was started at 4:16 PM.     Chief Complaint  Patient presents with  . Emesis   The history is provided by the patient. No language interpreter was used.   HPI Comments: Derek [name] is a 64 y.o. male with a PMHx of HTN, Gout, and MI, who presents to the Emergency Department complaining of emesis onset this morning. He states he woke up not feeling well and vomited. His last episode of emesis was 5 hours ago. He reports associated RUQ abdominal discomfort. He notes a mass on the right side when he laid down supine, which has since disappeared. He was feeling well last night. He has had a normal BM today. He is currently on Aspirin. He has a PMHx of CAD, CHF, and a PSHx of CABG in 2015. Pt denies fever, blurry vision, diarrhea, bloody stool, dysuria, CP, and testicular pain.  Pt also c/o a rash onset 5 days ago. He states it is not itchy, but has been spreading to his shoulder, upper chest, and neck.  Past Medical History  Diagnosis Date  . Hypertension   . Gout   . NSTEMI (non-ST elevated myocardial infarction)    Past Surgical History  Procedure Laterality Date  . Knee surgery    . Coronary artery bypass graft N/A 01/27/2014    Procedure: CORONARY ARTERY BYPASS GRAFTING (CABG), ON PUMP, TIMES FOUR, USING LEFT INTERNAL MAMMARY ARTERY, RIGHT GREATER SAPHENOUS VEIN HARVESTED ENDOSCOPICALLY;  Surgeon: Gaye Pollack, MD;  Location: [facility];  Service: Open Heart Surgery;  Laterality: N/A;  LIMA-LAD; SVG-OM; SVG-PD; SVG-DIAG  . Tee without cardioversion N/A 01/27/2014    Procedure: TRANSESOPHAGEAL ECHOCARDIOGRAM (TEE);  Surgeon: Gaye Pollack, MD;  Location: [facility];  Service: Open Heart Surgery;  Laterality: N/A;  . Left heart catheterization with coronary angiogram N/A 01/23/2014    Procedure:  LEFT HEART CATHETERIZATION WITH CORONARY ANGIOGRAM;  Surgeon: Blane Ohara, MD;  Location: [facility] C[facility] LAB;  Service: Cardiovascular;  Laterality: N/A;   Family History  Problem Relation Age of Onset  . Cancer Mother   . Cancer Father    History  Substance Use Topics  . Smoking status: Never Smoker   . Smokeless tobacco: Never Used  . Alcohol Use: Yes     Comment: 1-2 drinks/week    Review of Systems  Gastrointestinal: Positive for vomiting and abdominal pain.  Skin: Positive for rash.   A complete 10 system review of systems was obtained and all systems are negative except as noted in the HPI and PMH.    Allergies  Lipitor; Penicillins; and Prednisone  Home Medications   Prior to Admission medications   Medication Sig Start Date End Date Taking? Authorizing Provider  aspirin EC 81 MG tablet Take 81 mg by mouth daily.    Historical Provider, MD  carvedilol (COREG) 3.125 MG tablet Take 1 tablet (3.125 mg total) by mouth 2 (two) times daily with a meal. 02/08/14   [name], MD  Cholecalciferol (VITAMIN D-3) 5000 UNITS TABS Take 1 tablet by mouth daily.     Historical Provider, MD  ezetimibe (ZETIA) 10 MG tablet Take 1 tablet (10 mg total) by mouth daily. 08/07/14   [name], MD  furosemide (LASIX) 20 MG tablet Take 1 tablet  by mouth daily as needed.  05/18/14   Historical Provider, MD  lisinopril (PRINIVIL,ZESTRIL) 10 MG tablet Take 1 tablet (10 mg total) by mouth 2 (two) times daily. 08/04/14   [name], MD  Multiple Vitamin (MULTIVITAMIN) capsule Take 1 capsule by mouth daily.    Historical Provider, MD  potassium chloride SA (K-DUR,KLOR-CON) 20 MEQ tablet Take 1 tablet (20 mEq total) by mouth daily. Patient taking differently: Take 20 mEq by mouth daily. Pt takes 1/2 tab BID 02/20/14   [name], MD  Turmeric 500 MG CAPS Take 1 capsule by mouth daily.    Historical Provider, MD   BP 164/81 mmHg  Pulse 85  Temp(Src) 99.4 F (37.4 C) (Oral)  Resp 18  Ht  5\' 8"  (1.727 m)  Wt 192 lb (87.091 kg)  BMI 29.20 kg/m2  SpO2 96% Physical Exam  Constitutional: He is oriented to person, place, and time. He appears well-developed and well-nourished. No distress.  HENT:  Head: Normocephalic and atraumatic.  Mouth/Throat: Oropharynx is clear and moist. No oropharyngeal exudate.  Eyes: Conjunctivae and EOM are normal. Pupils are equal, round, and reactive to light.  Neck: Normal range of motion. Neck supple.  No meningismus.  Cardiovascular: Normal rate, regular rhythm, normal heart sounds and intact distal pulses.   No murmur heard. Pulmonary/Chest: Effort normal and breath sounds normal. No respiratory distress.  Lungs are clear to auscultation  Abdominal: Soft. There is tenderness. There is guarding. There is no rebound.  TTP to RUQ and RLQ with guarding. Liver feels enlarged.  Musculoskeletal: Normal range of motion. He exhibits no edema or tenderness.  No CVA Tenderness.   Neurological: He is alert and oriented to person, place, and time. No cranial nerve deficit. He exhibits normal muscle tone. Coordination normal.  No ataxia on finger to nose bilaterally. No pronator drift. 5/5 strength throughout. CN 2-12 intact. Negative Romberg. Equal grip strength. Sensation intact. Gait is normal.   Skin: Skin is warm. Rash noted.  Erythematous vesicular rash to right upper chest, shoulder, and neck.  Psychiatric: He has a normal mood and affect. His behavior is normal.  Nursing note and vitals reviewed.   ED Course  Procedures  DIAGNOSTIC STUDIES: Oxygen Saturation is 98% on RA, normal by my interpretation.    COORDINATION OF CARE: 4:24 PM - Discussed probable Shingles. Discussed plans to order diagnostic studies. Pt advised of plan for treatment and pt agrees.  Labs Review Labs Reviewed  URINALYSIS, ROUTINE W REFLEX MICROSCOPIC (NOT AT [facility]) - Abnormal; Notable for the following:    Hgb urine dipstick LARGE (*)    Ketones, ur 15 (*)    Protein,  ur 30 (*)    All other components within normal limits  CBC WITH DIFFERENTIAL/PLATELET - Abnormal; Notable for the following:    Hemoglobin 11.7 (*)    HCT 37.4 (*)    RDW 15.7 (*)    Neutrophils Relative % 83 (*)    Lymphocytes Relative 9 (*)    All other components within normal limits  COMPREHENSIVE METABOLIC PANEL - Abnormal; Notable for the following:    Glucose, Bld 124 (*)    ALT 13 (*)    All other components within normal limits  BRAIN NATRIURETIC PEPTIDE - Abnormal; Notable for the following:    B Natriuretic Peptide 260.2 (*)    All other components within normal limits  URINE MICROSCOPIC-ADD ON - Abnormal; Notable for the following:    Bacteria, UA MANY (*)  All other components within normal limits  URINE CULTURE  TROPONIN I  LIPASE, BLOOD  TROPONIN I    Imaging Review US Abdomen Complete  09/30/2014   CLINICAL DATA:  Nausea and vomiting.  Abdominal pain.  EXAM: ULTRASOUND ABDOMEN COMPLETE  COMPARISON:  None.  FINDINGS: Gallbladder: Sludge and stones within the gallbladder lumen. No gallbladder wall thickening or pericholecystic fluid. Negative sonographic Murphy's sign.  Common bile duct: Diameter: Normal at 2.4 mm  Liver: Hypoechoic lesion in the RIGHT hepatic lobe measures 1.4 cm. No biliary duct dilatation.  IVC: No abnormality visualized.  Pancreas: Visualized portion unremarkable.  Spleen: Size and appearance within normal limits.  Right Kidney: Length: 14.7 cm. A complex solid mass extending from the RIGHT kidney measuring 14.4 x 14.2 x 9.7 cm. Mass has internal blood flow and is roughly ovoid in shape.  Left Kidney: Length: 14.2 cm.  Anechoic 4 cm cyst.  Hydronephrosis  Abdominal aorta: No aneurysm visualized.  Other findings: No ascites.  IMPRESSION: 1. Large RIGHT renal mass measuring up to 14 cm is most consistent renal cell carcinoma. Recommend CT of the abdomen pelvis with contrast for further evaluation. 2. Round lesion within the liver cannot be fully  characterized. Recommend evaluation with CT as above. 3. Benign-appearing cyst of the LEFT kidney. 4. Cholelithiasis without cholecystitis.   Electronically Signed   By: Suzy Bouchard M.D.   On: 09/30/2014 17:41   Ct Abdomen Pelvis W Contrast  09/30/2014   CLINICAL DATA:  Abdominal discomfort and vomiting beginning this morning. Rash over right abdomen. Right renal mass on ultrasound today.  EXAM: CT ABDOMEN AND PELVIS WITH CONTRAST  TECHNIQUE: Multidetector CT imaging of the abdomen and pelvis was performed using the standard protocol following bolus administration of intravenous contrast.  CONTRAST:  140mL OMNIPAQUE IOHEXOL 300 MG/ML SOLN, 69mL OMNIPAQUE IOHEXOL 300 MG/ML SOLN  COMPARISON:  Abdominal ultrasound today.  FINDINGS: Lung bases are within normal. Sternotomy wires are present. There is calcified plaque over the left main and 3 vessel coronary arteries.  Abdominal images demonstrate a sub cm hypodensity over the right dome of the liver too small to characterize but likely a small cyst or hemangioma. 1 cm hyperdensity over the central right lobe of the liver as well as subtle 1.2 cm region of increased attenuation over the anterior segment of the right lobe. These hyperdense area likely represent normal variant AV shunting. The spleen, pancreas and adrenal glands are normal. There is mild cholelithiasis.  There is a 5.8 cm simple cyst over the mid to upper pole of the left kidney. Vascular calcification over the left renal hilum. The right kidney demonstrates a large oval heterogeneously enhancing mass with areas of calcification over the mid to lower pole measuring 9.9 x 14.2 x 12.5 cm in AP, transverse and craniocaudal dimension. This mass displaces bowel anteriorly. No significant adenopathy. There is stranding density and fluid in the right perinephric fat. There are a couple small right renal stones with the larger measuring 7 mm over the mid pole. There is mild dilatation of the right  intrarenal collecting system and ureter. No definite ureteral stones, although there is mild increased density over the distal 2.5 cm of the right ureter which may represent partially obstructing hemorrhagic debris/ clot. There is mild hazy attenuation adjacent the right ureter.  The appendix is not well visualized. There is diverticulosis of the colon. There is mild calcified plaque over the abdominal aorta and iliac arteries.  Pelvic images demonstrate minimal diffuse bladder  wall thickening which may be partially due to underdistention. The prostate and rectum are within normal.  Degenerate change of the spine and hips.  IMPRESSION: Large heterogeneously enhancing right renal mass measuring 9.9 x 14.2 x 12.5 cm likely primary renal cell carcinoma. No significant adjacent adenopathy. This mass displaces bowel anteriorly. There is mild right-sided nephrolithiasis with mild right hydronephrosis. No definite ureteral stones visualized, although there is increased density over the distal 2.5 cm of the right ureter which may represent partially obstructing hemorrhagic debris/ clot.  Sub cm hypodensity over the dome of the right lobe of the liver likely a benign process such as a cyst or hemangioma. Two small hyperdense foci over the right lobe likely due to AV shunting. However, due to the presumed large right renal neoplasm, cannot completely exclude metastatic disease. MRI may be helpful for further evaluation.  5.8 cm simple left renal cyst.  Mild diverticulosis of the colon.  Mild cholelithiasis.  Atherosclerotic coronary artery disease.   Electronically Signed   By: Marin Olp M.D.   On: 09/30/2014 19:09   Dg Abd Acute W/chest  09/30/2014   CLINICAL DATA:  Emesis and right lower quadrant abdominal pain.  EXAM: DG ABDOMEN ACUTE W/ 1V CHEST  COMPARISON:  None.  FINDINGS: The abdominal gas pattern is negative for obstruction or perforation. There is a triangular calcification in the right hemipelvis,  indeterminate but it could represent a ureteral calculus. There probably are collecting system calculi in the right kidney.  The upright view the chest demonstrates mild unchanged cardiomegaly but no acute cardiopulmonary findings.  IMPRESSION: 1. Normal abdominal gas pattern. 2. Possible calculus in the distal right ureter. Probable right nephrolithiasis.   Electronically Signed   By: Andreas Newport M.D.   On: 09/30/2014 17:06     EKG Interpretation   Date/Time:  Saturday September 30 2014 16:08:01 EDT Ventricular Rate:  81 PR Interval:  162 QRS Duration: 82 QT Interval:  402 QTC Calculation: 466 R Axis:   67 Text Interpretation:  Normal sinus rhythm Left ventricular hypertrophy  with repolarization abnormality Abnormal ECG No significant change was  found Confirmed by Wyvonnia Dusky  MD, Olivia Royse [id number]) on 09/30/2014 4:27:56 PM      MDM   Final diagnoses:  RUQ pain  Flank pain  Herpes zoster   Right-sided abdominal discomfort with nausea and vomiting. Questionable mass to the right side with apparently enlarged liver. Patient also with shingles of right shoulder.  X-ray shows possible calculus distal right ureter LFT and lipase normal.  Imaging concerning for large right renal mass and possible liver mass on ultrasound. Offered  Patient admission to expedite workup.  He declines. D/w Dr. Gaynelle Arabian.  He is willing to see patient in followup.  D/w Dr. Whitney Muse of oncology.  She will contact patient on Monday for followup. CT results as above. Cannot rule out liver metastases. Patient's UA shows hematuria with many bacteria. He is afebrile and has no leukocytosis. CT shows possible area of hyperdensity in right ureter likely clot. No ureteral stones.  Again offered patient admission to expedite workup and he again declines.  He has pain medication at home.  EKG unchanged, troponin negative x2.  No chest pain or SOB. Will treat with empiric cipro for possible UTI.  Treat shingles with  valtrex. Followup with urology and oncology. Return precautions discussed.  I personally performed the services described in this documentation, which was scribed in my presence. The recorded information has been reviewed and is accurate.  Ezequiel Essex, MD 10/01/14 [phone]

## 2014-09-30 NOTE — ED Notes (Signed)
Assumed care of patient from [place], [place]. Pt lying on stretcher resting quietly, no distress noted, VSS. Denies pain. Reports nausea - EDP notified. Call bell within reach. Pt awaiting additional imaging.

## 2014-09-30 NOTE — ED Notes (Signed)
Pt given water - able to tolerate without any n/v/d at this time.

## 2014-09-30 NOTE — Discharge Instructions (Signed)
Abdominal Pain Follow-up with the urologist and oncologist regarding your renal mass. Take the antivirals as prescribed for shingles. Return to the ED if you develop new or worsening symptoms. Many things can cause abdominal pain. Usually, abdominal pain is not caused by a disease and will improve without treatment. It can often be observed and treated at home. Your health care provider will do a physical exam and possibly order blood tests and X-rays to help determine the seriousness of your pain. However, in many cases, more time must pass before a clear cause of the pain can be found. Before that point, your health care provider may not know if you need more testing or further treatment. HOME CARE INSTRUCTIONS  Monitor your abdominal pain for any changes. The following actions may help to alleviate any discomfort you are experiencing:  Only take over-the-counter or prescription medicines as directed by your health care provider.  Do not take laxatives unless directed to do so by your health care provider.  Try a clear liquid diet (broth, tea, or water) as directed by your health care provider. Slowly move to a bland diet as tolerated. SEEK MEDICAL CARE IF:  You have unexplained abdominal pain.  You have abdominal pain associated with nausea or diarrhea.  You have pain when you urinate or have a bowel movement.  You experience abdominal pain that wakes you in the night.  You have abdominal pain that is worsened or improved by eating food.  You have abdominal pain that is worsened with eating fatty foods.  You have a fever. SEEK IMMEDIATE MEDICAL CARE IF:   Your pain does not go away within 2 hours.  You keep throwing up (vomiting).  Your pain is felt only in portions of the abdomen, such as the right side or the left lower portion of the abdomen.  You pass bloody or black tarry stools. MAKE SURE YOU:  Understand these instructions.   Will watch your condition.   Will get  help right away if you are not doing well or get worse.  Document Released: 11/27/2004 Document Revised: 02/22/2013 Document Reviewed: 10/27/2012 [facility] Patient Information 2015 [place], [place]. This information is not intended to replace advice given to you by your health care provider. Make sure you discuss any questions you have with your health care provider.

## 2014-09-30 NOTE — ED Notes (Signed)
Pt reports this morning starting 0600, started having abd discomfort with vomiting.  States has intermittent "mass" in his right side of his abd, radiating around.  Also with reported rash to right side of abd with radiation to right shoulder area. Nonpruritic, nonpainful.

## 2014-10-02 LAB — URINE CULTURE: CULTURE: NO GROWTH

## 2014-10-05 ENCOUNTER — Ambulatory Visit (HOSPITAL_COMMUNITY)
Admission: RE | Admit: 2014-10-05 | Discharge: 2014-10-05 | Disposition: A | Discharge status: Home / Self Care | Payer: Medicare Other | Source: Ambulatory Visit | Attending: Urology | Admitting: Urology

## 2014-10-05 ENCOUNTER — Other Ambulatory Visit (HOSPITAL_COMMUNITY): Payer: Self-pay | Admitting: Urology

## 2014-10-05 DIAGNOSIS — N2889 Other specified disorders of kidney and ureter: Secondary | ICD-10-CM | POA: Diagnosis not present

## 2014-10-05 DIAGNOSIS — I509 Heart failure, unspecified: Secondary | ICD-10-CM | POA: Insufficient documentation

## 2014-10-05 DIAGNOSIS — I252 Old myocardial infarction: Secondary | ICD-10-CM | POA: Insufficient documentation

## 2014-10-05 DIAGNOSIS — C641 Malignant neoplasm of right kidney, except renal pelvis: Secondary | ICD-10-CM | POA: Insufficient documentation

## 2014-10-05 DIAGNOSIS — I2510 Atherosclerotic heart disease of native coronary artery without angina pectoris: Secondary | ICD-10-CM | POA: Insufficient documentation

## 2014-10-05 DIAGNOSIS — R310 Gross hematuria: Secondary | ICD-10-CM | POA: Diagnosis not present

## 2014-10-09 ENCOUNTER — Other Ambulatory Visit (HOSPITAL_COMMUNITY): Payer: Self-pay | Admitting: Urology

## 2014-10-09 DIAGNOSIS — N2889 Other specified disorders of kidney and ureter: Secondary | ICD-10-CM

## 2014-10-10 ENCOUNTER — Telehealth: Payer: Self-pay | Admitting: Oncology

## 2014-10-10 NOTE — Telephone Encounter (Signed)
New patient appt-s/w patient wife Jolayne Haines and gave np appt for 08/19 @ 10:30 w/Dr. Alen Blew Referring Dr. [name] Dx-Renal Mass  Referral information scanned

## 2014-10-17 ENCOUNTER — Ambulatory Visit (HOSPITAL_COMMUNITY)
Admission: RE | Admit: 2014-10-17 | Discharge: 2014-10-17 | Disposition: A | Discharge status: Home / Self Care | Payer: Medicare Other | Source: Ambulatory Visit | Attending: Urology | Admitting: Urology

## 2014-10-17 DIAGNOSIS — R590 Localized enlarged lymph nodes: Secondary | ICD-10-CM | POA: Diagnosis not present

## 2014-10-17 DIAGNOSIS — N2889 Other specified disorders of kidney and ureter: Secondary | ICD-10-CM | POA: Insufficient documentation

## 2014-10-17 DIAGNOSIS — C787 Secondary malignant neoplasm of liver and intrahepatic bile duct: Secondary | ICD-10-CM | POA: Insufficient documentation

## 2014-10-17 DIAGNOSIS — K8020 Calculus of gallbladder without cholecystitis without obstruction: Secondary | ICD-10-CM | POA: Diagnosis not present

## 2014-10-17 DIAGNOSIS — C7901 Secondary malignant neoplasm of right kidney and renal pelvis: Secondary | ICD-10-CM | POA: Diagnosis not present

## 2014-10-17 MED ORDER — GADOBENATE DIMEGLUMINE 529 MG/ML IV SOLN
20.0000 mL | Freq: Once | INTRAVENOUS | Status: AC | PRN
  Administered 2014-10-17: 17 mL via INTRAVENOUS

## 2014-10-19 ENCOUNTER — Telehealth: Payer: Self-pay | Admitting: *Deleted

## 2014-10-19 NOTE — Telephone Encounter (Signed)
Tried calling patient to remind him off his appointment with Dr. Alen Blew tomorrow, but no way to leave a message on his cell phone.

## 2014-10-20 ENCOUNTER — Other Ambulatory Visit: Payer: Medicare Other

## 2014-10-20 ENCOUNTER — Ambulatory Visit (HOSPITAL_BASED_OUTPATIENT_CLINIC_OR_DEPARTMENT_OTHER): Payer: Medicare Other | Admitting: Oncology

## 2014-10-20 ENCOUNTER — Ambulatory Visit: Payer: Medicare Other

## 2014-10-20 ENCOUNTER — Telehealth: Payer: Self-pay | Admitting: Oncology

## 2014-10-20 ENCOUNTER — Encounter: Payer: Self-pay | Admitting: Oncology

## 2014-10-20 VITALS — BP 168/78 | HR 83 | Temp 98.4°F | Resp 18 | Ht 68.0 in | Wt 196.2 lb

## 2014-10-20 DIAGNOSIS — C649 Malignant neoplasm of unspecified kidney, except renal pelvis: Secondary | ICD-10-CM

## 2014-10-20 DIAGNOSIS — R160 Hepatomegaly, not elsewhere classified: Secondary | ICD-10-CM | POA: Diagnosis not present

## 2014-10-20 DIAGNOSIS — D649 Anemia, unspecified: Secondary | ICD-10-CM

## 2014-10-20 DIAGNOSIS — N2889 Other specified disorders of kidney and ureter: Secondary | ICD-10-CM | POA: Diagnosis not present

## 2014-10-20 NOTE — Progress Notes (Signed)
Please see consult note.  

## 2014-10-20 NOTE — Consult Note (Signed)
Reason for Referral: Kidney mass.   HPI: This is a pleasant 64 year old gentleman who lived in this area for the majority of his life. 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 He has a history of coronary artery disease status post bypass graft last year. He was in his usual state of health until he presented with abdominal fullness and vague symptoms of feeling poorly. He was evaluated there with a chest x-ray that was unremarkable and subsequently underwent a CT scan on 09/30/2014. His CT scan showed a 14 cm right lower pole renal mass suspicious for neoplasm. There is also a subcentimeter hypodensity in the dome of the right liver suspicious for malignancy but also could be a cyst or hemangioma. He was evaluated by Dr. Alyson Ingles at [facility] urology on 10/05/2014. He is currently under evaluation for possible surgical resection and an MRI was completed on 10/17/2014. The MRI showed a necrotic mass measuring 15 x 10 x 13 cm in the mid to lower right kidney. No tumor thrombus noted. Mild retroperitoneal lymphadenopathy noted and a solitary 2.7 cm liver lesion noted on segment 8 suspicious for malignancy. Patient referred to me for evaluation regarding these findings. Clinically at this time he is asymptomatic. His abdominal fullness of resolves. He did have an episode of gross hematuria that resolved. He did have a urine analysis that showed red cells in his urine. He continues to be very active and perform activity of daily living. 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  He does not report any headaches, blurry vision, syncope or seizures. He does not report any fevers, chills, sweats weight loss or appetite changes. He does not report any chest pain, palpitation, orthopnea, leg edema. He does not report any cough, wheezing or dyspnea on exertion. He does not report any nausea, vomiting, abdominal pain, flank pain, change in his bowel habits or hematochezia.  He does not report any frequency, urgency or hesitancy. He does not report any nocturia or dysuria. He does not report any skeletal complaints of arthralgias or myalgias. He does not report any bleeding diathesis. Does not report any lymphadenopathy or petechiae. Remaining review of systems unremarkable.   Past Medical History  Diagnosis Date  . Hypertension   . Gout   . NSTEMI (non-ST elevated myocardial infarction)   :  Past Surgical History  Procedure Laterality Date  . Knee surgery    . Coronary artery bypass graft N/A 01/27/2014    Procedure: CORONARY ARTERY BYPASS GRAFTING (CABG), ON PUMP, TIMES FOUR, USING LEFT INTERNAL MAMMARY ARTERY, RIGHT GREATER SAPHENOUS VEIN HARVESTED ENDOSCOPICALLY;  Surgeon: Gaye Pollack, MD;  Location: [place];  Service: Open Heart Surgery;  Laterality: N/A;  LIMA-LAD; SVG-OM; SVG-PD; SVG-DIAG  . Tee without cardioversion N/A 01/27/2014    Procedure: TRANSESOPHAGEAL ECHOCARDIOGRAM (TEE);  Surgeon: Gaye Pollack, MD;  Location: [place];  Service: Open Heart Surgery;  Laterality: N/A;  . Left heart catheterization with coronary angiogram N/A 01/23/2014    Procedure: LEFT HEART CATHETERIZATION WITH CORONARY ANGIOGRAM;  Surgeon: Blane Ohara, MD;  Location: [facility] CATH LAB;  Service: Cardiovascular;  Laterality: N/A;  :   Current outpatient prescriptions:  .  aspirin EC 81 MG tablet, Take 81 mg by mouth daily., Disp: , Rfl:  .  carvedilol (COREG) 3.125 MG tablet, Take 1 tablet (3.125 mg total) by mouth 2 (two) times daily with a meal., Disp: 60 tablet, Rfl: 6 .  Cholecalciferol (VITAMIN D-3) 5000  UNITS TABS, Take 1 tablet by mouth daily. , Disp: , Rfl:  .  furosemide (LASIX) 20 MG tablet, Take 1 tablet by mouth daily as needed. , Disp: , Rfl: 1 .  lisinopril (PRINIVIL,ZESTRIL) 10 MG tablet, Take 1 tablet (10 mg total) by mouth 2 (two) times daily., Disp: 180 tablet, Rfl: 1 .  Multiple Vitamin (MULTIVITAMIN) capsule, Take 1 capsule by mouth daily., Disp: , Rfl:   .  potassium chloride SA (K-DUR,KLOR-CON) 20 MEQ tablet, Take 1 tablet (20 mEq total) by mouth daily. (Patient taking differently: Take 20 mEq by mouth daily. Pt takes 1/2 tab BID), Disp: 30 tablet, Rfl: 9 .  Turmeric 500 MG CAPS, Take 1 capsule by mouth daily., Disp: , Rfl: :  Allergies  Allergen Reactions  . Lipitor [Atorvastatin]     Myopathy...severe, with  Myoglobinuria, wheelchair bound for a few days  . Penicillins Anaphylaxis  . Prednisone Anxiety and Other (See Comments)    Extremely high blood pressure after taking medication for first time.  :  Family History  Problem Relation Age of Onset  . Cancer Mother   . Cancer Father   :  Social History   Social History  . Marital Status: Married    Spouse Name: N/A  . Number of Children: N/A  . Years of Education: N/A   Occupational History  . Dentist    Social History Main Topics  . Smoking status: Never Smoker   . Smokeless tobacco: Never Used  . Alcohol Use: Yes     Comment: 1-2 drinks/week  . Drug Use: No  . Sexual Activity: Not on file   Other Topics Concern  . Not on file   Social History Narrative   Lives with wife  :  Pertinent items are noted in HPI.  Exam: Blood pressure 168/78, pulse 83, temperature 98.4 F (36.9 C), temperature source Oral, resp. rate 18, height 5\' 8"  (1.727 m), weight 196 lb 3.2 oz (88.996 kg), SpO2 100 %. General appearance: alert and cooperative Head: Normocephalic, without obvious abnormality Throat: lips, mucosa, and tongue normal; teeth and gums normal Neck: no adenopathy Back: negative Resp: clear to auscultation bilaterally Chest wall: no tenderness Cardio: regular rate and rhythm, S1, S2 normal, no murmur, click, rub or gallop GI: soft, non-tender; bowel sounds normal; no masses,  no organomegaly Extremities: extremities normal, atraumatic, no cyanosis or edema Pulses: 2+ and symmetric Skin: Skin color, texture, turgor normal. No rashes or lesions Lymph nodes:  Cervical, supraclavicular, and axillary nodes normal.  CBC    Component Value Date/Time   WBC 7.4 09/30/2014 1640   RBC 4.42 09/30/2014 1640   HGB 11.7* 09/30/2014 1640   HCT 37.4* 09/30/2014 1640   PLT 307 09/30/2014 1640   MCV 84.6 09/30/2014 1640   MCH 26.5 09/30/2014 1640   MCHC 31.3 09/30/2014 1640   RDW 15.7* 09/30/2014 1640   LYMPHSABS 0.7 09/30/2014 1640   MONOABS 0.6 09/30/2014 1640   EOSABS 0.0 09/30/2014 1640   BASOSABS 0.0 09/30/2014 1640      Chemistry      Component Value Date/Time   NA 137 09/30/2014 1640   K 4.2 09/30/2014 1640   CL 104 09/30/2014 1640   CO2 23 09/30/2014 1640   BUN 20 09/30/2014 1640   CREATININE 1.07 09/30/2014 1640   CREATININE 0.79 08/04/2014 0949      Component Value Date/Time   CALCIUM 9.1 09/30/2014 1640   ALKPHOS 69 09/30/2014 1640   AST 15 09/30/2014 1640  ALT 13* 09/30/2014 1640   BILITOT 0.8 09/30/2014 1640       Dg Chest 2 View  10/06/2014   CLINICAL DATA:  Newly diagnosed right renal cell carcinoma. Coronary artery disease and congestive heart failure. Previous myocardial infarct.  EXAM: CHEST  2 VIEW  COMPARISON:  03/08/2014  FINDINGS: Heart size remains at the upper limits normal. Both lungs are clear. No evidence of pneumothorax or pleural effusion. No evidence congestive heart failure. Eventration of right hemidiaphragm is stable. Prior CABG again noted.  IMPRESSION: No active cardiopulmonary disease.   Electronically Signed   By: Earle Gell M.D.   On: 10/06/2014 08:17   Mr Abdomen W Wo Contrast  10/18/2014   CLINICAL DATA:  64 year old male with large right renal mass and indeterminate tiny liver lesions on recent ultrasound and CT.  EXAM: MRI ABDOMEN WITHOUT AND WITH CONTRAST  TECHNIQUE: Multiplanar multisequence MR imaging of the abdomen was performed both before and after the administration of intravenous contrast.  CONTRAST:  52mL MULTIHANCE GADOBENATE DIMEGLUMINE 529 MG/ML IV SOLN  COMPARISON:  09/30/2014 abdominal  sonogram. 09/30/2014 CT abdomen/pelvis.  FINDINGS: Lower chest:  Grossly clear lung bases.  Hepatobiliary: There is a peripheral 2.7 x 1.7 cm liver mass in segment 8 of the liver (series 1101/ image 19), which demonstrates T1 and T2 isointensity to the spleen, prominent restricted diffusion, heterogeneous arterial phase hyperenhancement and delayed central washout with persistent delayed peripheral enhancement, in keeping with a metastasis.  There is a 1.1 by 1.0 cm liver mass in segment 8 of the liver adjacent to the inferior vena cava (series 4/image 6), which demonstrates T2 hyperintensity (hyperintense to the spleen) and enhancement features (discontinuous peripheral nodular arterial phase hyper enhancement with persistent homogeneous hyperenhancement on subsequent contrast phases) diagnostic of a benign hemangioma. There is a nonenhancing simple 0.9 cm liver cyst in segment 8 at the liver dome. Otherwise normal liver, with no additional liver masses and no evidence of hepatic steatosis on chemical shift imaging.  There are several layering subcentimeter gallstones within the nondistended gallbladder, largest 7 mm, with no gallbladder wall thickening or pericholecystic fluid or fat stranding. No intrahepatic or extrahepatic biliary ductal dilatation.  Pancreas: Normal pancreas, with no pancreatic mass, no main pancreatic duct dilation and no pancreas divisum.  Spleen: Normal.  No splenic mass.  Adrenals/Urinary Tract: Normal adrenals. There is a 15.3 x 10.9 x 13.0 cm renal mass in the anterior mid to lower right kidney, which demonstrates areas of restricted diffusion and heterogeneous thick avid peripheral enhancement and a large central region of nonenhancement suggestive of necrosis, in keeping with a right renal cell carcinoma. There is no appreciable tumor thrombus visualized within the right renal vein, which appears patent with preservation of the normal flow voids. There is anterior extension of the  right renal mass to the level of Gerota's fascia, with loss of the normal fat plane between the anterior/lateral margin of the mass in the adjacent abdominal muscle wall, with invasion of the muscle wall not excluded. The mass abuts and anteriorly displaces the duodenum at the junction of the 2nd and 3rd portions the duodenum, with invasion of the duodenal wall not excluded. There is mild pelvocaliectasis in the right renal collecting system, likely due to direct mass effect on the right renal pelvis by the renal mass.  There is a simple 5.8 cm renal cyst in the medial upper left kidney. There are a few additional subcentimeter simple renal cysts in both kidneys. There is no  left hydronephrosis. The left renal vein is patent.  Stomach/Bowel: Collapsed and grossly normal stomach. The visualized small and large bowel are normal caliber.  Vascular/Lymphatic: Normal caliber abdominal aorta. There is a mildly enlarged 1.0 cm short axis aortocaval node (series 1101/image 63). There is a mildly enlarged 1.0 cm short axis retrocaval node (1101/49). Patent portal, splenic and hepatic veins.  Other: No ascites.  No focal intra-abdominal fluid collection.  Musculoskeletal: No enhancing aggressive appearing bone lesions.  IMPRESSION: 1. Avidly enhancing and centrally necrotic 15.3 x 10.9 x 13.0 cm renal mass in the anterior mid to lower right kidney, in keeping with a renal cell carcinoma. No appreciable tumor thrombus in the right renal vein. Loss of the normal fat planes between the renal mass and the right abdominal muscle wall and distal descending duodenum, cannot exclude direct tumor invasion of these structures. Mild pelvocaliectasis in the right renal collecting system due to mass effect on the right renal pelvis by the renal mass. 2. 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 3. Mild retroperitoneal lymphadenopathy, likely metastatic. 4. Cholelithiasis.  No biliary ductal dilatation.   Electronically Signed   By:  Ilona Sorrel M.D.   On: 10/18/2014 08:59   US Abdomen Complete  09/30/2014   CLINICAL DATA:  Nausea and vomiting.  Abdominal pain.  EXAM: ULTRASOUND ABDOMEN COMPLETE  COMPARISON:  None.  FINDINGS: Gallbladder: Sludge and stones within the gallbladder lumen. No gallbladder wall thickening or pericholecystic fluid. Negative sonographic Murphy's sign.  Common bile duct: Diameter: Normal at 2.4 mm  Liver: Hypoechoic lesion in the RIGHT hepatic lobe measures 1.4 cm. No biliary duct dilatation.  IVC: No abnormality visualized.  Pancreas: Visualized portion unremarkable.  Spleen: Size and appearance within normal limits.  Right Kidney: Length: 14.7 cm. A complex solid mass extending from the RIGHT kidney measuring 14.4 x 14.2 x 9.7 cm. Mass has internal blood flow and is roughly ovoid in shape.  Left Kidney: Length: 14.2 cm.  Anechoic 4 cm cyst.  Hydronephrosis  Abdominal aorta: No aneurysm visualized.  Other findings: No ascites.  IMPRESSION: 1. Large RIGHT renal mass measuring up to 14 cm is most consistent renal cell carcinoma. Recommend CT of the abdomen pelvis with contrast for further evaluation. 2. Round lesion within the liver cannot be fully characterized. Recommend evaluation with CT as above. 3. Benign-appearing cyst of the LEFT kidney. 4. Cholelithiasis without cholecystitis.   Electronically Signed   By: Suzy Bouchard M.D.   On: 09/30/2014 17:41   Ct Abdomen Pelvis W Contrast  09/30/2014   CLINICAL DATA:  Abdominal discomfort and vomiting beginning this morning. Rash over right abdomen. Right renal mass on ultrasound today.  EXAM: CT ABDOMEN AND PELVIS WITH CONTRAST  TECHNIQUE: Multidetector CT imaging of the abdomen and pelvis was performed using the standard protocol following bolus administration of intravenous contrast.  CONTRAST:  111mL OMNIPAQUE IOHEXOL 300 MG/ML SOLN, 87mL OMNIPAQUE IOHEXOL 300 MG/ML SOLN  COMPARISON:  Abdominal ultrasound today.  FINDINGS: Lung bases are within normal.  Sternotomy wires are present. There is calcified plaque over the left main and 3 vessel coronary arteries.  Abdominal images demonstrate a sub cm hypodensity over the right dome of the liver too small to characterize but likely a small cyst or hemangioma. 1 cm hyperdensity over the central right lobe of the liver as well as subtle 1.2 cm region of increased attenuation over the anterior segment of the right lobe. These hyperdense area likely represent normal variant AV shunting. The  spleen, pancreas and adrenal glands are normal. There is mild cholelithiasis.  There is a 5.8 cm simple cyst over the mid to upper pole of the left kidney. Vascular calcification over the left renal hilum. The right kidney demonstrates a large oval heterogeneously enhancing mass with areas of calcification over the mid to lower pole measuring 9.9 x 14.2 x 12.5 cm in AP, transverse and craniocaudal dimension. This mass displaces bowel anteriorly. No significant adenopathy. There is stranding density and fluid in the right perinephric fat. There are a couple small right renal stones with the larger measuring 7 mm over the mid pole. There is mild dilatation of the right intrarenal collecting system and ureter. No definite ureteral stones, although there is mild increased density over the distal 2.5 cm of the right ureter which may represent partially obstructing hemorrhagic debris/ clot. There is mild hazy attenuation adjacent the right ureter.  The appendix is not well visualized. There is diverticulosis of the colon. There is mild calcified plaque over the abdominal aorta and iliac arteries.  Pelvic images demonstrate minimal diffuse bladder wall thickening which may be partially due to underdistention. The prostate and rectum are within normal.  Degenerate change of the spine and hips.  IMPRESSION: Large heterogeneously enhancing right renal mass measuring 9.9 x 14.2 x 12.5 cm likely primary renal cell carcinoma. No significant adjacent  adenopathy. This mass displaces bowel anteriorly. There is mild right-sided nephrolithiasis with mild right hydronephrosis. No definite ureteral stones visualized, although there is increased density over the distal 2.5 cm of the right ureter which may represent partially obstructing hemorrhagic debris/ clot.  Sub cm hypodensity over the dome of the right lobe of the liver likely a benign process such as a cyst or hemangioma. Two small hyperdense foci over the right lobe likely due to AV shunting. However, due to the presumed large right renal neoplasm, cannot completely exclude metastatic disease. MRI may be helpful for further evaluation.  5.8 cm simple left renal cyst.  Mild diverticulosis of the colon.  Mild cholelithiasis.  Atherosclerotic coronary artery disease.   Electronically Signed   By: Marin Olp M.D.   On: 09/30/2014 19:09   Dg Abd Acute W/chest  09/30/2014   CLINICAL DATA:  Emesis and right lower quadrant abdominal pain.  EXAM: DG ABDOMEN ACUTE W/ 1V CHEST  COMPARISON:  None.  FINDINGS: The abdominal gas pattern is negative for obstruction or perforation. There is a triangular calcification in the right hemipelvis, indeterminate but it could represent a ureteral calculus. There probably are collecting system calculi in the right kidney.  The upright view the chest demonstrates mild unchanged cardiomegaly but no acute cardiopulmonary findings.  IMPRESSION: 1. Normal abdominal gas pattern. 2. Possible calculus in the distal right ureter. Probable right nephrolithiasis.   Electronically Signed   By: Andreas Newport M.D.   On: 09/30/2014 17:06    Assessment and Plan:    64 year old gentleman with the following issues:  1. Right kidney mass diagnosed in August 2016 measuring 15.3 x 10.9 x 3.0 cm in the right kidney. There is possible mild retroperitoneal lymphadenopathy and a 2.7 cm liver mass suspicious for metastasis. These findings or suspicious for stage IV kidney cancer. The natural  course of this disease was discussed with the patient and his wife. Treatment options were also outlined.  Interval likelihood he will require multiple modality treatment given the bulk of his cancer and his presentation. I feel that he would be an excellent candidate for a cytoreductive  nephrectomy given his age, performance status and that the bulk of the tumor is in the right kidney. Although the evidence of cytoreductive nephrectomy is not clear in the tyrosine kinase era of therapy, this has been very clearly documented in the immunotherapy era.  Preference is to proceed with nephrectomy if he can be done successfully followed by systemic therapy. Biopsying or removing his liver lesion would also be ideal at this time to try to reduce the bulk of his metastasis.  Following that, systemic therapy would be mandatory. Options of systemic therapy were discussed today including high-dose IL-2, tyrosine kinase inhibitors and mTOR inhibitors.  He would be an excellent candidate for systemic therapy but I do not think that high-dose IL-2 would be a reasonable option for him at this time.  I discussed with him the risks and benefits of Votrient as a representative of the oral biological options. Complications include diarrhea, fatigue, hypertension, liver function abnormalities among others. He understand these are not curable options but certainly can palliate his cancer for an extended period of time.  The options of therapy could also be dictated by the final pathology but hopefully obtained after a nephrectomy.  I'll arrange a follow-up for him after his nephrectomy to discuss systemic therapy at that time.  2. Anemia: His hemoglobin is 11.7 and likely related due to microscopic blood loss and possibly anemia of chronic disease.  3. Coronary artery disease: He is status post bypass graft surgery and will probably need cardiology clearance before any extensive operations.  4. Liver mass:  Suspicious for metastatic disease: A biopsy or resection would be ideal if it can be done with his nephrectomy.  5. Renal function prophylaxis: This kidney function appear adequate at this time.  6. Prognosis: This was discussed today and I think after obtaining the final histology would be be able to determine that. But he understand this we are dealing with likely incurable disease. However, this disease can be palliated for an extended period of time.  7. Follow-up: Will be in a few weeks after his operation sooner if surgery cannot be done.

## 2014-10-20 NOTE — Progress Notes (Signed)
Checked in new pt with no financial concerns prior to seeing the dr.  Pt has my card for any billing questions, concerns or if financial assistance is needed.  ° °

## 2014-10-20 NOTE — Telephone Encounter (Signed)
Gave and printed appt sched and avs for pt for Sept °

## 2014-10-27 ENCOUNTER — Telehealth: Payer: Self-pay | Admitting: *Deleted

## 2014-10-27 NOTE — Telephone Encounter (Signed)
Ok to hold aspirin - would recommend 7-10 days - low risk for nephrectomy from a cardiac standpoint.  Dr. [name]

## 2014-10-27 NOTE — Telephone Encounter (Signed)
Message has been routed to Dr. Alyson Ingles and routed via [facility] fax.

## 2014-10-27 NOTE — Telephone Encounter (Signed)
[facility] requests surgical clearance:   1. Type of surgery: right open nephrectomy (15cm right renal mass, mild abdominal pain per 10/20/14 note) 2. Date of surgery: TBD - request to be done ASAP 3. Surgeon: Dr. [name] 4. Medications that need to be held & how long: aspirin for 5 days prior  5. Fax and/or Phone: (p) [phone]   (f) [phone]

## 2014-10-30 ENCOUNTER — Other Ambulatory Visit: Payer: Self-pay | Admitting: General Surgery

## 2014-10-31 DIAGNOSIS — C787 Secondary malignant neoplasm of liver and intrahepatic bile duct: Secondary | ICD-10-CM | POA: Diagnosis not present

## 2014-10-31 DIAGNOSIS — C649 Malignant neoplasm of unspecified kidney, except renal pelvis: Secondary | ICD-10-CM | POA: Diagnosis not present

## 2014-11-02 ENCOUNTER — Other Ambulatory Visit: Payer: Self-pay | Admitting: Urology

## 2014-11-03 ENCOUNTER — Other Ambulatory Visit: Payer: Self-pay | Admitting: Urology

## 2014-11-10 ENCOUNTER — Other Ambulatory Visit: Payer: Self-pay | Admitting: Urology

## 2014-11-10 NOTE — Progress Notes (Signed)
Please put order for obtain consent in Epic surgery 11-28-14 pre op 11-24-14 Thanks

## 2014-11-23 NOTE — Pre-Procedure Instructions (Signed)
    Blanchard Bagnell  11/23/2014      [place], [place] [address] [place] [address] Phone: [phone] Fax: [phone]  [facility] [place],  - [address] [place] [address] Phone: [phone] Fax: [phone]    Your procedure is scheduled on Tuesday, September 27th, 2016.   Report to [facility] Admitting at 5:30 A.M.  Call this number if you have problems the morning of surgery:  [phone]   Remember:  Do not eat food or drink liquids after midnight.   Take these medicines the morning of surgery with A SIP OF WATER: Carvedilol (Coreg).  Stop taking: Aspirin, NSAIDS, Ibuprofen, Naproxen, Aleve, Advil, Motrin, BC's, Goody's, Fish Oil, all herbal medications, and all vitamins.    Do not [place] jewelry.  Do not [place] lotions, powders, or colognes.  You may [place] deodorant.  Men may shave face and neck.  Do not bring valuables to the hospital.  [facility] is not responsible for any belongings or valuables.  Contacts, dentures or bridgework may not be worn into surgery.  Leave your suitcase in the car.  After surgery it may be brought to your room.  For patients admitted to the hospital, discharge time will be determined by your treatment team.  Patients discharged the day of surgery will not be allowed to drive home.   Special instructions:  See attached.   Please read over the following fact sheets that you were given. Pain Booklet, Coughing and Deep Breathing, Blood Transfusion Information and Surgical Site Infection Prevention

## 2014-11-24 ENCOUNTER — Encounter (HOSPITAL_COMMUNITY): Payer: Self-pay

## 2014-11-24 ENCOUNTER — Encounter (HOSPITAL_COMMUNITY)
Admission: RE | Admit: 2014-11-24 | Discharge: 2014-11-24 | Disposition: A | Discharge status: Home / Self Care | Payer: Medicare Other | Source: Ambulatory Visit | Attending: General Surgery | Admitting: General Surgery

## 2014-11-24 ENCOUNTER — Encounter (HOSPITAL_COMMUNITY): Admission: RE | Admit: 2014-11-24 | Payer: Medicare Other | Source: Ambulatory Visit

## 2014-11-24 DIAGNOSIS — Z01818 Encounter for other preprocedural examination: Secondary | ICD-10-CM | POA: Insufficient documentation

## 2014-11-24 DIAGNOSIS — Z951 Presence of aortocoronary bypass graft: Secondary | ICD-10-CM | POA: Insufficient documentation

## 2014-11-24 DIAGNOSIS — Z7982 Long term (current) use of aspirin: Secondary | ICD-10-CM | POA: Insufficient documentation

## 2014-11-24 DIAGNOSIS — Z01812 Encounter for preprocedural laboratory examination: Secondary | ICD-10-CM | POA: Diagnosis not present

## 2014-11-24 DIAGNOSIS — Z0183 Encounter for blood typing: Secondary | ICD-10-CM | POA: Diagnosis not present

## 2014-11-24 DIAGNOSIS — I10 Essential (primary) hypertension: Secondary | ICD-10-CM | POA: Diagnosis not present

## 2014-11-24 DIAGNOSIS — Z79899 Other long term (current) drug therapy: Secondary | ICD-10-CM | POA: Diagnosis not present

## 2014-11-24 DIAGNOSIS — I252 Old myocardial infarction: Secondary | ICD-10-CM | POA: Insufficient documentation

## 2014-11-24 DIAGNOSIS — I2510 Atherosclerotic heart disease of native coronary artery without angina pectoris: Secondary | ICD-10-CM | POA: Insufficient documentation

## 2014-11-24 DIAGNOSIS — C649 Malignant neoplasm of unspecified kidney, except renal pelvis: Secondary | ICD-10-CM | POA: Insufficient documentation

## 2014-11-24 HISTORY — DX: Unspecified osteoarthritis, unspecified site: M19.90

## 2014-11-24 HISTORY — DX: Zoster without complications: B02.9

## 2014-11-24 LAB — COMPREHENSIVE METABOLIC PANEL
ALBUMIN: 3.3 g/dL — AB (ref 3.5–5.0)
ALK PHOS: 57 U/L (ref 38–126)
ALT: 15 U/L — ABNORMAL LOW (ref 17–63)
ANION GAP: 6 (ref 5–15)
AST: 16 U/L (ref 15–41)
BUN: 11 mg/dL (ref 6–20)
CALCIUM: 9.4 mg/dL (ref 8.9–10.3)
CO2: 29 mmol/L (ref 22–32)
Chloride: 107 mmol/L (ref 101–111)
Creatinine, Ser: 0.84 mg/dL (ref 0.61–1.24)
GFR calc Af Amer: >60 mL/min (ref >60)
GFR calc non Af Amer: >60 mL/min (ref >60)
GLUCOSE: 105 mg/dL — AB (ref 65–99)
Potassium: 4.5 mmol/L (ref 3.5–5.1)
SODIUM: 142 mmol/L (ref 135–145)
Total Bilirubin: 0.7 mg/dL (ref 0.3–1.2)
Total Protein: 7.2 g/dL (ref 6.5–8.1)

## 2014-11-24 LAB — PROTIME-INR
INR: 1.1 (ref 0.00–1.49)
Prothrombin Time: 14.4 seconds (ref 11.6–15.2)

## 2014-11-24 LAB — CBC WITH DIFFERENTIAL/PLATELET
BASOS ABS: 0 10*3/uL (ref 0.0–0.1)
BASOS PCT: 0 %
EOS ABS: 0.1 10*3/uL (ref 0.0–0.7)
Eosinophils Relative: 1 %
HEMATOCRIT: 34.5 % — AB (ref 39.0–52.0)
HEMOGLOBIN: 10.6 g/dL — AB (ref 13.0–17.0)
Lymphocytes Relative: 13 %
Lymphs Abs: 0.9 10*3/uL (ref 0.7–4.0)
MCH: 26 pg (ref 26.0–34.0)
MCHC: 30.7 g/dL (ref 30.0–36.0)
MCV: 84.6 fL (ref 78.0–100.0)
MONOS PCT: 10 %
Monocytes Absolute: 0.7 10*3/uL (ref 0.1–1.0)
NEUTROS ABS: 5.5 10*3/uL (ref 1.7–7.7)
NEUTROS PCT: 76 %
Platelets: 298 10*3/uL (ref 150–400)
RBC: 4.08 MIL/uL — AB (ref 4.22–5.81)
RDW: 15.2 % (ref 11.5–15.5)
WBC: 7.2 10*3/uL (ref 4.0–10.5)

## 2014-11-24 LAB — URINALYSIS, ROUTINE W REFLEX MICROSCOPIC
Bilirubin Urine: NEGATIVE
GLUCOSE, UA: NEGATIVE mg/dL
HGB URINE DIPSTICK: NEGATIVE
KETONES UR: NEGATIVE mg/dL
LEUKOCYTES UA: NEGATIVE
Nitrite: NEGATIVE
PROTEIN: NEGATIVE mg/dL
Specific Gravity, Urine: 1.018 (ref 1.005–1.030)
UROBILINOGEN UA: 1 mg/dL (ref 0.0–1.0)
pH: 6 (ref 5.0–8.0)

## 2014-11-24 LAB — PREPARE RBC (CROSSMATCH)

## 2014-11-24 NOTE — Progress Notes (Signed)
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 Stark [name]) and right open nephrectomy (Dr. Nicolette [name]) on 11/28/14. On 09/30/14 he presented to the ED with abdominal discomfort and vomiting, and CT showed a 9.9 X 14.2 X 12.5 cm right renal mass suspicious for primary renal cell carcinoma. There was also a right lobe liver mass that was suspicious for liver metastasis by 10/17/14 MRI.    Other history includes CAD/NSTEMI s/p CABG (LIMA-LAD, SVG-DIAG, SVG-OM, SVG-PDA) 01/27/14 (Dr. Cyndia [name]), HTN, gout, non-smoker. No PCP. HEM-ONC is Dr. Zola [name]. He presents with his wife Derek [name] (spelling?).   Cardiologist is Dr. Debara [name] who recommended to hold ASA 7-10 days prior to surgery and felt patient was low risk for nephrectomy from a cardiac standpoint. He reports he feels well from a cardiopulmonary standpoint. He is back working 4 days a week. No chest pain or SOB. He has had mild RLE/pedal edema since his CABG [facility] RLE). Swelling seems to improved over the weekend when he is not up on his feet so much. He has not used any Lasix recently. Heart RRR, no murmur noted. Lungs clear. No carotid bruits noted. Mild RLE edema.   Meds include ASA 81 mg (on hold), Coreg, Vitamin D, Lasix PRN, lisinopril, MVI, KCl, Turmeric (on hold). He is off Lipitor due to severe myalgias and myopathy.   09/30/14 EKG: NSR, LVH, cannot rule out inferior infarct (age undetermined), non-specific ST abnormality.  01/22/14 Echo: Study Conclusions - Left ventricle: The cavity size was normal. There was moderate concentric hypertrophy. Systolic function was mildly to moderately reduced. The estimated ejection fraction was in the range of 40% to 45%. Severe hypokinesis of theapicalanteroseptal, inferior, and inferoseptal myocardium. Features are consistent with a pseudonormal left ventricular filling pattern, with concomitant abnormal relaxation andincreased filling  pressure (grade 2 diastolic dysfunction). - Mitral valve: Calcified annulus. There was mild regurgitation. - Left atrium: The atrium was moderately dilated.  Last cardiac cath was on 01/23/14 (Pre-CABG) and showed severe 3V CAD, moderately severe segmental LV systolic dysfunction with EF 35%.  01/25/14 Spirometry: FVC 3.09 (72%), FEV1 2.62 (82%).  10/05/14 CXR: Impression: No active cardiopulmonary disease.  Preoperative labs noted. K 4.5, Cr 0.84. H/H 10.6/34.5 PTINR WNL. Glucose 105. T&C done. Defer decision for transfusion to surgeons and/or anesthesiologists.  Dr. Barry [name] is requesting GA with epidural which she already discussed with him. He had post-operative depression following his CABG, but did not tolerate Zoloft very well. He did better after returning to work. In general he doesn't tolerate medications (sensitive to side effects).  Narcotics cause significant constipation, so he welcomes the thought of an epidural which could help limit his post-operative narcotic use. We also discussed some concerns he had about having two big surgical procedures with GA within a relatively short time. With no acute CV/CHF symptoms and with recent coronary revascularization his heart should be in better condition for surgery. Cardiology has also given the okay. He felt his questions were answered, but he was encouraged to discuss any future concerns/questions with his surgeons and/or anesthesiologists on the day of surgery. Plan will be for post-operative ICU stay.   Derek [name] [facility]/Anesthesiology Phone [phone] 11/24/2014 12:27 PM

## 2014-11-24 NOTE — Progress Notes (Addendum)
Cardiologist: Dr. [name]  Left message with scheduler for [facility] ,Dr. Alyson Ingles  No orders in epic for consent.

## 2014-11-27 ENCOUNTER — Other Ambulatory Visit: Payer: Self-pay | Admitting: Urology

## 2014-11-27 MED ORDER — CIPROFLOXACIN IN D5W 400 MG/200ML IV SOLN
400.0000 mg | INTRAVENOUS | Status: AC
  Administered 2014-11-28: 400 mg via INTRAVENOUS
  Filled 2014-11-27: qty 200

## 2014-11-27 MED ORDER — HEPARIN SODIUM (PORCINE) 5000 UNIT/ML IJ SOLN
5000.0000 [IU] | INTRAMUSCULAR | Status: AC
  Administered 2014-11-28: 5000 [IU] via SUBCUTANEOUS
  Filled 2014-11-27: qty 1

## 2014-11-28 ENCOUNTER — Encounter (HOSPITAL_COMMUNITY)
Admission: RE | Disposition: A | Discharge status: Home / Self Care | Payer: Self-pay | Source: Ambulatory Visit | Attending: Urology

## 2014-11-28 ENCOUNTER — Inpatient Hospital Stay (HOSPITAL_COMMUNITY): Payer: Medicare Other | Admitting: Anesthesiology

## 2014-11-28 ENCOUNTER — Inpatient Hospital Stay (HOSPITAL_COMMUNITY)
Admission: RE | Admit: 2014-11-28 | Discharge: 2014-12-02 | DRG: 406 | Disposition: A | Discharge status: Home / Self Care | Payer: Medicare Other | Source: Ambulatory Visit | Attending: Urology | Admitting: Urology

## 2014-11-28 ENCOUNTER — Encounter (HOSPITAL_COMMUNITY): Payer: Self-pay | Admitting: Anesthesiology

## 2014-11-28 ENCOUNTER — Inpatient Hospital Stay (HOSPITAL_COMMUNITY): Payer: Medicare Other | Admitting: Vascular Surgery

## 2014-11-28 ENCOUNTER — Inpatient Hospital Stay (HOSPITAL_COMMUNITY): Payer: Medicare Other

## 2014-11-28 DIAGNOSIS — Z9889 Other specified postprocedural states: Secondary | ICD-10-CM | POA: Diagnosis not present

## 2014-11-28 DIAGNOSIS — N132 Hydronephrosis with renal and ureteral calculous obstruction: Secondary | ICD-10-CM | POA: Diagnosis present

## 2014-11-28 DIAGNOSIS — D62 Acute posthemorrhagic anemia: Secondary | ICD-10-CM | POA: Diagnosis present

## 2014-11-28 DIAGNOSIS — C649 Malignant neoplasm of unspecified kidney, except renal pelvis: Secondary | ICD-10-CM

## 2014-11-28 DIAGNOSIS — J939 Pneumothorax, unspecified: Secondary | ICD-10-CM

## 2014-11-28 DIAGNOSIS — C772 Secondary and unspecified malignant neoplasm of intra-abdominal lymph nodes: Secondary | ICD-10-CM | POA: Diagnosis present

## 2014-11-28 DIAGNOSIS — Z6830 Body mass index (BMI) 30.0-30.9, adult: Secondary | ICD-10-CM | POA: Diagnosis not present

## 2014-11-28 DIAGNOSIS — R109 Unspecified abdominal pain: Secondary | ICD-10-CM | POA: Diagnosis not present

## 2014-11-28 DIAGNOSIS — C787 Secondary malignant neoplasm of liver and intrahepatic bile duct: Secondary | ICD-10-CM | POA: Diagnosis not present

## 2014-11-28 DIAGNOSIS — R634 Abnormal weight loss: Secondary | ICD-10-CM | POA: Diagnosis present

## 2014-11-28 DIAGNOSIS — Z951 Presence of aortocoronary bypass graft: Secondary | ICD-10-CM

## 2014-11-28 DIAGNOSIS — E669 Obesity, unspecified: Secondary | ICD-10-CM | POA: Diagnosis present

## 2014-11-28 DIAGNOSIS — Z7982 Long term (current) use of aspirin: Secondary | ICD-10-CM | POA: Diagnosis not present

## 2014-11-28 DIAGNOSIS — I252 Old myocardial infarction: Secondary | ICD-10-CM | POA: Diagnosis not present

## 2014-11-28 DIAGNOSIS — C641 Malignant neoplasm of right kidney, except renal pelvis: Secondary | ICD-10-CM | POA: Diagnosis present

## 2014-11-28 DIAGNOSIS — R0602 Shortness of breath: Secondary | ICD-10-CM | POA: Diagnosis not present

## 2014-11-28 DIAGNOSIS — N2889 Other specified disorders of kidney and ureter: Secondary | ICD-10-CM | POA: Diagnosis not present

## 2014-11-28 DIAGNOSIS — D649 Anemia, unspecified: Secondary | ICD-10-CM | POA: Diagnosis not present

## 2014-11-28 DIAGNOSIS — I10 Essential (primary) hypertension: Secondary | ICD-10-CM | POA: Diagnosis present

## 2014-11-28 DIAGNOSIS — C7901 Secondary malignant neoplasm of right kidney and renal pelvis: Secondary | ICD-10-CM | POA: Diagnosis not present

## 2014-11-28 DIAGNOSIS — Z79899 Other long term (current) drug therapy: Secondary | ICD-10-CM

## 2014-11-28 HISTORY — DX: Malignant neoplasm of unspecified kidney, except renal pelvis: C64.9

## 2014-11-28 HISTORY — PX: OPEN PARTIAL HEPATECTOMY [83]: SHX5987

## 2014-11-28 HISTORY — PX: NEPHRECTOMY: SHX65

## 2014-11-28 LAB — CBC
HCT: 29.4 % — ABNORMAL LOW (ref 39.0–52.0)
HEMATOCRIT: 26.7 % — AB (ref 39.0–52.0)
HEMOGLOBIN: 8.6 g/dL — AB (ref 13.0–17.0)
Hemoglobin: 9.9 g/dL — ABNORMAL LOW (ref 13.0–17.0)
MCH: 27.3 pg (ref 26.0–34.0)
MCH: 28 pg (ref 26.0–34.0)
MCHC: 32.2 g/dL (ref 30.0–36.0)
MCHC: 33.7 g/dL (ref 30.0–36.0)
MCV: 84.8 fL (ref 78.0–100.0)
MCV: 83.3 fL (ref 78.0–100.0)
PLATELETS: 200 10*3/uL (ref 150–400)
Platelets: 242 10*3/uL (ref 150–400)
RBC: 3.15 MIL/uL — AB (ref 4.22–5.81)
RBC: 3.53 MIL/uL — AB (ref 4.22–5.81)
RDW: 14.5 % (ref 11.5–15.5)
RDW: 14.4 % (ref 11.5–15.5)
WBC: 12.4 10*3/uL — ABNORMAL HIGH (ref 4.0–10.5)
WBC: 8.9 10*3/uL (ref 4.0–10.5)

## 2014-11-28 LAB — BASIC METABOLIC PANEL
ANION GAP: 7 (ref 5–15)
BUN: 10 mg/dL (ref 6–20)
CALCIUM: 8.5 mg/dL — AB (ref 8.9–10.3)
CO2: 24 mmol/L (ref 22–32)
Chloride: 105 mmol/L (ref 101–111)
Creatinine, Ser: 0.97 mg/dL (ref 0.61–1.24)
GLUCOSE: 159 mg/dL — AB (ref 65–99)
POTASSIUM: 4.3 mmol/L (ref 3.5–5.1)
Sodium: 136 mmol/L (ref 135–145)

## 2014-11-28 LAB — APTT: APTT: 27 s (ref 24–37)

## 2014-11-28 LAB — PROTIME-INR
INR: 1.47 (ref 0.00–1.49)
Prothrombin Time: 17.9 seconds — ABNORMAL HIGH (ref 11.6–15.2)

## 2014-11-28 LAB — PREPARE RBC (CROSSMATCH)

## 2014-11-28 SURGERY — HEPATECTOMY, PARTIAL, OPEN
Anesthesia: Epidural | Site: Abdomen | Laterality: Right

## 2014-11-28 MED ORDER — ONDANSETRON 4 MG PO TBDP
4.0000 mg | ORAL_TABLET | Freq: Four times a day (QID) | ORAL | Status: DC | PRN
  Filled 2014-11-28: qty 1

## 2014-11-28 MED ORDER — MIDAZOLAM HCL 2 MG/2ML IJ SOLN
INTRAMUSCULAR | Status: AC
  Filled 2014-11-28: qty 4

## 2014-11-28 MED ORDER — PANTOPRAZOLE SODIUM 40 MG IV SOLR
40.0000 mg | Freq: Every day | INTRAVENOUS | Status: DC
  Administered 2014-11-28 – 2014-11-30 (×3): 40 mg via INTRAVENOUS
  Filled 2014-11-28 (×5): qty 40

## 2014-11-28 MED ORDER — HYDROMORPHONE 0.3 MG/ML IV SOLN
INTRAVENOUS | Status: AC
  Filled 2014-11-28: qty 25

## 2014-11-28 MED ORDER — LIDOCAINE HCL 2 % IJ SOLN
INTRAMUSCULAR | Status: AC
  Filled 2014-11-28: qty 20

## 2014-11-28 MED ORDER — VECURONIUM BROMIDE 10 MG IV SOLR
INTRAVENOUS | Status: DC | PRN
  Administered 2014-11-28: 3 mg via INTRAVENOUS
  Administered 2014-11-28: 1 mg via INTRAVENOUS
  Administered 2014-11-28 (×2): 2 mg via INTRAVENOUS

## 2014-11-28 MED ORDER — LIDOCAINE HCL (PF) 1 % IJ SOLN
INTRAMUSCULAR | Status: AC
  Filled 2014-11-28: qty 30

## 2014-11-28 MED ORDER — ONDANSETRON HCL 4 MG/2ML IJ SOLN: 4.0000 mg | Freq: Four times a day (QID) | INTRAMUSCULAR | Status: DC | PRN

## 2014-11-28 MED ORDER — NALOXONE HCL 0.4 MG/ML IJ SOLN: 0.4000 mg | INTRAMUSCULAR | Status: DC | PRN

## 2014-11-28 MED ORDER — PROPOFOL 10 MG/ML IV BOLUS
INTRAVENOUS | Status: DC | PRN
  Administered 2014-11-28: 50 mg via INTRAVENOUS
  Administered 2014-11-28: 100 mg via INTRAVENOUS

## 2014-11-28 MED ORDER — CHLORHEXIDINE GLUCONATE 0.12 % MT SOLN
15.0000 mL | Freq: Two times a day (BID) | OROMUCOSAL | Status: DC
  Administered 2014-11-28 – 2014-12-01 (×6): 15 mL via OROMUCOSAL
  Filled 2014-11-28 (×5): qty 15

## 2014-11-28 MED ORDER — PROPOFOL 10 MG/ML IV BOLUS
INTRAVENOUS | Status: AC
  Filled 2014-11-28: qty 20

## 2014-11-28 MED ORDER — EVICEL 5 ML EX KIT
PACK | CUTANEOUS | Status: AC
Start: 2014-11-28 — End: 2014-11-28
  Filled 2014-11-28: qty 1

## 2014-11-28 MED ORDER — ARTIFICIAL TEARS OP OINT
TOPICAL_OINTMENT | OPHTHALMIC | Status: DC | PRN
  Administered 2014-11-28: 1 via OPHTHALMIC

## 2014-11-28 MED ORDER — ROCURONIUM BROMIDE 50 MG/5ML IV SOLN
INTRAVENOUS | Status: AC
  Filled 2014-11-28: qty 1

## 2014-11-28 MED ORDER — FENTANYL CITRATE (PF) 250 MCG/5ML IJ SOLN
INTRAMUSCULAR | Status: AC
  Filled 2014-11-28: qty 5

## 2014-11-28 MED ORDER — SODIUM CHLORIDE 0.9 % IV SOLN
INTRAVENOUS | Status: DC | PRN
  Administered 2014-11-28: 11:00:00 via INTRAVENOUS

## 2014-11-28 MED ORDER — SODIUM CHLORIDE 0.9 % IJ SOLN
INTRAMUSCULAR | Status: AC
  Filled 2014-11-28: qty 10

## 2014-11-28 MED ORDER — NEOSTIGMINE METHYLSULFATE 10 MG/10ML IV SOLN
INTRAVENOUS | Status: DC | PRN
  Administered 2014-11-28: 5 mg via INTRAVENOUS

## 2014-11-28 MED ORDER — HEPARIN SODIUM (PORCINE) 5000 UNIT/ML IJ SOLN
5000.0000 [IU] | Freq: Three times a day (TID) | INTRAMUSCULAR | Status: DC
  Administered 2014-11-29 – 2014-12-02 (×9): 5000 [IU] via SUBCUTANEOUS
  Filled 2014-11-28 (×12): qty 1

## 2014-11-28 MED ORDER — CETYLPYRIDINIUM CHLORIDE 0.05 % MT LIQD
7.0000 mL | Freq: Two times a day (BID) | OROMUCOSAL | Status: DC
  Administered 2014-11-29 – 2014-11-30 (×3): 7 mL via OROMUCOSAL

## 2014-11-28 MED ORDER — HYDROMORPHONE 0.3 MG/ML IV SOLN: INTRAVENOUS | Status: DC

## 2014-11-28 MED ORDER — SUCCINYLCHOLINE CHLORIDE 20 MG/ML IJ SOLN
INTRAMUSCULAR | Status: AC
  Filled 2014-11-28: qty 1

## 2014-11-28 MED ORDER — MIDAZOLAM HCL 5 MG/5ML IJ SOLN
INTRAMUSCULAR | Status: DC | PRN
  Administered 2014-11-28: 4 mg via INTRAVENOUS
  Administered 2014-11-28 (×2): 2 mg via INTRAVENOUS

## 2014-11-28 MED ORDER — GLYCOPYRROLATE 0.2 MG/ML IJ SOLN
INTRAMUSCULAR | Status: DC | PRN
  Administered 2014-11-28: .6 mg via INTRAVENOUS

## 2014-11-28 MED ORDER — SODIUM CHLORIDE 0.9 % IV SOLN
INTRAVENOUS | Status: DC
  Administered 2014-11-28 – 2014-12-01 (×6): via INTRAVENOUS

## 2014-11-28 MED ORDER — KCL IN DEXTROSE-NACL 20-5-0.45 MEQ/L-%-% IV SOLN
INTRAVENOUS | Status: DC
  Administered 2014-11-28: 17:00:00 via INTRAVENOUS
  Filled 2014-11-28 (×3): qty 1000

## 2014-11-28 MED ORDER — ASPIRIN 300 MG RE SUPP
300.0000 mg | RECTAL | Status: AC
  Filled 2014-11-28: qty 1

## 2014-11-28 MED ORDER — FENTANYL CITRATE (PF) 100 MCG/2ML IJ SOLN
INTRAMUSCULAR | Status: DC | PRN
  Administered 2014-11-28 (×2): 100 ug via INTRAVENOUS
  Administered 2014-11-28 (×2): 50 ug via INTRAVENOUS
  Administered 2014-11-28: 150 ug via INTRAVENOUS
  Administered 2014-11-28: 50 ug via INTRAVENOUS

## 2014-11-28 MED ORDER — HYDROMORPHONE 0.3 MG/ML IV SOLN
INTRAVENOUS | Status: DC
  Administered 2014-11-28: 13:00:00 via INTRAVENOUS

## 2014-11-28 MED ORDER — HYDROMORPHONE HCL 1 MG/ML IJ SOLN
INTRAMUSCULAR | Status: AC
  Filled 2014-11-28: qty 1

## 2014-11-28 MED ORDER — SUCCINYLCHOLINE CHLORIDE 20 MG/ML IJ SOLN
INTRAMUSCULAR | Status: DC | PRN
  Administered 2014-11-28: 140 mg via INTRAVENOUS

## 2014-11-28 MED ORDER — SODIUM CHLORIDE 0.9 % IV SOLN: 250.0000 mL | INTRAVENOUS | Status: DC | PRN

## 2014-11-28 MED ORDER — ACETAMINOPHEN 325 MG PO TABS: 650.0000 mg | ORAL_TABLET | ORAL | Status: DC | PRN

## 2014-11-28 MED ORDER — GLYCOPYRROLATE 0.2 MG/ML IJ SOLN
INTRAMUSCULAR | Status: AC
  Filled 2014-11-28: qty 3

## 2014-11-28 MED ORDER — DIPHENHYDRAMINE HCL 50 MG/ML IJ SOLN: 12.5000 mg | Freq: Four times a day (QID) | INTRAMUSCULAR | Status: DC | PRN

## 2014-11-28 MED ORDER — FUROSEMIDE 20 MG PO TABS: 20.0000 mg | ORAL_TABLET | Freq: Every day | ORAL | Status: DC | PRN

## 2014-11-28 MED ORDER — NEOSTIGMINE METHYLSULFATE 10 MG/10ML IV SOLN
INTRAVENOUS | Status: AC
  Filled 2014-11-28: qty 1

## 2014-11-28 MED ORDER — HYDROMORPHONE HCL 1 MG/ML IJ SOLN
INTRAMUSCULAR | Status: AC
Start: 2014-11-28 — End: 2014-11-28
  Filled 2014-11-28: qty 1

## 2014-11-28 MED ORDER — CARVEDILOL 3.125 MG PO TABS
ORAL_TABLET | ORAL | Status: AC
  Administered 2014-11-28: 3.125 mg
  Filled 2014-11-28: qty 1

## 2014-11-28 MED ORDER — ZOLPIDEM TARTRATE 5 MG PO TABS: 5.0000 mg | ORAL_TABLET | Freq: Every evening | ORAL | Status: DC | PRN

## 2014-11-28 MED ORDER — DIPHENHYDRAMINE HCL 12.5 MG/5ML PO ELIX
12.5000 mg | ORAL_SOLUTION | Freq: Four times a day (QID) | ORAL | Status: DC | PRN
Start: 2014-11-28 — End: 2014-12-02
  Filled 2014-11-28: qty 5
  Filled 2014-11-28: qty 10

## 2014-11-28 MED ORDER — SIMETHICONE 80 MG PO CHEW
40.0000 mg | CHEWABLE_TABLET | Freq: Four times a day (QID) | ORAL | Status: DC | PRN
  Filled 2014-11-28: qty 1

## 2014-11-28 MED ORDER — SODIUM CHLORIDE 0.9 % IV SOLN
10.0000 mg | INTRAVENOUS | Status: DC | PRN
  Administered 2014-11-28: 10 ug/min via INTRAVENOUS

## 2014-11-28 MED ORDER — HEMOSTATIC AGENTS (NO CHARGE) OPTIME
TOPICAL | Status: DC | PRN
  Administered 2014-11-28: 2 via TOPICAL

## 2014-11-28 MED ORDER — SODIUM CHLORIDE 0.9 % IJ SOLN: 9.0000 mL | INTRAMUSCULAR | Status: DC | PRN

## 2014-11-28 MED ORDER — HYDROMORPHONE 0.3 MG/ML IV SOLN
INTRAVENOUS | Status: DC
  Administered 2014-11-28: 1.2 mg via INTRAVENOUS
  Administered 2014-11-28: 1.8 mg via INTRAVENOUS
  Administered 2014-11-29: 1.2 mg via INTRAVENOUS
  Administered 2014-11-29: 1.97 mg via INTRAVENOUS
  Administered 2014-11-29: 3 mg via INTRAVENOUS
  Administered 2014-11-29: 2.1 mg via INTRAVENOUS
  Administered 2014-11-29: 2.7 mg via INTRAVENOUS
  Administered 2014-11-29: 14:00:00 via INTRAVENOUS
  Administered 2014-11-29: 1.5 mg via INTRAVENOUS
  Administered 2014-11-29: 01:00:00 via INTRAVENOUS
  Administered 2014-11-30: 0.6 mg via INTRAVENOUS
  Administered 2014-11-30: 14:00:00 via INTRAVENOUS
  Administered 2014-11-30: 2.1 mg via INTRAVENOUS
  Administered 2014-11-30: 2.24 mg via INTRAVENOUS
  Administered 2014-11-30: 2.1 mg via INTRAVENOUS
  Administered 2014-11-30: 1.5 mg via INTRAVENOUS
  Administered 2014-12-01: 1.2 mg via INTRAVENOUS
  Filled 2014-11-28 (×3): qty 25

## 2014-11-28 MED ORDER — ALBUMIN HUMAN 5 % IV SOLN
INTRAVENOUS | Status: DC | PRN
  Administered 2014-11-28 (×2): via INTRAVENOUS

## 2014-11-28 MED ORDER — DIPHENHYDRAMINE HCL 12.5 MG/5ML PO ELIX: 12.5000 mg | ORAL_SOLUTION | Freq: Four times a day (QID) | ORAL | Status: DC | PRN

## 2014-11-28 MED ORDER — ONDANSETRON HCL 4 MG/2ML IJ SOLN
INTRAMUSCULAR | Status: AC
  Filled 2014-11-28: qty 2

## 2014-11-28 MED ORDER — HYDROMORPHONE HCL 1 MG/ML IJ SOLN
0.2500 mg | INTRAMUSCULAR | Status: DC | PRN
  Administered 2014-11-28 (×3): 0.5 mg via INTRAVENOUS

## 2014-11-28 MED ORDER — ROPIVACAINE HCL 2 MG/ML IJ SOLN
8.0000 mL/h | INTRAMUSCULAR | Status: DC
  Filled 2014-11-28: qty 200

## 2014-11-28 MED ORDER — ROCURONIUM BROMIDE 100 MG/10ML IV SOLN
INTRAVENOUS | Status: DC | PRN
  Administered 2014-11-28: 50 mg via INTRAVENOUS

## 2014-11-28 MED ORDER — EPHEDRINE SULFATE 50 MG/ML IJ SOLN
INTRAMUSCULAR | Status: AC
  Filled 2014-11-28: qty 1

## 2014-11-28 MED ORDER — CARVEDILOL 3.125 MG PO TABS
3.1250 mg | ORAL_TABLET | Freq: Two times a day (BID) | ORAL | Status: DC
  Administered 2014-11-28 – 2014-12-02 (×8): 3.125 mg via ORAL
  Filled 2014-11-28 (×11): qty 1

## 2014-11-28 MED ORDER — METHOCARBAMOL 500 MG PO TABS
500.0000 mg | ORAL_TABLET | Freq: Four times a day (QID) | ORAL | Status: DC | PRN
  Administered 2014-12-02: 500 mg via ORAL
  Filled 2014-11-28 (×2): qty 1

## 2014-11-28 MED ORDER — BUPIVACAINE-EPINEPHRINE (PF) 0.25% -1:200000 IJ SOLN
INTRAMUSCULAR | Status: AC
  Filled 2014-11-28: qty 30

## 2014-11-28 MED ORDER — TURMERIC 500 MG PO CAPS: 500.0000 mg | ORAL_CAPSULE | Freq: Every day | ORAL | Status: DC

## 2014-11-28 MED ORDER — LIDOCAINE HCL (CARDIAC) 20 MG/ML IV SOLN
INTRAVENOUS | Status: AC
  Filled 2014-11-28: qty 5

## 2014-11-28 MED ORDER — LIDOCAINE HCL (PF) 2 % IJ SOLN
INTRAMUSCULAR | Status: DC | PRN
  Administered 2014-11-28: 2 mL via EPIDURAL
  Administered 2014-11-28: 5 mL via EPIDURAL
  Administered 2014-11-28 (×2): 3 mL via INTRADERMAL

## 2014-11-28 MED ORDER — ASPIRIN 81 MG PO CHEW: 324.0000 mg | CHEWABLE_TABLET | ORAL | Status: AC

## 2014-11-28 MED ORDER — ALBUTEROL SULFATE (2.5 MG/3ML) 0.083% IN NEBU: 2.5000 mg | INHALATION_SOLUTION | RESPIRATORY_TRACT | Status: DC | PRN

## 2014-11-28 MED ORDER — DIPHENHYDRAMINE HCL 50 MG/ML IJ SOLN
12.5000 mg | Freq: Four times a day (QID) | INTRAMUSCULAR | Status: DC | PRN
  Administered 2014-11-30: 12.5 mg via INTRAVENOUS
  Filled 2014-11-28 (×2): qty 1

## 2014-11-28 MED ORDER — CIPROFLOXACIN IN D5W 400 MG/200ML IV SOLN
400.0000 mg | Freq: Two times a day (BID) | INTRAVENOUS | Status: AC
  Administered 2014-11-28: 400 mg via INTRAVENOUS
  Filled 2014-11-28: qty 200

## 2014-11-28 MED ORDER — ASPIRIN EC 81 MG PO TBEC
81.0000 mg | DELAYED_RELEASE_TABLET | Freq: Every day | ORAL | Status: DC
Start: 2014-11-29 — End: 2014-12-02
  Administered 2014-11-29 – 2014-12-02 (×4): 81 mg via ORAL
  Filled 2014-11-28 (×4): qty 1

## 2014-11-28 MED ORDER — ONDANSETRON HCL 4 MG/2ML IJ SOLN
INTRAMUSCULAR | Status: DC | PRN
  Administered 2014-11-28: 4 mg via INTRAVENOUS

## 2014-11-28 MED ORDER — 0.9 % SODIUM CHLORIDE (POUR BTL) OPTIME
TOPICAL | Status: DC | PRN
  Administered 2014-11-28: 2000 mL

## 2014-11-28 MED ORDER — DOCUSATE SODIUM 100 MG PO CAPS
100.0000 mg | ORAL_CAPSULE | Freq: Two times a day (BID) | ORAL | Status: DC
Start: 2014-11-30 — End: 2014-12-02
  Administered 2014-11-30 – 2014-12-02 (×5): 100 mg via ORAL
  Filled 2014-11-28 (×5): qty 1

## 2014-11-28 MED ORDER — METOPROLOL TARTRATE 1 MG/ML IV SOLN: 5.0000 mg | Freq: Four times a day (QID) | INTRAVENOUS | Status: AC | PRN

## 2014-11-28 MED ORDER — EVICEL 5 ML EX KIT
PACK | CUTANEOUS | Status: DC | PRN
  Administered 2014-11-28: 5 mL

## 2014-11-28 MED ORDER — DIPHENHYDRAMINE HCL 50 MG/ML IJ SOLN
12.5000 mg | Freq: Four times a day (QID) | INTRAMUSCULAR | Status: DC | PRN
  Administered 2014-11-30 – 2014-12-01 (×3): 12.5 mg via INTRAVENOUS
  Filled 2014-11-28 (×2): qty 1

## 2014-11-28 MED ORDER — LACTATED RINGERS IV SOLN
INTRAVENOUS | Status: DC | PRN
  Administered 2014-11-28 (×2): via INTRAVENOUS

## 2014-11-28 SURGICAL SUPPLY — 83 items
APPLIER CLIP 9.375 MED OPEN (MISCELLANEOUS) ×3
BLADE 10 SAFETY STRL DISP (BLADE) IMPLANT
BLADE SURG ROTATE 9660 (MISCELLANEOUS) ×3 IMPLANT
CANISTER SUCTION 2500CC (MISCELLANEOUS) ×3 IMPLANT
CATH KIT ON Q 5IN SLV (PAIN MANAGEMENT) IMPLANT
CHLORAPREP W/TINT 26ML (MISCELLANEOUS) ×3 IMPLANT
CLIP APPLIE 9.375 MED OPEN (MISCELLANEOUS) ×2 IMPLANT
CLIP LIGATING HEM O LOK PURPLE (MISCELLANEOUS) ×6 IMPLANT
CLIP LIGATING HEMO O LOK GREEN (MISCELLANEOUS) ×3 IMPLANT
CLIP LIGATING HEMOLOK MED (MISCELLANEOUS) ×3 IMPLANT
CLIP TI LARGE 6 (CLIP) ×9 IMPLANT
CLIP TI MEDIUM 24 (CLIP) ×3 IMPLANT
CLIP TI MEDIUM 6 (CLIP) ×3 IMPLANT
COVER SURGICAL LIGHT HANDLE (MISCELLANEOUS) ×3 IMPLANT
DRAIN CHANNEL 19F RND (DRAIN) ×3 IMPLANT
DRAPE LAPAROSCOPIC ABDOMINAL (DRAPES) ×3 IMPLANT
DRAPE UTILITY XL STRL (DRAPES) ×3 IMPLANT
DRAPE WARM FLUID 44X44 (DRAPE) ×3 IMPLANT
DRSG COVADERM 4X10 (GAUZE/BANDAGES/DRESSINGS) IMPLANT
DRSG COVADERM 4X14 (GAUZE/BANDAGES/DRESSINGS) ×3 IMPLANT
ELECT BLADE 6.5 EXT (BLADE) ×3 IMPLANT
ELECT PAD DSPR THERM+ ADLT (MISCELLANEOUS) ×3 IMPLANT
ELECT REM PT RETURN 9FT ADLT (ELECTROSURGICAL) ×3
ELECTRODE REM PT RTRN 9FT ADLT (ELECTROSURGICAL) ×2 IMPLANT
EVACUATOR SILICONE 100CC (DRAIN) ×3 IMPLANT
GAUZE SPONGE 4X4 16PLY XRAY LF (GAUZE/BANDAGES/DRESSINGS) IMPLANT
GEL ULTRASOUND 20GR AQUASONIC (MISCELLANEOUS) ×3 IMPLANT
GLOVE BIO SURGEON STRL SZ 6 (GLOVE) ×6 IMPLANT
GLOVE BIO SURGEON STRL SZ8 (GLOVE) ×3 IMPLANT
GLOVE BIOGEL PI IND STRL 6.5 (GLOVE) ×2 IMPLANT
GLOVE BIOGEL PI IND STRL 7.0 (GLOVE) ×2 IMPLANT
GLOVE BIOGEL PI IND STRL 8 (GLOVE) ×2 IMPLANT
GLOVE BIOGEL PI INDICATOR 6.5 (GLOVE) ×1
GLOVE BIOGEL PI INDICATOR 7.0 (GLOVE) ×1
GLOVE BIOGEL PI INDICATOR 8 (GLOVE) ×1
GLOVE SURG SS PI 7.0 STRL IVOR (GLOVE) ×6 IMPLANT
GOWN STRL REUS W/ TWL LRG LVL3 (GOWN DISPOSABLE) ×4 IMPLANT
GOWN STRL REUS W/TWL 2XL LVL3 (GOWN DISPOSABLE) ×3 IMPLANT
GOWN STRL REUS W/TWL LRG LVL3 (GOWN DISPOSABLE) ×2
HAND PENCIL TRP OPTION (MISCELLANEOUS) IMPLANT
HEMOSTAT SNOW SURGICEL 2X4 (HEMOSTASIS) ×6 IMPLANT
KIT BASIN OR (CUSTOM PROCEDURE TRAY) ×3 IMPLANT
KIT ROOM TURNOVER OR (KITS) ×3 IMPLANT
NEEDLE BIOPSY 14X6 SOFT TISS (NEEDLE) IMPLANT
NS IRRIG 1000ML POUR BTL (IV SOLUTION) ×6 IMPLANT
PACK GENERAL/GYN (CUSTOM PROCEDURE TRAY) ×3 IMPLANT
PAD ARMBOARD 7.5X6 YLW CONV (MISCELLANEOUS) ×9 IMPLANT
PAD SHARPS MAGNETIC DISPOSAL (MISCELLANEOUS) IMPLANT
PLUG CATH AND CAP STER (CATHETERS) IMPLANT
SCISSORS HARMONIC WAVE 18CM (INSTRUMENTS) ×3 IMPLANT
SPONGE GAUZE 4X4 12PLY STER LF (GAUZE/BANDAGES/DRESSINGS) ×3 IMPLANT
SPONGE LAP 18X18 X RAY DECT (DISPOSABLE) ×15 IMPLANT
STAPLER VISISTAT 35W (STAPLE) ×3 IMPLANT
SUCTION POOLE TIP (SUCTIONS) ×3 IMPLANT
SUT ETHILON 1 TP 1 60 (SUTURE) ×3 IMPLANT
SUT ETHILON 2 0 FS 18 (SUTURE) ×3 IMPLANT
SUT PDS AB 1 TP1 96 (SUTURE) ×6 IMPLANT
SUT PDS II 0 TP-1 LOOPED 60 (SUTURE) ×12 IMPLANT
SUT PROLENE 3 0 SH 48 (SUTURE) ×9 IMPLANT
SUT PROLENE 4 0 RB 1 (SUTURE) ×1
SUT PROLENE 4 0 SH DA (SUTURE) ×3 IMPLANT
SUT PROLENE 4-0 RB1 .5 CRCL 36 (SUTURE) ×2 IMPLANT
SUT SILK 0 TIES 10X30 (SUTURE) ×3 IMPLANT
SUT SILK 2 0 (SUTURE) ×1
SUT SILK 2 0 SH CR/8 (SUTURE) ×3 IMPLANT
SUT SILK 2 0 TIES 10X30 (SUTURE) ×6 IMPLANT
SUT SILK 2-0 18XBRD TIE 12 (SUTURE) ×2 IMPLANT
SUT SILK 3 0 (SUTURE) ×1
SUT SILK 3 0 SH CR/8 (SUTURE) ×3 IMPLANT
SUT SILK 3 0 TIES 10X30 (SUTURE) ×3 IMPLANT
SUT SILK 3-0 18XBRD TIE 12 (SUTURE) ×2 IMPLANT
SUT VIC AB 2-0 CTX 27 (SUTURE) ×18 IMPLANT
SUT VIC AB 2-0 SH 18 (SUTURE) ×6 IMPLANT
SUT VIC AB 3-0 SH 18 (SUTURE) ×3 IMPLANT
SUT VIC AB 3-0 SH 8-18 (SUTURE) IMPLANT
SUT VICRYL AB 2 0 TIES (SUTURE) ×3 IMPLANT
SUT VICRYL AB 3 0 TIES (SUTURE) ×3 IMPLANT
TAPE CLOTH SURG 6X10 WHT LF (GAUZE/BANDAGES/DRESSINGS) ×3 IMPLANT
TOWEL OR 17X24 6PK STRL BLUE (TOWEL DISPOSABLE) IMPLANT
TOWEL OR 17X26 10 PK STRL BLUE (TOWEL DISPOSABLE) ×3 IMPLANT
TRAY FOLEY CATH 14FRSI W/METER (CATHETERS) IMPLANT
TUNNELER SHEATH ON-Q 16GX12 DP (PAIN MANAGEMENT) IMPLANT
WATER STERILE IRR 1000ML POUR (IV SOLUTION) IMPLANT

## 2014-11-28 NOTE — Anesthesia Procedure Notes (Addendum)
Epidural   Epidural Patient location during procedure: pre-op Start time: 11/28/2014 7:43 AM End time: 11/28/2014 8:00 AM  Staffing Anesthesiologist: Roderic Palau Performed by: anesthesiologist   Preanesthetic Checklist Completed: patient identified, site marked, surgical consent, pre-op evaluation, timeout performed, IV checked, risks and benefits discussed, monitors and equipment checked and post-op pain management  Epidural Patient position: sitting Prep: DuraPrep Patient monitoring: heart rate, cardiac monitor, continuous pulse ox and blood pressure Approach: right paramedian Location: thoracic (1-12) Injection technique: LOR air  Needle:  Needle type: Tuohy  Needle gauge: 18 G Needle length: 9 cm Needle insertion depth: 8 cm Catheter at skin depth: 13 cm Test dose: negative and Other  Assessment Sensory level: No sensory level attained. Will replace at the end of the proceedure.  Additional Notes Reason for block:post-op pain management  Procedure Name: Intubation Date/Time: 11/28/2014 8:12 AM Performed by: Jacquiline Doe A Pre-anesthesia Checklist: Patient identified, Emergency Drugs available, Suction available, Patient being monitored and Timeout performed Patient Re-evaluated:Patient Re-evaluated prior to inductionOxygen Delivery Method: Circle system utilized Preoxygenation: Pre-oxygenation with 100% oxygen Intubation Type: IV induction and Cricoid Pressure applied Ventilation: Mask ventilation without difficulty and Oral airway inserted - appropriate to patient size Laryngoscope Size: Mac, 4 and Glidescope Grade View: Grade I Tube type: Subglottic suction tube Tube size: 7.5 mm Number of attempts: 1 Airway Equipment and Method: Rigid stylet Placement Confirmation: ETT inserted through vocal cords under direct vision,  positive ETCO2 and breath sounds checked- equal and bilateral Secured at: 23 cm Tube secured with: Tape Dental Injury: Teeth and  Oropharynx as per pre-operative assessment

## 2014-11-28 NOTE — H&P (Signed)
Derek [name] 10/31/2014 9:25 AM Location: [facility] Patient #: [id number] DOB: 22-Jan-1951 Married / Language: English / Race: White Male  History of Present Illness Derek Klein MD; 10/31/2014 10:48 AM) Patient words: liver mass.  The patient is a 64 year old male who presents with a liver mass. The patient is a 64 year old male who is referred for consultation by Dr. Nicolette [name] of urology for a metastatic liver lesion. He presented with abdominal pain, nausea and vomiting. He also had hematuria. An ultrasound demonstrated a large renal mass in the liver metastases. CT was confirmatory. These results are below. An MRI was performed to better evaluate the vascular anatomy of the renal lesion. No tumor thrombus was seen. He also was found to have a 2.7 cm lesion in segment 8 of the liver. It was described to have arterial hyperenhancement and delayed washout consistent with a metastasis. His energy level is good. He works full-time as a Pharmacist, community. Of note, he did have a coronary artery bypass surgery in November 2015. He was back at work in 4 weeks without any issues. CMET Glucose 124, LFTs normal; CBC mild anemia 37.4% MRI IMPRESSION: 1. Avidly enhancing and centrally necrotic 15.3 x 10.9 x 13.0 cm renal mass in the anterior mid to lower right kidney, in keeping with a renal cell carcinoma. No appreciable tumor thrombus in the right renal vein. Loss of the normal fat planes between the renal mass and the right abdominal muscle wall and distal descending duodenum, cannot exclude direct tumor invasion of these structures. Mild pelvocaliectasis in the right renal collecting system due to mass effect on the right renal pelvis by the renal mass. 2. 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 3. Mild retroperitoneal lymphadenopathy, likely metastatic. 4. Cholelithiasis. No biliary ductal dilatation. CT IMPRESSION: Large heterogeneously enhancing right renal mass measuring 9.9 x  14.2 x 12.5 cm likely primary renal cell carcinoma. No significant adjacent adenopathy. This mass displaces bowel anteriorly. There is mild right-sided nephrolithiasis with mild right hydronephrosis. No definite ureteral stones visualized, although there is increased density over the distal 2.5 cm of the right ureter which may represent partially obstructing hemorrhagic debris/ clot. Sub cm hypodensity over the dome of the right lobe of the liver likely a benign process such as a cyst or hemangioma. Two small hyperdense foci over the right lobe likely due to AV shunting. However, due to the presumed large right renal neoplasm, cannot completely exclude metastatic disease. MRI may be helpful for further evaluation. 5.8 cm simple left renal cyst. Mild diverticulosis of the colon. Mild cholelithiasis. Atherosclerotic coronary artery disease.   Other Problems Derek [name], CMA; 10/31/2014 9:25 AM) Cancer Congestive Heart Failure High blood pressure Myocardial infarction  Past Surgical History Derek [name], CMA; 10/31/2014 9:25 AM) Coronary Artery Bypass Graft Knee Surgery Right.  Diagnostic Studies History Derek [name], [place]; 10/31/2014 9:25 AM) Colonoscopy never  Allergies Derek [name], CMA; 10/31/2014 9:26 AM) Lipitor *ANTIHYPERLIPIDEMICS* Penicillin V *PENICILLINS* PredniSONE *CORTICOSTEROIDS*  Medication History Derek Klein, MD; 10/31/2014 10:48 AM) Aspirin (81MG  Tablet, Oral) Active. Coreg (3.125MG  Tablet, Oral) Active. Vitamin D-3 (5000UNIT Tablet, Oral) Active. Lisinopril (10MG  Tablet, Oral) Active. Multiple Vitamin (Oral) Active. Potassium Chloride (20MEQ Tablet ER, Oral) Active. Turmeric Curcumin (500MG  Capsule, Oral) Active. Medications Reconciled  Social History Derek [name], [place]; 10/31/2014 9:25 AM) Alcohol use Moderate alcohol use. Caffeine use Coffee. No drug use Tobacco use Never smoker.  Family History Derek [name], [place]; 10/31/2014 9:25  AM) Respiratory Condition Father, Mother.  Review  of Systems Derek [name] CMA; 10/31/2014 9:25 AM) General Not Present- Appetite Loss, Chills, Fatigue, Fever, Night Sweats, Weight Gain and Weight Loss. Skin Not Present- Change in Wart/Mole, Dryness, Hives, Jaundice, New Lesions, Non-Healing Wounds, Rash and Ulcer. HEENT Not Present- Earache, Hearing Loss, Hoarseness, Nose Bleed, Oral Ulcers, Ringing in the Ears, Seasonal Allergies, Sinus Pain, Sore Throat, Visual Disturbances, Wears glasses/contact lenses and Yellow Eyes. Respiratory Not Present- Bloody sputum, Chronic Cough, Difficulty Breathing, Snoring and Wheezing. Breast Not Present- Breast Mass, Breast Pain, Nipple Discharge and Skin Changes. Cardiovascular Not Present- Chest Pain, Difficulty Breathing Lying Down, Leg Cramps, Palpitations, Rapid Heart Rate, Shortness of Breath and Swelling of Extremities. Gastrointestinal Not Present- Abdominal Pain, Bloating, Bloody Stool, Change in Bowel Habits, Chronic diarrhea, Constipation, Difficulty Swallowing, Excessive gas, Gets full quickly at meals, Hemorrhoids, Indigestion, Nausea, Rectal Pain and Vomiting. Male Genitourinary Not Present- Blood in Urine, Change in Urinary Stream, Frequency, Impotence, Nocturia, Painful Urination, Urgency and Urine Leakage. Musculoskeletal Not Present- Back Pain, Joint Pain, Joint Stiffness, Muscle Pain, Muscle Weakness and Swelling of Extremities. Neurological Present- Trouble walking. Not Present- Decreased Memory, Fainting, Headaches, Numbness, Seizures, Tingling, Tremor and Weakness.   Vitals Derek [name] CMA; 10/31/2014 9:28 AM) 10/31/2014 9:28 AM Weight: 199 lb Height: 68in Body Surface Area: 2.08 m Body Mass Index: 30.26 kg/m Temp.: 97.46F(Temporal)  Pulse: 81 (Regular)  BP: 148/78 (Sitting, Left Arm, Standard)    Physical Exam Derek Klein MD; 10/31/2014 10:49 AM) General Mental Status-Alert. General Appearance-Consistent with  stated age. Hydration-Well hydrated. Voice-Normal.  Head and Neck Head-normocephalic, atraumatic with no lesions or palpable masses. Trachea-midline. Thyroid Gland Characteristics - normal size and consistency.  Eye Eyeball - Bilateral-Extraocular movements intact. Sclera/Conjunctiva - Bilateral-No scleral icterus.  Chest and Lung Exam Chest and lung exam reveals -quiet, even and easy respiratory effort with no use of accessory muscles and on auscultation, normal breath sounds, no adventitious sounds and normal vocal resonance. Inspection Chest Wall - Normal. Back - normal.  Cardiovascular Cardiovascular examination reveals -normal heart sounds, regular rate and rhythm with no murmurs and normal pedal pulses bilaterally. Note: well healed sternotomy scar   Abdomen Inspection Inspection of the abdomen reveals - No Hernias. Palpation/Percussion Palpation and Percussion of the abdomen reveal - Soft, Non Tender, No Rebound tenderness, No Rigidity (guarding) and No hepatosplenomegaly. Auscultation Auscultation of the abdomen reveals - Bowel sounds normal.  Neurologic Neurologic evaluation reveals -alert and oriented x 3 with no impairment of recent or remote memory. Mental Status-Normal.  Musculoskeletal Global Assessment -Note: no gross deformities.  Normal Exam - Left-Upper Extremity Strength Normal and Lower Extremity Strength Normal. Normal Exam - Right-Upper Extremity Strength Normal and Lower Extremity Strength Normal.  Lymphatic Head & Neck  General Head & Neck Lymphatics: Bilateral - Description - Normal. Axillary  General Axillary Region: Bilateral - Description - Normal. Tenderness - Non Tender. Femoral & Inguinal  Generalized Femoral & Inguinal Lymphatics: Bilateral - Description - No Generalized lymphadenopathy.    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 He has oligo metastatic disease. We will plan a combined right nephrectomy with a partial hepatectomy. He may also need his gallbladder removed at the time of surgery in order to be able to get the lesion out safely.  We discussed the incision, I also discussed the anatomy. I reviewed the risks such as bleeding, possible transfusion, possible bile leak, possible  infection, possible need for additional procedures, possible recurrent cancer, possible heart or lung complications, possible death. The patient understands and wishes to proceed.  45 min spent in evaluation, examination, counseling, and coordination of care. >50% spent in counseling. Current Plans  You are being scheduled for surgery - Our schedulers will call you.  You should hear from our office's scheduling department within 5 working days about the location, date, and time of surgery. We try to make accommodations for patient's preferences in scheduling surgery, but sometimes the OR schedule or the surgeon's schedule prevents [place] from making those accommodations.  If you have not heard from our office [phone]) in 5 working days, call the office and ask for your surgeon's nurse.  If you have other questions about your diagnosis, plan, or surgery, call the office and ask for your surgeon's nurse. Pt Education - flb hepatectomy: discussed with patient and provided information. Follow-up after 3 weeks post op.   Signed by Derek Klein, MD (10/31/2014 10:51 AM)

## 2014-11-28 NOTE — Progress Notes (Addendum)
ANTIBIOTIC CONSULT NOTE - INITIAL  Pharmacy Consult for cipro Indication: s/p nephrectomy and partial hepatectomy  Allergies  Allergen Reactions  . Lipitor [Atorvastatin] Other (See Comments)    Myopathy...severe, with  Myoglobinuria, wheelchair bound for a few days  . Penicillins Anaphylaxis  . Prednisone Anxiety and Other (See Comments)    Extremely high blood pressure after taking medication for first time.    Patient Measurements: Weight: 198 lb (89.812 kg) Adjusted Body Weight:   Vital Signs: Temp: 97.4 F (36.3 C) (09/27 1400) Temp Source: Oral (09/27 0552) BP: 135/74 mmHg (09/27 1315) Pulse Rate: 81 (09/27 1400) Intake/Output from previous day:   Intake/Output from this shift: Total I/O In: 5755 [I.V.:4000; Blood:1005; IV Piggyback:750] Out: 2875 [Urine:525; Drains:50; Blood:2300]  Labs:  Recent Labs  11/28/14 1121  WBC 12.4*  HGB 8.6*  PLT 242   Estimated Creatinine Clearance: 94.2 mL/min (by C-G formula based on Cr of 0.84). No results for input(s): VANCOTROUGH, VANCOPEAK, VANCORANDOM, GENTTROUGH, GENTPEAK, GENTRANDOM, TOBRATROUGH, TOBRAPEAK, TOBRARND, AMIKACINPEAK, AMIKACINTROU, AMIKACIN in the last 72 hours.   Microbiology: No results found for this or any previous visit (from the past 720 hour(s)).  Medical History: Past Medical History  Diagnosis Date  . Hypertension   . Gout   . NSTEMI (non-ST elevated myocardial infarction)   . Arthritis   . Shingles 7/15  . Renal cell carcinoma 11/28/2014    Medications:  Scheduled:  . aspirin  324 mg Oral NOW   Or  . aspirin  300 mg Rectal NOW  . [START ON 11/29/2014] aspirin EC  81 mg Oral Daily  . carvedilol  3.125 mg Oral BID WC  . ciprofloxacin  400 mg Intravenous Q12H  . [START ON 11/30/2014] docusate sodium  100 mg Oral BID  . heparin  5,000 Units Subcutaneous 3 times per day  . HYDROmorphone      . HYDROmorphone      . pantoprazole (PROTONIX) IV  40 mg Intravenous QHS   Infusions:  . sodium  chloride    . dextrose 5 % and 0.45 % NaCl with KCl 20 mEq/L     Assessment: 64 yo male s/p nephrectomy and partial hepatectomy will be started on ciprofloxacin for surgical prophylaxis x1 dose. CrCl > 30.  Got cipro 400 mg iv x1 preop at 0800  Goal of Therapy:   Plan:  - cipro 400 mg iv x1 at 2000 (already written by MD)  So, Tsz-Yin 11/28/2014,2:57 PM

## 2014-11-28 NOTE — Transfer of Care (Signed)
Immediate Anesthesia Transfer of Care Note  Patient: Derek [name]  Procedure(s) Performed: Procedure(s): OPEN PARTIAL HEPATECTOMY (N/A) RIGHT OPEN NEPHRECTOMY (Right)  Patient Location: PACU  Anesthesia Type:General and GA combined with regional for post-op pain  Level of Consciousness: awake, oriented, sedated, patient cooperative and responds to stimulation  Airway & Oxygen Therapy: Patient Spontanous Breathing and Patient connected to face mask oxygen  Post-op Assessment: Report given to RN, Post -op Vital signs reviewed and stable, Patient moving all extremities and Patient moving all extremities X 4  Post vital signs: Reviewed and stable  Last Vitals:  Filed Vitals:   11/28/14 0552  BP: 178/87  Pulse: 90  Temp: 37.1 C  Resp: 20    Complications: No apparent anesthesia complications

## 2014-11-28 NOTE — Anesthesia Preprocedure Evaluation (Addendum)
Anesthesia Evaluation  Patient identified by MRN, date of birth, ID band Patient awake    Reviewed: Allergy & Precautions, H&P , NPO status , Patient's Chart, lab work & pertinent test results  Airway Mallampati: II  TM Distance: >3 FB Neck ROM: Full    Dental no notable dental hx. (+) Teeth Intact, Dental Advisory Given   Pulmonary neg pulmonary ROS,    Pulmonary exam normal breath sounds clear to auscultation       Cardiovascular hypertension, Pt. on medications and Pt. on home beta blockers + Past MI, + CABG and +CHF   Rhythm:Regular Rate:Normal     Neuro/Psych negative neurological ROS  negative psych ROS   GI/Hepatic negative GI ROS, Neg liver ROS,   Endo/Other  negative endocrine ROS  Renal/GU Renal diseaseRenal CA w/ liver mets  negative genitourinary   Musculoskeletal  (+) Arthritis , Osteoarthritis,    Abdominal   Peds  Hematology negative hematology ROS (+)   Anesthesia Other Findings   Reproductive/Obstetrics negative OB ROS                           Anesthesia Physical Anesthesia Plan  ASA: III  Anesthesia Plan: General and Epidural   Post-op Pain Management:    Induction: Intravenous  Airway Management Planned: Oral ETT  Additional Equipment: Arterial line and CVP  Intra-op Plan:   Post-operative Plan: Extubation in OR and Possible Post-op intubation/ventilation  Informed Consent: I have reviewed the patients History and Physical, chart, labs and discussed the procedure including the risks, benefits and alternatives for the proposed anesthesia with the patient or authorized representative who has indicated his/her understanding and acceptance.   Dental advisory given  Plan Discussed with: CRNA  Anesthesia Plan Comments:        Anesthesia Quick Evaluation

## 2014-11-28 NOTE — Interval H&P Note (Signed)
History and Physical Interval Note:  11/28/2014 7:28 AM  Derek [name]  has presented today for surgery, with the diagnosis of METASTATIC RENAL CELL CANCER, RIGHT RENAL MASS  The various methods of treatment have been discussed with the patient and family. After consideration of risks, benefits and other options for treatment, the patient has consented to  Procedure(s): OPEN PARTIAL HEPATECTOMY (N/A) RIGHT OPEN NEPHRECTOMY (Right) as a surgical intervention .  The patient's history has been reviewed, patient examined, no change in status, stable for surgery.  I have reviewed the patient's chart and labs.  Questions were answered to the patient's satisfaction.     BYERLY,FAERA

## 2014-11-28 NOTE — H&P (Signed)
Urology Admission H&P  Chief Complaint: right flank/abdominal pain  History of Present Illness: Mr Derek [name] is a 64yo who presented over 1 month ago with new onset right abdominal/flank pain and a palpable right abdominal mass. He underwent CT which showed a 15cm right renal mass. He then underwent MRI which showed the renal mass and a 3cm liver met. He denies any new LUTS. No fevers/chills/sweats.   Past Medical History  Diagnosis Date  . Hypertension   . Gout   . NSTEMI (non-ST elevated myocardial infarction)   . Arthritis   . Shingles 7/15   Past Surgical History  Procedure Laterality Date  . Knee surgery    . Coronary artery bypass graft N/A 01/27/2014    Procedure: CORONARY ARTERY BYPASS GRAFTING (CABG), ON PUMP, TIMES FOUR, USING LEFT INTERNAL MAMMARY ARTERY, RIGHT GREATER SAPHENOUS VEIN HARVESTED ENDOSCOPICALLY;  Surgeon: Gaye Pollack, MD;  Location: [facility];  Service: Open Heart Surgery;  Laterality: N/A;  LIMA-LAD; SVG-OM; SVG-PD; SVG-DIAG  . Tee without cardioversion N/A 01/27/2014    Procedure: TRANSESOPHAGEAL ECHOCARDIOGRAM (TEE);  Surgeon: Gaye Pollack, MD;  Location: [facility];  Service: Open Heart Surgery;  Laterality: N/A;  . Left heart catheterization with coronary angiogram N/A 01/23/2014    Procedure: LEFT HEART CATHETERIZATION WITH CORONARY ANGIOGRAM;  Surgeon: Blane Ohara, MD;  Location: [facility] CATH LAB;  Service: Cardiovascular;  Laterality: N/A;    Home Medications:  Prescriptions prior to admission  Medication Sig Dispense Refill Last Dose  . aspirin EC 81 MG tablet Take 81 mg by mouth daily.   Past Month at Unknown time  . carvedilol (COREG) 3.125 MG tablet Take 1 tablet (3.125 mg total) by mouth 2 (two) times daily with a meal. 60 tablet 6 11/27/2014 at 0630  . Cholecalciferol (VITAMIN D-3) 5000 UNITS TABS Take 5,000 Units by mouth daily.    Past Month at Unknown time  . lisinopril (PRINIVIL,ZESTRIL) 10 MG tablet Take 1 tablet (10 mg total) by mouth 2 (two) times  daily. 180 tablet 1 11/27/2014 at Unknown time  . Multiple Vitamin (MULTIVITAMIN) capsule Take 1 capsule by mouth daily.   Past Month at Unknown time  . potassium chloride SA (K-DUR,KLOR-CON) 20 MEQ tablet Take 1 tablet (20 mEq total) by mouth daily. (Patient taking differently: Take 10 mEq by mouth 2 (two) times daily. ) 30 tablet 9 11/27/2014 at Unknown time  . Turmeric 500 MG CAPS Take 500 mg by mouth daily.    Past Month at Unknown time  . furosemide (LASIX) 20 MG tablet Take 20 mg by mouth daily as needed for fluid.   1 More than a month at Unknown time   Allergies:  Allergies  Allergen Reactions  . Lipitor [Atorvastatin] Other (See Comments)    Myopathy...severe, with  Myoglobinuria, wheelchair bound for a few days  . Penicillins Anaphylaxis  . Prednisone Anxiety and Other (See Comments)    Extremely high blood pressure after taking medication for first time.    Family History  Problem Relation Age of Onset  . Cancer Mother   . Cancer Father    Social History:  reports that he has never smoked. He has never used smokeless tobacco. He reports that he drinks alcohol. He reports that he does not use illicit drugs.  Review of Systems  Constitutional: Positive for weight loss.  Genitourinary: Positive for flank pain.  All other systems reviewed and are negative.   Physical Exam:  Vital signs in last 24 hours: Temp:  [  98.8 F (37.1 C)] 98.8 F (37.1 C) (09/27 0552) Pulse Rate:  [87-91] 88 (09/27 0715) Resp:  [20] 20 (09/27 0552) BP: (178)/(87) 178/87 mmHg (09/27 0552) SpO2:  [98 %] 98 % (09/27 0552) Weight:  [89.812 kg (198 lb)] 89.812 kg (198 lb) (09/27 0552) Physical Exam  Constitutional: He is oriented to person, place, and time. He appears well-developed and well-nourished.  HENT:  Head: Normocephalic and atraumatic.  Eyes: EOM are normal. Pupils are equal, round, and reactive to light.  Neck: Normal range of motion. No thyromegaly present.  Cardiovascular: Normal rate  and regular rhythm.   Respiratory: Effort normal. No respiratory distress.  GI: Soft. He exhibits mass.  Musculoskeletal: Normal range of motion.  Neurological: He is alert and oriented to person, place, and time.  Skin: Skin is warm and dry.  Psychiatric: He has a normal mood and affect. His behavior is normal. Judgment and thought content normal.    Laboratory Data:  No results found for this or any previous visit (from the past 24 hour(s)). No results found for this or any previous visit (from the past 240 hour(s)). Creatinine:  Recent Labs  11/24/14 1042  CREATININE 0.84   Baseline Creatinine: unknown  Impression/Assessment:  64 yo right renal mass and liver mass  Plan:  The risks/benefits/alternatves to cytoreductive right nephrectomy was explained to the patient and his wife and they understand and wish to proceed with surgery.   Annaly Skop L 11/28/2014, 7:16 AM

## 2014-11-28 NOTE — Brief Op Note (Signed)
11/28/2014  11:57 AM  PATIENT:  Derek [name]  64 y.o. male  PRE-OPERATIVE DIAGNOSIS:  METASTATIC RENAL CELL CANCER, RIGHT RENAL MASS  POST-OPERATIVE DIAGNOSIS:  METASTATIC RENAL CELL CANCER, RIGHT RENAL MASS  PROCEDURE:  Procedure(s): OPEN PARTIAL HEPATECTOMY (N/A) RIGHT OPEN NEPHRECTOMY (Right)  SURGEON:  Surgeon(s) and Role: Panel 1:    * [name], MD - Primary    * Cleon Gustin, MD - Assisting  Panel 2:    * Cleon Gustin, MD - Primary    * [name], MD - Assisting  PHYSICIAN ASSISTANT:   ASSISTANTS: none   ANESTHESIA:   epidural and general  EBL:  Total I/O In: 8657 [I.V.:3500; Blood:1005; IV Piggyback:750] Out: 2675 [Urine:375; Blood:2300]  BLOOD ADMINISTERED: 1 unit PRBCs  DRAINS: ( right lower quadrant) Jackson-Pratt drain(s) with closed bulb suction in the right lower quadrant, Nasogastric Tube and Urinary Catheter (Foley)   LOCAL MEDICATIONS USED:  NONE  SPECIMEN:  Source of Specimen:  right kindey, adrenal, intraaortocaval lymph node, partial hepatectomy  DISPOSITION OF SPECIMEN:  PATHOLOGY  COUNTS:  YES  TOURNIQUET:  * No tourniquets in log *  DICTATION: .Note written in EPIC  PLAN OF CARE: Admit to inpatient   PATIENT DISPOSITION:  PACU - hemodynamically stable.   Delay start of Pharmacological VTE agent (>24hrs) due to surgical blood loss or risk of bleeding: not applicable

## 2014-11-28 NOTE — Progress Notes (Signed)
A thoracic epidural was placed preoperatively but did not seem to function properly during the case. That risk was discussed with the patient and we discussed replacing the epidural catheter at the conclusion of the case if that was necessary. At the conclusion of the case, in which three units of blood were transfused, we rechecked his coagulation panel. The patients INR was elevated to 1.47 and the PTT was elevated as well. These values are too high to safely replace the epidural at this time. We will recheck the coags in the morning and assess the patients pain to determine whether or not to replace the epidural tomorrow. I will wait until the coags normalize before pulling out the existing catheter.

## 2014-11-28 NOTE — Op Note (Signed)
  PREOPERATIVE DIAGNOSIS:  Metastatic renal cell cancer.  POSTOPERATIVE DIAGNOSIS:  same  PROCEDURE PERFORMED:  Partial right hepatectomy   SURGEON:  [name], MD  ASSISTANT:  [name], MD  ANESTHESIA:  General   FINDINGS:  Large malignant kidney mass.  Firm mass in the right anterior liver.    SPECIMEN:  Segment of right liver with lesion to pathology  ESTIMATED BLOOD LOSS:  1500 mL.  COMPLICATIONS:  Unknown.  PROCEDURE: Pt was identified in the holding area and taken to the operating room where he was placed supine on the operating room table.  General endotracheal anesthesia was induced.  His abdomen was prepped and draped in a sterile fashion.  A time-out was performed according to the surgical safety check list.  When all was correct, we continued.    A right subcostal incision was made and extended toward the xiphoid in the midline. The subcutaneous tissues were divided with the Bovie and then the fascia was opened with the Bovie as well. There were several spots on the muscle that were coagulated.  The Bookwalter was then placed for assistance with visualization. The right colon was taken down laterally.  The duodenum was identified and dense adhesions were taken off the renal mass.  The duodenum required sharp dissection away from the tumor and the serosa appeared weak.  This was reinforced with 3-0 silks.    Dr. Alyson Ingles then performed the nephrectomy.  I assisted with that.    ce we were finished with that, the right liver was taken off the posterior attachments.  The liver was propped up with laparotomy sponges.  The mass was immediately palpable in the anticipated location.  There were no other masses palpable on the surface of the liver.  The mass was elevated with 2 #1 vicryls on either side of the mass.  The liver capsule was scored with the cautery.  The harmonic scalpel was used to resect the liver parenchyma.  The liver had a very fatty texture, and it  was difficult to seal down the vessels.  Several #1 vicryls were used to suture the capsule down to the parenchyma.    There was a small site of biliary leakage, which was oversewn with a 4-0 Prolene.  The argon beam coagulator was used to coagulate the liver bed.  This also was irrigated.  SNOW hemostatic agent was placed in the liver bed as well as Evicel.  A 19 Blake drain was placed in the abdomen.  There was still no bile leakage seen after waiting and evaluating the liver bed with a dry lap. #1 looped PDS was used to close the fascia in two layers.   The Opheim drain was then in place with a 2-0 nylon.  The skin was then irrigated and stapled.   The wounds were cleaned, dried and dressed with an island dressing.  Needle, sponge, and instrument counts were correct times 2.  The patient was extubated and taken to PACU in stable condition.     [name], MD

## 2014-11-29 ENCOUNTER — Encounter (HOSPITAL_COMMUNITY): Payer: Self-pay | Admitting: General Surgery

## 2014-11-29 LAB — COMPREHENSIVE METABOLIC PANEL
ALBUMIN: 2.4 g/dL — AB (ref 3.5–5.0)
ALK PHOS: 40 U/L (ref 38–126)
ALT: 126 U/L — ABNORMAL HIGH (ref 17–63)
AST: 112 U/L — AB (ref 15–41)
Anion gap: 6 (ref 5–15)
BILIRUBIN TOTAL: 0.9 mg/dL (ref 0.3–1.2)
BUN: 12 mg/dL (ref 6–20)
CALCIUM: 7.9 mg/dL — AB (ref 8.9–10.3)
CO2: 25 mmol/L (ref 22–32)
Chloride: 104 mmol/L (ref 101–111)
Creatinine, Ser: 1.21 mg/dL (ref 0.61–1.24)
GFR calc Af Amer: >60 mL/min (ref >60)
GFR calc non Af Amer: >60 mL/min (ref >60)
GLUCOSE: 121 mg/dL — AB (ref 65–99)
POTASSIUM: 4.1 mmol/L (ref 3.5–5.1)
SODIUM: 135 mmol/L (ref 135–145)
TOTAL PROTEIN: 4.6 g/dL — AB (ref 6.5–8.1)

## 2014-11-29 LAB — POCT I-STAT 7, (LYTES, BLD GAS, ICA,H+H)
ACID-BASE DEFICIT: 1 mmol/L (ref 0.0–2.0)
BICARBONATE: 24.2 meq/L — AB (ref 20.0–24.0)
Bicarbonate: 25 mEq/L — ABNORMAL HIGH (ref 20.0–24.0)
CALCIUM ION: 1.22 mmol/L (ref 1.13–1.30)
Calcium, Ion: 1.16 mmol/L (ref 1.13–1.30)
HCT: 26 % — ABNORMAL LOW (ref 39.0–52.0)
HEMATOCRIT: 27 % — AB (ref 39.0–52.0)
HEMOGLOBIN: 9.2 g/dL — AB (ref 13.0–17.0)
HEMOGLOBIN: 8.8 g/dL — AB (ref 13.0–17.0)
O2 SAT: 100 %
O2 SAT: 100 %
PCO2 ART: 37.5 mmHg (ref 35.0–45.0)
PCO2 ART: 37.9 mmHg (ref 35.0–45.0)
PH ART: 7.426 (ref 7.350–7.450)
PH ART: 7.408 (ref 7.350–7.450)
PO2 ART: 208 mmHg — AB (ref 80.0–100.0)
POTASSIUM: 3.9 mmol/L (ref 3.5–5.1)
Patient temperature: 35.7
Patient temperature: 35.7
Potassium: 4.2 mmol/L (ref 3.5–5.1)
SODIUM: 137 mmol/L (ref 135–145)
Sodium: 138 mmol/L (ref 135–145)
TCO2: 26 mmol/L (ref 0–100)
TCO2: 25 mmol/L (ref 0–100)
pO2, Arterial: 202 mmHg — ABNORMAL HIGH (ref 80.0–100.0)

## 2014-11-29 LAB — CBC
HEMATOCRIT: 26.2 % — AB (ref 39.0–52.0)
HEMOGLOBIN: 8.5 g/dL — AB (ref 13.0–17.0)
MCH: 26.9 pg (ref 26.0–34.0)
MCHC: 32.4 g/dL (ref 30.0–36.0)
MCV: 82.9 fL (ref 78.0–100.0)
Platelets: 205 10*3/uL (ref 150–400)
RBC: 3.16 MIL/uL — ABNORMAL LOW (ref 4.22–5.81)
RDW: 14.3 % (ref 11.5–15.5)
WBC: 7.1 10*3/uL (ref 4.0–10.5)

## 2014-11-29 LAB — MAGNESIUM: Magnesium: 1.7 mg/dL (ref 1.7–2.4)

## 2014-11-29 LAB — APTT: APTT: 29 s (ref 24–37)

## 2014-11-29 LAB — PHOSPHORUS: Phosphorus: 4.1 mg/dL (ref 2.5–4.6)

## 2014-11-29 LAB — PROTIME-INR
INR: 1.31 (ref 0.00–1.49)
PROTHROMBIN TIME: 16.4 s — AB (ref 11.6–15.2)

## 2014-11-29 MED ORDER — MAGNESIUM SULFATE 2 GM/50ML IV SOLN
2.0000 g | Freq: Once | INTRAVENOUS | Status: AC
  Administered 2014-11-29: 2 g via INTRAVENOUS
  Filled 2014-11-29: qty 50

## 2014-11-29 NOTE — Care Management Note (Signed)
Case Management Note  Patient Details  Name: Derek [name] MRN: [id number] Date of Birth: [birth year]  Subjective/Objective:     Prior to admission, home with wife, independent.  Plan for discharge home               Action/Plan:   Expected Discharge Date:                  Expected Discharge Plan:  Home/Self Care  In-House Referral:     Discharge planning Services  CM Consult  Post Acute Care Choice:    Choice offered to:     DME Arranged:    DME Agency:     HH Arranged:    HH Agency:     Status of Service:  In process, will continue to follow  Medicare Important Message Given:    Date Medicare IM Given:    Medicare IM give by:    Date Additional Medicare IM Given:    Additional Medicare Important Message give by:     If discussed at [place] of Stay Meetings, dates discussed:    Additional Comments:  Vergie Living, RN 11/29/2014, 9:34 AM

## 2014-11-29 NOTE — Op Note (Signed)
.  Preoperative diagnosis: Right renal mass, liver mass  Postop diagnosis: Same  Procedure: 1.  Right open radical nephrectomy 2.  Retroperitoneal lymph node dissection  Attending: [name], MD  Assistant:  [name], MD   Anesthesia: General  Estimated blood loss: 1500 cc  Drains: 16 French Foley catheter, JP drain  Specimens: Right radical nephrectomy and adrenal, intra-aortocaval lymph nodes  Antibiotics: Cipro  Findings: 20cm lower pole renal mass with numerous parasitic vessels. 2 arteries and 2 veins in renal hilum. Matted intra-aortocaval lymph nodes  Indications: Patient is a 64 year old with a history of 20 cm right renal mass with liver metastasis.  The mass was not amenable to partial nephrectomy.  After discussing treatment options patient decided to proceed with right open radical nephrectomy.  Procedure in detail: Prior to procedure consent was obtained. Patient was brought to the operating room and briefing was done sure correct patient, correct procedure, correct site.  General anesthesia was in administered patient was placed in the supine position.   a 21 French catheter was placed.  The abdomen and flank was then prepped and draped usual sterile fashion. A hemichevron incision was made 3cm below xiphoid and extended 3cm below the ribs to the right flank. We then entered the peritoneal cavity. We then dissected along the white line of Toldt.  We then reflected the colon medially. We then kocherized the duodenum. We noted the duodenum was densely adherent to the mass. We oversewed the serosa with 2-0 interrupted silk sutures. We then identified the psoas muscle.  Once this was done we traced it down to the iliac vessels and identified the ureter.  Once we identified the gonadal vein and ureter we the ligated them with Hem-o-loc clips.  The renal veins and renal arteries were skeletonized.  We did we identified 2 renal veins and  2 renal arteries. These were then  ligated with 2-0 silk ties. Once this was done we then freed the kidney from its lateral and posterior attachments. The kidney was then removed with the adrenal gland and sent for pathology. We then turned our attention top the lymph node dissection. There were numerous matted lymph nodes in the intra-aortocaval region and these were removed and sent for pathology. Once the nodes were removed we turned our attention to the liver resection. Please refer to Dr. Chuck Hint dictation. Once the liver resection was complete we then placed a JP drain in the right lower quadrant. We then closed the incision with 1 looped PDS in 2 layers. We then buried the knot with an interrupted 2-0 vicyl stitch. The wound was then irrigated and the skin was then closed with staples. This included the procedure which resulted by the patient.  Complications: None  Condition: Stable, x-rayed, transferred to PACU.  Plan: Patient is to be admitted to the ICU for monitoring. He will be transferred to the floor on POD#1. Foley removal on POD#2.

## 2014-11-29 NOTE — Anesthesia Postprocedure Evaluation (Signed)
  Anesthesia Post-op Note  Patient: Leisure centre manager  Procedure(s) Performed: Procedure(s): OPEN PARTIAL HEPATECTOMY (N/A) RIGHT OPEN NEPHRECTOMY (Right)  Patient Location: PACU  Anesthesia Type:General  Level of Consciousness: awake and alert   Airway and Oxygen Therapy: Patient Spontanous Breathing  Post-op Pain: Controlled  Post-op Assessment: Post-op Vital signs reviewed, Patient's Cardiovascular Status Stable and Respiratory Function Stable  Post-op Vital Signs: Reviewed  Filed Vitals:   11/29/14 0700  BP: 128/66  Pulse: 78  Temp:   Resp: 13    Complications: No apparent anesthesia complications

## 2014-11-29 NOTE — Progress Notes (Signed)
POD #1: Pt this morning is sitting in a chair looking great. He rates his pain at a 2 which may bump up to a 3 with movement. His coags have returned to normal with INR of 1.3, platelets of 205. His vital sign have been stable overnight. He has no complaints. We discussed the fact that the epidural did not work for the case but that due to his coagulation status I did not feel it was safe to replace at that time. I could replace the epidural today if he wishes. The patient does not want me to replace the epidural at this time, as his pain is not great enough. I told him that if his pain levels increase and he changes his mind that we would be happy to place the epidural again later. I pulled the epidural and the tip was intact. The insertion site was clean with no erythema. We will sign off at this time.

## 2014-11-29 NOTE — Clinical Documentation Improvement (Signed)
General Surgery  Can the diagnosis of CHF be further specified? Please document findings in next progress note and include in discharge summary if applicable.   Chronic Combined CHF  Chronic Diastolic CHF  Chronic Systolic CHF  Other  Clinically Undetermined Document any associated diagnoses/conditions  Supporting Information:  Patient currently being treated with PO Lasix 20mg  daily and PO Coreg twice daily  ECHO of 01/22/14 reveals: EF of 40-45% with mildly reduced Systolic Dysfunction with Grade 2 Diastolic Dysfunction  Please exercise your independent, professional judgment when responding. A specific answer is not anticipated or expected.  Thank You,  Zoila Shutter BSN, [place] [phone]

## 2014-11-29 NOTE — Clinical Documentation Improvement (Signed)
General Surgery  Can the diagnosis of anemia be further specified? Please document findings in next progress note and include in discharge summary if applicable.   Acute Blood Loss Anemia  Acute on Chronic Blood Loss Anemia  Chronic Blood Loss Anemia, including the suspected or known cause  Other  Clinically Undetermined  Document any associated diagnoses/conditions.  Supporting Information:  3 units of PRBC's given intraop  EBL following procedures was: 1500cc's  Please exercise your independent, professional judgment when responding. A specific answer is not anticipated or expected.  Thank You,  Zoila Shutter BSN, [place] [phone]

## 2014-11-29 NOTE — Progress Notes (Signed)
1 Day Post-Op  Subjective: Doing well.  Good pain control.  No n/v.    Objective: Vital signs in last 24 hours: Temp:  [97.4 F (36.3 C)-98.4 F (36.9 C)] 98.2 F (36.8 C) (09/28 0807) Pulse Rate:  [71-94] 87 (09/28 0900) Resp:  [12-21] 19 (09/28 0900) BP: (96-179)/(48-88) 145/70 mmHg (09/28 0900) SpO2:  [94 %-100 %] 95 % (09/28 0900) Arterial Line BP: (124-189)/(50-82) 126/50 mmHg (09/28 0500) Weight:  [88.2 kg (194 lb 7.1 oz)] 88.2 kg (194 lb 7.1 oz) (09/28 0600)    Intake/Output from previous day: 09/27 0701 - 09/28 0700 In: 8243 [I.V.:6148; Blood:1005; NG/GT:90; IV Piggyback:1000] Out: 5427 [Urine:1115; Emesis/NG output:200; Drains:125; Blood:2300] Intake/Output this shift: Total I/O In: 250 [I.V.:250] Out: 95 [Urine:95]  General appearance: alert, cooperative and no distress Resp: breathing comfortably Cardio: regular rate and rhythm GI: soft, non distended, approp tender.  serosang output in drain.    Lab Results:   Recent Labs  11/28/14 1455 11/29/14 0335  WBC 8.9 7.1  HGB 9.9* 8.5*  HCT 29.4* 26.2*  PLT 200 205   BMET  Recent Labs  11/28/14 1455 11/29/14 0335  NA 136 135  K 4.3 4.1  CL 105 104  CO2 24 25  GLUCOSE 159* 121*  BUN 10 12  CREATININE 0.97 1.21  CALCIUM 8.5* 7.9*   PT/INR  Recent Labs  11/28/14 1121 11/29/14 0335  LABPROT 17.9* 16.4*  INR 1.47 1.31   ABG  Recent Labs  11/28/14 1023 11/28/14 1119  PHART 7.426 7.408  HCO3 25.0* 24.2*    Studies/Results: Dg Chest Portable 1 View  11/28/2014   CLINICAL DATA:  Postoperative radiograph. Evaluate support apparatus.  EXAM: PORTABLE CHEST 1 VIEW  COMPARISON:  10/05/2014.  FINDINGS: Support apparatus: Endotracheal tube tip 3.3 cm from the carina. RIGHT IJ central line present with the tip in the upper to mid SVC, just proximal to the carina. Enteric tube is present in the stomach. Surgical drain is present in the RIGHT upper quadrant of the abdomen near the deep sulcus sign.   Cardiomediastinal Silhouette:  Cardiomegaly is present.  Lungs: LEFT basilar atelectasis is present extending to the retrocardiac region. There is a deep sulcus sign on the RIGHT. In a supine patient, this likely represents a small pneumothorax even in the absence of a visible pleural line. The deep sulcus extends more inferiorly than new previously seen costophrenic angle on 10/05/2014 radiograph.  Effusions:  None visible.  Other:  None.  IMPRESSION: 1. Support apparatus in adequate position. 2. Deep sulcus sign on the RIGHT, suspicious for small RIGHT pneumothorax. Decubitus radiographs could be performed for further assessment. Potentially this could be artifactual due to gas along the liver margin in this patient with recent abdominal surgery. 3. Critical Value/emergent results were called by telephone at the time of interpretation on 11/28/2014 at 12:16 pm to Dr. Ola Spurr , who verbally acknowledged these results.   Electronically Signed   By: Dereck Ligas M.D.   On: 11/28/2014 12:16    Anti-infectives: Anti-infectives    Start     Dose/Rate Route Frequency Ordered Stop   11/28/14 2000  ciprofloxacin (CIPRO) IVPB 400 mg     400 mg 200 mL/hr over 60 Minutes Intravenous Every 12 hours 11/28/14 1440 11/28/14 2024   11/28/14 0600  ciprofloxacin (CIPRO) IVPB 400 mg     400 mg 200 mL/hr over 60 Minutes Intravenous To [facility] 11/27/14 1310 11/28/14 0830      Assessment/Plan: s/p Procedure(s): OPEN PARTIAL  HEPATECTOMY (N/A) RIGHT OPEN NEPHRECTOMY (Right) Continue foley due to urinary output monitoring Leave on ice chips today  D/c ngt Transfer to floor.    LOS: 1 day    [facility] 11/29/2014

## 2014-11-30 ENCOUNTER — Inpatient Hospital Stay (HOSPITAL_COMMUNITY): Payer: Medicare Other

## 2014-11-30 LAB — PREPARE RBC (CROSSMATCH)

## 2014-11-30 LAB — COMPREHENSIVE METABOLIC PANEL
ALBUMIN: 2.4 g/dL — AB (ref 3.5–5.0)
ALT: 118 U/L — AB (ref 17–63)
AST: 70 U/L — AB (ref 15–41)
Alkaline Phosphatase: 41 U/L (ref 38–126)
Anion gap: 8 (ref 5–15)
BUN: 12 mg/dL (ref 6–20)
CHLORIDE: 102 mmol/L (ref 101–111)
CO2: 23 mmol/L (ref 22–32)
CREATININE: 1.33 mg/dL — AB (ref 0.61–1.24)
Calcium: 8 mg/dL — ABNORMAL LOW (ref 8.9–10.3)
GFR calc Af Amer: >60 mL/min (ref >60)
GFR, EST NON AFRICAN AMERICAN: 54 mL/min — AB (ref >60)
GLUCOSE: 103 mg/dL — AB (ref 65–99)
POTASSIUM: 3.7 mmol/L (ref 3.5–5.1)
SODIUM: 133 mmol/L — AB (ref 135–145)
Total Bilirubin: 1 mg/dL (ref 0.3–1.2)
Total Protein: 4.9 g/dL — ABNORMAL LOW (ref 6.5–8.1)

## 2014-11-30 LAB — CBC
HCT: 22.5 % — ABNORMAL LOW (ref 39.0–52.0)
Hemoglobin: 7.3 g/dL — ABNORMAL LOW (ref 13.0–17.0)
MCH: 27.4 pg (ref 26.0–34.0)
MCHC: 32.4 g/dL (ref 30.0–36.0)
MCV: 84.6 fL (ref 78.0–100.0)
PLATELETS: 176 10*3/uL (ref 150–400)
RBC: 2.66 MIL/uL — AB (ref 4.22–5.81)
RDW: 15 % (ref 11.5–15.5)
WBC: 7.4 10*3/uL (ref 4.0–10.5)

## 2014-11-30 LAB — HEMOGLOBIN AND HEMATOCRIT, BLOOD
HEMATOCRIT: 26.7 % — AB (ref 39.0–52.0)
HEMOGLOBIN: 8.6 g/dL — AB (ref 13.0–17.0)

## 2014-11-30 MED ORDER — SODIUM CHLORIDE 0.9 % IV SOLN
Freq: Once | INTRAVENOUS | Status: AC
  Administered 2014-11-30: 12:00:00 via INTRAVENOUS

## 2014-11-30 MED ORDER — SODIUM CHLORIDE 0.9 % IV SOLN: Freq: Once | INTRAVENOUS | Status: DC

## 2014-11-30 NOTE — Clinical Documentation Improvement (Signed)
General Surgery Urology  Abnormal Lab/Test Results: Please provide a diagnosis associated with the abnormal lab results listed below and document findings in next progress note and include in discharge summary if applicable.  Possible Clinical Conditions associated with below indicators  Other Condition  Cannot Clinically Determine  Supporting Information:  Creatine on 9/27 was 0.9; today's result is 1.33. This is an increase of 0.46 within 48 hours.  Treatment Provided:  IV hydration  Please exercise your independent, professional judgment when responding. A specific answer is not anticipated or expected.  Thank You,  Zoila Shutter BSN, [place] [phone]

## 2014-11-30 NOTE — Progress Notes (Signed)
POD#1 Subjective: Patient reports pain control good. Mild pain from lateral aspect of incision. No n/v. No events in the ICU  Objective: Vital signs in last 24 hours: Temp:  [98.2 F (36.8 C)-101.2 F (38.4 C)] 99.2 F (37.3 C) (09/29 0648) Pulse Rate:  [84-105] 84 (09/29 0648) Resp:  [15-25] 17 (09/29 0648) BP: (125-179)/(57-85) 150/66 mmHg (09/29 0648) SpO2:  [91 %-100 %] 96 % (09/29 0648) Weight:  [88.4 kg (194 lb 14.2 oz)] 88.4 kg (194 lb 14.2 oz) (09/29 0500)  Intake/Output from previous day: 09/28 0701 - 09/29 0700 In: 0932 [I.V.:1375] Out: 1290 [Urine:1095; Emesis/NG output:10; Drains:185] Intake/Output this shift: Total I/O In: -  Out: 765 [Urine:650; Drains:115]  Physical Exam:  General:alert, cooperative and appears stated age GI: soft and distended Male genitalia: no penile lesions or discharge no testicular masses no bladder distension noted Resp: clear to auscultation bilaterally Extremities: extremities normal, atraumatic, no cyanosis or edema  Lab Results:  Recent Labs  11/28/14 1455 11/29/14 0335 11/30/14 0259  HGB 9.9* 8.5* 7.3*  HCT 29.4* 26.2* 22.5*   BMET  Recent Labs  11/29/14 0335 11/30/14 0259  NA 135 133*  K 4.1 3.7  CL 104 102  CO2 25 23  GLUCOSE 121* 103*  BUN 12 12  CREATININE 1.21 1.33*  CALCIUM 7.9* 8.0*    Recent Labs  11/28/14 1121 11/29/14 0335  INR 1.47 1.31   No results for input(s): LABURIN in the last 72 hours. Results for orders placed or performed during the hospital encounter of 09/30/14  Urine culture     Status: None   Collection Time: 09/30/14  6:10 PM  Result Value Ref Range Status   Specimen Description URINE, CLEAN CATCH  Final   Special Requests NONE  Final   Culture   Final    NO GROWTH 2 DAYS Performed at [facility]    Report Status 10/02/2014 FINAL  Final    Studies/Results: Dg Chest Portable 1 View  11/28/2014   CLINICAL DATA:  Postoperative radiograph. Evaluate support  apparatus.  EXAM: PORTABLE CHEST 1 VIEW  COMPARISON:  10/05/2014.  FINDINGS: Support apparatus: Endotracheal tube tip 3.3 cm from the carina. RIGHT IJ central line present with the tip in the upper to mid SVC, just proximal to the carina. Enteric tube is present in the stomach. Surgical drain is present in the RIGHT upper quadrant of the abdomen near the deep sulcus sign.  Cardiomediastinal Silhouette:  Cardiomegaly is present.  Lungs: LEFT basilar atelectasis is present extending to the retrocardiac region. There is a deep sulcus sign on the RIGHT. In a supine patient, this likely represents a small pneumothorax even in the absence of a visible pleural line. The deep sulcus extends more inferiorly than new previously seen costophrenic angle on 10/05/2014 radiograph.  Effusions:  None visible.  Other:  None.  IMPRESSION: 1. Support apparatus in adequate position. 2. Deep sulcus sign on the RIGHT, suspicious for small RIGHT pneumothorax. Decubitus radiographs could be performed for further assessment. Potentially this could be artifactual due to gas along the liver margin in this patient with recent abdominal surgery. 3. Critical Value/emergent results were called by telephone at the time of interpretation on 11/28/2014 at 12:16 pm to Dr. Ola Spurr , who verbally acknowledged these results.   Electronically Signed   By: Dereck Ligas M.D.   On: 11/28/2014 12:16    Assessment/Plan: 65TI POD#1 cytoreductive nephrectomy and partial hepatectomy  1. D/c NGT 2. Continue NPO 3. Transfer to floor  MCKENZIE, PATRICK L 11/30/2014, 7:00 AM

## 2014-11-30 NOTE — Progress Notes (Signed)
First unit of 2 prbcs infusing as ordered

## 2014-11-30 NOTE — Progress Notes (Signed)
Dr Alyson Ingles, MD was notified about cxray result (10% pneumothorax). Pt is stable and asymptomatic. Will continue to monitor pt.Marland Kitchen

## 2014-11-30 NOTE — Progress Notes (Signed)
Patient ID: Derek [name], male   DOB: 1950-11-19, 64 y.o.   MRN: [id number] 2 Days Post-Op  Subjective: Pain controlled.  No n/v.  No dizziness.  +flatus.    Objective: Vital signs in last 24 hours: Temp:  [98.2 F (36.8 C)-101.2 F (38.4 C)] 99.2 F (37.3 C) (09/29 0648) Pulse Rate:  [84-105] 84 (09/29 0648) Resp:  [15-25] 17 (09/29 0648) BP: (125-179)/(57-85) 150/66 mmHg (09/29 0648) SpO2:  [91 %-100 %] 96 % (09/29 0648) Weight:  [88.4 kg (194 lb 14.2 oz)] 88.4 kg (194 lb 14.2 oz) (09/29 0500)    Intake/Output from previous day: 09/28 0701 - 09/29 0700 In: 5027 [I.V.:1375] Out: 1290 [Urine:1095; Emesis/NG output:10; Drains:185] Intake/Output this shift:    General appearance: alert, cooperative and no distress Resp: breathing comfortably Cardio: regular rate and rhythm GI: soft, non distended, approp tender.  Drain serosang.    Lab Results:   Recent Labs  11/29/14 0335 11/30/14 0259  WBC 7.1 7.4  HGB 8.5* 7.3*  HCT 26.2* 22.5*  PLT 205 176   BMET  Recent Labs  11/29/14 0335 11/30/14 0259  NA 135 133*  K 4.1 3.7  CL 104 102  CO2 25 23  GLUCOSE 121* 103*  BUN 12 12  CREATININE 1.21 1.33*  CALCIUM 7.9* 8.0*   PT/INR  Recent Labs  11/28/14 1121 11/29/14 0335  LABPROT 17.9* 16.4*  INR 1.47 1.31   ABG  Recent Labs  11/28/14 1023 11/28/14 1119  PHART 7.426 7.408  HCO3 25.0* 24.2*    Studies/Results: Dg Chest 2 View  11/30/2014   CLINICAL DATA:  Shortness of breath, pneumothorax, status post nephrectomy.  EXAM: CHEST  2 VIEW  COMPARISON:  Portable chest x-ray of November 28, 2014  FINDINGS: There has been interval extubation of the trachea and of the esophagus. There remains an approximately 10% right-sided pneumothorax. The deep sulcus sign is not clearly evident today. The surgical drain line in the right upper quadrant of the abdomen is unchanged. The left lung is adequately inflated. The interstitial markings remain coarse. The cardiac silhouette  remains enlarged. There are 7 intact sternal wires from previous CABG. The right internal jugular venous catheter tip projects over the midportion of the SVC.  IMPRESSION: 1. Persistent 10% or less right-sided pneumothorax. There is right basilar atelectasis. 2. Mild interstitial prominence of the left lung coupled with mild pulmonary vascular prominence consistent withlow-grade interstitial edema. 3. Surgical drain line visible in the right upper quadrant of the abdomen. 4. These results were called by telephone at the time of interpretation on 11/30/2014 at 7:30 am to Arman Filter, RN,, who verbally acknowledged these results.   Electronically Signed   By: David  Martinique M.D.   On: 11/30/2014 07:36   Dg Chest Portable 1 View  11/28/2014   CLINICAL DATA:  Postoperative radiograph. Evaluate support apparatus.  EXAM: PORTABLE CHEST 1 VIEW  COMPARISON:  10/05/2014.  FINDINGS: Support apparatus: Endotracheal tube tip 3.3 cm from the carina. RIGHT IJ central line present with the tip in the upper to mid SVC, just proximal to the carina. Enteric tube is present in the stomach. Surgical drain is present in the RIGHT upper quadrant of the abdomen near the deep sulcus sign.  Cardiomediastinal Silhouette:  Cardiomegaly is present.  Lungs: LEFT basilar atelectasis is present extending to the retrocardiac region. There is a deep sulcus sign on the RIGHT. In a supine patient, this likely represents a small pneumothorax even in the absence of a visible pleural  line. The deep sulcus extends more inferiorly than new previously seen costophrenic angle on 10/05/2014 radiograph.  Effusions:  None visible.  Other:  None.  IMPRESSION: 1. Support apparatus in adequate position. 2. Deep sulcus sign on the RIGHT, suspicious for small RIGHT pneumothorax. Decubitus radiographs could be performed for further assessment. Potentially this could be artifactual due to gas along the liver margin in this patient with recent abdominal surgery. 3.  Critical Value/emergent results were called by telephone at the time of interpretation on 11/28/2014 at 12:16 pm to Dr. Ola Spurr , who verbally acknowledged these results.   Electronically Signed   By: Dereck Ligas M.D.   On: 11/28/2014 12:16    Anti-infectives: Anti-infectives    Start     Dose/Rate Route Frequency Ordered Stop   11/28/14 2000  ciprofloxacin (CIPRO) IVPB 400 mg     400 mg 200 mL/hr over 60 Minutes Intravenous Every 12 hours 11/28/14 1440 11/28/14 2024   11/28/14 0600  ciprofloxacin (CIPRO) IVPB 400 mg     400 mg 200 mL/hr over 60 Minutes Intravenous To ShortStay Surgical 11/27/14 1310 11/28/14 0830      Assessment/Plan: s/p Procedure(s): OPEN PARTIAL HEPATECTOMY (N/A) RIGHT OPEN NEPHRECTOMY (Right) Clear liquid diet Decrease IVF. Transfuse p RBCs   LOS: 2 days    [facility] 11/30/2014

## 2014-12-01 ENCOUNTER — Ambulatory Visit: Payer: Medicare Other | Admitting: Oncology

## 2014-12-01 ENCOUNTER — Other Ambulatory Visit: Payer: Medicare Other

## 2014-12-01 LAB — COMPREHENSIVE METABOLIC PANEL
ALBUMIN: 2.3 g/dL — AB (ref 3.5–5.0)
ALT: 88 U/L — ABNORMAL HIGH (ref 17–63)
AST: 38 U/L (ref 15–41)
Alkaline Phosphatase: 52 U/L (ref 38–126)
Anion gap: 7 (ref 5–15)
BILIRUBIN TOTAL: 0.9 mg/dL (ref 0.3–1.2)
BUN: 9 mg/dL (ref 6–20)
CHLORIDE: 103 mmol/L (ref 101–111)
CO2: 24 mmol/L (ref 22–32)
Calcium: 8.2 mg/dL — ABNORMAL LOW (ref 8.9–10.3)
Creatinine, Ser: 1.19 mg/dL (ref 0.61–1.24)
GFR calc Af Amer: >60 mL/min (ref >60)
GFR calc non Af Amer: >60 mL/min (ref >60)
GLUCOSE: 111 mg/dL — AB (ref 65–99)
POTASSIUM: 3.7 mmol/L (ref 3.5–5.1)
Sodium: 134 mmol/L — ABNORMAL LOW (ref 135–145)
TOTAL PROTEIN: 5.2 g/dL — AB (ref 6.5–8.1)

## 2014-12-01 LAB — CBC
HEMATOCRIT: 26.3 % — AB (ref 39.0–52.0)
Hemoglobin: 8.6 g/dL — ABNORMAL LOW (ref 13.0–17.0)
MCH: 27.7 pg (ref 26.0–34.0)
MCHC: 32.7 g/dL (ref 30.0–36.0)
MCV: 84.8 fL (ref 78.0–100.0)
Platelets: 173 10*3/uL (ref 150–400)
RBC: 3.1 MIL/uL — ABNORMAL LOW (ref 4.22–5.81)
RDW: 14.7 % (ref 11.5–15.5)
WBC: 7 10*3/uL (ref 4.0–10.5)

## 2014-12-01 MED ORDER — SODIUM CHLORIDE 0.9 % IJ SOLN: 10.0000 mL | INTRAMUSCULAR | Status: DC | PRN

## 2014-12-01 MED ORDER — OXYCODONE HCL 5 MG PO TABS: 5.0000 mg | ORAL_TABLET | ORAL | Status: DC | PRN

## 2014-12-01 MED ORDER — DOCUSATE SODIUM 100 MG PO CAPS: 100.0000 mg | ORAL_CAPSULE | Freq: Two times a day (BID) | ORAL | Status: AC

## 2014-12-01 MED ORDER — PANTOPRAZOLE SODIUM 40 MG PO TBEC
40.0000 mg | DELAYED_RELEASE_TABLET | Freq: Every day | ORAL | Status: DC
  Administered 2014-12-01: 40 mg via ORAL
  Filled 2014-12-01: qty 1

## 2014-12-01 MED ORDER — SODIUM CHLORIDE 0.9 % IJ SOLN
10.0000 mL | Freq: Two times a day (BID) | INTRAMUSCULAR | Status: DC
  Administered 2014-12-01: 30 mL

## 2014-12-01 MED ORDER — OXYCODONE HCL 5 MG PO TABS
5.0000 mg | ORAL_TABLET | ORAL | Status: DC | PRN
  Administered 2014-12-01 – 2014-12-02 (×7): 10 mg via ORAL
  Filled 2014-12-01 (×7): qty 2

## 2014-12-01 NOTE — Progress Notes (Signed)
POD#3 Subjective: Patient reports pain control good. Passing flatus. Ambulating in hlls.  Objective: Vital signs in last 24 hours: Temp:  [98.6 F (37 C)-99.9 F (37.7 C)] 99.9 F (37.7 C) (09/30 2220) Pulse Rate:  [74-86] 80 (09/30 2220) Resp:  [18-21] 18 (09/30 2220) BP: (119-159)/(58-76) 139/67 mmHg (09/30 2220) SpO2:  [89 %-99 %] 97 % (09/30 2226) FiO2 (%):  [96 %] 96 % (09/30 0341) Weight:  [95.573 kg (210 lb 11.2 oz)] 95.573 kg (210 lb 11.2 oz) (09/30 0458)  Intake/Output from previous day: 09/29 0701 - 09/30 0700 In: 1723 [P.O.:120; I.V.:905; Blood:698] Out: 83 [Urine:500; Drains:180] Intake/Output this shift: Total I/O In: 71 [I.V.:30] Out: -   Physical Exam:  General:alert, cooperative and appears stated age GI: soft and distended Male genitalia: no penile lesions or discharge no testicular masses no bladder distension noted Resp: clear to auscultation bilaterally Extremities: extremities normal, atraumatic, no cyanosis or edema  Lab Results:  Recent Labs  11/30/14 0259 11/30/14 2000 12/01/14 0320  HGB 7.3* 8.6* 8.6*  HCT 22.5* 26.7* 26.3*   BMET  Recent Labs  11/30/14 0259 12/01/14 0320  NA 133* 134*  K 3.7 3.7  CL 102 103  CO2 23 24  GLUCOSE 103* 111*  BUN 12 9  CREATININE 1.33* 1.19  CALCIUM 8.0* 8.2*    Recent Labs  11/29/14 0335  INR 1.31   No results for input(s): LABURIN in the last 72 hours. Results for orders placed or performed during the hospital encounter of 09/30/14  Urine culture     Status: None   Collection Time: 09/30/14  6:10 PM  Result Value Ref Range Status   Specimen Description URINE, CLEAN CATCH  Final   Special Requests NONE  Final   Culture   Final    NO GROWTH 2 DAYS Performed at [facility]    Report Status 10/02/2014 FINAL  Final    Studies/Results: Dg Chest 2 View  11/30/2014   CLINICAL DATA:  Shortness of breath, pneumothorax, status post nephrectomy.  EXAM: CHEST  2 VIEW  COMPARISON:   Portable chest x-ray of November 28, 2014  FINDINGS: There has been interval extubation of the trachea and of the esophagus. There remains an approximately 10% right-sided pneumothorax. The deep sulcus sign is not clearly evident today. The surgical drain line in the right upper quadrant of the abdomen is unchanged. The left lung is adequately inflated. The interstitial markings remain coarse. The cardiac silhouette remains enlarged. There are 7 intact sternal wires from previous CABG. The right internal jugular venous catheter tip projects over the midportion of the SVC.  IMPRESSION: 1. Persistent 10% or less right-sided pneumothorax. There is right basilar atelectasis. 2. Mild interstitial prominence of the left lung coupled with mild pulmonary vascular prominence consistent withlow-grade interstitial edema. 3. Surgical drain line visible in the right upper quadrant of the abdomen. 4. These results were called by telephone at the time of interpretation on 11/30/2014 at 7:30 am to Arman Filter, RN,, who verbally acknowledged these results.   Electronically Signed   By: David  Martinique M.D.   On: 11/30/2014 07:36    Assessment/Plan: 89ME POD#3 cytoreductive nephrectomy and partial hepatectomy  1. Regular diet 2. Saline lock IVF 3. PCA d/c, oxycodone prn pain 4. Likely discharge tomorrow    MCKENZIE, PATRICK L 12/01/2014, 11:15 PM

## 2014-12-01 NOTE — Care Management Important Message (Signed)
Important Message  Patient Details  Name: Derek [name] MRN: [id number] Date of Birth: [birth year]   Medicare Important Message Given:  Yes-second notification given    Delorse Lek 12/01/2014, 3:18 PM

## 2014-12-01 NOTE — Progress Notes (Signed)
Patient ID: Derek [name], male   DOB: 07/12/1950, 64 y.o.   MRN: [id number] 3 Days Post-Op   Subjective: Tolerated clears.  Having flatus.  Pain controlled.     Objective: Vital signs in last 24 hours: Temp:  [98.6 F (37 C)-100.6 F (38.1 C)] 99.1 F (37.3 C) (09/30 0458) Pulse Rate:  [74-85] 82 (09/30 0834) Resp:  [16-21] 21 (09/30 0458) BP: (135-159)/(66-76) 158/76 mmHg (09/30 0834) SpO2:  [93 %-99 %] 99 % (09/30 0458) FiO2 (%):  [93 %-96 %] 96 % (09/30 0341) Weight:  [95.573 kg (210 lb 11.2 oz)] 95.573 kg (210 lb 11.2 oz) (09/30 0458) Last BM Date: 11/27/14  Intake/Output from previous day: 09/29 0701 - 09/30 0700 In: 1723 [P.O.:120; I.V.:905; Blood:698] Out: 680 [Urine:500; Drains:180] Intake/Output this shift: Total I/O In: 543.8 [P.O.:360; I.V.:183.8] Out: 885 [Urine:775; Drains:110]  General appearance: alert, cooperative and no distress Resp: breathing comfortably Cardio: regular rate and rhythm GI: soft, non distended, approp tender.  Drain serosang. Non bilious.     Lab Results:   Recent Labs  11/30/14 0259 11/30/14 2000 12/01/14 0320  WBC 7.4  --  7.0  HGB 7.3* 8.6* 8.6*  HCT 22.5* 26.7* 26.3*  PLT 176  --  173   BMET  Recent Labs  11/30/14 0259 12/01/14 0320  NA 133* 134*  K 3.7 3.7  CL 102 103  CO2 23 24  GLUCOSE 103* 111*  BUN 12 9  CREATININE 1.33* 1.19  CALCIUM 8.0* 8.2*   PT/INR  Recent Labs  11/29/14 0335  LABPROT 16.4*  INR 1.31   ABG No results for input(s): PHART, HCO3 in the last 72 hours.  Invalid input(s): PCO2, PO2  Studies/Results: Dg Chest 2 View  11/30/2014   CLINICAL DATA:  Shortness of breath, pneumothorax, status post nephrectomy.  EXAM: CHEST  2 VIEW  COMPARISON:  Portable chest x-ray of November 28, 2014  FINDINGS: There has been interval extubation of the trachea and of the esophagus. There remains an approximately 10% right-sided pneumothorax. The deep sulcus sign is not clearly evident today. The surgical  drain line in the right upper quadrant of the abdomen is unchanged. The left lung is adequately inflated. The interstitial markings remain coarse. The cardiac silhouette remains enlarged. There are 7 intact sternal wires from previous CABG. The right internal jugular venous catheter tip projects over the midportion of the SVC.  IMPRESSION: 1. Persistent 10% or less right-sided pneumothorax. There is right basilar atelectasis. 2. Mild interstitial prominence of the left lung coupled with mild pulmonary vascular prominence consistent withlow-grade interstitial edema. 3. Surgical drain line visible in the right upper quadrant of the abdomen. 4. These results were called by telephone at the time of interpretation on 11/30/2014 at 7:30 am to Arman Filter, RN,, who verbally acknowledged these results.   Electronically Signed   By: David  Martinique M.D.   On: 11/30/2014 07:36    Anti-infectives: Anti-infectives    Start     Dose/Rate Route Frequency Ordered Stop   11/28/14 2000  ciprofloxacin (CIPRO) IVPB 400 mg     400 mg 200 mL/hr over 60 Minutes Intravenous Every 12 hours 11/28/14 1440 11/28/14 2024   11/28/14 0600  ciprofloxacin (CIPRO) IVPB 400 mg     400 mg 200 mL/hr over 60 Minutes Intravenous To ShortStay Surgical 11/27/14 1310 11/28/14 0830      Assessment/Plan: s/p Procedure(s): OPEN PARTIAL HEPATECTOMY (N/A) RIGHT OPEN NEPHRECTOMY (Right) Advance diet D/c pca Oral pain meds Hope for d/c tomorrow.  LOS: 3 days    BYERLY,FAERA 12/01/2014

## 2014-12-02 DIAGNOSIS — D649 Anemia, unspecified: Secondary | ICD-10-CM | POA: Diagnosis not present

## 2014-12-02 LAB — TYPE AND SCREEN
ABO/RH(D): AB POS
Antibody Screen: NEGATIVE
UNIT DIVISION: 0
UNIT DIVISION: 0
UNIT DIVISION: 0
Unit division: 0
Unit division: 0
Unit division: 0
Unit division: 0
Unit division: 0

## 2014-12-02 LAB — COMPREHENSIVE METABOLIC PANEL
ALBUMIN: 2.1 g/dL — AB (ref 3.5–5.0)
ALK PHOS: 63 U/L (ref 38–126)
ALT: 57 U/L (ref 17–63)
AST: 23 U/L (ref 15–41)
Anion gap: 4 — ABNORMAL LOW (ref 5–15)
BUN: 8 mg/dL (ref 6–20)
CALCIUM: 8.3 mg/dL — AB (ref 8.9–10.3)
CHLORIDE: 103 mmol/L (ref 101–111)
CO2: 28 mmol/L (ref 22–32)
CREATININE: 1.18 mg/dL (ref 0.61–1.24)
GFR calc Af Amer: >60 mL/min (ref >60)
GFR calc non Af Amer: >60 mL/min (ref >60)
GLUCOSE: 108 mg/dL — AB (ref 65–99)
Potassium: 3.7 mmol/L (ref 3.5–5.1)
SODIUM: 135 mmol/L (ref 135–145)
Total Bilirubin: 0.8 mg/dL (ref 0.3–1.2)
Total Protein: 5 g/dL — ABNORMAL LOW (ref 6.5–8.1)

## 2014-12-02 LAB — CBC
HCT: 24.7 % — ABNORMAL LOW (ref 39.0–52.0)
HEMOGLOBIN: 8.1 g/dL — AB (ref 13.0–17.0)
MCH: 27.6 pg (ref 26.0–34.0)
MCHC: 32.8 g/dL (ref 30.0–36.0)
MCV: 84.3 fL (ref 78.0–100.0)
Platelets: 212 10*3/uL (ref 150–400)
RBC: 2.93 MIL/uL — AB (ref 4.22–5.81)
RDW: 14.7 % (ref 11.5–15.5)
WBC: 5.1 10*3/uL (ref 4.0–10.5)

## 2014-12-02 MED ORDER — POLYETHYLENE GLYCOL 3350 17 G PO PACK
17.0000 g | PACK | Freq: Once | ORAL | Status: AC
  Administered 2014-12-02: 17 g via ORAL
  Filled 2014-12-02: qty 1

## 2014-12-02 NOTE — Discharge Summary (Signed)
Physician Discharge Summary  Patient ID: Derek [name] MRN: [id number] DOB/AGE: Feb 08, 1951 64 y.o.  Admit date: 11/28/2014 Discharge date: 12/02/2014  Admission Diagnoses:  Renal cell carcinoma [facility])  Discharge Diagnoses:  Principal Problem:   Renal cell carcinoma Active Problems:   Metastatic renal cell carcinoma   Postoperative anemia due to acute blood loss   Past Medical History  Diagnosis Date  . Hypertension   . Gout   . NSTEMI (non-ST elevated myocardial infarction)   . Arthritis   . Shingles 7/15  . Renal cell carcinoma 11/28/2014    Surgeries: Procedure(s): OPEN PARTIAL HEPATECTOMY RIGHT OPEN NEPHRECTOMY on 11/28/2014   Consultants (if any):    Discharged Condition: Improved  Hospital Course: Derek [name] is an 64 y.o. male who was admitted 11/28/2014 with a diagnosis of Renal cell carcinoma ([place]) with metastasis to the liver and went to the operating room on 11/28/2014 and underwent the above named procedures.  He has done well post-operatively and is felt to be ready for discharge.  He is tolerating a regular diet and passing gas.  The drain will be left in per Dr. Dalbert Batman.  He has stable edema in the RLE that pre-dated the surgical procedure.  His hgb is 8.1 this morning which is minimally declined since 9/29.  Exam: BP 147/79 mmHg  Pulse 70  Temp(Src) 99.5 F (37.5 C) (Oral)  Resp 18  Ht 5\' 8"  (1.727 m)  Wt 91.4 kg (201 lb 8 oz)  BMI 30.65 kg/m2  SpO2 98%  Gen  WD, WN in NAD. Lungs CTA CV RRR GI soft, obese with +BS, minimal tenderness and an intact RUQ incision.   JP in place.\ Ext RLE edema without tenderness.   He was given perioperative antibiotics:      Anti-infectives    Start     Dose/Rate Route Frequency Ordered Stop   11/28/14 2000  ciprofloxacin (CIPRO) IVPB 400 mg     400 mg 200 mL/hr over 60 Minutes Intravenous Every 12 hours 11/28/14 1440 11/28/14 2024   11/28/14 0600  ciprofloxacin (CIPRO) IVPB 400 mg     400 mg 200 mL/hr over 60 Minutes  Intravenous To ShortStay Surgical 11/27/14 1310 11/28/14 0830    .  He was given sequential compression devices for DVT prophylaxis.  He benefited maximally from the hospital stay and there were no complications.    Recent vital signs:  Filed Vitals:   12/02/14 0500  BP: 147/79  Pulse: 70  Temp: 99.5 F (37.5 C)  Resp: 18    Recent laboratory studies:  Lab Results  Component Value Date   HGB 8.1* 12/02/2014   HGB 8.6* 12/01/2014   HGB 8.6* 11/30/2014   Lab Results  Component Value Date   WBC 5.1 12/02/2014   PLT 212 12/02/2014   Lab Results  Component Value Date   INR 1.31 11/29/2014   Lab Results  Component Value Date   NA 135 12/02/2014   K 3.7 12/02/2014   CL 103 12/02/2014   CO2 28 12/02/2014   BUN 8 12/02/2014   CREATININE 1.18 12/02/2014   GLUCOSE 108* 12/02/2014    Discharge Medications:     Medication List    TAKE these medications        aspirin EC 81 MG tablet  Take 81 mg by mouth daily.     carvedilol 3.125 MG tablet  Commonly known as:  COREG  Take 1 tablet (3.125 mg total) by mouth 2 (two) times daily with a meal.  docusate sodium 100 MG capsule  Commonly known as:  COLACE  Take 1 capsule (100 mg total) by mouth 2 (two) times daily.     furosemide 20 MG tablet  Commonly known as:  LASIX  Take 20 mg by mouth daily as needed for fluid.     lisinopril 10 MG tablet  Commonly known as:  PRINIVIL,ZESTRIL  Take 1 tablet (10 mg total) by mouth 2 (two) times daily.     multivitamin capsule  Take 1 capsule by mouth daily.     oxyCODONE 5 MG immediate release tablet  Commonly known as:  Oxy IR/ROXICODONE  Take 1-2 tablets (5-10 mg total) by mouth every 4 (four) hours as needed for moderate pain, severe pain or breakthrough pain.     potassium chloride SA 20 MEQ tablet  Commonly known as:  K-DUR,KLOR-CON  Take 1 tablet (20 mEq total) by mouth daily.     Turmeric 500 MG Caps  Take 500 mg by mouth daily.     Vitamin D-3 5000 UNITS  Tabs  Take 5,000 Units by mouth daily.        Diagnostic Studies: Dg Chest 2 View  11/30/2014   CLINICAL DATA:  Shortness of breath, pneumothorax, status post nephrectomy.  EXAM: CHEST  2 VIEW  COMPARISON:  Portable chest x-ray of November 28, 2014  FINDINGS: There has been interval extubation of the trachea and of the esophagus. There remains an approximately 10% right-sided pneumothorax. The deep sulcus sign is not clearly evident today. The surgical drain line in the right upper quadrant of the abdomen is unchanged. The left lung is adequately inflated. The interstitial markings remain coarse. The cardiac silhouette remains enlarged. There are 7 intact sternal wires from previous CABG. The right internal jugular venous catheter tip projects over the midportion of the SVC.  IMPRESSION: 1. Persistent 10% or less right-sided pneumothorax. There is right basilar atelectasis. 2. Mild interstitial prominence of the left lung coupled with mild pulmonary vascular prominence consistent withlow-grade interstitial edema. 3. Surgical drain line visible in the right upper quadrant of the abdomen. 4. These results were called by telephone at the time of interpretation on 11/30/2014 at 7:30 am to Derek Filter, RN,, who verbally acknowledged these results.   Electronically Signed   By: David  Martinique M.D.   On: 11/30/2014 07:36   Dg Chest Portable 1 View  11/28/2014   CLINICAL DATA:  Postoperative radiograph. Evaluate support apparatus.  EXAM: PORTABLE CHEST 1 VIEW  COMPARISON:  10/05/2014.  FINDINGS: Support apparatus: Endotracheal tube tip 3.3 cm from the carina. RIGHT IJ central line present with the tip in the upper to mid SVC, just proximal to the carina. Enteric tube is present in the stomach. Surgical drain is present in the RIGHT upper quadrant of the abdomen near the deep sulcus sign.  Cardiomediastinal Silhouette:  Cardiomegaly is present.  Lungs: LEFT basilar atelectasis is present extending to the retrocardiac  region. There is a deep sulcus sign on the RIGHT. In a supine patient, this likely represents a small pneumothorax even in the absence of a visible pleural line. The deep sulcus extends more inferiorly than new previously seen costophrenic angle on 10/05/2014 radiograph.  Effusions:  None visible.  Other:  None.  IMPRESSION: 1. Support apparatus in adequate position. 2. Deep sulcus sign on the RIGHT, suspicious for small RIGHT pneumothorax. Decubitus radiographs could be performed for further assessment. Potentially this could be artifactual due to gas along the liver margin in this patient with  recent abdominal surgery. 3. Critical Value/emergent results were called by telephone at the time of interpretation on 11/28/2014 at 12:16 pm to Dr. Ola Spurr , who verbally acknowledged these results.   Electronically Signed   By: Dereck Ligas M.D.   On: 11/28/2014 12:16    Disposition: 01-Home or Self Care  Discharge Instructions    Discharge patient    Complete by:  As directed   To home     Discontinue IV    Complete by:  As directed   Both central line and IV           Follow-up Information    Follow up with [facility], MD In 2 weeks.   Specialty:  General Surgery   Contact information:   [address] [place] [address] [phone]       Follow up with [facility], PATRICK L, MD. Call in 10 days.   Specialty:  Urology   Why:  staple removal   Contact information:   [place] [address] [phone]       Follow up with [facility], MD. Schedule an appointment as soon as possible for a visit in 1 week.   Specialty:  General Surgery   Contact information:   [address] [place] [address] [phone]        Signed: Malka So 12/02/2014, 11:27 AM

## 2014-12-02 NOTE — Discharge Instructions (Signed)
Nephrectomy A nephrectomy is a surgical procedure to remove a kidney. LET YOUR CAREGIVER KNOW ABOUT:   Allergies to food or medicine.  Medicines taken, including vitamins, supplements, herbs, eye drops, over-the-counter medicines, and creams.  Use of steroids (creams, nasal sprays, or pills).  Previous problems with numbing medicines.  History of bleeding problems or blood clots.  Previous surgery.  Other health problems, including diabetes and kidney problems.  Possibility of pregnancy, if this applies. RISKS AND COMPLICATIONS   Reaction to anesthesia.  Problems breathing.  Injury to other organs.  Bleeding.  Infection. BEFORE THE PROCEDURE   Your caregivers will review the procedure, the anesthesia being used, and what to expect after the procedure with you. It may be helpful to have a partner or family member listen to the surgeon with you.  Ask your caregiver about changing or stopping your regular medications.  Stop smoking if you smoke. You may be advised to undergo breathing procedures to prepare for your surgery. Your caregiver will schedule these treatments for you. PROCEDURE   Kidney removal may be performed as open surgery, laparoscopic surgery, or robotic surgery.  An open surgery involves making a large surgical cut (incision) in your side.  A laparoscopic surgery involves using a thin, lighted tube (laparoscope) to see the kidney through 3 or 4 small incisions in the abdomen.  A robotic surgery involves specialized equipment that allows the surgeon to further optimize what he or she can see and subsequent removal of the kidney.  You will be given a medicine that makes you sleep (general anesthesia).  The surgeon may need to remove a rib to perform the procedure.  The ureter and blood vessels are cut away from the kidney and the kidney is removed.  The incision is then closed. AFTER THE PROCEDURE   It is normal to be sore for a couple of weeks  following surgery.  Recovery will vary depending on the surgical approach used.  Open surgery will often require 4 to 6 weeks of progressive activity to allow the incision to heal properly.  Laparoscopic surgery will require significantly less healing time, usually 7 to 10 days before light activity can be resumed.  Robotic surgery will generally require the same healing time as laparoscopic surgery. Document Released: 10/11/2003 Document Revised: 07/04/2013 Document Reviewed: 04/21/2007 [facility] Patient Information 2015 [place], [place]. This information is not intended to replace advice given to you by your health care provider. Make sure you discuss any questions you have with your health care provider.     Gen. Surgery:  We will leave the drain in for one week Keep a written record of the drainage and bring that to the office when you see Dr. Barry Dienes Give [place] a call if the drainage turns from pink to green.  Derek [name]. Dalbert Batman, M.D., [facility] General and Minimally invasive Surgery Breast and Colorectal Surgery Office:   [phone]  [place] A bulb drain consists of a thin rubber tube and a soft, round bulb that creates a gentle suction. The rubber tube is placed in the area where you had surgery. A bulb is attached to the end of the tube that is outside the body. The bulb drain removes excess fluid that normally builds up in a surgical wound after surgery. The color and amount of fluid will vary. Immediately after surgery, the fluid is bright red and is a little thicker than water. It may gradually change to a yellow or pink  color and become more thin and water-like. When the amount decreases to about 1 or 2 tbsp in 24 hours, your health care provider will usually remove it. DAILY CARE  Keep the bulb flat (compressed) at all times, except while emptying it. The flatness creates suction. You can flatten the bulb by squeezing it firmly in the  middle and then closing the cap.  Keep sites where the tube enters the skin dry and covered with a bandage (dressing).  Secure the tube 1-2 in (2.5-5.1 cm) below the insertion sites to keep it from pulling on your stitches. The tube is stitched in place and will not slip out.  Secure the bulb as directed by your health care provider.  For the first 3 days after surgery, there usually is more fluid in the bulb. Empty the bulb whenever it becomes half full because the bulb does not create enough suction if it is too full. The bulb could also overflow. Write down how much fluid you remove each time you empty your drain. Add up the amount removed in 24 hours.  Empty the bulb at the same time every day once the amount of fluid decreases and you only need to empty it once a day. Write down the amounts and the 24-hour totals to give to your health care provider. This helps your health care provider know when the tubes can be removed. EMPTYING THE BULB DRAIN Before emptying the bulb, get a measuring cup, a piece of paper and a pen, and wash your hands.  Gently run your fingers down the tube (stripping) to empty any drainage from the tubing into the bulb. This may need to be done several times a day to clear the tubing of clots and tissue.  Open the bulb cap to release suction, which causes it to inflate. Do not touch the inside of the cap.  Gently run your fingers down the tube (stripping) to empty any drainage from the tubing into the bulb.  Hold the cap out of the way, and pour fluid into the measuring cup.   Squeeze the bulb to provide suction.  Replace the cap.   Check the tape that holds the tube to your skin. If it is becoming loose, you can remove the loose piece of tape and apply a new one. Then, pin the bulb to your shirt.   Write down the amount of fluid you emptied out. Write down the date and each time you emptied your bulb drain. (If there are 2 bulbs, note the amount of drainage  from each bulb and keep the totals separate. Your health care provider will want to know the total amounts for each drain and which tube is draining more.)   Flush the fluid down the toilet and wash your hands.   Call your health care provider once you have less than 2 tbsp of fluid collecting in the bulb drain every 24 hours. If there is drainage around the tube site, change dressings and keep the area dry. Cleanse around tube with sterile saline and place dry gauze around site. This gauze should be changed when it is soiled. If it stays clean and unsoiled, it should still be changed daily.  SEEK MEDICAL CARE IF:  Your drainage has a bad smell or is cloudy.   You have a fever.   Your drainage is increasing instead of decreasing.   Your tube fell out.   You have redness or swelling around the tube site.   You  have drainage from a surgical wound.   Your bulb drain will not stay flat after you empty it.  MAKE SURE YOU:   Understand these instructions.  Will watch your condition.  Will get help right away if you are not doing well or get worse. Document Released: 02/15/2000 Document Revised: 07/04/2013 Document Reviewed: 07/23/2011 [facility] Patient Information 2015 [place], [place]. This information is not intended to replace advice given to you by your health care provider. Make sure you discuss any questions you have with your health care provider. Nephrectomy Care After Refer to this sheet in the next few weeks. These instructions provide you with information on caring for yourself after your procedure. Your caregiver may also give you more specific instructions. Your treatment has been planned according to current medical practices, but problems sometimes occur. Call your caregiver if you have any problems or questions after your procedure. HOME CARE INSTRUCTIONS   Only take over-the-counter or prescription medicines for pain, discomfort, or fever as directed by your  caregiver.  Take showers, not baths, until seen by your caregiver.  Change bandages (dressings) if necessary or as directed.  Resume your normal diet and activities as directed.  Avoid lifting more than 10 lb (4.5 kg) or driving until directed otherwise. If you must lift something, avoid bending over at the waist.  Make an appointment to see your caregiver for stitch (suture) or staple removal as directed.  Keep all follow-up appointments as directed.  Monitor your blood pressure as directed. SEEK MEDICAL CARE IF:   You have increased bleeding from the wound.  Your soreness gets worse instead of better.  You see redness, swelling, or have increasing pain in the wound.  You have yellowish white fluid (pus) coming from your wound.  You develop an unexplained temperature over 102 F (38.9 C).  You have a bad smell coming from the wound or dressing. SEEK IMMEDIATE MEDICAL CARE IF:   You develop a rash.  You have difficulty breathing or feel short of breath.  You develop any reaction or side effects to medications given.  You develop lightheadedness or feel faint.  You develop blood or clots in your urine and it is increasing.  You have clear fluid coming from the wound. Document Released: 09/06/2004 Document Revised: 05/12/2011 Document Reviewed: 01/14/2007 [facility] Patient Information 2015 [place], [place]. This information is not intended to replace advice given to you by your health care provider. Make sure you discuss any questions you have with your health care provider.

## 2014-12-02 NOTE — Progress Notes (Signed)
4 Days Post-Op  Subjective: Doing well.  Wants to go home. Stable and alert Tolerating diet.  No bowel movement yet.  Will give 1 dose of MiraLAX this morning. I discussed drain care with him.  I told him we would keep it in for about a week and follow-up with Dr. Barry Dienes.  Met with nursing and they're going to educate him about this.  Objective: Vital signs in last 24 hours: Temp:  [99.5 F (37.5 C)-99.9 F (37.7 C)] 99.5 F (37.5 C) (10/01 0500) Pulse Rate:  [70-86] 70 (10/01 0500) Resp:  [18] 18 (10/01 0500) BP: (119-158)/(58-79) 147/79 mmHg (10/01 0500) SpO2:  [89 %-100 %] 98 % (10/01 0511) Weight:  [91.4 kg (201 lb 8 oz)] 91.4 kg (201 lb 8 oz) (10/01 0500) Last BM Date: 11/27/14  Intake/Output from previous day: 09/30 0701 - 10/01 0700 In: 1653.8 [P.O.:1440; I.V.:213.8] Out: 1470 [Urine:1175; Drains:295] Intake/Output this shift:      EXAM: General appearance: Alert.  Cooperative.  No distress.  Mental status normal. Resp: clear to auscultation bilaterally GI: Soft.  Nondistended.  Minimally tender.  Right subcostal incision looks clean.  Drainage is serosanguineous.  Nonbilious.  Drain care discussed.  Lab Results:  Results for orders placed or performed during the hospital encounter of 11/28/14 (from the past 24 hour(s))  CBC     Status: Abnormal   Collection Time: 12/02/14  4:50 AM  Result Value Ref Range   WBC 5.1 4.0 - 10.5 K/uL   RBC 2.93 (L) 4.22 - 5.81 MIL/uL   Hemoglobin 8.1 (L) 13.0 - 17.0 g/dL   HCT 24.7 (L) 39.0 - 52.0 %   MCV 84.3 78.0 - 100.0 fL   MCH 27.6 26.0 - 34.0 pg   MCHC 32.8 30.0 - 36.0 g/dL   RDW 14.7 11.5 - 15.5 %   Platelets 212 150 - 400 K/uL  Comprehensive metabolic panel     Status: Abnormal   Collection Time: 12/02/14  4:50 AM  Result Value Ref Range   Sodium 135 135 - 145 mmol/L   Potassium 3.7 3.5 - 5.1 mmol/L   Chloride 103 101 - 111 mmol/L   CO2 28 22 - 32 mmol/L   Glucose, Bld 108 (H) 65 - 99 mg/dL   BUN 8 6 - 20 mg/dL   Creatinine, Ser 1.18 0.61 - 1.24 mg/dL   Calcium 8.3 (L) 8.9 - 10.3 mg/dL   Total Protein 5.0 (L) 6.5 - 8.1 g/dL   Albumin 2.1 (L) 3.5 - 5.0 g/dL   AST 23 15 - 41 U/L   ALT 57 17 - 63 U/L   Alkaline Phosphatase 63 38 - 126 U/L   Total Bilirubin 0.8 0.3 - 1.2 mg/dL   GFR calc non Af Amer >60 >60 mL/min   GFR calc Af Amer >60 >60 mL/min   Anion gap 4 (L) 5 - 15     Studies/Results: No results found.  . sodium chloride   Intravenous Once  . antiseptic oral rinse  7 mL Mouth Rinse q12n4p  . aspirin EC  81 mg Oral Daily  . carvedilol  3.125 mg Oral BID WC  . chlorhexidine  15 mL Mouth Rinse BID  . docusate sodium  100 mg Oral BID  . heparin  5,000 Units Subcutaneous 3 times per day  . pantoprazole  40 mg Oral QHS  . polyethylene glycol  17 g Oral Once  . sodium chloride  10-40 mL Intracatheter Q12H     Assessment/Plan: s/p  Procedure(s): OPEN PARTIAL HEPATECTOMY RIGHT OPEN NEPHRECTOMY  Okay for discharge from Gen. surgery standpoint I gave him 1 dose of MiraLAX We will leave drain in for now Follow-up with Dr. Barry Dienes in one week   _0 @  LOS: 4 days    Irais Mottram M 12/02/2014  . .prob

## 2014-12-02 NOTE — Progress Notes (Signed)
Pt d/c to home by car with family. Assessment stable. Prescriptions given. All questions answered. 

## 2014-12-05 ENCOUNTER — Telehealth: Payer: Self-pay | Admitting: Oncology

## 2014-12-05 NOTE — Telephone Encounter (Signed)
S/w pt's wife r/s missed visit 09/30 due to pt was in the hospital, confirmed labs/ov .Marland Kitchen... KJ

## 2014-12-07 ENCOUNTER — Telehealth: Payer: Self-pay | Admitting: *Deleted

## 2014-12-07 NOTE — Telephone Encounter (Signed)
Faxed paperwork related to disability to [employer]

## 2014-12-08 DIAGNOSIS — C641 Malignant neoplasm of right kidney, except renal pelvis: Secondary | ICD-10-CM | POA: Diagnosis not present

## 2014-12-08 DIAGNOSIS — R351 Nocturia: Secondary | ICD-10-CM | POA: Diagnosis not present

## 2014-12-11 ENCOUNTER — Encounter: Payer: Self-pay | Admitting: Medical

## 2014-12-11 ENCOUNTER — Other Ambulatory Visit: Payer: Self-pay | Admitting: Medical

## 2014-12-11 ENCOUNTER — Ambulatory Visit (HOSPITAL_BASED_OUTPATIENT_CLINIC_OR_DEPARTMENT_OTHER)
Admission: RE | Admit: 2014-12-11 | Discharge: 2014-12-11 | Disposition: A | Discharge status: Home / Self Care | Payer: Medicare Other | Source: Ambulatory Visit | Attending: Medical | Admitting: Medical

## 2014-12-11 ENCOUNTER — Ambulatory Visit (INDEPENDENT_AMBULATORY_CARE_PROVIDER_SITE_OTHER): Payer: Medicare Other | Admitting: Medical

## 2014-12-11 VITALS — BP 118/70 | HR 68 | Temp 97.9°F | Ht 66.75 in | Wt 188.0 lb

## 2014-12-11 DIAGNOSIS — Z8739 Personal history of other diseases of the musculoskeletal system and connective tissue: Secondary | ICD-10-CM

## 2014-12-11 DIAGNOSIS — I10 Essential (primary) hypertension: Secondary | ICD-10-CM | POA: Diagnosis not present

## 2014-12-11 DIAGNOSIS — IMO0002 Reserved for concepts with insufficient information to code with codable children: Secondary | ICD-10-CM

## 2014-12-11 DIAGNOSIS — Q600 Renal agenesis, unilateral: Secondary | ICD-10-CM

## 2014-12-11 DIAGNOSIS — M79671 Pain in right foot: Secondary | ICD-10-CM

## 2014-12-11 DIAGNOSIS — M79609 Pain in unspecified limb: Secondary | ICD-10-CM | POA: Diagnosis not present

## 2014-12-11 DIAGNOSIS — C799 Secondary malignant neoplasm of unspecified site: Secondary | ICD-10-CM

## 2014-12-11 DIAGNOSIS — C641 Malignant neoplasm of right kidney, except renal pelvis: Secondary | ICD-10-CM

## 2014-12-11 DIAGNOSIS — Z8639 Personal history of other endocrine, nutritional and metabolic disease: Secondary | ICD-10-CM | POA: Diagnosis not present

## 2014-12-11 DIAGNOSIS — M109 Gout, unspecified: Secondary | ICD-10-CM | POA: Diagnosis not present

## 2014-12-11 DIAGNOSIS — I5020 Unspecified systolic (congestive) heart failure: Secondary | ICD-10-CM

## 2014-12-11 DIAGNOSIS — Q602 Renal agenesis, unspecified: Secondary | ICD-10-CM

## 2014-12-11 DIAGNOSIS — M1000 Idiopathic gout, unspecified site: Secondary | ICD-10-CM

## 2014-12-11 DIAGNOSIS — E785 Hyperlipidemia, unspecified: Secondary | ICD-10-CM

## 2014-12-11 LAB — COMPREHENSIVE METABOLIC PANEL
ALK PHOS: 95 U/L (ref 40–115)
ALT: 21 U/L (ref 9–46)
AST: 14 U/L (ref 10–35)
Albumin: 3.6 g/dL (ref 3.6–5.1)
BILIRUBIN TOTAL: 0.3 mg/dL (ref 0.2–1.2)
BUN: 20 mg/dL (ref 7–25)
CALCIUM: 9.5 mg/dL (ref 8.6–10.3)
CO2: 28 mmol/L (ref 20–31)
Chloride: 103 mmol/L (ref 98–110)
Creat: 1.28 mg/dL — ABNORMAL HIGH (ref 0.70–1.25)
Glucose, Bld: 116 mg/dL — ABNORMAL HIGH (ref 65–99)
POTASSIUM: 4.5 mmol/L (ref 3.5–5.3)
Sodium: 138 mmol/L (ref 135–146)
TOTAL PROTEIN: 6.7 g/dL (ref 6.1–8.1)

## 2014-12-11 LAB — URIC ACID: Uric Acid, Serum: 6.8 mg/dL (ref 4.0–7.8)

## 2014-12-11 LAB — CBC WITH DIFFERENTIAL/PLATELET
BASOS PCT: 1 % (ref 0–1)
Basophils Absolute: 0.1 10*3/uL (ref 0.0–0.1)
EOS ABS: 0.6 10*3/uL (ref 0.0–0.7)
Eosinophils Relative: 6 % — ABNORMAL HIGH (ref 0–5)
HCT: 33.6 % — ABNORMAL LOW (ref 39.0–52.0)
HEMOGLOBIN: 10.8 g/dL — AB (ref 13.0–17.0)
Lymphocytes Relative: 15 % (ref 12–46)
Lymphs Abs: 1.6 10*3/uL (ref 0.7–4.0)
MCH: 26.8 pg (ref 26.0–34.0)
MCHC: 32.1 g/dL (ref 30.0–36.0)
MCV: 83.4 fL (ref 78.0–100.0)
MONO ABS: 0.8 10*3/uL (ref 0.1–1.0)
MONOS PCT: 8 % (ref 3–12)
MPV: 8.4 fL — ABNORMAL LOW (ref 8.6–12.4)
NEUTROS ABS: 7.4 10*3/uL (ref 1.7–7.7)
NEUTROS PCT: 70 % (ref 43–77)
PLATELETS: 754 10*3/uL — AB (ref 150–400)
RBC: 4.03 MIL/uL — AB (ref 4.22–5.81)
RDW: 14.7 % (ref 11.5–15.5)
WBC: 10.5 10*3/uL (ref 4.0–10.5)

## 2014-12-11 LAB — ESTIMATED GFR
GFR, EST AFRICAN AMERICAN: 67 mL/min (ref >=60)
GFR, EST NON AFRICAN AMERICAN: 58 mL/min — AB (ref >=60)

## 2014-12-11 NOTE — Progress Notes (Signed)
Pre visit review using our clinic review tool, if applicable. No additional management support is needed unless otherwise documented below in the visit note. 

## 2014-12-11 NOTE — Assessment & Plan Note (Signed)
pt had for 40 yrs. Pt is on corge, lasix and lisiopril.

## 2014-12-11 NOTE — Assessment & Plan Note (Signed)
Pt has history of kidney Cancer found July 2016.Marland Kitchen Rt side kidney removed on Sept 27, 2016.

## 2014-12-11 NOTE — Assessment & Plan Note (Signed)
CHF- pt states found mild chf found October 2015. Pt weight has been stable. No signs of chf recently

## 2014-12-11 NOTE — Assessment & Plan Note (Signed)
History of and unfortunately severe reaction to statin. Will get lipid panel future date and determine med that would be best but minimize potential reaction.Marland Kitchen

## 2014-12-11 NOTE — Patient Instructions (Addendum)
    HTN (hypertension) pt had for 40 yrs. Pt is on corge, lasix and lisiopril.  Renal cell carcinoma Pt has history of kidney Cancer found July 2016.Marland Kitchen Rt side kidney removed on Sept 27, 2016.  Metastatic renal cell carcinoma  Pt also had liver resection(tumor portion removed).  Pt had scans and no cancer in bones or lungs.Per pt report. Pt is going to see oncologist. Will have targeted therapy.   Congestive heart failure CHF- pt states found mild chf found October 2015. Pt weight has been stable. No signs of chf recently  Gout Hx of. Rt foot pain on and off chronic. Failed allopurinol in past. Told by surgeon no nsaid use at all. Pt states severe reaction to prednisone. He has solitary kidney. So I will refer pt to nephrologist. But will try to get opinion tomorrow from nephrologist as to treatment for gout with solitary kidney.  Hyperlipidemia History of and unfortunately severe reaction to statin. Will get lipid panel future date and determine med that would be best but minimize potential reaction..    For your foot pain. This may be gout. You have extreme hesitation on treatment with nsaid due to one kidney status(surgeon advisement against all nsaids) and you failed allopurinol in the past. Also you have extreme reaction to prednisone extremely high bp and anxiety.  I will try to touch base with your Dr Doroteo Bradford and will try to get nephrologist opinion.(Will prioritize nephrology first)  Medication do have risk and benefits. I want to help you avoid potential extrem pain with gout and protect kidney at same time.  Also if any symptoms changing like skin infection let us know.  Follow up this Friday or as needed.  [phone] surgeon

## 2014-12-11 NOTE — Assessment & Plan Note (Signed)
Pt also had liver resection(tumor portion removed).  Pt had scans and no cancer in bones or lungs.Per pt report. Pt is going to see oncologist. Will have targeted therapy.

## 2014-12-11 NOTE — Progress Notes (Signed)
Subjective:    Patient ID: Derek [name], male    DOB: 1950/03/29, 64 y.o.   MRN: [id number]  HPI   I have reviewed pt PMH, PSH, FH, Social History and Surgical History   Pt has hx of arthritis- told by cardiologist recently based on severe crepitus of the rt knee. He had surgery to his rt knee. Tore ligaments playing softball.   Pt has history of kidney Cancer found July 2016.Marland Kitchen Rt side kidney removed on Sept 27, 2016. Pt also had liver resection(tumor portion removed).  Pt had scans and no cancer in bones or lungs.  Pt is going to see oncologist. Will have targeted therapy.   Per pt surgeons thought they got good clearn margins. And no metastasis.  Htn- pt had for 40 yrs. Pt is on corge, lasix and lisiopril.  CHF- pt states found mild chf found October 2015. Pt weight has been stable. No signs of chf recently. Pt had quadruple bypass November 2015.  Gout- he has history of that in the past.  Pt rt foot hurts(very sensitive to touch). And is slight warm to tough.  Pt states he is in little bit dilema since he only has one kidney.(Assuming this is gout). Pt states in past he has always had gout in this foot.   Hyperlipidemia- Pt states around November 2015 he had severe weakness to legs. He said so weak he could not walk. Pt mother had severe reaction to statin as wel.      Review of Systems  Constitutional: Negative for fever, chills, diaphoresis, activity change and fatigue.  Respiratory: Negative for cough, chest tightness and shortness of breath.   Cardiovascular: Negative for chest pain, palpitations and leg swelling.  Gastrointestinal: Negative for nausea, vomiting and abdominal pain.  Musculoskeletal: Negative for neck pain and neck stiffness.       Rt foot pain.  Neurological: Negative for dizziness, tremors, seizures, syncope, facial asymmetry, speech difficulty, weakness, light-headedness, numbness and headaches.  Psychiatric/Behavioral: Negative for behavioral problems,  confusion and agitation. The patient is not nervous/anxious.    Past Medical History  Diagnosis Date  . Hypertension   . Gout   . NSTEMI (non-ST elevated myocardial infarction) ([place])   . Arthritis   . Shingles 7/15  . Renal cell carcinoma ([place]) 11/28/2014    Social History   Social History  . Marital Status: Married    Spouse Name: N/A  . Number of Children: N/A  . Years of Education: N/A   Occupational History  . Dentist    Social History Main Topics  . Smoking status: Never Smoker   . Smokeless tobacco: Never Used  . Alcohol Use: Yes     Comment: 1-2 drinks/week  . Drug Use: No  . Sexual Activity: Not on file   Other Topics Concern  . Not on file   Social History Narrative   Lives with wife    Past Surgical History  Procedure Laterality Date  . Knee surgery    . Coronary artery bypass graft N/A 01/27/2014    Procedure: CORONARY ARTERY BYPASS GRAFTING (CABG), ON PUMP, TIMES FOUR, USING LEFT INTERNAL MAMMARY ARTERY, RIGHT GREATER SAPHENOUS VEIN HARVESTED ENDOSCOPICALLY;  Surgeon: Gaye Pollack, MD;  Location: [place];  Service: Open Heart Surgery;  Laterality: N/A;  LIMA-LAD; SVG-OM; SVG-PD; SVG-DIAG  . Tee without cardioversion N/A 01/27/2014    Procedure: TRANSESOPHAGEAL ECHOCARDIOGRAM (TEE);  Surgeon: Gaye Pollack, MD;  Location: [place];  Service: Open Heart Surgery;  Laterality:  N/A;  . Left heart catheterization with coronary angiogram N/A 01/23/2014    Procedure: LEFT HEART CATHETERIZATION WITH CORONARY ANGIOGRAM;  Surgeon: Blane Ohara, MD;  Location[facility] CATH LAB;  Service: Cardiovascular;  Laterality: N/A;  . Open partial hepatectomy [83] N/A 11/28/2014    Procedure: OPEN PARTIAL HEPATECTOMY;  Surgeon: [name], MD;  Location: [place];  Service: General;  Laterality: N/A;  . Nephrectomy Right 11/28/2014    Procedure: RIGHT OPEN NEPHRECTOMY;  Surgeon: Cleon Gustin, MD;  Location: [place];  Service: Urology;  Laterality: Right;    Family History    Problem Relation Age of Onset  . Cancer Mother   . Cancer Father     Allergies  Allergen Reactions  . Lipitor [Atorvastatin] Other (See Comments)    Myopathy...severe, with  Myoglobinuria, wheelchair bound for a few days  . Penicillins Anaphylaxis  . Prednisone Anxiety and Other (See Comments)    Extremely high blood pressure after taking medication for first time.    Current Outpatient Prescriptions on File Prior to Visit  Medication Sig Dispense Refill  . aspirin EC 81 MG tablet Take 81 mg by mouth daily.    . carvedilol (COREG) 3.125 MG tablet Take 1 tablet (3.125 mg total) by mouth 2 (two) times daily with a meal. 60 tablet 6  . Cholecalciferol (VITAMIN D-3) 5000 UNITS TABS Take 5,000 Units by mouth daily.     Marland Kitchen docusate sodium (COLACE) 100 MG capsule Take 1 capsule (100 mg total) by mouth 2 (two) times daily. 60 capsule 0  . furosemide (LASIX) 20 MG tablet Take 20 mg by mouth daily as needed for fluid.   1  . lisinopril (PRINIVIL,ZESTRIL) 10 MG tablet Take 1 tablet (10 mg total) by mouth 2 (two) times daily. 180 tablet 1  . Multiple Vitamin (MULTIVITAMIN) capsule Take 1 capsule by mouth daily.    Marland Kitchen oxyCODONE (OXY IR/ROXICODONE) 5 MG immediate release tablet Take 1-2 tablets (5-10 mg total) by mouth every 4 (four) hours as needed for moderate pain, severe pain or breakthrough pain. 30 tablet 0  . potassium chloride SA (K-DUR,KLOR-CON) 20 MEQ tablet Take 1 tablet (20 mEq total) by mouth daily. (Patient taking differently: Take 10 mEq by mouth 2 (two) times daily. ) 30 tablet 9  . Turmeric 500 MG CAPS Take 500 mg by mouth daily.      No current facility-administered medications on file prior to visit.    BP 118/70 mmHg  Pulse 68  Temp(Src) 97.9 F (36.6 C) (Oral)  Ht 5' 6.75" (1.695 m)  Wt 188 lb (85.276 kg)  BMI 29.68 kg/m2  SpO2 98%        Objective:   Physical Exam  General Mental Status- Alert. General Appearance- Not in acute distress.   Skin General: Color-  Normal Color. Moisture- Normal Moisture.  Neck Carotid Arteries- Normal color. Moisture- Normal Moisture.Marland Kitchen No JVD.  Chest and Lung Exam Auscultation: Breath Sounds:-Normal.  Cardiovascular Auscultation:Rythm- Regular. Murmurs & Other Heart Sounds:Auscultation of the heart reveals- No Murmurs.  Abdomen Inspection:-Inspeection Normal. Palpation/Percussion:Note:No mass. Palpation and Percussion of the abdomen reveal- Non Tender, Non Distended + BS, no rebound or guarding.    Neurologic Cranial Nerve exam:- CN III-XII intact(No nystagmus), symmetric smile.  Strength:- 5/5 equal and symmetric strength both upper and lower extremities.  Rt ower ext- no pedal edema. Negative homans sign.  Rt foot- faint swollen and pinkish red. Swollen top aspect of foot. Most tender over mid  4th and 5th  metarsal. No redness or swelling of calf.     Assessment & Plan:

## 2014-12-11 NOTE — Assessment & Plan Note (Signed)
Hx of. Rt foot pain on and off chronic. Failed allopurinol in past. Told by surgeon no nsaid use at all. Pt states severe reaction to prednisone. He has solitary kidney. So I will refer pt to nephrologist. But will try to get opinion tomorrow from nephrologist as to treatment for gout with solitary kidney.

## 2014-12-12 ENCOUNTER — Telehealth: Payer: Self-pay | Admitting: Medical

## 2014-12-12 MED ORDER — COLCHICINE 0.6 MG PO TABS: ORAL_TABLET | ORAL | Status: DC

## 2014-12-12 NOTE — Telephone Encounter (Signed)
I did call in colchicine to pt pharmacy. [facility]. [phone].  Rx colchicine #30 tabs. 1 tab po bid for 2 days, then 1 tab a day for 7 days. #30. Remainder to be used for flares in future.  I talked with wife and explained talked with [place] [facility] and Dr. Justin Mend thought above most reasonable approach in light of his history. Will plan to either rx uloric or refer to rhuematologist near future. Pt has appointment to follow up on Friday or at least update [place] on how he is.  She did mention Derek [name] was doing some better today. He was driving so I could not talk to him directly.  Will print out the rx here for documentation purposes.

## 2014-12-15 ENCOUNTER — Ambulatory Visit: Payer: Medicare Other | Admitting: Medical

## 2014-12-22 ENCOUNTER — Ambulatory Visit (INDEPENDENT_AMBULATORY_CARE_PROVIDER_SITE_OTHER): Payer: Medicare Other | Admitting: Medical

## 2014-12-22 ENCOUNTER — Encounter: Payer: Self-pay | Admitting: Medical

## 2014-12-22 VITALS — BP 153/83 | HR 87 | Temp 98.0°F | Wt 186.0 lb

## 2014-12-22 DIAGNOSIS — M1000 Idiopathic gout, unspecified site: Secondary | ICD-10-CM

## 2014-12-22 MED ORDER — FEBUXOSTAT 40 MG PO TABS: 40.0000 mg | ORAL_TABLET | Freq: Every day | ORAL | Status: DC

## 2014-12-22 NOTE — Progress Notes (Signed)
Pre visit review using our clinic review tool, if applicable. No additional management support is needed unless otherwise documented below in the visit note. 

## 2014-12-22 NOTE — Progress Notes (Signed)
Subjective:    Patient ID: Derek [name], male    DOB: 12/06/1950, 64 y.o.   MRN: [id number]  HPI   Pt in for follow up. Pt has history of gout. He did respond to the colchicine.   I was considering uloric due to his status of solitary kidney.  Pt had great relief while on colchine. However when he stopped med he  only had a one day pain free. Then pain returned this am. He took 2 colchicine and now feels good again.    Review of Systems  Constitutional: Negative for fever, chills and fatigue.  Respiratory: Negative for cough, chest tightness, shortness of breath and wheezing.   Cardiovascular: Negative for chest pain and palpitations.  Musculoskeletal: Negative for back pain.       Rt foot pain.  Hematological: Negative for adenopathy. Does not bruise/bleed easily.  Psychiatric/Behavioral: Negative for behavioral problems and confusion.     Past Medical History  Diagnosis Date  . Hypertension   . Gout   . NSTEMI (non-ST elevated myocardial infarction) ([place])   . Arthritis   . Shingles 7/15  . Renal cell carcinoma ([facility]) 11/28/2014    Social History   Social History  . Marital Status: Married    Spouse Name: N/A  . Number of Children: N/A  . Years of Education: N/A   Occupational History  . Dentist    Social History Main Topics  . Smoking status: Never Smoker   . Smokeless tobacco: Never Used  . Alcohol Use: Yes     Comment: 1-2 drinks/week  . Drug Use: No  . Sexual Activity: Not on file   Other Topics Concern  . Not on file   Social History Narrative   Lives with wife    Past Surgical History  Procedure Laterality Date  . Knee surgery    . Coronary artery bypass graft N/A 01/27/2014    Procedure: CORONARY ARTERY BYPASS GRAFTING (CABG), ON PUMP, TIMES FOUR, USING LEFT INTERNAL MAMMARY ARTERY, RIGHT GREATER SAPHENOUS VEIN HARVESTED ENDOSCOPICALLY;  Surgeon: Gaye Pollack, MD;  Location: [facility];  Service: Open Heart Surgery;  Laterality: N/A;  LIMA-LAD;  SVG-OM; SVG-PD; SVG-DIAG  . Tee without cardioversion N/A 01/27/2014    Procedure: TRANSESOPHAGEAL ECHOCARDIOGRAM (TEE);  Surgeon: Gaye Pollack, MD;  Location: [facility];  Service: Open Heart Surgery;  Laterality: N/A;  . Left heart catheterization with coronary angiogram N/A 01/23/2014    Procedure: LEFT HEART CATHETERIZATION WITH CORONARY ANGIOGRAM;  Surgeon: Blane Ohara, MD;  Location: [facility] CATH LAB;  Service: Cardiovascular;  Laterality: N/A;  . Open partial hepatectomy [83] N/A 11/28/2014    Procedure: OPEN PARTIAL HEPATECTOMY;  Surgeon: [name], MD;  Location: [facility];  Service: General;  Laterality: N/A;  . Nephrectomy Right 11/28/2014    Procedure: RIGHT OPEN NEPHRECTOMY;  Surgeon: Cleon Gustin, MD;  Location: [facility];  Service: Urology;  Laterality: Right;    Family History  Problem Relation Age of Onset  . Cancer Mother   . Cancer Father     Allergies  Allergen Reactions  . Lipitor [Atorvastatin] Other (See Comments)    Myopathy...severe, with  Myoglobinuria, wheelchair bound for a few days  . Penicillins Anaphylaxis  . Prednisone Anxiety and Other (See Comments)    Extremely high blood pressure after taking medication for first time.    Current Outpatient Prescriptions on File Prior to Visit  Medication Sig Dispense Refill  . aspirin EC 81 MG tablet Take 81 mg  by mouth daily.    . carvedilol (COREG) 3.125 MG tablet Take 1 tablet (3.125 mg total) by mouth 2 (two) times daily with a meal. 60 tablet 6  . Cholecalciferol (VITAMIN D-3) 5000 UNITS TABS Take 5,000 Units by mouth daily.     . colchicine 0.6 MG tablet 1 tab po bid x 2 days then 1 tab a day for 7 days 30 tablet 0  . docusate sodium (COLACE) 100 MG capsule Take 1 capsule (100 mg total) by mouth 2 (two) times daily. 60 capsule 0  . furosemide (LASIX) 20 MG tablet Take 20 mg by mouth daily as needed for fluid.   1  . lisinopril (PRINIVIL,ZESTRIL) 10 MG tablet Take 1 tablet (10 mg total) by mouth 2 (two) times  daily. 180 tablet 1  . Multiple Vitamin (MULTIVITAMIN) capsule Take 1 capsule by mouth daily.    Marland Kitchen oxyCODONE (OXY IR/ROXICODONE) 5 MG immediate release tablet Take 1-2 tablets (5-10 mg total) by mouth every 4 (four) hours as needed for moderate pain, severe pain or breakthrough pain. 30 tablet 0  . potassium chloride SA (K-DUR,KLOR-CON) 20 MEQ tablet Take 1 tablet (20 mEq total) by mouth daily. (Patient taking differently: Take 10 mEq by mouth 2 (two) times daily. ) 30 tablet 9  . Turmeric 500 MG CAPS Take 500 mg by mouth daily.      No current facility-administered medications on file prior to visit.    BP 153/83 mmHg  Pulse 87  Temp(Src) 98 F (36.7 C)  Wt 186 lb (84.369 kg)  SpO2 100%       Objective:   Physical Exam  General- No acute distress. Pleasant patient. Neck- Full range of motion, no jvd Lungs- Clear, even and unlabored. Heart- regular rate and rhythm. Neurologic- CNII- XII grossly intact.  Rt foot- very faint pain on palpation distal aspect. Foot not swollen. No red or warm.       Assessment & Plan:  Go ahead and continue your colchicine for another 6 days.  I am going to rx the uloric. Hopefully will be quick process.   I do want to repeat cmp function in 2 wks. Will put in future order for that. When you are in for that lab you can update [place] on your pain level. Contact us sooner if needed.  Note I did talk with our pharmacist today regarding use of gout meds with his hx of solitary kidney.

## 2014-12-22 NOTE — Patient Instructions (Addendum)
Go ahead and continue your colchicine for another 6 days.  I am going to rx the uloric. Hopefully will be quick process.   I do want to repeat cmp function in 2 wks. Will put in future order for that. When you are in for that lab you can update [place] on your pain level. Contact us sooner if needed.

## 2015-01-02 ENCOUNTER — Telehealth: Payer: Self-pay | Admitting: Medical

## 2015-01-02 NOTE — Telephone Encounter (Signed)
How is pt doing? Is his foot pain controlled. Did he get uloric. Let me know. I was considering getting rheumatology referral depending on how he is?

## 2015-01-03 NOTE — Telephone Encounter (Signed)
Left message for pt to call back  °

## 2015-01-05 ENCOUNTER — Other Ambulatory Visit (INDEPENDENT_AMBULATORY_CARE_PROVIDER_SITE_OTHER): Payer: Medicare Other

## 2015-01-05 DIAGNOSIS — M1000 Idiopathic gout, unspecified site: Secondary | ICD-10-CM | POA: Diagnosis not present

## 2015-01-05 LAB — COMPREHENSIVE METABOLIC PANEL
ALK PHOS: 89 U/L (ref 39–117)
ALT: 26 U/L (ref 0–53)
AST: 19 U/L (ref 0–37)
Albumin: 3.8 g/dL (ref 3.5–5.2)
BILIRUBIN TOTAL: 0.5 mg/dL (ref 0.2–1.2)
BUN: 20 mg/dL (ref 6–23)
CO2: 30 mEq/L (ref 19–32)
Calcium: 9.7 mg/dL (ref 8.4–10.5)
Chloride: 105 mEq/L (ref 96–112)
Creatinine, Ser: 1.41 mg/dL (ref 0.40–1.50)
GFR: 53.38 mL/min — AB (ref >60.00)
GLUCOSE: 109 mg/dL — AB (ref 70–99)
Potassium: 4.3 mEq/L (ref 3.5–5.1)
Sodium: 140 mEq/L (ref 135–145)
TOTAL PROTEIN: 7.3 g/dL (ref 6.0–8.3)

## 2015-01-05 LAB — URIC ACID: Uric Acid, Serum: 4.9 mg/dL (ref 4.0–7.8)

## 2015-01-09 ENCOUNTER — Ambulatory Visit: Payer: Medicare Other | Admitting: Oncology

## 2015-01-09 ENCOUNTER — Other Ambulatory Visit: Payer: Medicare Other

## 2015-01-15 ENCOUNTER — Other Ambulatory Visit: Payer: Self-pay | Admitting: Medical

## 2015-01-26 ENCOUNTER — Other Ambulatory Visit (HOSPITAL_BASED_OUTPATIENT_CLINIC_OR_DEPARTMENT_OTHER): Payer: Medicare Other

## 2015-01-26 ENCOUNTER — Telehealth: Payer: Self-pay | Admitting: Oncology

## 2015-01-26 ENCOUNTER — Ambulatory Visit (HOSPITAL_BASED_OUTPATIENT_CLINIC_OR_DEPARTMENT_OTHER): Payer: Medicare Other | Admitting: Oncology

## 2015-01-26 VITALS — BP 155/86 | HR 76 | Temp 98.4°F | Resp 18 | Ht 66.75 in | Wt 197.1 lb

## 2015-01-26 DIAGNOSIS — C779 Secondary and unspecified malignant neoplasm of lymph node, unspecified: Secondary | ICD-10-CM | POA: Diagnosis not present

## 2015-01-26 DIAGNOSIS — C787 Secondary malignant neoplasm of liver and intrahepatic bile duct: Secondary | ICD-10-CM | POA: Diagnosis not present

## 2015-01-26 DIAGNOSIS — R918 Other nonspecific abnormal finding of lung field: Secondary | ICD-10-CM

## 2015-01-26 DIAGNOSIS — C649 Malignant neoplasm of unspecified kidney, except renal pelvis: Secondary | ICD-10-CM

## 2015-01-26 DIAGNOSIS — R160 Hepatomegaly, not elsewhere classified: Secondary | ICD-10-CM

## 2015-01-26 DIAGNOSIS — C641 Malignant neoplasm of right kidney, except renal pelvis: Secondary | ICD-10-CM | POA: Diagnosis not present

## 2015-01-26 LAB — COMPREHENSIVE METABOLIC PANEL (CC13)
ALT: 21 U/L (ref 0–55)
ANION GAP: 9 meq/L (ref 3–11)
AST: 22 U/L (ref 5–34)
Albumin: 3.7 g/dL (ref 3.5–5.0)
Alkaline Phosphatase: 71 U/L (ref 40–150)
BUN: 27.6 mg/dL — ABNORMAL HIGH (ref 7.0–26.0)
CALCIUM: 9.7 mg/dL (ref 8.4–10.4)
CHLORIDE: 107 meq/L (ref 98–109)
CO2: 24 mEq/L (ref 22–29)
CREATININE: 1.6 mg/dL — AB (ref 0.7–1.3)
EGFR: 45 mL/min/{1.73_m2} — ABNORMAL LOW (ref >90)
Glucose: 92 mg/dl (ref 70–140)
POTASSIUM: 4.5 meq/L (ref 3.5–5.1)
Sodium: 140 mEq/L (ref 136–145)
Total Bilirubin: 0.48 mg/dL (ref 0.20–1.20)
Total Protein: 7.3 g/dL (ref 6.4–8.3)

## 2015-01-26 LAB — CBC WITH DIFFERENTIAL/PLATELET
BASO%: 0.8 % (ref 0.0–2.0)
BASOS ABS: 0.1 10*3/uL (ref 0.0–0.1)
EOS%: 4.2 % (ref 0.0–7.0)
Eosinophils Absolute: 0.3 10*3/uL (ref 0.0–0.5)
HEMATOCRIT: 38.8 % (ref 38.4–49.9)
HGB: 12.5 g/dL — ABNORMAL LOW (ref 13.0–17.1)
LYMPH%: 20.1 % (ref 14.0–49.0)
MCH: 27.9 pg (ref 27.2–33.4)
MCHC: 32.3 g/dL (ref 32.0–36.0)
MCV: 86.4 fL (ref 79.3–98.0)
MONO#: 0.7 10*3/uL (ref 0.1–0.9)
MONO%: 11.2 % (ref 0.0–14.0)
NEUT#: 4.3 10*3/uL (ref 1.5–6.5)
NEUT%: 63.7 % (ref 39.0–75.0)
PLATELETS: 277 10*3/uL (ref 140–400)
RBC: 4.49 10*6/uL (ref 4.20–5.82)
RDW: 17.7 % — AB (ref 11.0–14.6)
WBC: 6.7 10*3/uL (ref 4.0–10.3)
lymph#: 1.3 10*3/uL (ref 0.9–3.3)

## 2015-01-26 MED ORDER — PAZOPANIB HCL 200 MG PO TABS: 800.0000 mg | ORAL_TABLET | Freq: Every day | ORAL | Status: DC

## 2015-01-26 NOTE — Progress Notes (Signed)
Hematology and Oncology Follow Up Visit  Derek [name] [id number] 03-08-1950 64 y.o. 01/26/2015 12:13 PM Saguier, Percell Miller, PA-CNo ref. provider found   Principle Diagnosis: 64 year old with renal cell carcinoma. He was diagnosed in August 2016 measuring 15.3 x 10.9 x 3.0 cm in the right kidney. He has retroperitoneal lymphadenopathy and a 2.7 cm liver mass indicating stage IV disease.    Prior Therapy: He is S/P right open radical nephrectomy and retroperitoneal lymph node dissection done on 11/28/2014. He also had partial right hepatectomy.  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 Metastatic lymph node involvement noted in the sampled lymph nodes. He also had involvement of renal cell carcinoma in the liver resection sample.  Current therapy: Under consideration for systemic therapy.   Interim History:  Derek [name] presents today for a follow-up visit. Since the last visit, he underwent nephrectomy and partial hepatectomy and have fully recovered at this time. He does report some occasional soreness around the incision site. No severe pain or any other complaints at this time. He has gained all activities of daily living at this time. He still report some issues with constipation and he is on stool softeners at this time. He is back to work without any decline. Has not reported any cough or hemoptysis. Has not reported any other constitutional symptoms.  He does not report any headaches, blurry vision, syncope or seizures. He does not report any fevers, chills, sweats weight loss or appetite changes. He does not report any chest pain, palpitation, orthopnea, leg edema. He does not report any cough, wheezing or dyspnea on exertion. He does not report any nausea, vomiting, abdominal pain, flank pain, or hematochezia. He does not report any frequency, urgency or hesitancy. He does not report any nocturia or dysuria. He does not report any skeletal  complaints of arthralgias or myalgias. Does not report any lymphadenopathy or petechiae. Remaining review of systems unremarkable  Medications: I have reviewed the patient's current medications.  Current Outpatient Prescriptions  Medication Sig Dispense Refill  . aspirin EC 81 MG tablet Take 81 mg by mouth daily.    . carvedilol (COREG) 3.125 MG tablet Take 1 tablet (3.125 mg total) by mouth 2 (two) times daily with a meal. 60 tablet 6  . Cholecalciferol (VITAMIN D-3) 5000 UNITS TABS Take 5,000 Units by mouth daily.     . colchicine 0.6 MG tablet 1 tab po bid x 2 days then 1 tab a day for 7 days 30 tablet 0  . docusate sodium (COLACE) 100 MG capsule Take 1 capsule (100 mg total) by mouth 2 (two) times daily. 60 capsule 0  . lisinopril (PRINIVIL,ZESTRIL) 10 MG tablet Take 1 tablet (10 mg total) by mouth 2 (two) times daily. 180 tablet 1  . Multiple Vitamin (MULTIVITAMIN) capsule Take 1 capsule by mouth daily.    . potassium chloride SA (K-DUR,KLOR-CON) 20 MEQ tablet Take 1 tablet (20 mEq total) by mouth daily. (Patient taking differently: Take 10 mEq by mouth 2 (two) times daily. ) 30 tablet 9  . Turmeric 500 MG CAPS Take 500 mg by mouth daily.     Marland Kitchen ULORIC 40 MG tablet TAKE ONE TABLET BY MOUTH ONCE DAILY 30 tablet 0  . pazopanib (VOTRIENT) 200 MG tablet Take 4 tablets (800 mg total) by mouth daily. Take on an empty stomach. 120 tablet 0   No current facility-administered medications for this visit.     Allergies:  Allergies  Allergen Reactions  . Lipitor [Atorvastatin] Other (See Comments)    Myopathy...severe, with  Myoglobinuria, wheelchair bound for a few days  . Penicillins Anaphylaxis  . Prednisone Anxiety and Other (See Comments)    Extremely high blood pressure after taking medication for first time.    Past Medical History, Surgical history, Social history, and Family History were reviewed and updated.   Blood pressure 155/86, pulse 76, temperature 98.4 F (36.9 C),  temperature source Oral, resp. rate 18, height 5' 6.75" (1.695 m), weight 197 lb 1.6 oz (89.404 kg), SpO2 100 %. ECOG: 0 General appearance: alert and cooperative. Not in any distress.  Head: Normocephalic, without obvious abnormality Neck: no adenopathy Lymph nodes: Cervical, supraclavicular, and axillary nodes normal. Heart:regular rate and rhythm, S1, S2 normal, no murmur, click, rub or gallop Lung:chest clear, no wheezing, rales, normal symmetric air entry.  Abdomin: soft, non-tender, without masses or organomegaly. Scar well healed.  EXT:no erythema, induration, or nodules   Lab Results: Lab Results  Component Value Date   WBC 6.7 01/26/2015   HGB 12.5* 01/26/2015   HCT 38.8 01/26/2015   MCV 86.4 01/26/2015   PLT 277 01/26/2015     Chemistry      Component Value Date/Time   NA 140 01/05/2015 0916   K 4.3 01/05/2015 0916   CL 105 01/05/2015 0916   CO2 30 01/05/2015 0916   BUN 20 01/05/2015 0916   CREATININE 1.41 01/05/2015 0916   CREATININE 1.28* 12/11/2014 1614      Component Value Date/Time   CALCIUM 9.7 01/05/2015 0916   ALKPHOS 89 01/05/2015 0916   AST 19 01/05/2015 0916   ALT 26 01/05/2015 0916   BILITOT 0.5 01/05/2015 0916        Impression and Plan:  64 year old gentleman with the following issues:  1. 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 He is status post surgical resection in September 2016 and he is fully healed at this time.  The plan at this point is to restage him with a CT scan chest abdomen and pelvis to determine the extent of his disease. In all likelihood he will require systemic therapy regardless of these findings. Even if he has stage IV NED disease, I think he would benefit from systemic therapy.  The rationale of using Votrient was reviewed today in detail. Complications associated with this medication include nausea, vomiting, abdominal pain,  diarrhea, hypertension, elevation in his liver function test, and others. He is agreeable to proceed after his CT scan which will be scheduled next week. I will evaluate him in 2 weeks to assess any complications associated with this medication as well as discussed the results of the CT scan.  2. Renal insufficiency: His creatinine is borderline after nephrectomy and needs to be monitored moving forward.  3. Transaminase monitoring: This disease to be monitored on Votrient.  4. Follow-up: Will be in 2 weeks after results of the CT scan and potentially start of Votrient.    [name], MD 11/25/201612:13 PM

## 2015-01-26 NOTE — Telephone Encounter (Signed)
Gave patient avs report and appointments for December - [facility] will call re [facility] - patient aware.

## 2015-01-29 ENCOUNTER — Telehealth: Payer: Self-pay | Admitting: Pharmacist

## 2015-01-29 NOTE — Telephone Encounter (Signed)
New Rx for Votrient sent to [facility] outpatient pharmacy on Friday 11/25. Rx requires prior auth. Prior Auth faxed to [facility] on Monday 11/28.

## 2015-01-30 ENCOUNTER — Other Ambulatory Visit: Payer: Self-pay | Admitting: *Deleted

## 2015-02-02 ENCOUNTER — Other Ambulatory Visit (HOSPITAL_BASED_OUTPATIENT_CLINIC_OR_DEPARTMENT_OTHER): Payer: Medicare Other

## 2015-02-02 ENCOUNTER — Ambulatory Visit (HOSPITAL_COMMUNITY)
Admission: RE | Admit: 2015-02-02 | Discharge: 2015-02-02 | Disposition: A | Discharge status: Home / Self Care | Payer: Medicare Other | Source: Ambulatory Visit | Attending: Oncology | Admitting: Oncology

## 2015-02-02 DIAGNOSIS — R160 Hepatomegaly, not elsewhere classified: Secondary | ICD-10-CM

## 2015-02-02 DIAGNOSIS — R918 Other nonspecific abnormal finding of lung field: Secondary | ICD-10-CM | POA: Insufficient documentation

## 2015-02-02 DIAGNOSIS — M799 Soft tissue disorder, unspecified: Secondary | ICD-10-CM | POA: Insufficient documentation

## 2015-02-02 DIAGNOSIS — I2510 Atherosclerotic heart disease of native coronary artery without angina pectoris: Secondary | ICD-10-CM | POA: Insufficient documentation

## 2015-02-02 DIAGNOSIS — C649 Malignant neoplasm of unspecified kidney, except renal pelvis: Secondary | ICD-10-CM | POA: Diagnosis not present

## 2015-02-02 DIAGNOSIS — K8020 Calculus of gallbladder without cholecystitis without obstruction: Secondary | ICD-10-CM | POA: Diagnosis not present

## 2015-02-02 DIAGNOSIS — K769 Liver disease, unspecified: Secondary | ICD-10-CM | POA: Diagnosis not present

## 2015-02-02 DIAGNOSIS — K7689 Other specified diseases of liver: Secondary | ICD-10-CM | POA: Insufficient documentation

## 2015-02-02 DIAGNOSIS — I700 Atherosclerosis of aorta: Secondary | ICD-10-CM | POA: Diagnosis not present

## 2015-02-02 DIAGNOSIS — D1803 Hemangioma of intra-abdominal structures: Secondary | ICD-10-CM | POA: Insufficient documentation

## 2015-02-02 LAB — COMPREHENSIVE METABOLIC PANEL
ALBUMIN: 3.8 g/dL (ref 3.5–5.0)
ALK PHOS: 67 U/L (ref 40–150)
ALT: 25 U/L (ref 0–55)
ANION GAP: 9 meq/L (ref 3–11)
AST: 26 U/L (ref 5–34)
BUN: 30 mg/dL — ABNORMAL HIGH (ref 7.0–26.0)
CALCIUM: 9.8 mg/dL (ref 8.4–10.4)
CHLORIDE: 109 meq/L (ref 98–109)
CO2: 23 mEq/L (ref 22–29)
Creatinine: 1.5 mg/dL — ABNORMAL HIGH (ref 0.7–1.3)
EGFR: 47 mL/min/{1.73_m2} — AB (ref >90)
Glucose: 90 mg/dl (ref 70–140)
POTASSIUM: 4.6 meq/L (ref 3.5–5.1)
Sodium: 140 mEq/L (ref 136–145)
Total Bilirubin: 0.46 mg/dL (ref 0.20–1.20)
Total Protein: 7.7 g/dL (ref 6.4–8.3)

## 2015-02-02 LAB — CBC WITH DIFFERENTIAL/PLATELET
BASO%: 1.1 % (ref 0.0–2.0)
BASOS ABS: 0.1 10*3/uL (ref 0.0–0.1)
EOS ABS: 0.2 10*3/uL (ref 0.0–0.5)
EOS%: 3.7 % (ref 0.0–7.0)
HEMATOCRIT: 40.1 % (ref 38.4–49.9)
HEMOGLOBIN: 13.1 g/dL (ref 13.0–17.1)
LYMPH#: 1.1 10*3/uL (ref 0.9–3.3)
LYMPH%: 20.7 % (ref 14.0–49.0)
MCH: 28.1 pg (ref 27.2–33.4)
MCHC: 32.6 g/dL (ref 32.0–36.0)
MCV: 86.4 fL (ref 79.3–98.0)
MONO#: 0.5 10*3/uL (ref 0.1–0.9)
MONO%: 9.9 % (ref 0.0–14.0)
NEUT#: 3.5 10*3/uL (ref 1.5–6.5)
NEUT%: 64.6 % (ref 39.0–75.0)
Platelets: 273 10*3/uL (ref 140–400)
RBC: 4.64 10*6/uL (ref 4.20–5.82)
RDW: 17.1 % — AB (ref 11.0–14.6)
WBC: 5.4 10*3/uL (ref 4.0–10.3)

## 2015-02-02 NOTE — Telephone Encounter (Signed)
12/2 - Prior Auth approved for Derek [name] from [facility]. Votrient copay is high at > $2700. He will come by [facility] next week to pick up Votrient samples and sign forms for assitance/free trial of votrient. All outside funds are closed at the moment but may open up in the 2017

## 2015-02-09 ENCOUNTER — Telehealth: Payer: Self-pay | Admitting: Oncology

## 2015-02-09 ENCOUNTER — Ambulatory Visit: Payer: Medicare Other | Admitting: Oncology

## 2015-02-09 NOTE — Telephone Encounter (Signed)
pt cld to r/s appt-gave r/s time & dtae

## 2015-02-13 ENCOUNTER — Telehealth: Payer: Self-pay | Admitting: Internal Medicine

## 2015-02-13 NOTE — Telephone Encounter (Signed)
Returned call to patient's wife.She stated husband had kidney removed in 11/2014 due to cancer.Stated his B/P has been alittle elevated and she wanted to ask Dr.Hilty if ok to take lasix with one kidney.Stated he use to take lasix 20 mg 1/2 tablet daily as needed.Message sent to Dr.Hilty for advice.

## 2015-02-13 NOTE — Telephone Encounter (Signed)
Please call,pt wants to know how taking Lasix will effect him. Pt has only one kidney,had cancer.

## 2015-02-14 NOTE — Telephone Encounter (Signed)
Returned call to patient's wife wanting to know if ok for husband to take lasix 20 mg 1/2 tablet as needed with only one kidney.Advised Dr.Hilty out of office this week.I will call back as soon as Dr.Hilty responds to message

## 2015-02-14 NOTE — Telephone Encounter (Signed)
Returning call from yesterday. °

## 2015-02-15 NOTE — Telephone Encounter (Signed)
No need to reduced the dose in half since there is now only one kidney. He is only taking it as needed.  DR. Lemmie Evens

## 2015-02-16 NOTE — Telephone Encounter (Signed)
Spoke to wife. Information given. Verbalized understanding.

## 2015-02-19 ENCOUNTER — Other Ambulatory Visit: Payer: Self-pay | Admitting: Family

## 2015-02-20 NOTE — Telephone Encounter (Signed)
Spoke with pt's wife and she voices understanding. Per wife pt was told he would have to wait three months to get an appointment and ES was advised to come in for CPE and do fasting lab work. Pt's wife voices understanding.

## 2015-02-20 NOTE — Telephone Encounter (Signed)
Derek [name] please advise on refill.  

## 2015-02-20 NOTE — Telephone Encounter (Signed)
I did refill his uloric. He is asking for refill(I am giving him refills. He has not called so confirm that his gout is improved. I had referred him to nephrology in October since he has solitary kidney. Did he every go? Does he have outstanding appointment? Maybe Anderson Malta or Pleas Koch knows. Let me know how he is and know regarding nephrology appointment.  Also he is relatively new and I discussed gout on his appointments. But never addressed his health maintenance issues which he has fair number to address. Would you ask him to scheudule CPE in am. Would like to do fasting labs. Cbc, cmp, tsh, lipid, psa. And make up to date on things such as colonoscopy. Let me know if he up for that.

## 2015-02-23 ENCOUNTER — Telehealth: Payer: Self-pay | Admitting: Oncology

## 2015-02-23 ENCOUNTER — Ambulatory Visit (HOSPITAL_BASED_OUTPATIENT_CLINIC_OR_DEPARTMENT_OTHER): Payer: Medicare Other | Admitting: Oncology

## 2015-02-23 VITALS — BP 180/88 | HR 75 | Temp 98.2°F | Resp 18 | Wt 203.3 lb

## 2015-02-23 DIAGNOSIS — R918 Other nonspecific abnormal finding of lung field: Secondary | ICD-10-CM

## 2015-02-23 DIAGNOSIS — C641 Malignant neoplasm of right kidney, except renal pelvis: Secondary | ICD-10-CM | POA: Diagnosis not present

## 2015-02-23 DIAGNOSIS — R160 Hepatomegaly, not elsewhere classified: Secondary | ICD-10-CM | POA: Diagnosis not present

## 2015-02-23 DIAGNOSIS — N289 Disorder of kidney and ureter, unspecified: Secondary | ICD-10-CM

## 2015-02-23 DIAGNOSIS — C649 Malignant neoplasm of unspecified kidney, except renal pelvis: Secondary | ICD-10-CM

## 2015-02-23 NOTE — Telephone Encounter (Signed)
per pof to sch pt appt-gave pt copy of avs-adv pt tha t [facility] will[facility] all to sch trmt

## 2015-02-23 NOTE — Telephone Encounter (Signed)
per pof to sch pt appt-gave pt copy of avs °

## 2015-02-23 NOTE — Progress Notes (Signed)
Hematology and Oncology Follow Up Visit  Derek [name] AQ:[id number] 06-17-1950 64 y.o. 02/23/2015 8:39 AM   Principle Diagnosis: 65 year old with renal cell carcinoma. He was diagnosed in August 2016 measuring 15.3 x 10.9 x 3.0 cm in the right kidney. He has retroperitoneal lymphadenopathy and a 2.7 cm liver mass indicating stage IV disease.    Prior Therapy: He is S/P right open radical nephrectomy and retroperitoneal lymph node dissection done on 11/28/2014. He also had partial right hepatectomy.  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 Metastatic lymph node involvement noted in the sampled lymph nodes. He also had involvement of renal cell carcinoma in the liver resection sample.  Current therapy: Under consideration for systemic therapy vs. observation and surveillance.  Interim History:  Derek [name] presents today for a follow-up visit. Since the last visit, he reports no recent complaints. He is fully recovered from his surgery and resumed all activities of daily living. He does report some occasional soreness around the incision site. No severe pain or any other complaints at this time. Has not reported any cough or hemoptysis. Has not reported any other constitutional symptoms. He does not report any hematuria or dysuria. He has reported some increase in his blood pressure and Lasix was added to his medication recently. He has been urinating more frequently because of that.  He does not report any headaches, blurry vision, syncope or seizures. He does not report any fevers, chills, sweats weight loss or appetite changes. He does not report any chest pain, palpitation, orthopnea. He does not report any cough, wheezing or dyspnea on exertion. He does not report any nausea, vomiting, abdominal pain, flank pain, or hematochezia. He does not report any frequency, urgency or hesitancy. He does not report any skeletal complaints of arthralgias or  myalgias. Does not report any lymphadenopathy or petechiae. Remaining review of systems unremarkable  Medications: I have reviewed the patient's current medications.  Current Outpatient Prescriptions  Medication Sig Dispense Refill  . aspirin EC 81 MG tablet Take 81 mg by mouth daily.    . carvedilol (COREG) 3.125 MG tablet Take 1 tablet (3.125 mg total) by mouth 2 (two) times daily with a meal. 60 tablet 6  . Cholecalciferol (VITAMIN D-3) 5000 UNITS TABS Take 5,000 Units by mouth daily.     . colchicine 0.6 MG tablet 1 tab po bid x 2 days then 1 tab a day for 7 days 30 tablet 0  . docusate sodium (COLACE) 100 MG capsule Take 1 capsule (100 mg total) by mouth 2 (two) times daily. 60 capsule 0  . furosemide (LASIX) 20 MG tablet Take 20 mg by mouth. As needed for elevated blood pressure    . lisinopril (PRINIVIL,ZESTRIL) 10 MG tablet Take 1 tablet (10 mg total) by mouth 2 (two) times daily. 180 tablet 1  . Multiple Vitamin (MULTIVITAMIN) capsule Take 1 capsule by mouth daily.    . pazopanib (VOTRIENT) 200 MG tablet Take 4 tablets (800 mg total) by mouth daily. Take on an empty stomach. 120 tablet 0  . potassium chloride SA (K-DUR,KLOR-CON) 20 MEQ tablet Take 1 tablet (20 mEq total) by mouth daily. (Patient taking differently: Take 10 mEq by mouth 2 (two) times daily. ) 30 tablet 9  . Turmeric 500 MG CAPS Take 500 mg by mouth daily.     Marland Kitchen ULORIC 40 MG tablet TAKE ONE TABLET BY MOUTH ONCE DAILY 30 tablet 1   No  current facility-administered medications for this visit.     Allergies:  Allergies  Allergen Reactions  . Lipitor [Atorvastatin] Other (See Comments)    Myopathy...severe, with  Myoglobinuria, wheelchair bound for a few days  . Penicillins Anaphylaxis  . Prednisone Anxiety and Other (See Comments)    Extremely high blood pressure after taking medication for first time.    Past Medical History, Surgical history, Social history, and Family History were reviewed and  updated.   Blood pressure 180/88, pulse 75, temperature 98.2 F (36.8 C), temperature source Oral, resp. rate 18, weight 203 lb 5 oz (92.222 kg), SpO2 100 %. ECOG: 0 General appearance: alert and cooperative. Well-appearing gentleman. Head: Normocephalic, without obvious abnormality Neck: no adenopathy Lymph nodes: Cervical, supraclavicular, and axillary nodes normal. Heart:regular rate and rhythm, S1, S2 normal, no murmur, click, rub or gallop Lung:chest clear, no wheezing, rales, normal symmetric air entry.  Abdomin: soft, non-tender, without masses or organomegaly. No rebound or guarding. EXT:no erythema, induration, or nodules   Lab Results: Lab Results  Component Value Date   WBC 5.4 02/02/2015   HGB 13.1 02/02/2015   HCT 40.1 02/02/2015   MCV 86.4 02/02/2015   PLT 273 02/02/2015     Chemistry      Component Value Date/Time   NA 140 02/02/2015 0922   NA 140 01/05/2015 0916   K 4.6 02/02/2015 0922   K 4.3 01/05/2015 0916   CL 105 01/05/2015 0916   CO2 23 02/02/2015 0922   CO2 30 01/05/2015 0916   BUN 30.0* 02/02/2015 0922   BUN 20 01/05/2015 0916   CREATININE 1.5* 02/02/2015 0922   CREATININE 1.41 01/05/2015 0916   CREATININE 1.28* 12/11/2014 1614      Component Value Date/Time   CALCIUM 9.8 02/02/2015 0922   CALCIUM 9.7 01/05/2015 0916   ALKPHOS 67 02/02/2015 0922   ALKPHOS 89 01/05/2015 0916   AST 26 02/02/2015 0922   AST 19 01/05/2015 0916   ALT 25 02/02/2015 0922   ALT 26 01/05/2015 0916   BILITOT 0.46 02/02/2015 0922   BILITOT 0.5 01/05/2015 0916      EXAM: CT CHEST, ABDOMEN AND PELVIS WITHOUT CONTRAST  TECHNIQUE: Multidetector CT imaging of the chest, abdomen and pelvis was performed following the standard protocol without IV contrast.  COMPARISON: CT scan 09/30/2014 and MRI abdomen 10/17/2014  FINDINGS: CT CHEST FINDINGS  Mediastinum/Lymph Nodes:  Mediastinum/Nodes: No chest wall mass, supraclavicular or axillary lymphadenopathy.  The thyroid gland is grossly normal.  The heart is normal in size. No pericardial effusion. Three-vessel coronary artery calcifications are noted. The aorta is normal in caliber. Stable surgical changes related to bypass surgery.  Small scattered mediastinal and hilar lymph nodes. The largest pretracheal node on the right side measures 10 mm on image number 21. The largest subcarinal node measures 12 mm on image number 30. No hilar adenopathy.  Lungs/Pleura: There are a few small, sub 4 mm pulmonary nodules which are indeterminate. Some of these are along the fissures and could represent lymph nodes. Attention on future scans is suggested. No acute pulmonary findings. No pleural effusion.  Musculoskeletal: No significant bony findings. No findings suspicious for osseous metastatic disease.  CT ABDOMEN PELVIS FINDINGS  Hepatobiliary: Surgical changes noted from previous hepatic metastatic lesion resection from segment 8. Moderate defect in the cortex. Small low-attenuation lesions at the hepatic dome are stable. One is a cyst and 1 is a hemangioma based on prior MRI. A few other tiny cysts are noted. No new  lesions are identified. Stable cholelithiasis. No common bile duct dilatation.  Pancreas: No mass, inflammation or ductal dilatation.  Spleen: Normal size. No focal lesions.  Adrenals/Urinary Tract: Both adrenal glands are normal and stable. The right kidney is surgically absent. Small soft tissue nodule just posterior to a surgical clip on image number 64 could be part of the colon or postoperative change. Recommend attention on future scans. The left kidney demonstrates a large cyst. No worrisome left renal lesions.  Stomach/Bowel: The stomach, duodenum, small bowel and colon are unremarkable. No inflammatory changes, mass lesions or obstructive findings.  Vascular/Lymphatic: Stable atherosclerotic calcifications involving the aorta and branch vessels. No  mesenteric or retroperitoneal mass or adenopathy. Small scattered lymph nodes are stable.  Reproductive: The bladder, prostate gland and seminal vesicles are unremarkable.  Other: No pelvic mass or adenopathy. Small external iliac lymph nodes are noted on the right side. No free pelvic fluid collections. No inguinal mass or adenopathy.  Musculoskeletal: No significant bony findings. No evidence of bony metastatic disease. Stable small sclerotic lesion in the L4 vertebral body is likely a benign bone island.  IMPRESSION: 1. Status post surgical resection of a large right renal mass and resection of a hepatic metastatic lesion. 2. Small liver lesions consistent with benign cysts and hemangiomas. No new lesions. 3. Small soft tissue nodule in the right nephrectomy bed on image number 64 could be postoperative change or part of the colon. Recommend attention on future scans. 4. No CT findings for pulmonary metastatic disease. 5. Scattered mediastinal and hilar lymph nodes without overt adenopathy. 6. Advanced atherosclerotic calcifications involving the aorta and branch vessels. Three-vessel coronary artery calcifications are noted.    Impression and Plan:  64 year old gentleman with the following issues:  1. 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 He is status post surgical resection in September 2016 and he is fully healed at this time.  CT scan on 02/02/2015 was reviewed today and discussed with the patient. He appears to have no residual disease at this time indicating stage IV NED disease.  The rationale for using systemic therapy in this particular setting was discussed again. Risks and benefits of using Votrient was also reviewed. It is unclear whether this systemic therapy as any benefit in this particular setting and the answer to this question has been answer by clinical  trial the results of which is not available at this time. After discussion today, he elected not to proceed with this therapy and defer Votrient unless we have measurable disease. The plan is to continue with surveillance CT scans every 3 months and start this medication if he develops measurable disease.  2. Renal insufficiency: His creatinine is borderline after nephrectomy and needs to be monitored moving forward. He'll not be receiving contrast with his CT scan.  3. Follow-up: Will be in 3 months after CT scan.    [facility], MD 12/23/20168:39 AM

## 2015-03-09 ENCOUNTER — Other Ambulatory Visit: Payer: Self-pay | Admitting: Internal Medicine

## 2015-03-16 DIAGNOSIS — C641 Malignant neoplasm of right kidney, except renal pelvis: Secondary | ICD-10-CM | POA: Diagnosis not present

## 2015-03-23 DIAGNOSIS — C641 Malignant neoplasm of right kidney, except renal pelvis: Secondary | ICD-10-CM | POA: Diagnosis not present

## 2015-03-23 DIAGNOSIS — R351 Nocturia: Secondary | ICD-10-CM | POA: Diagnosis not present

## 2015-03-23 DIAGNOSIS — Z0000 Encounter for general adult medical examination without abnormal findings: Secondary | ICD-10-CM | POA: Diagnosis not present

## 2015-03-27 ENCOUNTER — Other Ambulatory Visit: Payer: Self-pay | Admitting: Internal Medicine

## 2015-03-27 ENCOUNTER — Telehealth: Payer: Self-pay | Admitting: Internal Medicine

## 2015-03-27 NOTE — Telephone Encounter (Signed)
Unable to reach pt or leave a message  

## 2015-03-27 NOTE — Telephone Encounter (Signed)
Pt's wife called in stating that the pt's BP has begun to elevate and he is getting concerned. She says his Systolic is ranging anywhere from the 140's to [id number] and his dystolic is ranging from low 90's to upper 90's . Please f/u with pt  Thanks

## 2015-03-27 NOTE — Telephone Encounter (Signed)
Spoke with pt wife, for the last week or so his bp is running 150-140/90. Pt reports he just doesn't feel well. Follow up scheduled with dr hilty this week. Pt wife agreed with this plan.

## 2015-03-30 ENCOUNTER — Ambulatory Visit: Payer: Medicare Other | Admitting: Internal Medicine

## 2015-03-30 ENCOUNTER — Ambulatory Visit (INDEPENDENT_AMBULATORY_CARE_PROVIDER_SITE_OTHER): Payer: Medicare Other | Admitting: Internal Medicine

## 2015-03-30 ENCOUNTER — Encounter: Payer: Self-pay | Admitting: Internal Medicine

## 2015-03-30 VITALS — BP 158/98 | HR 69 | Ht 68.0 in | Wt 207.1 lb

## 2015-03-30 DIAGNOSIS — R918 Other nonspecific abnormal finding of lung field: Secondary | ICD-10-CM | POA: Diagnosis not present

## 2015-03-30 DIAGNOSIS — I255 Ischemic cardiomyopathy: Secondary | ICD-10-CM

## 2015-03-30 DIAGNOSIS — C649 Malignant neoplasm of unspecified kidney, except renal pelvis: Secondary | ICD-10-CM | POA: Diagnosis not present

## 2015-03-30 DIAGNOSIS — I10 Essential (primary) hypertension: Secondary | ICD-10-CM | POA: Diagnosis not present

## 2015-03-30 DIAGNOSIS — E785 Hyperlipidemia, unspecified: Secondary | ICD-10-CM

## 2015-03-30 DIAGNOSIS — R160 Hepatomegaly, not elsewhere classified: Secondary | ICD-10-CM

## 2015-03-30 DIAGNOSIS — Z951 Presence of aortocoronary bypass graft: Secondary | ICD-10-CM

## 2015-03-30 MED ORDER — FUROSEMIDE 20 MG PO TABS: 20.0000 mg | ORAL_TABLET | Freq: Every day | ORAL | Status: DC | PRN

## 2015-03-30 MED ORDER — LISINOPRIL 20 MG PO TABS: 20.0000 mg | ORAL_TABLET | Freq: Two times a day (BID) | ORAL | Status: DC

## 2015-03-30 NOTE — Patient Instructions (Signed)
Dr [name] has recommended making the following medication changes: INCREASE Lisinopril to 20 mg (1 tablet by mouth twice daily)  Your physician has requested that you have an echocardiogram. Echocardiography is a painless test that uses sound waves to create images of your heart. It provides your doctor with information about the size and shape of your heart and how well your heart's chambers and valves are working. This procedure takes approximately one hour. There are no restrictions for this procedure.  Dr [name] recommends that you schedule a follow-up appointment in 6 months. You will receive a reminder letter in the mail two months in advance. If you don't receive a letter, please call our office to schedule the follow-up appointment.  If you need a refill on your cardiac medications before your next appointment, please call your pharmacy.

## 2015-04-01 NOTE — Progress Notes (Signed)
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 Unfortunately he has an aversion to physicians and has not seen a doctor in about 40 years. He recently presented to urgent care at [facility] for progressive cough and was noted to be markedly hypertensive on presentation with a blood pressure of 205/112. Laboratory work revealed an elevated BNP of 1971. Chest x-ray demonstrated cardiomegaly without overt congestion. He denied any shortness of breath or worsening chest pain. He had been on aspirin for a long time for prophylaxis. He was recommended he start on Coreg 3.125 mg twice daily, enalapril 5 mg daily and furosemide 20 mg daily. He reports over the next couple of days a marked improvement in his cough and the fact that he lost about 3 pounds. Blood pressure is notably improved today at 130/82. EKG in the office demonstrates normal sinus rhythm with LVH by voltage at a rate of 93. There is no significant history of hypertension or heart disease in the family. Both parents had lung cancer and died of that.  Derek [name] returns today for follow-up. Unfortunately before we could perform any of the tests we've ordered he presented with chest pain the setting of steroid use for gout. This ultimately led to non-ST elevation MI and he underwent heart catheterization. Unfortunately this demonstrated multivessel coronary disease as follows:  Coronary angiography: Coronary dominance: right  Left mainstem: The left main is patent with mild 20% distal left main stenosis extending into the LAD/left circumflex bifurcation.  Left anterior descending (LAD): The LAD has 50% ostial stenosis. The vessel is diffusely diseased. There is an ulcerated area in the proximal vessel which may represent an occluded diagonal branch. There is diffuse 90% stenosis in the LAD just after the  first perforator. The mid LAD has diffuse irregularity without high-grade obstruction. The distal LAD is patent also with diffuse irregularity. The first visualized diagonal has 80-90% stenosis, but it is a tiny vessel (less than 1 mm). The second diagonal has moderate diffuse 70% stenosis. It is also relatively small vessel of approximately 1-1.5 mm.  Left circumflex (LCx): The left circumflex is critically diseased. The vessel has 95% proximal stenosis extending back to the ostium. The mid vessel has 80% stenosis. There is diffuse calcification present. There are 2 obtuse marginal branches without significant disease.  Right coronary artery (RCA): 100% occlusion of the proximal vessel. There is left to right collateral filling the PDA branch, distal RCA, and mid RCA.  Left ventriculography: There is global and segmental LV systolic dysfunction. There is mild diffuse hypokinesis of the anterolateral and apical segments. The inferior wall is akinetic from the base to the mid ventricle. The estimated LVEF is 35%.   Estimated Blood Loss: Minimal  Final Conclusions:  1. Severe three-vessel coronary artery disease with total occlusion of the right coronary artery and left-to-right collaterals, severe stenosis of the LAD, and severe diffuse stenosis of the left circumflex 2. Moderately severe segmental LV systolic dysfunction  He was then referred to for coronary artery artery bypass grafting. He underwent CABG 4 by Dr. [name] with Left internal mammary graft to the LAD, SVG to diagonal, SVG to OM, and SVG to PDA.  Postoperatively he developed another recurrence of gout in the right knee and was treated with colchicine and this is resolved. He has never had uric acid evaluation. At this  point his wife's main concern is that he is acting somewhat depressed, he is somewhat withdrawn, tearful and not as engaged in certain activities. She is concerned about postsurgical depression. He has not returned to work  and was a very active prior to surgery. He is interested in cardiac rehabilitation.  At the pleasure seeing [name] back in the office today. In the interim he seen Dr. [name] and had some changes in his medications. His last office visit I recommended starting him on Zoloft, however he felt like he was in a fog on that medication. Subsequently discontinued it and then ultimately he reported that getting back to work actually help with his depression. He seems quite bright and in good spirits today. His wife notes a marked improvement. Secondly, he has been taken off of his Lipitor. He had severe myalgias and myopathy with this medication. In fact he developed dark urine which sounds like myoglobin. He was so debilitated that the family almost had to get a wheelchair for him to get around. He is not interested in taking any further statins because of this. His symptoms did recover after a few days off of his medicine.  I saw [name] back in the office today for follow-up. He's had a difficult go and it after his bypass surgery. Unfortunately he was diagnosed with renal cell carcinoma which is metastatic. He underwent nephrectomy and lymph node dissection. There apparently was spread to the liver and he had a partial hepatic resection. Fortunately, the surgeon felt that he was successful in removing all of the tumor. Follow-up so far has revealed no recurrence. He is declined chemotherapy. In general he feels well. 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 Blood pressure appears slightly high today. He is overdue for echocardiography.  PMHx:  Past Medical History  Diagnosis Date  . Hypertension   . Gout   . NSTEMI (non-ST elevated myocardial infarction) ([place])   . Arthritis   . Shingles 7/15  . Renal cell carcinoma ([place]) 11/28/2014    Past Surgical History  Procedure Laterality Date  . Knee surgery    . Coronary artery bypass graft  N/A 01/27/2014    Procedure: CORONARY ARTERY BYPASS GRAFTING (CABG), ON PUMP, TIMES FOUR, USING LEFT INTERNAL MAMMARY ARTERY, RIGHT GREATER SAPHENOUS VEIN HARVESTED ENDOSCOPICALLY;  Surgeon: Gaye Pollack, MD;  Location: [place];  Service: Open Heart Surgery;  Laterality: N/A;  LIMA-LAD; SVG-OM; SVG-PD; SVG-DIAG  . Tee without cardioversion N/A 01/27/2014    Procedure: TRANSESOPHAGEAL ECHOCARDIOGRAM (TEE);  Surgeon: Gaye Pollack, MD;  Location: [place];  Service: Open Heart Surgery;  Laterality: N/A;  . Left heart catheterization with coronary angiogram N/A 01/23/2014    Procedure: LEFT HEART CATHETERIZATION WITH CORONARY ANGIOGRAM;  Surgeon: Blane Ohara, MD;  Location: [facility] C[facility];  Service: Cardiovascular;  Laterality: N/A;  . Open partial hepatectomy [83] N/A 11/28/2014    Procedure: OPEN PARTIAL HEPATECTOMY;  Surgeon: [name], MD;  Location: [place];  Service: General;  Laterality: N/A;  . Nephrectomy Right 11/28/2014    Procedure: RIGHT OPEN NEPHRECTOMY;  Surgeon: Cleon Gustin, MD;  Location: [place];  Service: Urology;  Laterality: Right;    FAMHx:  Family History  Problem Relation Age of Onset  . Cancer Mother   . Cancer Father     SOCHx:   reports that he has never smoked. He has never used smokeless tobacco.  He reports that he drinks alcohol. He reports that he does not use illicit drugs.  ALLERGIES:  Allergies  Allergen Reactions  . Lipitor [Atorvastatin] Other (See Comments)    Myopathy...severe, with  Myoglobinuria, wheelchair bound for a few days  . Penicillins Anaphylaxis  . Prednisone Anxiety and Other (See Comments)    Extremely high blood pressure after taking medication for first time.    ROS: A comprehensive review of systems was negative.  HOME MEDS: Current Outpatient Prescriptions  Medication Sig Dispense Refill  . aspirin EC 81 MG tablet Take 81 mg by mouth daily.    . carvedilol (COREG) 3.125 MG tablet Take 1 tablet (3.125 mg total) by mouth 2  (two) times daily with a meal. 60 tablet 6  . Cholecalciferol (VITAMIN D-3) 5000 UNITS TABS Take 5,000 Units by mouth daily.     . colchicine 0.6 MG tablet Take 1 tablet by mouth twice daily for 2 days then 1 tablet by mouth for 7 days as needed.    . docusate sodium (COLACE) 100 MG capsule Take 1 capsule (100 mg total) by mouth 2 (two) times daily. 60 capsule 0  . furosemide (LASIX) 20 MG tablet Take 1 tablet (20 mg total) by mouth daily as needed (for elevated blood pressure). 30 tablet 3  . KLOR-CON M20 20 MEQ tablet TAKE ONE TABLET BY MOUTH ONCE DAILY 30 tablet 6  . lisinopril (PRINIVIL,ZESTRIL) 20 MG tablet Take 1 tablet (20 mg total) by mouth 2 (two) times daily. 180 tablet 3  . Multiple Vitamin (MULTIVITAMIN) capsule Take 1 capsule by mouth daily.    . Turmeric 500 MG CAPS Take 500 mg by mouth daily.     Marland Kitchen ULORIC 40 MG tablet TAKE ONE TABLET BY MOUTH ONCE DAILY 30 tablet 1  . pazopanib (VOTRIENT) 200 MG tablet Take 4 tablets (800 mg total) by mouth daily. Take on an empty stomach. (Patient not taking: Reported on 03/30/2015) 120 tablet 0   No current facility-administered medications for this visit.    LABS/IMAGING: No results found for this or any previous visit (from the past 48 hour(s)). No results found.  VITALS: BP 158/98 mmHg  Pulse 69  Ht 5\' 8"  (1.727 m)  Wt 207 lb 1.6 oz (93.94 kg)  BMI 31.50 kg/m2  EXAM: General appearance: alert and no distress Neck: no carotid bruit and no JVD Lungs: clear to auscultation bilaterally Heart: regular rate and rhythm, S1, S2 normal and no S3 or S4 Abdomen: soft, non-tender; bowel sounds normal; no masses,  no organomegaly Extremities: extremities normal, atraumatic, no cyanosis or edema Pulses: 2+ and symmetric Skin: Skin color, texture, turgor normal. No rashes or lesions Neurologic: Grossly normal Psych: Pleasant mood, normal affect  EKG: Normal sinus rhythm at 69  ASSESSMENT: 1. Coronary artery disease status post CABG 4  (LIMA to LAD, SVG to OM, SVG to diagonal and SVG to PDA) - 01/2014 2. Ischemic cardiomyopathy-EF 40-45% 3. LVH by voltage 4. Hypertension-controlled 5. Post operative depression - resolved 6. Recurrent gout 7. Dyslipidemia-severe myopathy with Lipitor 8. Metastatic renal cell carcinoma-status post resection  PLAN: 1.   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 He is now status post nephrectomy and had a generous dissection as well as partial liver resection. He seems to be doing fairly well. 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 Blood pressure is still  running high and he said that was noted in his nephrologist office as well. I would like to increase his lisinopril to 20 mg twice a day. We'll have to watch renal function carefully since he has a solitary kidney. However, his recent creatinine was actually not too bad. He is due for recheck of an echocardiogram to see if he's had improvement in LV function. I'll schedule that today. Plan to see him back in 6 months.  [name], MD, [facility] Attending Cardiologist [place] Hilty 04/01/2015, 4:41 PM

## 2015-04-02 ENCOUNTER — Other Ambulatory Visit: Payer: Self-pay | Admitting: Internal Medicine

## 2015-04-02 NOTE — Telephone Encounter (Signed)
REFILL 

## 2015-04-13 ENCOUNTER — Ambulatory Visit (HOSPITAL_COMMUNITY): Payer: Medicare Other | Attending: Internal Medicine

## 2015-04-13 ENCOUNTER — Other Ambulatory Visit: Payer: Self-pay

## 2015-04-13 DIAGNOSIS — Z951 Presence of aortocoronary bypass graft: Secondary | ICD-10-CM | POA: Diagnosis not present

## 2015-04-13 DIAGNOSIS — I10 Essential (primary) hypertension: Secondary | ICD-10-CM | POA: Diagnosis not present

## 2015-04-13 DIAGNOSIS — I517 Cardiomegaly: Secondary | ICD-10-CM | POA: Insufficient documentation

## 2015-04-13 DIAGNOSIS — I255 Ischemic cardiomyopathy: Secondary | ICD-10-CM

## 2015-04-16 ENCOUNTER — Other Ambulatory Visit: Payer: Self-pay | Admitting: Medical

## 2015-05-03 ENCOUNTER — Telehealth: Payer: Self-pay

## 2015-05-03 NOTE — Telephone Encounter (Signed)
Patient's wife calling stating that patient never received a call from scheduling regarding scans Dr. Alen Blew wanted patient to have.  Writer called central scheduling and they stated that it never showed up on their work que.  Scheduling to call patient to offer scan to be preformed on 05/07/15 at 130- also to give instructions for scan.

## 2015-05-04 ENCOUNTER — Other Ambulatory Visit: Payer: Self-pay | Admitting: Medical

## 2015-05-11 ENCOUNTER — Ambulatory Visit: Payer: Medicare Other | Admitting: Oncology

## 2015-05-11 ENCOUNTER — Ambulatory Visit (HOSPITAL_COMMUNITY)
Admission: RE | Admit: 2015-05-11 | Discharge: 2015-05-11 | Disposition: A | Discharge status: Home / Self Care | Payer: Medicare Other | Source: Ambulatory Visit | Attending: Oncology | Admitting: Oncology

## 2015-05-11 ENCOUNTER — Other Ambulatory Visit (HOSPITAL_BASED_OUTPATIENT_CLINIC_OR_DEPARTMENT_OTHER): Payer: Medicare Other

## 2015-05-11 DIAGNOSIS — Z85520 Personal history of malignant carcinoid tumor of kidney: Secondary | ICD-10-CM | POA: Diagnosis not present

## 2015-05-11 DIAGNOSIS — N281 Cyst of kidney, acquired: Secondary | ICD-10-CM | POA: Insufficient documentation

## 2015-05-11 DIAGNOSIS — N400 Enlarged prostate without lower urinary tract symptoms: Secondary | ICD-10-CM | POA: Insufficient documentation

## 2015-05-11 DIAGNOSIS — Z905 Acquired absence of kidney: Secondary | ICD-10-CM | POA: Diagnosis not present

## 2015-05-11 DIAGNOSIS — R918 Other nonspecific abnormal finding of lung field: Secondary | ICD-10-CM

## 2015-05-11 DIAGNOSIS — C649 Malignant neoplasm of unspecified kidney, except renal pelvis: Secondary | ICD-10-CM | POA: Insufficient documentation

## 2015-05-11 DIAGNOSIS — K769 Liver disease, unspecified: Secondary | ICD-10-CM | POA: Diagnosis not present

## 2015-05-11 DIAGNOSIS — R160 Hepatomegaly, not elsewhere classified: Secondary | ICD-10-CM

## 2015-05-11 DIAGNOSIS — K8020 Calculus of gallbladder without cholecystitis without obstruction: Secondary | ICD-10-CM | POA: Diagnosis not present

## 2015-05-11 LAB — CBC WITH DIFFERENTIAL/PLATELET
BASO%: 0.5 % (ref 0.0–2.0)
Basophils Absolute: 0 10*3/uL (ref 0.0–0.1)
EOS%: 3.5 % (ref 0.0–7.0)
Eosinophils Absolute: 0.2 10*3/uL (ref 0.0–0.5)
HEMATOCRIT: 40 % (ref 38.4–49.9)
HGB: 13.3 g/dL (ref 13.0–17.1)
LYMPH#: 0.9 10*3/uL (ref 0.9–3.3)
LYMPH%: 15.5 % (ref 14.0–49.0)
MCH: 29.2 pg (ref 27.2–33.4)
MCHC: 33.1 g/dL (ref 32.0–36.0)
MCV: 88.2 fL (ref 79.3–98.0)
MONO#: 0.5 10*3/uL (ref 0.1–0.9)
MONO%: 9.6 % (ref 0.0–14.0)
NEUT#: 4 10*3/uL (ref 1.5–6.5)
NEUT%: 70.9 % (ref 39.0–75.0)
Platelets: 215 10*3/uL (ref 140–400)
RBC: 4.54 10*6/uL (ref 4.20–5.82)
RDW: 14.2 % (ref 11.0–14.6)
WBC: 5.6 10*3/uL (ref 4.0–10.3)

## 2015-05-11 LAB — COMPREHENSIVE METABOLIC PANEL
ALK PHOS: 68 U/L (ref 40–150)
ALT: 17 U/L (ref 0–55)
ANION GAP: 10 meq/L (ref 3–11)
AST: 17 U/L (ref 5–34)
Albumin: 3.8 g/dL (ref 3.5–5.0)
BILIRUBIN TOTAL: 0.47 mg/dL (ref 0.20–1.20)
BUN: 24.1 mg/dL (ref 7.0–26.0)
CO2: 24 meq/L (ref 22–29)
Calcium: 9.4 mg/dL (ref 8.4–10.4)
Chloride: 107 mEq/L (ref 98–109)
Creatinine: 1.3 mg/dL (ref 0.7–1.3)
EGFR: 55 mL/min/{1.73_m2} — AB (ref >90)
Glucose: 101 mg/dl (ref 70–140)
Potassium: 4.7 mEq/L (ref 3.5–5.1)
Sodium: 141 mEq/L (ref 136–145)
TOTAL PROTEIN: 7.5 g/dL (ref 6.4–8.3)

## 2015-05-18 ENCOUNTER — Telehealth: Payer: Self-pay | Admitting: Oncology

## 2015-05-18 ENCOUNTER — Ambulatory Visit (HOSPITAL_BASED_OUTPATIENT_CLINIC_OR_DEPARTMENT_OTHER): Payer: Medicare Other | Admitting: Oncology

## 2015-05-18 ENCOUNTER — Telehealth: Payer: Self-pay | Admitting: Pharmacist

## 2015-05-18 ENCOUNTER — Encounter: Payer: Self-pay | Admitting: *Deleted

## 2015-05-18 VITALS — BP 163/86 | HR 88 | Temp 98.6°F | Resp 18 | Ht 68.0 in | Wt 207.7 lb

## 2015-05-18 DIAGNOSIS — C649 Malignant neoplasm of unspecified kidney, except renal pelvis: Secondary | ICD-10-CM

## 2015-05-18 DIAGNOSIS — N289 Disorder of kidney and ureter, unspecified: Secondary | ICD-10-CM | POA: Diagnosis not present

## 2015-05-18 DIAGNOSIS — C641 Malignant neoplasm of right kidney, except renal pelvis: Secondary | ICD-10-CM | POA: Diagnosis not present

## 2015-05-18 DIAGNOSIS — C786 Secondary malignant neoplasm of retroperitoneum and peritoneum: Secondary | ICD-10-CM | POA: Diagnosis not present

## 2015-05-18 DIAGNOSIS — C787 Secondary malignant neoplasm of liver and intrahepatic bile duct: Secondary | ICD-10-CM | POA: Diagnosis not present

## 2015-05-18 MED ORDER — PAZOPANIB HCL 200 MG PO TABS: 800.0000 mg | ORAL_TABLET | Freq: Every day | ORAL | Status: DC

## 2015-05-18 NOTE — Telephone Encounter (Signed)
3/17: New Rx for Votrient sent to [facility] outpatient pharmacy. Pt previously with high copay last year before deciding to hold off on treatment. Will check on cost again and may need to apply for manufacturer assistance.

## 2015-05-18 NOTE — Progress Notes (Signed)
Hematology and Oncology Follow Up Visit  Derek [name] [id number] [birth year] 65 y.o. 05/18/2015 1:39 PM   Principle Diagnosis: 65 year old with renal cell carcinoma. He was diagnosed in August 2016 measuring 15.3 x 10.9 x 3.0 cm in the right kidney. He has retroperitoneal lymphadenopathy and a 2.7 cm liver mass indicating stage IV disease.    Prior Therapy: He is S/P right open radical nephrectomy and retroperitoneal lymph node dissection done on 11/28/2014. He also had partial right hepatectomy.  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 Metastatic lymph node involvement noted in the sampled lymph nodes. He also had involvement of renal cell carcinoma in the liver resection sample.  Current therapy: Under consideration for systemic therapy.   Interim History:  [name] presents today for a follow-up visit. Since the last visit, he continues to do very well and completely asymptomatic. He does report some mild fatigue and decrease in his stamina but have recovered from his operation. He has resumed work-related duties and currently works 10 hours a day. No severe pain or any other complaints at this time. Has not reported any cough or hemoptysis. Has not reported any other constitutional symptoms. He does not report any hematuria or dysuria. His blood pressure have improved as of late.  He does not report any headaches, blurry vision, syncope or seizures. He does not report any fevers, chills, sweats weight loss or appetite changes. He does not report any chest pain, palpitation, orthopnea. He does not report any cough, wheezing or dyspnea on exertion. He does not report any nausea, vomiting, abdominal pain, flank pain, or hematochezia. He does not report any frequency, urgency or hesitancy. He does not report any skeletal complaints of arthralgias or myalgias. Does not report any lymphadenopathy or petechiae. Remaining review of systems  unremarkable  Medications: I have reviewed the patient's current medications.  Current Outpatient Prescriptions  Medication Sig Dispense Refill  . aspirin EC 81 MG tablet Take 81 mg by mouth daily.    . carvedilol (COREG) 3.125 MG tablet TAKE ONE TABLET BY MOUTH TWICE DAILY WITH MEALS 60 tablet 6  . Cholecalciferol (VITAMIN D-3) 5000 UNITS TABS Take 5,000 Units by mouth daily.     . colchicine 0.6 MG tablet Take 1 tablet by mouth twice daily for 2 days then 1 tablet by mouth for 7 days as needed.    . docusate sodium (COLACE) 100 MG capsule Take 1 capsule (100 mg total) by mouth 2 (two) times daily. 60 capsule 0  . furosemide (LASIX) 20 MG tablet Take 1 tablet (20 mg total) by mouth daily as needed (for elevated blood pressure). 30 tablet 3  . KLOR-CON M20 20 MEQ tablet TAKE ONE TABLET BY MOUTH ONCE DAILY 30 tablet 6  . lisinopril (PRINIVIL,ZESTRIL) 20 MG tablet Take 1 tablet (20 mg total) by mouth 2 (two) times daily. 180 tablet 3  . Multiple Vitamin (MULTIVITAMIN) capsule Take 1 capsule by mouth daily.    . pazopanib (VOTRIENT) 200 MG tablet Take 4 tablets (800 mg total) by mouth daily. Take on an empty stomach. 120 tablet 0  . Turmeric 500 MG CAPS Take 500 mg by mouth daily.     Marland Kitchen ULORIC 40 MG tablet TAKE ONE TABLET BY MOUTH ONCE DAILY 30 tablet 0  . pazopanib (VOTRIENT) 200 MG tablet Take 4 tablets (800 mg total) by mouth daily. Take on an empty stomach. 120 tablet 0   No current facility-administered  medications for this visit.     Allergies:  Allergies  Allergen Reactions  . Lipitor [Atorvastatin] Other (See Comments)    Myopathy...severe, with  Myoglobinuria, wheelchair bound for a few days  . Penicillins Anaphylaxis  . Prednisone Anxiety and Other (See Comments)    Extremely high blood pressure after taking medication for first time.    Past Medical History, Surgical history, Social history, and Family History were reviewed and updated.   Blood pressure 163/86, pulse 88,  temperature 98.6 F (37 C), temperature source Oral, resp. rate 18, height 5\' 8"  (1.727 m), weight 207 lb 11.2 oz (94.212 kg), SpO2 99 %. ECOG: 0 General appearance: alert and cooperative. Not in any distress. Head: Normocephalic, without obvious abnormality no ulcers or lesions. Neck: no adenopathy Lymph nodes: Cervical, supraclavicular, and axillary nodes normal. Heart:regular rate and rhythm, S1, S2 normal, no murmur, click, rub or gallop Lung:chest clear, no wheezing, rales, normal symmetric air entry.  Abdomin: soft, non-tender, without masses or organomegaly. No rebound or guarding. EXT:no erythema, induration, or nodules   Lab Results: Lab Results  Component Value Date   WBC 5.6 05/11/2015   HGB 13.3 05/11/2015   HCT 40.0 05/11/2015   MCV 88.2 05/11/2015   PLT 215 05/11/2015     Chemistry      Component Value Date/Time   NA 141 05/11/2015 0759   NA 140 01/05/2015 0916   K 4.7 05/11/2015 0759   K 4.3 01/05/2015 0916   CL 105 01/05/2015 0916   CO2 24 05/11/2015 0759   CO2 30 01/05/2015 0916   BUN 24.1 05/11/2015 0759   BUN 20 01/05/2015 0916   CREATININE 1.3 05/11/2015 0759   CREATININE 1.41 01/05/2015 0916   CREATININE 1.28* 12/11/2014 1614      Component Value Date/Time   CALCIUM 9.4 05/11/2015 0759   CALCIUM 9.7 01/05/2015 0916   ALKPHOS 68 05/11/2015 0759   ALKPHOS 89 01/05/2015 0916   AST 17 05/11/2015 0759   AST 19 01/05/2015 0916   ALT 17 05/11/2015 0759   ALT 26 01/05/2015 0916   BILITOT 0.47 05/11/2015 0759   BILITOT 0.5 01/05/2015 0916     EXAM: CT CHEST, ABDOMEN AND PELVIS WITHOUT CONTRAST  TECHNIQUE: Multidetector CT imaging of the chest, abdomen and pelvis was performed following the standard protocol without IV contrast.  COMPARISON: 02/02/2015  FINDINGS: CT CHEST FINDINGS  Mediastinum/Lymph Nodes: Shotty mediastinal lymph nodes remain stable, with largest nodes in the right paratracheal region measuring 10 mm on image 23, and  in the subcarinal region measuring 12 mm on image 33. No new or increased areas of lymphadenopathy seen on this unenhanced exam.  Lungs/Pleura: Several tiny less than 5 mm scattered pulmonary nodules are again seen in the right midlung which are stable. Several of these are perifissural location may represent intrapulmonary lymph nodes. These were obtained indeterminate. No new or enlarging pulmonary nodules or masses are identified. No evidence of pleural effusion.  Musculoskeletal: No chest wall mass or suspicious bone lesions identified.  CT ABDOMEN PELVIS FINDINGS  Hepatobiliary: Postop changes from previous partial hepatectomy remains stable. Several tiny low-attenuation liver lesions remain stable which were shown to represent tiny cysts and hemangioma on prior MRI. No new or enlarging liver lesions seen on this noncontrast study. Cholelithiasis is again demonstrated, without evidence of cholecystitis or biliary ductal dilatation.  Pancreas: No mass or inflammatory process identified on this un-enhanced exam.  Spleen: Within normal limits in size.  Adrenals/Urinary Tract: No adrenal masses are  identified. Significant increase in size of soft tissue mass seen in the right nephrectomy bed which involves the adjacent right colon and psoas muscle. This measures 4.7 x 6.5 cm on image 68/ series 2 compared to approximately 1 cm previously. This is consistent with recurrent carcinoma.  Fluid attenuation left renal cyst remains stable. No evidence of left-sided hydronephrosis.  Stomach/Bowel: No evidence of obstruction, inflammatory process, or abnormal fluid collections.  Vascular/Lymphatic: No pathologically enlarged lymph nodes. No evidence of abdominal aortic aneurysm.  Reproductive: Mildly enlarged prostate gland and mild diffuse bladder wall thickening, likely due to chronic bladder outlet obstruction.  Other: None.  Musculoskeletal: No suspicious  bone lesions identified.  IMPRESSION: Marked increase in size of soft tissue mass in right nephrectomy bed, consistent with locally recurrent carcinoma. No other sites of recurrent or metastatic disease identified within the abdomen or pelvis.  Stable shotty mediastinal lymph nodes and tiny indeterminate right lung nodules, which are indeterminate. Continued attention recommended on follow-up imaging.    Impression and Plan:  65 year old gentleman with the following issues:  1. 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 He is status post surgical resection in September 2016 and he is fully healed at this time.  CT scan on 05/11/2015 was reviewed and discussed with the patient today. His disease appeared to have relapse with a mass in the right nephrectomy bed which is undoubtedly related to kidney cancer.  The rationale for using systemic therapy in this particular setting was discussed again. Risks and benefits of using Votrient was also reviewed. Complications include hypertension, diarrhea among others were reviewed again and he is ready to proceed. He was started on 800 mg daily and will adjust the dose if needed to.  2. Renal insufficiency: His creatinine is borderline after nephrectomy and needs to be monitored moving forward.   3. Hypertension: His blood pressure medication might be adjusted in the future if he develops worsening high blood pressure.  4. Liver function prophylaxis: His LFTs will be monitored closely on Votrient.  5. Follow-up: Will be in one month to monitor for any complications.    [name], MD 3/17/20171:39 PM

## 2015-05-18 NOTE — Telephone Encounter (Signed)
Gave and printed appt sched and avs for pt for April °

## 2015-05-21 ENCOUNTER — Telehealth: Payer: Self-pay | Admitting: Pharmacist

## 2015-05-21 NOTE — Telephone Encounter (Signed)
Called pt and s/w his wife re: [employer] Patient Assistance enrollment. They will come to [facility] this Friday AM to sign paperwork and bring in statement of income.  They will s/w Montel Clock, Pharm.D.  Kennith Center, Pharm.D., CPP 05/21/2015@4 :52 PM

## 2015-05-25 MED FILL — *VOTRIENT 200 MG TABLET: 200 | 30 days supply | Qty: 120 | Fill #0

## 2015-05-25 NOTE — Telephone Encounter (Signed)
Oral Chemotherapy Pharmacist Encounter   Counseled patient Votrient and went over proper administration, dosing, side effects, safe handling, and monitoring. Side effects include but not limited to: fatigue, nausea, vomiting, rash, diarrhea, hypertension, and headache. [name] voiced understanding and appreciation.   All questions answered.  Rx will be picked up and started on 3/27 from [facility]. Copay assistance approved through [place].   Thank you,  Montel Clock, PharmD, [facility]

## 2015-05-25 NOTE — Telephone Encounter (Signed)
3/24: Patient applied for and approved for [place] for renal cell cancer for $11,000. This should cover patient for the 2017 year (grant will expire on 05/23/16).   Rx ordered by [place] and patient will pick up and start on Monday 05/28/15   PANF patient info:  ID: [id number] RXGRP: [id number] RxBin: [id number]  Thank you,  Montel Clock, PharmD, [facility]

## 2015-06-01 ENCOUNTER — Telehealth: Payer: Self-pay | Admitting: Pharmacist

## 2015-06-01 NOTE — Telephone Encounter (Signed)
Attempted to call pts home# - no answer. Left a vm on pts wife Jolayne Haines) cell #  Asked them to call [facility] to discuss starting Votrient (05/28/15). Kennith Center, Pharm.D., CPP 06/01/2015@9 :44 AM

## 2015-06-04 ENCOUNTER — Encounter: Payer: Self-pay | Admitting: Pharmacist

## 2015-06-04 ENCOUNTER — Telehealth: Payer: Self-pay

## 2015-06-04 NOTE — Telephone Encounter (Signed)
Per Dr. Alen Blew, I spoke with the wife and instructed her to do the following: 1.) keep checking the BP daily, 2.) record BP readings for the next few weeks 3.) informed her that Votrient does not harm the kidneys (patient has only one kidney), 4.) bring the BP readings to the next office visit scheduled in April, and 5.) BP medications can be adjusted if needed to. We can check with cardiologists before changing. Wife verbalized understanding.

## 2015-06-04 NOTE — Progress Notes (Signed)
Oral Chemotherapy Follow-Up Form  Original Start date of oral chemotherapy: _3/28/17_   Called patient today to follow up regarding patient's oral chemotherapy medication: _Votrient___  Pt is doing well today. Tolerating Votrient well. He has been on the medication for one week. The only issue is his BP is starting to increase. He is taking his BP medication regularly as prescribed. This could be from the [facility]. Per Dr. Alen Blew: no changes currently. [name] will continue votrient 800 mg daily and record BP readings every day. He will follow up with Dr. Alen Blew in a few weeks. Adjustments to BP medications may be made at that time depending on BP results and recommendations from cardiologist. Dr. Reesa Chew wife will call if BP continues to worsen  Pt reports __0__ tablets/doses missed in the last week.    Pt reports the following side effects: __increased BP____   Will follow up and call patient again in __1 week____   Thank you,  Montel Clock, PharmD, [facility]

## 2015-06-04 NOTE — Telephone Encounter (Signed)
Patient started pazopanib on 05/29/15.  Patient's wife Tammi Klippel states that last week his BP increased, by the end of the week she states "his bottom number was over 100".  Tammi Klippel states that patient is very nervous because he only has one kidney. Patient's wife would like a call from RN or Dr. Alen Blew with advice.  She is wondering if Dr. Alen Blew should contact patient's cardiologist.

## 2015-06-11 ENCOUNTER — Other Ambulatory Visit: Payer: Self-pay | Admitting: Medical

## 2015-06-11 ENCOUNTER — Encounter: Payer: Self-pay | Admitting: Pharmacist

## 2015-06-11 NOTE — Progress Notes (Signed)
Oral Chemotherapy Follow-Up Form  Original Start date of oral chemotherapy: _3/28/17__   Called patient today to follow up regarding patient's oral chemotherapy medication: _Votrient__  Pt is doing well today with no complaints other than fatigue. He is still working 40 hours a week and had a beach trip this weekend that wore him out according to his wife, Jolayne Haines. Spoke with patient's wife on the phone today. Patient's BP is stable and not getting worse so this is a good sign.   Pt reports _0_ tablets/doses missed in the last week.    Pt reports the following side effects: _fatigue__    Will follow up and call patient again in _1 week. Patient will need refill called in end of next week for Votrient to [facility] outpatient pharmacy___   Thank you,  Montel Clock, PharmD, [facility]

## 2015-06-21 ENCOUNTER — Encounter: Payer: Self-pay | Admitting: Pharmacist

## 2015-06-21 ENCOUNTER — Other Ambulatory Visit: Payer: Self-pay | Admitting: Oncology

## 2015-06-21 DIAGNOSIS — C649 Malignant neoplasm of unspecified kidney, except renal pelvis: Secondary | ICD-10-CM

## 2015-06-21 MED ORDER — PAZOPANIB HCL 200 MG PO TABS: 800.0000 mg | ORAL_TABLET | Freq: Every day | ORAL | Status: DC

## 2015-06-21 MED FILL — *VOTRIENT 200 MG TABLET: 200 | 30 days supply | Qty: 120 | Fill #0

## 2015-06-21 NOTE — Progress Notes (Signed)
Oral Chemotherapy Follow-Up Form  Original Start date of oral chemotherapy: _3/28/17__   Called patient today to follow up regarding patient's oral chemotherapy medication: _Votrient__  Pt is doing well today with no complaints other than fatigue. He is still working 40 hours, although this may be getting difficult for him at this point. Spoke with patient's wife on the phone today. Patient's BP still will fluctuate up and down. They are monitoring daily and he is taking his blood pressure meds every day. May need to have his PCP adjust these if Blood pressure continues to fluctuate.   Pt reports _0_ tablets/doses missed in the last week.    Pt reports the following side effects: _fatigue and fluctuation in BP__  New Refill called in to [facility] so patient can pick up tomorrow when he is here for follow up visit  Will follow up and call patient again in _3 weeks___   Thank you,  Montel Clock, PharmD, [facility]

## 2015-06-22 ENCOUNTER — Encounter: Payer: Self-pay | Admitting: Oncology

## 2015-06-22 ENCOUNTER — Other Ambulatory Visit (HOSPITAL_BASED_OUTPATIENT_CLINIC_OR_DEPARTMENT_OTHER): Payer: Medicare Other

## 2015-06-22 ENCOUNTER — Telehealth: Payer: Self-pay | Admitting: Oncology

## 2015-06-22 ENCOUNTER — Ambulatory Visit (HOSPITAL_BASED_OUTPATIENT_CLINIC_OR_DEPARTMENT_OTHER): Payer: Medicare Other | Admitting: Oncology

## 2015-06-22 VITALS — BP 177/93 | HR 67 | Temp 98.6°F | Resp 18 | Ht 68.0 in | Wt 209.8 lb

## 2015-06-22 DIAGNOSIS — I10 Essential (primary) hypertension: Secondary | ICD-10-CM

## 2015-06-22 DIAGNOSIS — C787 Secondary malignant neoplasm of liver and intrahepatic bile duct: Secondary | ICD-10-CM

## 2015-06-22 DIAGNOSIS — C641 Malignant neoplasm of right kidney, except renal pelvis: Secondary | ICD-10-CM

## 2015-06-22 DIAGNOSIS — C786 Secondary malignant neoplasm of retroperitoneum and peritoneum: Secondary | ICD-10-CM | POA: Diagnosis not present

## 2015-06-22 DIAGNOSIS — N289 Disorder of kidney and ureter, unspecified: Secondary | ICD-10-CM | POA: Diagnosis not present

## 2015-06-22 DIAGNOSIS — C649 Malignant neoplasm of unspecified kidney, except renal pelvis: Secondary | ICD-10-CM

## 2015-06-22 LAB — COMPREHENSIVE METABOLIC PANEL
ALT: 15 U/L (ref 0–55)
AST: 17 U/L (ref 5–34)
Albumin: 3.6 g/dL (ref 3.5–5.0)
Alkaline Phosphatase: 71 U/L (ref 40–150)
Anion Gap: 9 mEq/L (ref 3–11)
BUN: 21.4 mg/dL (ref 7.0–26.0)
CALCIUM: 9.5 mg/dL (ref 8.4–10.4)
CHLORIDE: 107 meq/L (ref 98–109)
CO2: 24 mEq/L (ref 22–29)
Creatinine: 1.4 mg/dL — ABNORMAL HIGH (ref 0.7–1.3)
EGFR: 51 mL/min/{1.73_m2} — AB (ref >90)
Glucose: 108 mg/dl (ref 70–140)
POTASSIUM: 4.9 meq/L (ref 3.5–5.1)
Sodium: 140 mEq/L (ref 136–145)
Total Bilirubin: 0.48 mg/dL (ref 0.20–1.20)
Total Protein: 7.2 g/dL (ref 6.4–8.3)

## 2015-06-22 LAB — CBC WITH DIFFERENTIAL/PLATELET
BASO%: 0.4 % (ref 0.0–2.0)
BASOS ABS: 0 10*3/uL (ref 0.0–0.1)
EOS%: 2.8 % (ref 0.0–7.0)
Eosinophils Absolute: 0.1 10*3/uL (ref 0.0–0.5)
HEMATOCRIT: 40.8 % (ref 38.4–49.9)
HGB: 13.5 g/dL (ref 13.0–17.1)
LYMPH#: 0.9 10*3/uL (ref 0.9–3.3)
LYMPH%: 20.2 % (ref 14.0–49.0)
MCH: 29.7 pg (ref 27.2–33.4)
MCHC: 33.1 g/dL (ref 32.0–36.0)
MCV: 89.8 fL (ref 79.3–98.0)
MONO#: 0.5 10*3/uL (ref 0.1–0.9)
MONO%: 12.1 % (ref 0.0–14.0)
NEUT#: 2.8 10*3/uL (ref 1.5–6.5)
NEUT%: 64.5 % (ref 39.0–75.0)
Platelets: 189 10*3/uL (ref 140–400)
RBC: 4.54 10*6/uL (ref 4.20–5.82)
RDW: 14.8 % — ABNORMAL HIGH (ref 11.0–14.6)
WBC: 4.4 10*3/uL (ref 4.0–10.3)

## 2015-06-22 NOTE — Telephone Encounter (Signed)
Gave pt appt for may & avs.. Pt 5/19 @ 9:15 per provider

## 2015-06-22 NOTE — Progress Notes (Signed)
patient/wife want copied mailed to them and faxed- left for dr. to sign

## 2015-06-22 NOTE — Progress Notes (Signed)
Faxed [phone] and mailed copy to patient. Sent to medical records

## 2015-06-22 NOTE — Progress Notes (Signed)
Hematology and Oncology Follow Up Visit  Avimael Griscom [id number] 06-Jun-1950 65 y.o. 06/22/2015 9:29 AM   Principle Diagnosis: 65 year old with renal cell carcinoma. He was diagnosed in August 2016 measuring 15.3 x 10.9 x 3.0 cm in the right kidney. He has retroperitoneal lymphadenopathy and a 2.7 cm liver mass indicating stage IV disease.    Prior Therapy: He is S/P right open radical nephrectomy and retroperitoneal lymph node dissection done on 11/28/2014. He also had partial right hepatectomy.  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 Metastatic lymph node involvement noted in the sampled lymph nodes. He also had involvement of renal cell carcinoma in the liver resection sample.  Current therapy: Votrient 800 mg daily started on 05/29/2015.  Interim History:  Mr. Zeppieri presents today for a follow-up visit. Since the last visit, he started Votrient and have tolerated it fairly well. He denied major side effects but does report grade 1 fatigue and grade 1 diarrhea that has been episodic in nature. He does report increase in his blood pressure however. His systolic blood pressure have gland between 150 280 at different times in the last 2 weeks. His diastolic also elevated around [id number] systolic. He does take blood pressure medications including Norvasc and Coreg.   Despite that, he continues to work 4 days a week and no issues continuing to do so. No severe pain or any other complaints at this time. Has not reported any cough or hemoptysis. Has not reported any other constitutional symptoms. He does not report any hematuria or dysuria.   He does not report any headaches, blurry vision, syncope or seizures. He does not report any fevers, chills, sweats weight loss or appetite changes. He does not report any chest pain, palpitation, orthopnea. He does not report any cough, wheezing or dyspnea on exertion. He does not report any nausea, vomiting,  abdominal pain, flank pain, or hematochezia. He does not report any frequency, urgency or hesitancy. He does not report any skeletal complaints of arthralgias or myalgias. Does not report any lymphadenopathy or petechiae. Remaining review of systems unremarkable  Medications: I have reviewed the patient's current medications.  Current Outpatient Prescriptions  Medication Sig Dispense Refill  . aspirin EC 81 MG tablet Take 81 mg by mouth daily.    . carvedilol (COREG) 3.125 MG tablet TAKE ONE TABLET BY MOUTH TWICE DAILY WITH MEALS 60 tablet 6  . Cholecalciferol (VITAMIN D-3) 5000 UNITS TABS Take 5,000 Units by mouth daily.     . colchicine 0.6 MG tablet Take 1 tablet by mouth twice daily for 2 days then 1 tablet by mouth for 7 days as needed.    . docusate sodium (COLACE) 100 MG capsule Take 1 capsule (100 mg total) by mouth 2 (two) times daily. 60 capsule 0  . furosemide (LASIX) 20 MG tablet Take 1 tablet (20 mg total) by mouth daily as needed (for elevated blood pressure). 30 tablet 3  . KLOR-CON M20 20 MEQ tablet TAKE ONE TABLET BY MOUTH ONCE DAILY 30 tablet 6  . lisinopril (PRINIVIL,ZESTRIL) 20 MG tablet Take 1 tablet (20 mg total) by mouth 2 (two) times daily. 180 tablet 3  . Multiple Vitamin (MULTIVITAMIN) capsule Take 1 capsule by mouth daily.    . pazopanib (VOTRIENT) 200 MG tablet Take 4 tablets (800 mg total) by mouth daily. Take on an empty stomach. 120 tablet 0  . Turmeric 500 MG CAPS Take 500 mg by mouth daily.     Marland Kitchen  ULORIC 40 MG tablet TAKE ONE TABLET BY MOUTH ONCE DAILY 30 tablet 0   No current facility-administered medications for this visit.     Allergies:  Allergies  Allergen Reactions  . Lipitor [Atorvastatin] Other (See Comments)    Myopathy...severe, with  Myoglobinuria, wheelchair bound for a few days  . Penicillins Anaphylaxis  . Prednisone Anxiety and Other (See Comments)    Extremely high blood pressure after taking medication for first time.    Past Medical  History, Surgical history, Social history, and Family History were reviewed and updated.   Blood pressure 177/93, pulse 67, temperature 98.6 F (37 C), temperature source Oral, resp. rate 18, height 5\' 8"  (1.727 m), weight 209 lb 12.8 oz (95.165 kg), SpO2 100 %. ECOG: 0 General appearance: Alert, awake gentleman without distress. Head: Normocephalic, without obvious abnormality no oral thrush. Neck: no adenopathy Lymph nodes: Cervical, supraclavicular, and axillary nodes normal. Heart:regular rate and rhythm, S1, S2 normal, no murmur, click, rub or gallop Lung:chest clear, no wheezing, rales, normal symmetric air entry.  Abdomin: soft, non-tender, without masses or organomegaly. No shifting dullness or ascites. EXT:no erythema, induration, or nodules   Lab Results: Lab Results  Component Value Date   WBC 4.4 06/22/2015   HGB 13.5 06/22/2015   HCT 40.8 06/22/2015   MCV 89.8 06/22/2015   PLT 189 06/22/2015     Chemistry      Component Value Date/Time   NA 140 06/22/2015 0833   NA 140 01/05/2015 0916   K 4.9 06/22/2015 0833   K 4.3 01/05/2015 0916   CL 105 01/05/2015 0916   CO2 24 06/22/2015 0833   CO2 30 01/05/2015 0916   BUN 21.4 06/22/2015 0833   BUN 20 01/05/2015 0916   CREATININE 1.4* 06/22/2015 0833   CREATININE 1.41 01/05/2015 0916   CREATININE 1.28* 12/11/2014 1614      Component Value Date/Time   CALCIUM 9.5 06/22/2015 0833   CALCIUM 9.7 01/05/2015 0916   ALKPHOS 71 06/22/2015 0833   ALKPHOS 89 01/05/2015 0916   AST 17 06/22/2015 0833   AST 19 01/05/2015 0916   ALT 15 06/22/2015 0833   ALT 26 01/05/2015 0916   BILITOT 0.48 06/22/2015 0833   BILITOT 0.5 01/05/2015 0916         Impression and Plan:  65 year old gentleman with the following issues:  1. 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 He is status post surgical resection in September 2016 and he is  fully healed at this time.  CT scan on 05/11/2015 showed relapsed disease with a mass in the right nephrectomy bed which is undoubtedly related to kidney cancer.  He is currently on Votrient and have taken for about 3 weeks. He tolerated this medication well except for mild fatigue and diarrhea. He is experiencing increased her blood pressure and I recommended reducing the dose to 600 mg daily for the time being for better tolerance. The plan is to repeat CT scan in 2 months.  2. Renal insufficiency: His creatinine is borderline after nephrectomy and needs to be monitored moving forward.   3. Hypertension: Reducing the Votrient dose might help but his blood pressure medication needs to be adjusted. It is possible that he might require either increase his lisinopril dose or adding a different agent to control his blood pressure.  4. Liver function prophylaxis: His LFTs will be monitored closely on Votrient.  5. Follow-up: Will be in  one month to monitor for any complications.    [facility], MD 4/21/20179:29 AM

## 2015-07-02 ENCOUNTER — Telehealth: Payer: Self-pay | Admitting: *Deleted

## 2015-07-02 NOTE — Telephone Encounter (Signed)
Received voice message from wife that patient's blood pressure is still elevated after Dr. Alen Blew decreased Votrient to three tablets a day (600 mg). I want Dr. Alen Blew to have these blood pressure readings. This morning it was 156/102 and Sunday morning 175/108. The systolic reading has been ranging from [id number] and dystolic reading has been 89-111. My return number is [phone].

## 2015-07-06 ENCOUNTER — Telehealth: Payer: Self-pay | Admitting: Internal Medicine

## 2015-07-06 DIAGNOSIS — C641 Malignant neoplasm of right kidney, except renal pelvis: Secondary | ICD-10-CM | POA: Diagnosis not present

## 2015-07-06 DIAGNOSIS — R160 Hepatomegaly, not elsewhere classified: Secondary | ICD-10-CM | POA: Diagnosis not present

## 2015-07-06 DIAGNOSIS — Z0000 Encounter for general adult medical examination without abnormal findings: Secondary | ICD-10-CM | POA: Diagnosis not present

## 2015-07-06 DIAGNOSIS — R351 Nocturia: Secondary | ICD-10-CM | POA: Diagnosis not present

## 2015-07-06 NOTE — Telephone Encounter (Signed)
New MESsage  Pt wife calling to speak w/ RN- pt was seen by Dr Alyson Ingles (urology) and wanted to change pt's lisinopril- Please call back and discuss.

## 2015-07-06 NOTE — Telephone Encounter (Signed)
Returned call to wife. She notes patient saw urologist recently, who wanted to request cardiology to change medication (lisinopril, & possibly carvedilol) Explains pt's urologist has concern about pt's kidney function on his 1 remaining kidney. Dr. Alyson Ingles is supposed to be faxing over notes.

## 2015-07-07 NOTE — Telephone Encounter (Signed)
Renal function has been stable, despite recent increase in lisinopril. I will review the notes when I receive them and communicate with Dr. Alyson Ingles next week.  Dr. Lemmie Evens

## 2015-07-09 NOTE — Telephone Encounter (Signed)
New message      Calling to follow up on medication change.  Please call

## 2015-07-09 NOTE — Telephone Encounter (Signed)
Follow up      Patient's wife is calling back to talk to the nurse again

## 2015-07-09 NOTE — Telephone Encounter (Signed)
Returned call to patient's wife with Dr. Lysbeth Penner recommendations Explained the rationale that both an ACE-I and an ARB act on the kidneys to lower BP Wife was adamant that Dr. 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 She states she does not think her husband already has an account but she is driving now and will check on this when she gets home/create an account if needed. Advised that if she needs to call back to get our fax # etc, to please do so.

## 2015-07-09 NOTE — Telephone Encounter (Signed)
Patient's wife called Dr. Noland Fordyce office and they had not sent what this office said they would send.  Patient's wife said to call Dr. Noland Fordyce office if we have not received the records.

## 2015-07-09 NOTE — Telephone Encounter (Signed)
Both lisinopril and losartan can affect renal function, so making that switch does not make sense to me. It does seem that better BP control is in order. It would be best for him to come in and see me or Erasmo Downer for bp evaluation and management this week. We would need recent labwork to review, specifically renal function, as well.  Thanks.  Dr. Lemmie Evens

## 2015-07-09 NOTE — Telephone Encounter (Signed)
Wife notes BPs have been running 170/100 w pt on chemo. Very concerned, and urologist has been following kidney concerns. i asked if they wanted a nephrology referral, wife declined, feels Dr. Alyson Ingles is appropriately managing his issues. However, she wants Dr. [name] to change lisinopril to losartan per Dr. Noland Fordyce suggestion. She was anxious about having this changed. Aware I will send to Dr. [name].

## 2015-07-10 NOTE — Telephone Encounter (Signed)
LMTCB

## 2015-07-10 NOTE — Telephone Encounter (Signed)
Follow up ° ° ° ° ° °Returning a call to the nurse °

## 2015-07-10 NOTE — Telephone Encounter (Signed)
Called patient's wife to notify her that we had not yet received records from Dr. Noland Fordyce office. She states she will contact their office this AM.

## 2015-07-10 NOTE — Telephone Encounter (Signed)
Spoke with patient's wife. Explained that MD had reviewed notes from urologist and did not want to make med changes until patient is seen in office by either him or clinical pharmacist for BP check. Wife voiced understanding. Advised that patient bring in his BP cuff and list of BP readings to appt. Informed her a scheduler would contact them to arrange BP check appt. She states they are out of town til later next week.

## 2015-07-10 NOTE — Telephone Encounter (Signed)
She wants you to know they are faxing information to you.It will have your name on it,and it will come to the nurse station fax.

## 2015-07-10 NOTE — Telephone Encounter (Signed)
Patient scheduled for BP check appt with Erasmo Downer on 5/19

## 2015-07-10 NOTE — Telephone Encounter (Signed)
MD attempted outreach to patient/wife.  Per Dr. Noland Fordyce note, Dr. [name] would advise continuing current medications until he can see him in the office or until patient can see our clinical pharmacist for a BP check. Patient's CMP was normal and creatinine was 1.4

## 2015-07-10 NOTE — Telephone Encounter (Signed)
Faxed notes from urology received and given to MD

## 2015-07-20 ENCOUNTER — Other Ambulatory Visit (HOSPITAL_BASED_OUTPATIENT_CLINIC_OR_DEPARTMENT_OTHER): Payer: Medicare Other

## 2015-07-20 ENCOUNTER — Ambulatory Visit (HOSPITAL_BASED_OUTPATIENT_CLINIC_OR_DEPARTMENT_OTHER): Payer: Medicare Other | Admitting: Oncology

## 2015-07-20 ENCOUNTER — Other Ambulatory Visit: Payer: Self-pay | Admitting: Medical

## 2015-07-20 ENCOUNTER — Ambulatory Visit (INDEPENDENT_AMBULATORY_CARE_PROVIDER_SITE_OTHER): Payer: Medicare Other | Admitting: Pharmacist

## 2015-07-20 ENCOUNTER — Telehealth: Payer: Self-pay | Admitting: Oncology

## 2015-07-20 ENCOUNTER — Encounter: Payer: Self-pay | Admitting: Pharmacist Clinician (PhC)/ Clinical Pharmacy Specialist

## 2015-07-20 VITALS — BP 172/98 | HR 68 | Wt 212.4 lb

## 2015-07-20 VITALS — BP 183/100 | HR 67 | Temp 98.4°F | Resp 18 | Ht 68.0 in | Wt 212.8 lb

## 2015-07-20 DIAGNOSIS — C641 Malignant neoplasm of right kidney, except renal pelvis: Secondary | ICD-10-CM | POA: Diagnosis not present

## 2015-07-20 DIAGNOSIS — C786 Secondary malignant neoplasm of retroperitoneum and peritoneum: Secondary | ICD-10-CM

## 2015-07-20 DIAGNOSIS — C649 Malignant neoplasm of unspecified kidney, except renal pelvis: Secondary | ICD-10-CM

## 2015-07-20 DIAGNOSIS — I10 Essential (primary) hypertension: Secondary | ICD-10-CM

## 2015-07-20 DIAGNOSIS — C787 Secondary malignant neoplasm of liver and intrahepatic bile duct: Secondary | ICD-10-CM

## 2015-07-20 DIAGNOSIS — N289 Disorder of kidney and ureter, unspecified: Secondary | ICD-10-CM | POA: Diagnosis not present

## 2015-07-20 DIAGNOSIS — I255 Ischemic cardiomyopathy: Secondary | ICD-10-CM

## 2015-07-20 DIAGNOSIS — R918 Other nonspecific abnormal finding of lung field: Secondary | ICD-10-CM

## 2015-07-20 DIAGNOSIS — N2889 Other specified disorders of kidney and ureter: Secondary | ICD-10-CM

## 2015-07-20 LAB — CBC WITH DIFFERENTIAL/PLATELET
BASO%: 0.4 % (ref 0.0–2.0)
BASOS ABS: 0 10*3/uL (ref 0.0–0.1)
EOS ABS: 0.2 10*3/uL (ref 0.0–0.5)
EOS%: 3 % (ref 0.0–7.0)
HCT: 42.5 % (ref 38.4–49.9)
HEMOGLOBIN: 14 g/dL (ref 13.0–17.1)
LYMPH%: 17.9 % (ref 14.0–49.0)
MCH: 30.1 pg (ref 27.2–33.4)
MCHC: 32.8 g/dL (ref 32.0–36.0)
MCV: 91.8 fL (ref 79.3–98.0)
MONO#: 0.6 10*3/uL (ref 0.1–0.9)
MONO%: 10 % (ref 0.0–14.0)
NEUT#: 4 10*3/uL (ref 1.5–6.5)
NEUT%: 68.7 % (ref 39.0–75.0)
Platelets: 227 10*3/uL (ref 140–400)
RBC: 4.63 10*6/uL (ref 4.20–5.82)
RDW: 15.1 % — AB (ref 11.0–14.6)
WBC: 5.9 10*3/uL (ref 4.0–10.3)
lymph#: 1.1 10*3/uL (ref 0.9–3.3)

## 2015-07-20 LAB — COMPREHENSIVE METABOLIC PANEL
ALBUMIN: 3.6 g/dL (ref 3.5–5.0)
ALK PHOS: 78 U/L (ref 40–150)
ALT: 29 U/L (ref 0–55)
AST: 22 U/L (ref 5–34)
Anion Gap: 7 mEq/L (ref 3–11)
BUN: 24.2 mg/dL (ref 7.0–26.0)
CALCIUM: 9.8 mg/dL (ref 8.4–10.4)
CO2: 25 mEq/L (ref 22–29)
Chloride: 107 mEq/L (ref 98–109)
Creatinine: 1.3 mg/dL (ref 0.7–1.3)
EGFR: 56 mL/min/{1.73_m2} — AB (ref >90)
GLUCOSE: 107 mg/dL (ref 70–140)
POTASSIUM: 5 meq/L (ref 3.5–5.1)
SODIUM: 139 meq/L (ref 136–145)
Total Bilirubin: 0.4 mg/dL (ref 0.20–1.20)
Total Protein: 7.4 g/dL (ref 6.4–8.3)

## 2015-07-20 MED ORDER — LISINOPRIL 20 MG PO TABS: 20.0000 mg | ORAL_TABLET | Freq: Every day | ORAL | Status: DC

## 2015-07-20 MED ORDER — CARVEDILOL 6.25 MG PO TABS: 6.2500 mg | ORAL_TABLET | Freq: Two times a day (BID) | ORAL | Status: DC

## 2015-07-20 NOTE — Patient Instructions (Signed)
Return for a a follow up appointment in 3 weeks  Your blood pressure today is 1772/98   Check your blood pressure at home daily (if able) and keep record of the readings.  Take your BP meds as follows: Start taking lisinopril 20mg  once daily Increase Carvedilol take 2 tablets twice daily until you pick up the higher strength  Bring all of your meds, your BP cuff and your record of home blood pressures to your next appointment.  Exercise as you're able, try to walk approximately 30 minutes per day.  Keep salt intake to a minimum, especially watch canned and prepared boxed foods.  Eat more fresh fruits and vegetables and fewer canned items.  Avoid eating in fast food restaurants.    HOW TO TAKE YOUR BLOOD PRESSURE: . Rest 5 minutes before taking your blood pressure. .  Don't smoke or drink caffeinated beverages for at least 30 minutes before. . Take your blood pressure before (not after) you eat. . Sit comfortably with your back supported and both feet on the floor (don't cross your legs). . Elevate your arm to heart level on a table or a desk. . Use the proper sized cuff. It should fit smoothly and snugly around your bare upper arm. There should be enough room to slip a fingertip under the cuff. The bottom edge of the cuff should be 1 inch above the crease of the elbow. . Ideally, take 3 measurements at one sitting and record the average.

## 2015-07-20 NOTE — Progress Notes (Signed)
Patient ID: Derek [name]                 DOB: 09/05/1950                      MRN: [id number]     HPI: [name] is a 65 y.o. male referred by Dr. [name] to HTN clinic for evaluation. 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 He was recently diagnosed with metastatic renal cell carcinoma for which he underwent a nephrectomy and lymph node dissection. He was also placed on Votrient, a medication known to increase blood pressure. He states that his dose was recently cut back to 600mg  and he has seen an improvement in blood pressure. He also reports that the lisinopril dose was double quite awhile back and   His urologist had recommended that he come off his ACEi but had mentioned that he could be on an ARB (losartan specifically).   Current HTN meds:  Lisinopril 20mg  BID Carvedilol 3.125mg  BID Furosemide 20mg  PRN - has not been using  BP goal: <150/90  Family History: No significant cardiac history  Home BP readings: has 3 cuffs at home, he primarily uses the wrist cuff Range 139-184/93-111 with majority being >150/100 Reports HR usually runs in the high 60s  Wt Readings from Last 3 Encounters:  07/20/15 212 lb 6.4 oz (96.344 kg)  06/22/15 209 lb 12.8 oz (95.165 kg)  05/18/15 207 lb 11.2 oz (94.212 kg)   BP Readings from Last 3 Encounters:  07/20/15 172/98  06/22/15 177/93  05/18/15 163/86   Pulse Readings from Last 3 Encounters:  07/20/15 68  06/22/15 67  05/18/15 88    Renal function: CrCl cannot be calculated (Patient has no serum creatinine result on file.).  Past Medical History  Diagnosis Date  . Hypertension   . Gout   . NSTEMI (non-ST elevated myocardial infarction) ([place])   . Arthritis   . Shingles 7/15  . Renal cell carcinoma ([place]) 11/28/2014    Current Outpatient Prescriptions on File Prior to Visit  Medication Sig Dispense Refill  . aspirin EC 81 MG tablet Take 81 mg by mouth daily.    . Cholecalciferol (VITAMIN D-3) 5000 UNITS TABS Take 5,000  Units by mouth daily.     Marland Kitchen docusate sodium (COLACE) 100 MG capsule Take 1 capsule (100 mg total) by mouth 2 (two) times daily. 60 capsule 0  . KLOR-CON M20 20 MEQ tablet TAKE ONE TABLET BY MOUTH ONCE DAILY 30 tablet 6  . Multiple Vitamin (MULTIVITAMIN) capsule Take 1 capsule by mouth daily.    . pazopanib (VOTRIENT) 200 MG tablet Take 4 tablets (800 mg total) by mouth daily. Take on an empty stomach. 120 tablet 0  . Turmeric 500 MG CAPS Take 500 mg by mouth daily.     Marland Kitchen ULORIC 40 MG tablet TAKE ONE TABLET BY MOUTH ONCE DAILY 30 tablet 0  . colchicine 0.6 MG tablet Reported on 07/20/2015    . furosemide (LASIX) 20 MG tablet Take 1 tablet (20 mg total) by mouth daily as needed (for elevated blood pressure). (Patient not taking: Reported on 07/20/2015) 30 tablet 3   No current facility-administered medications on file prior to visit.    Allergies  Allergen Reactions  . Lipitor [Atorvastatin] Other (See Comments)    Myopathy...severe, with  Myoglobinuria, wheelchair bound for a few days  . Penicillins Anaphylaxis  . Prednisone Anxiety and Other (See Comments)    Extremely high  blood pressure after taking medication for first time.     Assessment/Plan: Hypertension: BP is not at goal and not well controlled. Per patient report it is slightly improved since decreasing dose of Votrient. Since the patient and his wife are extremely concerned about the lisinopril, we will cut back on lisinopril dose to 20mg  once daily and increase carvedilol to 6.25mg  twice daily. He will likely require additional interventions to control his blood pressure, such as titrating carvediolol dose or addition of another medication. Follow-up in 3 weeks.    Thank you, Lelan Pons. Patterson Hammersmith, [facility]  07/20/2015 8:42 AM

## 2015-07-20 NOTE — Progress Notes (Signed)
Hematology and Oncology Follow Up Visit  Derek [name] [id number] 07-06-1950 65 y.o. 07/20/2015 9:00 AM   Principle Diagnosis: 65 year old with renal cell carcinoma. He was diagnosed in August 2016 measuring 15.3 x 10.9 x 3.0 cm in the right kidney. He has retroperitoneal lymphadenopathy and a 2.7 cm liver mass indicating stage IV disease.    Prior Therapy: He is S/P right open radical nephrectomy and retroperitoneal lymph node dissection done on 11/28/2014. He also had partial right hepatectomy.  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 Metastatic lymph node involvement noted in the sampled lymph nodes. He also had involvement of renal cell carcinoma in the liver resection sample.  Current therapy: Votrient 800 mg daily started on 05/29/2015. He is currently taking 600 mg since 06/22/2015.  Interim History:  Derek [name] presents today for a follow-up visit. Since the last visit, he reports no recent changes in his health. He still have grade 2 fatigue associated with her treatment and have decreased his work days to 2 days a week. He denied any other GI comp locations including diarrhea, nausea or weight loss. His performance status and activity level remain at baseline. He continues to have issues associated with increase blood pressure and his antihypertensive medications have been adjusted by his cardiologist. He is ambulatory blood pressure monitoring continue to indicate systolic blood pressure between 170 180.    He does not report any headaches, blurry vision, syncope or seizures. He does not report any fevers, chills, sweats weight loss or appetite changes. He does not report any chest pain, palpitation, orthopnea. He does not report any cough, wheezing or dyspnea on exertion. He does not report any nausea, vomiting, abdominal pain, flank pain, or hematochezia. He does not report any frequency, urgency or hesitancy. He does not report  any skeletal complaints of arthralgias or myalgias. Does not report any lymphadenopathy or petechiae. Remaining review of systems unremarkable  Medications: I have reviewed the patient's current medications.  Current Outpatient Prescriptions  Medication Sig Dispense Refill  . aspirin EC 81 MG tablet Take 81 mg by mouth daily.    . carvedilol (COREG) 6.25 MG tablet Take 1 tablet (6.25 mg total) by mouth 2 (two) times daily. (Patient taking differently: Take 6.25 mg by mouth 2 (two) times daily. Patient takes 2 tablets twice daily) 180 tablet 3  . Cholecalciferol (VITAMIN D-3) 5000 UNITS TABS Take 5,000 Units by mouth daily.     . colchicine 0.6 MG tablet Reported on 07/20/2015    . docusate sodium (COLACE) 100 MG capsule Take 1 capsule (100 mg total) by mouth 2 (two) times daily. 60 capsule 0  . furosemide (LASIX) 20 MG tablet Take 1 tablet (20 mg total) by mouth daily as needed (for elevated blood pressure). 30 tablet 3  . KLOR-CON M20 20 MEQ tablet TAKE ONE TABLET BY MOUTH ONCE DAILY 30 tablet 6  . lisinopril (PRINIVIL,ZESTRIL) 20 MG tablet Take 1 tablet (20 mg total) by mouth daily. 180 tablet 3  . Multiple Vitamin (MULTIVITAMIN) capsule Take 1 capsule by mouth daily.    . pazopanib (VOTRIENT) 200 MG tablet Take 4 tablets (800 mg total) by mouth daily. Take on an empty stomach. (Patient taking differently: Take 600 mg by mouth daily. Take on an empty stomach. Dose reduced Taking 600 mg  ( 3 tablets daily )) 120 tablet 0  . Turmeric 500 MG CAPS Take 500 mg by mouth daily.     Marland Kitchen  ULORIC 40 MG tablet TAKE ONE TABLET BY MOUTH ONCE DAILY 30 tablet 0   No current facility-administered medications for this visit.     Allergies:  Allergies  Allergen Reactions  . Lipitor [Atorvastatin] Other (See Comments)    Myopathy...severe, with  Myoglobinuria, wheelchair bound for a few days  . Penicillins Anaphylaxis  . Prednisone Anxiety and Other (See Comments)    Extremely high blood pressure after  taking medication for first time.    Past Medical History, Surgical history, Social history, and Family History were reviewed and updated.   Blood pressure 183/100, pulse 67, temperature 98.4 F (36.9 C), temperature source Oral, resp. rate 18, height 5\' 8"  (1.727 m), weight 212 lb 12.8 oz (96.525 kg), SpO2 99 %. ECOG: 0 General appearance: Well-appearing gentleman without distress. Head: Normocephalic, without obvious abnormality no oral ulcers or lesions. Neck: no adenopathy Lymph nodes: Cervical, supraclavicular, and axillary nodes normal. Heart:regular rate and rhythm, S1, S2 normal, no murmur, click, rub or gallop Lung:chest clear, no wheezing, rales, normal symmetric air entry.  Abdomin: soft, non-tender, without masses or organomegaly. No rebound or guarding. EXT:no erythema, induration, or nodules   Lab Results: Lab Results  Component Value Date   WBC 5.9 07/20/2015   HGB 14.0 07/20/2015   HCT 42.5 07/20/2015   MCV 91.8 07/20/2015   PLT 227 07/20/2015     Chemistry      Component Value Date/Time   NA 140 06/22/2015 0833   NA 140 01/05/2015 0916   K 4.9 06/22/2015 0833   K 4.3 01/05/2015 0916   CL 105 01/05/2015 0916   CO2 24 06/22/2015 0833   CO2 30 01/05/2015 0916   BUN 21.4 06/22/2015 0833   BUN 20 01/05/2015 0916   CREATININE 1.4* 06/22/2015 0833   CREATININE 1.41 01/05/2015 0916   CREATININE 1.28* 12/11/2014 1614      Component Value Date/Time   CALCIUM 9.5 06/22/2015 0833   CALCIUM 9.7 01/05/2015 0916   ALKPHOS 71 06/22/2015 0833   ALKPHOS 89 01/05/2015 0916   AST 17 06/22/2015 0833   AST 19 01/05/2015 0916   ALT 15 06/22/2015 0833   ALT 26 01/05/2015 0916   BILITOT 0.48 06/22/2015 0833   BILITOT 0.5 01/05/2015 0916         Impression and Plan:  65 year old gentleman with the following issues:  1. 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 He is  status post surgical resection in September 2016 and he is fully healed at this time.  CT scan on 05/11/2015 showed relapsed disease with a mass in the right nephrectomy bed which is undoubtedly related to kidney cancer.  He is currently on Votrient with a reduced dose without any major side effects except hypertension. The plan is to repeat imaging studies in 4 weeks to assess response to this medication. She has an excellent response to therapy, this medication will be continued. Dose adjustment might be needed and further reduction of his dose to 400 mg is very possible that his blood pressure does not respond to antihypertensive medication adjustments. If his blood pressure continues to be an issue, switching to a different agent might be needed at that time.  2. Renal insufficiency: His creatinine is borderline after nephrectomy and needs to be monitored moving forward.   3. Hypertension: Reducing the Votrient dose might be needed down to 400 mg. He is currently having his antihypertensive medication adjusted by his  cardiologist.  4. Liver function prophylaxis: His LFTs will be monitored closely on Votrient.  5. Follow-up: Will be in one month to monitor for any complications.    [facility], MD 5/19/20179:00 AM

## 2015-07-20 NOTE — Telephone Encounter (Signed)
Gave and printed appt sched and avs for June....gv barium

## 2015-07-23 NOTE — Assessment & Plan Note (Signed)
BP is not at goal and not well controlled. Per patient report it is slightly improved since decreasing dose of Votrient. Since the patient and his wife are extremely concerned about the lisinopril, we will cut back on lisinopril dose to 20mg  once daily and increase carvedilol to 6.25mg  twice daily. He will likely require additional interventions to control his blood pressure, such as titrating carvediolol dose or addition of another medication. Follow-up in 3 weeks.

## 2015-07-25 ENCOUNTER — Other Ambulatory Visit: Payer: Self-pay | Admitting: Internal Medicine

## 2015-07-25 MED ORDER — CARVEDILOL 6.25 MG PO TABS: 6.2500 mg | ORAL_TABLET | Freq: Two times a day (BID) | ORAL | Status: DC

## 2015-07-25 NOTE — Telephone Encounter (Signed)
Coreg Rx(s) sent to pharmacy electronically.

## 2015-08-03 ENCOUNTER — Telehealth: Payer: Self-pay | Admitting: Pharmacist

## 2015-08-03 ENCOUNTER — Other Ambulatory Visit: Payer: Self-pay | Admitting: *Deleted

## 2015-08-03 DIAGNOSIS — C649 Malignant neoplasm of unspecified kidney, except renal pelvis: Secondary | ICD-10-CM

## 2015-08-03 MED ORDER — PAZOPANIB HCL 200 MG PO TABS: 600.0000 mg | ORAL_TABLET | Freq: Every day | ORAL | Status: DC

## 2015-08-03 MED FILL — *VOTRIENT 200 MG TABLET: 200 | 30 days supply | Qty: 90 | Fill #0

## 2015-08-03 NOTE — Telephone Encounter (Signed)
Pt's wife, Jolayne Haines called [facility] requesting refill on Votrient.  They use [facility].  I have relayed this message by phone to [facility], RN.  Orig Votrient start date: 05/29/15  Jolayne Haines states her husband is doing ~okay.  He is now working less hours and by the end of 2 days in a row of work, he is very tired.  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 He has also c/o joint pain.  On a good note, pts BP is more stable.  Running 150's/90's "much better!"  He is on Lisinopril 20 mg daily, Lasix 20 mg daily PRN and Coreg 6.25 mg BID.  He has appt on 6/16 w/ cardiology. We plan to f/u w/ pt again after this appt to see if more changes were necessary to anti-hypertension meds.  If doing ok, we'll likely discontinue f/u phone calls to pt.  He is sched to see Dr. Alen Blew again on 08/24/15.   Kennith Center, Pharm.D., CPP 08/03/2015@9 :40 AM [facility]

## 2015-08-15 ENCOUNTER — Telehealth: Payer: Self-pay | Admitting: Oncology

## 2015-08-15 ENCOUNTER — Other Ambulatory Visit: Payer: Self-pay | Admitting: Medical

## 2015-08-15 NOTE — Telephone Encounter (Signed)
S.w. Pt and r/s appt...done....ok and aware of new d.t

## 2015-08-17 ENCOUNTER — Ambulatory Visit (INDEPENDENT_AMBULATORY_CARE_PROVIDER_SITE_OTHER): Payer: Medicare Other | Admitting: Pharmacist

## 2015-08-17 VITALS — BP 154/96 | HR 66 | Wt 211.0 lb

## 2015-08-17 DIAGNOSIS — I255 Ischemic cardiomyopathy: Secondary | ICD-10-CM | POA: Diagnosis not present

## 2015-08-17 DIAGNOSIS — I10 Essential (primary) hypertension: Secondary | ICD-10-CM | POA: Diagnosis not present

## 2015-08-17 MED ORDER — CARVEDILOL 12.5 MG PO TABS: 12.5000 mg | ORAL_TABLET | Freq: Two times a day (BID) | ORAL | Status: DC

## 2015-08-17 NOTE — Progress Notes (Signed)
Patient ID: Derek [name]                 DOB: [birth year]                      MRN: [id number]     HPI: Derek [name] is a 65 y.o. male patient of Derek [name] here for follow-up. 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 He was recently diagnosed with metastatic renal cell carcinoma for which he underwent a nephrectomy and lymph node dissection. He was also placed on Votrient, a medication known to increase blood pressure. He states that his dose was recently cut back to 600mg  and he has seen an improvement in blood pressure. His urologist had recommended that he come off his ACEi but had mentioned that he could be on an ARB (losartan specifically). There was recently a bump in his creatinine after his nephrectomy and his lisinopril dose was doubled to 20mg  BID.   At our last visit we reduced his lisinopril to 20mg  once daily and increased his carvedilol to 6.25mg  BID.   He appears in much better spirits today. He was open with me that he believes a significant portion of his blood pressure spikes come from the stress of work. He and his wife say that he has been feeling better since our last visit despite the stress of work.   He and his wife also have questions about his chemo agent. He asks about minimum effective dose and blood pressure with this dose.   Current HTN meds:  Carvedilol 6.25mg  BID Furosemide 20mg  PRN high blood pressure (he has been using more for swelling than high blood pressure and has not needed any in the last month) Lisinopril 20mg  in the morning  Previously tried: Lisinopril 20mg  BID  BP goal: <150/90  Home BP readings: range 137-184/85-107. 9/17 readings >150/90. The majority of the readings in goal were 130s/hi80s.   Wt Readings from Last 3 Encounters:  07/20/15 212 lb 12.8 oz (96.525 kg)  07/20/15 212 lb 6.4 oz (96.344 kg)  06/22/15 209 lb 12.8 oz (95.165 kg)   BP Readings from Last 3 Encounters:  07/20/15 183/100  07/20/15 172/98  06/22/15 177/93    Pulse Readings from Last 3 Encounters:  07/20/15 67  07/20/15 68  06/22/15 67    Renal function: CrCl cannot be calculated (Unknown ideal weight.).  Past Medical History  Diagnosis Date  . Hypertension   . Gout   . NSTEMI (non-ST elevated myocardial infarction) ([place])   . Arthritis   . Shingles 7/15  . Renal cell carcinoma ([place]) 11/28/2014    Current Outpatient Prescriptions on File Prior to Visit  Medication Sig Dispense Refill  . aspirin EC 81 MG tablet Take 81 mg by mouth daily.    . carvedilol (COREG) 6.25 MG tablet Take 1 tablet (6.25 mg total) by mouth 2 (two) times daily. 60 tablet 6  . Cholecalciferol (VITAMIN D-3) 5000 UNITS TABS Take 5,000 Units by mouth daily.     . colchicine 0.6 MG tablet Reported on 07/20/2015    . docusate sodium (COLACE) 100 MG capsule Take 1 capsule (100 mg total) by mouth 2 (two) times daily. 60 capsule 0  . furosemide (LASIX) 20 MG tablet Take 1 tablet (20 mg total) by mouth daily as needed (for elevated blood pressure). 30 tablet 3  . KLOR-CON M20 20 MEQ tablet TAKE ONE TABLET BY MOUTH ONCE DAILY 30 tablet 6  . lisinopril (PRINIVIL,ZESTRIL)  20 MG tablet Take 1 tablet (20 mg total) by mouth daily. 180 tablet 3  . Multiple Vitamin (MULTIVITAMIN) capsule Take 1 capsule by mouth daily.    . pazopanib (VOTRIENT) 200 MG tablet Take 3 tablets (600 mg total) by mouth daily. Take on an empty stomach. 90 tablet 0  . Turmeric 500 MG CAPS Take 500 mg by mouth daily.     Marland Kitchen ULORIC 40 MG tablet TAKE ONE TABLET BY MOUTH ONCE DAILY 30 tablet 0   No current facility-administered medications on file prior to visit.    Allergies  Allergen Reactions  . Lipitor [Atorvastatin] Other (See Comments)    Myopathy...severe, with  Myoglobinuria, wheelchair bound for a few days  . Penicillins Anaphylaxis  . Prednisone Anxiety and Other (See Comments)    Extremely high blood pressure after taking medication for first time.     Assessment/Plan: Hypertension: BP  is not at goal but slightly improved after increased dose of carvedilol. HR remains stable. Will increase carvedilol to 12.5mg  BID. May consider change from lisinopril to ARB at next visit for added BP control. Scr appears stable now. Follow up in hypertension clinic in 2 months after visit with Derek [name].   Thank you, Lelan Pons. Patterson Hammersmith, [place]

## 2015-08-17 NOTE — Patient Instructions (Addendum)
Return for a a follow up appointment in 1 month with Dr. [name] then 1 month after follow-up with blood pressure  Your blood pressure today is  154/96  Check your blood pressure at home daily (if able) and keep record of the readings.  Take your BP meds as follows: Increase carvedilol 12.5 mg twice a day  Bring all of your meds, your BP cuff and your record of home blood pressures to your next appointment.  Exercise as you're able, try to walk approximately 30 minutes per day.  Keep salt intake to a minimum, especially watch canned and prepared boxed foods.  Eat more fresh fruits and vegetables and fewer canned items.  Avoid eating in fast food restaurants.    HOW TO TAKE YOUR BLOOD PRESSURE: . Rest 5 minutes before taking your blood pressure. .  Don't smoke or drink caffeinated beverages for at least 30 minutes before. . Take your blood pressure before (not after) you eat. . Sit comfortably with your back supported and both feet on the floor (don't cross your legs). . Elevate your arm to heart level on a table or a desk. . Use the proper sized cuff. It should fit smoothly and snugly around your bare upper arm. There should be enough room to slip a fingertip under the cuff. The bottom edge of the cuff should be 1 inch above the crease of the elbow. . Ideally, take 3 measurements at one sitting and record the average.

## 2015-08-19 ENCOUNTER — Encounter: Payer: Self-pay | Admitting: Pharmacist

## 2015-08-19 NOTE — Assessment & Plan Note (Signed)
BP is not at goal but slightly improved after increased dose of carvedilol. HR remains stable. Will increase carvedilol to 12.5mg  BID. May consider change from lisinopril to ARB at next visit for added BP control. Scr appears stable now. Follow up in hypertension clinic in 2 months after visit with Dr. [name].

## 2015-08-21 ENCOUNTER — Other Ambulatory Visit (HOSPITAL_BASED_OUTPATIENT_CLINIC_OR_DEPARTMENT_OTHER): Payer: Medicare Other

## 2015-08-21 ENCOUNTER — Other Ambulatory Visit: Payer: Medicare Other

## 2015-08-21 ENCOUNTER — Ambulatory Visit (HOSPITAL_COMMUNITY)
Admission: RE | Admit: 2015-08-21 | Discharge: 2015-08-21 | Disposition: A | Discharge status: Home / Self Care | Payer: Medicare Other | Source: Ambulatory Visit | Attending: Oncology | Admitting: Oncology

## 2015-08-21 DIAGNOSIS — R918 Other nonspecific abnormal finding of lung field: Secondary | ICD-10-CM | POA: Diagnosis not present

## 2015-08-21 DIAGNOSIS — C641 Malignant neoplasm of right kidney, except renal pelvis: Secondary | ICD-10-CM | POA: Insufficient documentation

## 2015-08-21 DIAGNOSIS — I700 Atherosclerosis of aorta: Secondary | ICD-10-CM | POA: Diagnosis not present

## 2015-08-21 DIAGNOSIS — K6389 Other specified diseases of intestine: Secondary | ICD-10-CM | POA: Diagnosis not present

## 2015-08-21 DIAGNOSIS — N2889 Other specified disorders of kidney and ureter: Secondary | ICD-10-CM | POA: Diagnosis present

## 2015-08-21 DIAGNOSIS — Z905 Acquired absence of kidney: Secondary | ICD-10-CM | POA: Insufficient documentation

## 2015-08-21 LAB — CBC WITH DIFFERENTIAL/PLATELET
BASO%: 0.5 % (ref 0.0–2.0)
BASOS ABS: 0 10*3/uL (ref 0.0–0.1)
EOS%: 2.8 % (ref 0.0–7.0)
Eosinophils Absolute: 0.1 10*3/uL (ref 0.0–0.5)
HEMATOCRIT: 40.9 % (ref 38.4–49.9)
HGB: 13.6 g/dL (ref 13.0–17.1)
LYMPH#: 1.1 10*3/uL (ref 0.9–3.3)
LYMPH%: 20.5 % (ref 14.0–49.0)
MCH: 30 pg (ref 27.2–33.4)
MCHC: 33.2 g/dL (ref 32.0–36.0)
MCV: 90.3 fL (ref 79.3–98.0)
MONO#: 0.6 10*3/uL (ref 0.1–0.9)
MONO%: 12 % (ref 0.0–14.0)
NEUT#: 3.5 10*3/uL (ref 1.5–6.5)
NEUT%: 64.2 % (ref 39.0–75.0)
Platelets: 302 10*3/uL (ref 140–400)
RBC: 4.53 10*6/uL (ref 4.20–5.82)
RDW: 15.3 % — ABNORMAL HIGH (ref 11.0–14.6)
WBC: 5.4 10*3/uL (ref 4.0–10.3)

## 2015-08-21 LAB — COMPREHENSIVE METABOLIC PANEL
ALT: 22 U/L (ref 0–55)
AST: 16 U/L (ref 5–34)
Albumin: 3.3 g/dL — ABNORMAL LOW (ref 3.5–5.0)
Alkaline Phosphatase: 87 U/L (ref 40–150)
Anion Gap: 8 mEq/L (ref 3–11)
BUN: 23.5 mg/dL (ref 7.0–26.0)
CALCIUM: 9.6 mg/dL (ref 8.4–10.4)
CHLORIDE: 103 meq/L (ref 98–109)
CO2: 27 mEq/L (ref 22–29)
Creatinine: 1.4 mg/dL — ABNORMAL HIGH (ref 0.7–1.3)
EGFR: 51 mL/min/{1.73_m2} — AB (ref >90)
Glucose: 106 mg/dl (ref 70–140)
POTASSIUM: 5.3 meq/L — AB (ref 3.5–5.1)
Sodium: 138 mEq/L (ref 136–145)
Total Bilirubin: 0.47 mg/dL (ref 0.20–1.20)
Total Protein: 7.5 g/dL (ref 6.4–8.3)

## 2015-08-24 ENCOUNTER — Ambulatory Visit: Payer: Medicare Other | Admitting: Oncology

## 2015-08-30 ENCOUNTER — Encounter: Payer: Self-pay | Admitting: *Deleted

## 2015-08-30 NOTE — Progress Notes (Signed)
This RN called and spoke to patient's wife informing her that the letter for their cruise was ready for pickup. Letter will be out in the lobby with Ms. Derek [name]. She verbalized understanding.

## 2015-09-05 ENCOUNTER — Encounter: Payer: Self-pay | Admitting: Oncology

## 2015-09-05 MED FILL — *VOTRIENT 200 MG TABLET: 200 | 30 days supply | Qty: 90 | Fill #0

## 2015-09-05 NOTE — Progress Notes (Signed)
I sent staff mess to dixie/dr. Alen Blew, insurance denied votrient and to see if there is an alternative for him

## 2015-09-10 ENCOUNTER — Emergency Department (HOSPITAL_BASED_OUTPATIENT_CLINIC_OR_DEPARTMENT_OTHER)
Admission: EM | Admit: 2015-09-10 | Discharge: 2015-09-10 | Disposition: A | Discharge status: Home / Self Care | Payer: Medicare Other | Attending: Emergency Medicine | Admitting: Emergency Medicine

## 2015-09-10 ENCOUNTER — Emergency Department (HOSPITAL_BASED_OUTPATIENT_CLINIC_OR_DEPARTMENT_OTHER): Payer: Medicare Other

## 2015-09-10 ENCOUNTER — Encounter (HOSPITAL_BASED_OUTPATIENT_CLINIC_OR_DEPARTMENT_OTHER): Payer: Self-pay

## 2015-09-10 DIAGNOSIS — I10 Essential (primary) hypertension: Secondary | ICD-10-CM | POA: Diagnosis not present

## 2015-09-10 DIAGNOSIS — M549 Dorsalgia, unspecified: Secondary | ICD-10-CM

## 2015-09-10 DIAGNOSIS — K8020 Calculus of gallbladder without cholecystitis without obstruction: Secondary | ICD-10-CM | POA: Insufficient documentation

## 2015-09-10 DIAGNOSIS — Z85528 Personal history of other malignant neoplasm of kidney: Secondary | ICD-10-CM | POA: Diagnosis not present

## 2015-09-10 DIAGNOSIS — M8468XA Pathological fracture in other disease, other site, initial encounter for fracture: Secondary | ICD-10-CM | POA: Diagnosis not present

## 2015-09-10 DIAGNOSIS — Z7982 Long term (current) use of aspirin: Secondary | ICD-10-CM | POA: Diagnosis not present

## 2015-09-10 DIAGNOSIS — M545 Low back pain: Secondary | ICD-10-CM | POA: Diagnosis present

## 2015-09-10 DIAGNOSIS — M8448XA Pathological fracture, other site, initial encounter for fracture: Secondary | ICD-10-CM | POA: Diagnosis not present

## 2015-09-10 LAB — COMPREHENSIVE METABOLIC PANEL
ALBUMIN: 3.7 g/dL (ref 3.5–5.0)
ALK PHOS: 73 U/L (ref 38–126)
ALT: 20 U/L (ref 17–63)
ANION GAP: 8 (ref 5–15)
AST: 17 U/L (ref 15–41)
BUN: 27 mg/dL — ABNORMAL HIGH (ref 6–20)
CALCIUM: 9.5 mg/dL (ref 8.9–10.3)
CHLORIDE: 104 mmol/L (ref 101–111)
CO2: 27 mmol/L (ref 22–32)
CREATININE: 1.4 mg/dL — AB (ref 0.61–1.24)
GFR calc non Af Amer: 50 mL/min — ABNORMAL LOW (ref >60)
GFR, EST AFRICAN AMERICAN: 59 mL/min — AB (ref >60)
GLUCOSE: 126 mg/dL — AB (ref 65–99)
Potassium: 4.7 mmol/L (ref 3.5–5.1)
SODIUM: 139 mmol/L (ref 135–145)
Total Bilirubin: 0.5 mg/dL (ref 0.3–1.2)
Total Protein: 7.7 g/dL (ref 6.5–8.1)

## 2015-09-10 LAB — URINE MICROSCOPIC-ADD ON: RBC / HPF: NONE SEEN RBC/hpf (ref 0–5)

## 2015-09-10 LAB — URINALYSIS, ROUTINE W REFLEX MICROSCOPIC
Bilirubin Urine: NEGATIVE
GLUCOSE, UA: NEGATIVE mg/dL
Hgb urine dipstick: NEGATIVE
KETONES UR: NEGATIVE mg/dL
LEUKOCYTES UA: NEGATIVE
NITRITE: NEGATIVE
PH: 5.5 (ref 5.0–8.0)
Protein, ur: 30 mg/dL — AB
SPECIFIC GRAVITY, URINE: 1.028 (ref 1.005–1.030)

## 2015-09-10 LAB — CBC WITH DIFFERENTIAL/PLATELET
BASOS PCT: 0 %
Basophils Absolute: 0 10*3/uL (ref 0.0–0.1)
EOS ABS: 0.1 10*3/uL (ref 0.0–0.7)
EOS PCT: 3 %
HCT: 38.9 % — ABNORMAL LOW (ref 39.0–52.0)
HEMOGLOBIN: 12.8 g/dL — AB (ref 13.0–17.0)
LYMPHS ABS: 0.7 10*3/uL (ref 0.7–4.0)
Lymphocytes Relative: 15 %
MCH: 30.6 pg (ref 26.0–34.0)
MCHC: 32.9 g/dL (ref 30.0–36.0)
MCV: 93.1 fL (ref 78.0–100.0)
MONO ABS: 0.6 10*3/uL (ref 0.1–1.0)
MONOS PCT: 12 %
NEUTROS PCT: 70 %
Neutro Abs: 3.5 10*3/uL (ref 1.7–7.7)
Platelets: 267 10*3/uL (ref 150–400)
RBC: 4.18 MIL/uL — ABNORMAL LOW (ref 4.22–5.81)
RDW: 14.9 % (ref 11.5–15.5)
WBC: 4.9 10*3/uL (ref 4.0–10.5)

## 2015-09-10 MED ORDER — TRAMADOL HCL 50 MG PO TABS: 50.0000 mg | ORAL_TABLET | Freq: Four times a day (QID) | ORAL | Status: DC | PRN

## 2015-09-10 MED ORDER — TRAMADOL HCL 50 MG PO TABS
50.0000 mg | ORAL_TABLET | Freq: Once | ORAL | Status: AC
  Administered 2015-09-10: 50 mg via ORAL
  Filled 2015-09-10: qty 1

## 2015-09-10 NOTE — ED Notes (Signed)
C/o lower back pain x 1 week-denise injury-denies hx of back pain-walking with slow steady gait

## 2015-09-10 NOTE — ED Provider Notes (Signed)
CSN: [id number]     Arrival date & time 09/10/15  1558 History  By signing my name below, I, Derek [name], attest that this documentation has been prepared under the direction and in the presence of Derek Rice, MD. Electronically Signed: Georgette [name], ED Scribe. 09/10/2015. 4:33 PM.   Chief Complaint  Patient presents with  . Back Pain   The history is provided by the patient and the spouse. No language interpreter was used.    HPI Comments: Derek [name] is a 65 y.o. male with h/o HTN, renal cell carcinoma, and arthritis who presents to the Emergency Department complaining of new onset, intermittent, bilateral lower back pain radiating to the right lower extremity onset one week ago. Pt notes he has done some heavy lifting a couple days prior to the onset of the pain. Per pt's wife, they have been in the beach all week but came back one day early because of his worsening pain. Pt is able to ambulate slowly. Pain is exacerbated when pt stands up and standing for long periods.  Pt takes one Tylenol extra strength in the morning and at night and notes that the back pain resolves. Pt has no h/o back pain. Pt reports that he had a CT scan done a month ago and notes there was no abnormal findings. Has history of renal cell carcinoma and right nephrectomy. Pt denies any injury or fall. Pt has no other complaints at this time. Pt denies nausea, vomiting, fever, urinary frequency, hematuria, generalized weakness, abdominal pain, and chills.   Past Medical History  Diagnosis Date  . Hypertension   . Gout   . NSTEMI (non-ST elevated myocardial infarction) ([place])   . Arthritis   . Shingles 7/15  . Renal cell carcinoma ([place]) 11/28/2014   Past Surgical History  Procedure Laterality Date  . Knee surgery    . Coronary artery bypass graft N/A 01/27/2014    Procedure: CORONARY ARTERY BYPASS GRAFTING (CABG), ON PUMP, TIMES FOUR, USING LEFT INTERNAL MAMMARY ARTERY, RIGHT GREATER SAPHENOUS VEIN HARVESTED  ENDOSCOPICALLY;  Surgeon: Derek Pollack, MD;  Location: [place];  Service: Open Heart Surgery;  Laterality: N/A;  LIMA-LAD; SVG-OM; SVG-PD; SVG-DIAG  . Tee without cardioversion N/A 01/27/2014    Procedure: TRANSESOPHAGEAL ECHOCARDIOGRAM (TEE);  Surgeon: Derek Pollack, MD;  Location: [place];  Service: Open Heart Surgery;  Laterality: N/A;  . Left heart catheterization with coronary angiogram N/A 01/23/2014    Procedure: LEFT HEART CATHETERIZATION WITH CORONARY ANGIOGRAM;  Surgeon: Derek Ohara, MD;  Location[facility] CATH LAB;  Service: Cardiovascular;  Laterality: N/A;  . Open partial hepatectomy [83] N/A 11/28/2014    Procedure: OPEN PARTIAL HEPATECTOMY;  Surgeon: Derek Klein, MD;  Location: [place];  Service: General;  Laterality: N/A;  . Nephrectomy Right 11/28/2014    Procedure: RIGHT OPEN NEPHRECTOMY;  Surgeon: Derek Gustin, MD;  Location: [place];  Service: Urology;  Laterality: Right;   Family History  Problem Relation Age of Onset  . Cancer Mother   . Cancer Father    Social History  Substance Use Topics  . Smoking status: Never Smoker   . Smokeless tobacco: Never Used  . Alcohol Use: Yes     Comment: 1-2 drinks/week    Review of Systems  Constitutional: Negative for fever, chills and fatigue.  Respiratory: Negative for cough and shortness of breath.   Cardiovascular: Negative for chest pain.  Gastrointestinal: Negative for nausea, vomiting, abdominal pain and diarrhea.  Genitourinary: Negative for dysuria,  frequency, hematuria and flank pain.  Musculoskeletal: Positive for back pain. Negative for myalgias, neck pain and neck stiffness.  Skin: Negative for rash and wound.  Neurological: Negative for dizziness, tremors, syncope, weakness, light-headedness and numbness.  All other systems reviewed and are negative.     Allergies  Lipitor; Penicillins; and Prednisone  Home Medications   Prior to Admission medications   Medication Sig Start Date End Date Taking?  Authorizing Provider  aspirin EC 81 MG tablet Take 81 mg by mouth daily.    Historical Provider, MD  carvedilol (COREG) 12.5 MG tablet Take 1 tablet (12.5 mg total) by mouth 2 (two) times daily. 08/17/15   Derek Casino, MD  Cholecalciferol (VITAMIN D-3) 5000 UNITS TABS Take 5,000 Units by mouth daily.     Historical Provider, MD  colchicine 0.6 MG tablet Reported on 08/17/2015    Historical Provider, MD  docusate sodium (COLACE) 100 MG capsule Take 1 capsule (100 mg total) by mouth 2 (two) times daily. 12/01/14   Derek Gustin, MD  furosemide (LASIX) 20 MG tablet Take 1 tablet (20 mg total) by mouth daily as needed (for elevated blood pressure). 03/30/15   Derek Casino, MD  KLOR-CON M20 20 MEQ tablet TAKE ONE TABLET BY MOUTH ONCE DAILY 03/09/15   Derek Casino, MD  lisinopril (PRINIVIL,ZESTRIL) 20 MG tablet Take 1 tablet (20 mg total) by mouth daily. 07/20/15   Derek Casino, MD  Multiple Vitamin (MULTIVITAMIN) capsule Take 1 capsule by mouth daily.    Historical Provider, MD  omega-3 acid ethyl esters (LOVAZA) 1 g capsule Take 1 g by mouth daily.    Historical Provider, MD  pazopanib (VOTRIENT) 200 MG tablet Take 3 tablets (600 mg total) by mouth daily. Take on an empty stomach. 08/03/15   Derek Portela, MD  traMADol (ULTRAM) 50 MG tablet Take 1 tablet (50 mg total) by mouth every 6 (six) hours as needed. 09/10/15   Derek Rice, MD  Turmeric 500 MG CAPS Take 500 mg by mouth daily.     Historical Provider, MD  ULORIC 40 MG tablet TAKE ONE TABLET BY MOUTH ONCE DAILY 08/15/15   Derek Filler Copland, MD   BP 153/87 mmHg  Pulse 66  Temp(Src) 98.7 F (37.1 C) (Oral)  Resp 16  Ht 5\' 8"  (1.727 m)  Wt 200 lb (90.719 kg)  BMI 30.42 kg/m2  SpO2 98% Physical Exam  Constitutional: He is oriented to person, place, and time. He appears well-developed and well-nourished. No distress.  HENT:  Head: Normocephalic and atraumatic.  Mouth/Throat: Oropharynx is clear and moist.  Eyes: EOM are normal.  Pupils are equal, round, and reactive to light.  Neck: Normal range of motion. Neck supple.  Cardiovascular: Normal rate and regular rhythm.   Pulmonary/Chest: Effort normal and breath sounds normal. No respiratory distress. He has no wheezes. He has no rales. He exhibits no tenderness.  Abdominal: Soft. Bowel sounds are normal. He exhibits no distension and no mass. There is no tenderness. There is no rebound and no guarding.  Musculoskeletal: Normal range of motion. He exhibits no edema or tenderness.  No midline thoracic or lumbar tenderness. No bilateral CVA tenderness. Negative straight leg bilaterally. 2+ distal pulses. No lower extremity asymmetry or tenderness.  Neurological: He is alert and oriented to person, place, and time.  Moves all extremities without deficit. Sensation is fully intact, specifically no saddle anesthesia. Patient ambulates without assistance.  Skin: Skin is warm and dry. No rash  noted. No erythema.  Psychiatric: He has a normal mood and affect. His behavior is normal.  Nursing note and vitals reviewed.   ED Course  Procedures  DIAGNOSTIC STUDIES: Oxygen Saturation is 100% on RA, normal by my interpretation.    COORDINATION OF CARE: 4:24 PM Discussed treatment plan with pt at bedside which includes CT scan and pt agreed to plan.  Labs Review Labs Reviewed  CBC WITH DIFFERENTIAL/PLATELET - Abnormal; Notable for the following:    RBC 4.18 (*)    Hemoglobin 12.8 (*)    HCT 38.9 (*)    All other components within normal limits  COMPREHENSIVE METABOLIC PANEL - Abnormal; Notable for the following:    Glucose, Bld 126 (*)    BUN 27 (*)    Creatinine, Ser 1.40 (*)    GFR calc non Af Amer 50 (*)    GFR calc Af Amer 59 (*)    All other components within normal limits  URINALYSIS, ROUTINE W REFLEX MICROSCOPIC (NOT AT [facility]) - Abnormal; Notable for the following:    Protein, ur 30 (*)    All other components within normal limits  URINE MICROSCOPIC-ADD ON -  Abnormal; Notable for the following:    Squamous Epithelial / LPF 0-5 (*)    Bacteria, UA MANY (*)    Casts HYALINE CASTS (*)    All other components within normal limits    Imaging Review Ct Abdomen Pelvis Wo Contrast  09/10/2015  CLINICAL DATA:  Severe low back pain for 1 week. History of right nephrectomy for renal carcinoma. EXAM: CT ABDOMEN AND PELVIS WITHOUT CONTRAST TECHNIQUE: Multidetector CT imaging of the abdomen and pelvis was performed following the standard protocol without IV contrast. COMPARISON:  CT chest, abdomen and pelvis 08/21/2015 and 05/11/2015. FINDINGS: There is mild cardiomegaly. Calcific coronary atherosclerosis is identified. No pleural or pericardial effusion. The lung bases are clear. Two small low attenuating lesions in the dome of the liver on image 15 are unchanged. The larger measures 1.3 cm in diameter. Postoperative change along the right hepatic lobe is again identified. The appearance of the liver is unchanged. The patient is status post right nephrectomy. Abnormal soft tissue attenuation in the nephrectomy bed in the right side of the L1 vertebral body is identified. Involvement in L1 was present on the prior study but more conspicuous today. Discrete measurement is difficult given lack of oral or IV contrast but the soft tissue mass measures approximately 5.7 cm transverse by 7.9 cm AP on image 36 compared to 4.7 cm transverse by 6.4 cm AP on the most recent examination. The spleen, adrenal glands and pancreas appear normal. Left renal cyst is unchanged. Aortoiliac atherosclerosis without aneurysm is identified. Prostate gland is mildly prominent. Urinary bladder and seminal vesicles are unremarkable. A few colonic diverticula are seen but there is no evidence of diverticulitis. As noted above, the mass in the nephrectomy bed involves the proximal transverse colon as seen on the prior exam. Bones demonstrate progressive destructive change in the right side of L1 from  recurrent tumor as described above. Small sclerotic lesion in the L4 vertebral body is unchanged and likely benign. Lower lumbar facet arthropathy is noted. IMPRESSION: Enlargement in the size of a mass in the right nephrectomy bed with progressive invasion of the right side of the L1 vertebral body and invasion of the proximal transverse colon and right psoas. The appearance of the abdomen and pelvis is otherwise unchanged since the most recent examination. Gallstones without cholecystitis. Atherosclerosis.  Electronically Signed   By: Inge Rise M.D.   On: 09/10/2015 17:10   I have personally reviewed and evaluated these images and lab results as part of my medical decision-making.   MDM   Final diagnoses:  Acute back pain  Pathologic fracture of lumbar vertebra, initial encounter    I personally performed the services described in this documentation, which was scribed in my presence. The recorded information has been reviewed and is accurate.   Discussed with oncology, Dr Irene Limbo. States if the patient's symptoms are controlled can follow-up in the oncology office. He should call make appointment tomorrow morning.  Patient's states his pain is improved. He continues to have a normal neurologic exam. Understands plan and agrees. Return precautions given.  Derek Rice, MD 09/10/15 [phone]

## 2015-09-11 ENCOUNTER — Other Ambulatory Visit: Payer: Self-pay | Admitting: Oncology

## 2015-09-11 DIAGNOSIS — C649 Malignant neoplasm of unspecified kidney, except renal pelvis: Secondary | ICD-10-CM

## 2015-09-12 ENCOUNTER — Other Ambulatory Visit: Payer: Self-pay | Admitting: Oncology

## 2015-09-12 DIAGNOSIS — C649 Malignant neoplasm of unspecified kidney, except renal pelvis: Secondary | ICD-10-CM

## 2015-09-13 ENCOUNTER — Other Ambulatory Visit: Payer: Self-pay | Admitting: Family Medicine

## 2015-09-14 ENCOUNTER — Other Ambulatory Visit: Payer: Self-pay | Admitting: Oncology

## 2015-09-14 ENCOUNTER — Telehealth: Payer: Self-pay | Admitting: Oncology

## 2015-09-14 ENCOUNTER — Ambulatory Visit (HOSPITAL_COMMUNITY)
Admission: RE | Admit: 2015-09-14 | Discharge: 2015-09-14 | Disposition: A | Discharge status: Home / Self Care | Payer: Medicare Other | Source: Ambulatory Visit | Attending: Oncology | Admitting: Oncology

## 2015-09-14 DIAGNOSIS — C641 Malignant neoplasm of right kidney, except renal pelvis: Secondary | ICD-10-CM | POA: Diagnosis not present

## 2015-09-14 DIAGNOSIS — I788 Other diseases of capillaries: Secondary | ICD-10-CM | POA: Diagnosis not present

## 2015-09-14 DIAGNOSIS — M5137 Other intervertebral disc degeneration, lumbosacral region: Secondary | ICD-10-CM | POA: Diagnosis not present

## 2015-09-14 DIAGNOSIS — M4806 Spinal stenosis, lumbar region: Secondary | ICD-10-CM | POA: Insufficient documentation

## 2015-09-14 DIAGNOSIS — S32010A Wedge compression fracture of first lumbar vertebra, initial encounter for closed fracture: Secondary | ICD-10-CM | POA: Diagnosis not present

## 2015-09-14 DIAGNOSIS — Z905 Acquired absence of kidney: Secondary | ICD-10-CM | POA: Diagnosis not present

## 2015-09-14 DIAGNOSIS — C649 Malignant neoplasm of unspecified kidney, except renal pelvis: Secondary | ICD-10-CM

## 2015-09-14 DIAGNOSIS — M4853XA Collapsed vertebra, not elsewhere classified, cervicothoracic region, initial encounter for fracture: Secondary | ICD-10-CM | POA: Insufficient documentation

## 2015-09-14 DIAGNOSIS — K8020 Calculus of gallbladder without cholecystitis without obstruction: Secondary | ICD-10-CM | POA: Diagnosis not present

## 2015-09-14 MED ORDER — GADOBENATE DIMEGLUMINE 529 MG/ML IV SOLN
20.0000 mL | Freq: Once | INTRAVENOUS | Status: AC | PRN
  Administered 2015-09-14: 19 mL via INTRAVENOUS

## 2015-09-14 MED ORDER — HYDROCODONE-ACETAMINOPHEN 5-325 MG PO TABS: 1.0000 | ORAL_TABLET | Freq: Four times a day (QID) | ORAL | Status: DC | PRN

## 2015-09-14 NOTE — Telephone Encounter (Signed)
Radonc appointment already on schedule. Spoke with wife re f/u with FS 7/17 and confirrmed radonc for 7/27.

## 2015-09-14 NOTE — Progress Notes (Signed)
The results of MRI was reviewed today and discussed with Dr. Tammi Klippel. His results were also discussed with the patient over the phone. He would be a good candidate for radiation therapy and the appropriate referral is made. He continues to ambulate without any difficulties and attending to her activities of daily living. He denied any neurological deficits. He is having pain issues that is not relieved by tramadol.  The plan is to set up radiation appointment for him in the near future and use hydrocodone for pain control. He'll have an M.D. follow-up on 09/17/2015. He was given clear instructions to report to emergency department if he develops any neurological deficits.

## 2015-09-17 ENCOUNTER — Telehealth: Payer: Self-pay | Admitting: Pharmacist

## 2015-09-17 ENCOUNTER — Telehealth: Payer: Self-pay | Admitting: Oncology

## 2015-09-17 ENCOUNTER — Ambulatory Visit (HOSPITAL_BASED_OUTPATIENT_CLINIC_OR_DEPARTMENT_OTHER): Payer: Medicare Other | Admitting: Oncology

## 2015-09-17 VITALS — BP 141/79 | HR 74 | Temp 98.4°F | Resp 18 | Ht 68.0 in | Wt 199.5 lb

## 2015-09-17 DIAGNOSIS — N289 Disorder of kidney and ureter, unspecified: Secondary | ICD-10-CM

## 2015-09-17 DIAGNOSIS — C786 Secondary malignant neoplasm of retroperitoneum and peritoneum: Secondary | ICD-10-CM

## 2015-09-17 DIAGNOSIS — C787 Secondary malignant neoplasm of liver and intrahepatic bile duct: Secondary | ICD-10-CM

## 2015-09-17 DIAGNOSIS — G893 Neoplasm related pain (acute) (chronic): Secondary | ICD-10-CM | POA: Diagnosis not present

## 2015-09-17 DIAGNOSIS — C641 Malignant neoplasm of right kidney, except renal pelvis: Secondary | ICD-10-CM

## 2015-09-17 DIAGNOSIS — C649 Malignant neoplasm of unspecified kidney, except renal pelvis: Secondary | ICD-10-CM

## 2015-09-17 MED ORDER — CABOZANTINIB S-MALATE 60 MG PO TABS: 60.0000 mg | ORAL_TABLET | Freq: Every day | ORAL | Status: DC

## 2015-09-17 NOTE — Telephone Encounter (Signed)
Gave pt avs °

## 2015-09-17 NOTE — Progress Notes (Signed)
Patient given EASE access services form to fill out and given a self addressed envelope to mail back to [place]. New script given to brandi, pharmacist for assistance

## 2015-09-17 NOTE — Progress Notes (Signed)
Hematology and Oncology Follow Up Visit  Derek [name] [id number] 10/16/1950 65 y.o. 09/17/2015 9:05 AM   Principle Diagnosis: 65 year old with renal cell carcinoma. He was diagnosed in August 2016 measuring 15.3 x 10.9 x 3.0 cm in the right kidney. He has retroperitoneal lymphadenopathy and a 2.7 cm liver mass indicating stage IV disease.    Prior Therapy: He is S/P right open radical nephrectomy and retroperitoneal lymph node dissection done on 11/28/2014. He also had partial right hepatectomy.  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 Metastatic lymph node involvement noted in the sampled lymph nodes. He also had involvement of renal cell carcinoma in the liver resection sample.  Current therapy: Votrient 800 mg daily started on 05/29/2015. He is currently taking 600 mg since 06/22/2015.  Interim History:  Derek [name] presents today for a follow-up visit. Since the last visit, he reports symptoms of increased back pain that started 2 weeks ago. He was seen urgently in the emergency department on 09/10/2015 and a CT scan of the abdomen and pelvis at that time showed progression of his disease around the nephrectomy bed invading into L1 spine. MRI of the spine was obtained which showed epidural tumor posteriorly to the L1 vertebra with involvement of the right neural foramina. He does not report any neurological deficits. Has not reported any weakness or difficulties with ambulation. He is prescribed hydrocodone which helped his pain. He does report some constipation issues and currently taken MiraLAX.  He denied any other GI comp locations including diarrhea, nausea or weight loss. His performance status and activity level remain at baseline. His appetite has decreased however and lost a few pounds.    He does not report any headaches, blurry vision, syncope or seizures. He does not report any fevers, chills, sweats. He does not report any  chest pain, palpitation, orthopnea. He does not report any cough, wheezing or dyspnea on exertion. He does not report any nausea, vomiting, abdominal pain, flank pain, or hematochezia. He does not report any frequency, urgency or hesitancy. Does not report any lymphadenopathy or petechiae. Remaining review of systems unremarkable  Medications: I have reviewed the patient's current medications.  Current Outpatient Prescriptions  Medication Sig Dispense Refill  . aspirin EC 81 MG tablet Take 81 mg by mouth daily.    . carvedilol (COREG) 6.25 MG tablet   3  . Cholecalciferol (VITAMIN D-3) 5000 UNITS TABS Take 5,000 Units by mouth daily.     . colchicine 0.6 MG tablet Reported on 08/17/2015    . docusate sodium (COLACE) 100 MG capsule Take 1 capsule (100 mg total) by mouth 2 (two) times daily. 60 capsule 0  . furosemide (LASIX) 20 MG tablet Take 1 tablet (20 mg total) by mouth daily as needed (for elevated blood pressure). 30 tablet 3  . HYDROcodone-acetaminophen (NORCO) 5-325 MG tablet Take 1 tablet by mouth every 6 (six) hours as needed for moderate pain. 30 tablet 0  . KLOR-CON M20 20 MEQ tablet TAKE ONE TABLET BY MOUTH ONCE DAILY 30 tablet 6  . lisinopril (PRINIVIL,ZESTRIL) 20 MG tablet Take 1 tablet (20 mg total) by mouth daily. 180 tablet 3  . Multiple Vitamin (MULTIVITAMIN) capsule Take 1 capsule by mouth daily.    Marland Kitchen omega-3 acid ethyl esters (LOVAZA) 1 g capsule Take 1 g by mouth daily.    . traMADol (ULTRAM) 50 MG tablet Take 1 tablet (50 mg total) by mouth every 6 (six) hours  as needed. 15 tablet 0  . Turmeric 500 MG CAPS Take 500 mg by mouth daily.     Marland Kitchen ULORIC 40 MG tablet TAKE ONE TABLET BY MOUTH ONCE DAILY 30 tablet 1  . cabozantinib S-Malate (CABOMETYX) 60 MG TABS Take 60 mg by mouth daily. 30 tablet 0   No current facility-administered medications for this visit.     Allergies:  Allergies  Allergen Reactions  . Lipitor [Atorvastatin] Other (See Comments)    Myopathy...severe,  with  Myoglobinuria, wheelchair bound for a few days  . Penicillins Anaphylaxis  . Prednisone Anxiety and Other (See Comments)    Extremely high blood pressure after taking medication for first time.    Past Medical History, Surgical history, Social history, and Family History were reviewed and updated.   Blood pressure 141/79, pulse 74, temperature 98.4 F (36.9 C), temperature source Oral, resp. rate 18, height 5\' 8"  (1.727 m), weight 199 lb 8 oz (90.493 kg), SpO2 100 %. ECOG: 0 General appearance: Alert, awake gentleman appeared without distress. Head: Normocephalic, without obvious abnormality no oral thrush. Neck: no adenopathy Lymph nodes: Cervical, supraclavicular, and axillary nodes normal. Heart:regular rate and rhythm, S1, S2 normal, no murmur, click, rub or gallop Lung:chest clear, no wheezing, rales, normal symmetric air entry.  Abdomin: soft, non-tender, without masses or organomegaly. No shifting dullness or ascites. EXT:no erythema, induration, or nodules   Lab Results: Lab Results  Component Value Date   WBC 4.9 09/10/2015   HGB 12.8* 09/10/2015   HCT 38.9* 09/10/2015   MCV 93.1 09/10/2015   PLT 267 09/10/2015     Chemistry      Component Value Date/Time   NA 139 09/10/2015 1700   NA 138 08/21/2015 1123   K 4.7 09/10/2015 1700   K 5.3* 08/21/2015 1123   CL 104 09/10/2015 1700   CO2 27 09/10/2015 1700   CO2 27 08/21/2015 1123   BUN 27* 09/10/2015 1700   BUN 23.5 08/21/2015 1123   CREATININE 1.40* 09/10/2015 1700   CREATININE 1.4* 08/21/2015 1123   CREATININE 1.28* 12/11/2014 1614      Component Value Date/Time   CALCIUM 9.5 09/10/2015 1700   CALCIUM 9.6 08/21/2015 1123   ALKPHOS 73 09/10/2015 1700   ALKPHOS 87 08/21/2015 1123   AST 17 09/10/2015 1700   AST 16 08/21/2015 1123   ALT 20 09/10/2015 1700   ALT 22 08/21/2015 1123   BILITOT 0.5 09/10/2015 1700   BILITOT 0.47 08/21/2015 1123     IMPRESSION: 1. Recurrent malignancy in the right  nephrectomy bed with an 84 cc in volume right psoas muscle mass invading greater than 50% of the right L1 vertebral body, and also invading the very top of the right L2 vertebral body. There is an associated pathologic compression fracture in the right-side of the L1 vertebral body with about 35% loss of height, and a small amount of epidural tumor posterior to the L1 vertebral body on the right and extending in the right L1-2 neural foramen. This also contributes to moderate right foraminal stenosis at L1-2. 2. Invasion of the ascending colon and liver is a distinct possibility. 3. In addition, lumbar spondylosis and degenerative disc disease cause mild to moderate impingement at L4-5 and mild impingement at L3-4 and L5-S1 as detailed above. 4. Vertebral hemangiomatosis. 5. Gallstones noted.  EXAM: CT ABDOMEN AND PELVIS WITHOUT CONTRAST  TECHNIQUE: Multidetector CT imaging of the abdomen and pelvis was performed following the standard protocol without IV contrast.  COMPARISON: CT  chest, abdomen and pelvis 08/21/2015 and 05/11/2015.  FINDINGS: There is mild cardiomegaly. Calcific coronary atherosclerosis is identified. No pleural or pericardial effusion. The lung bases are clear.  Two small low attenuating lesions in the dome of the liver on image 15 are unchanged. The larger measures 1.3 cm in diameter. Postoperative change along the right hepatic lobe is again identified. The appearance of the liver is unchanged.  The patient is status post right nephrectomy. Abnormal soft tissue attenuation in the nephrectomy bed in the right side of the L1 vertebral body is identified. Involvement in L1 was present on the prior study but more conspicuous today. Discrete measurement is difficult given lack of oral or IV contrast but the soft tissue mass measures approximately 5.7 cm transverse by 7.9 cm AP on image 36 compared to 4.7 cm transverse by 6.4 cm AP on the most  recent examination.  The spleen, adrenal glands and pancreas appear normal. Left renal cyst is unchanged. Aortoiliac atherosclerosis without aneurysm is identified.  Prostate gland is mildly prominent. Urinary bladder and seminal vesicles are unremarkable. A few colonic diverticula are seen but there is no evidence of diverticulitis. As noted above, the mass in the nephrectomy bed involves the proximal transverse colon as seen on the prior exam.  Bones demonstrate progressive destructive change in the right side of L1 from recurrent tumor as described above. Small sclerotic lesion in the L4 vertebral body is unchanged and likely benign. Lower lumbar facet arthropathy is noted.  IMPRESSION: Enlargement in the size of a mass in the right nephrectomy bed with progressive invasion of the right side of the L1 vertebral body and invasion of the proximal transverse colon and right psoas. The appearance of the abdomen and pelvis is otherwise unchanged since the most recent examination.  Gallstones without cholecystitis.   Impression and Plan:  65 year old gentleman with the following issues:  1. 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 He is status post surgical resection in September 2016 and he is fully healed at this time.  CT scan on 05/11/2015 showed relapsed disease with a mass in the right nephrectomy bed which is undoubtedly related to kidney cancer.  He is currently on Votrient and CT scan on 09/10/2015 show progression of disease. Options of therapy were reviewed today and certainly he will need a different systemic therapy. These include Nivolumab versus Cabometyx. Risks and benefits of these agents were reviewed and I feel that Cabometyx has a better response rate given his disease volume and the urgency to have a response as a priority. Complications associated with this  medication include nausea, fatigue and diarrhea and hand-foot syndrome. He is agreeable to proceed at this time.  2. Renal insufficiency: His creatinine is borderline after nephrectomy and needs to be monitored moving forward.   3. Hypertension: Seems to be manageable at this time.  4. L1 vertebral body involvement with tumor: Case was discussed with Dr. Tammi Klippel from radiation oncology. He would be a good candidate to receive palliative radiation therapy in the near future. Appointments already been scheduled at this time.  5. Pain: He is currently on hydrocodone with instructions how to use it. Aggressive bowel regimen was suggested at this time. It was also given clinical structures if he started developing diarrhea to reduce his daily MiraLAX at that time.  6. Follow-up: Will be in the next few weeks to assess his tolerance to this medication.  [facility], MD 7/17/20179:05 AM

## 2015-09-17 NOTE — Telephone Encounter (Addendum)
Received fax communication from [employer]. Cabometyx needs prior authorization. PA done through covermymeds. The request was successfully submitted -> covermymeds key: [place], PharmD, BCPS, [facility] [phone]

## 2015-09-17 NOTE — Telephone Encounter (Signed)
New Rx for Cabometyx faxed to [facility]. Fax confirmation received.

## 2015-09-17 NOTE — Telephone Encounter (Signed)
Received fax from [place] at [facility] for [employer] Request form. Further information is needed. I completed form. Will get MD signature on 09/18/15 and then fax to [place], PharmD, BCPS, [facility] [phone]

## 2015-09-18 ENCOUNTER — Encounter: Payer: Self-pay | Admitting: Oncology

## 2015-09-18 MED FILL — *CABOMETYX 60 MG TABLET: 60 | 30 days supply | Qty: 30 | Fill #0

## 2015-09-18 NOTE — Progress Notes (Signed)
Per Katherine Roan at [facility] cabometyx approved 09/18/15-09/17/16 ref#[id number]. I let Dixie know and placed info on desk for [place]. [facility] phar notified.

## 2015-09-19 ENCOUNTER — Other Ambulatory Visit: Payer: Self-pay | Admitting: *Deleted

## 2015-09-19 ENCOUNTER — Telehealth: Payer: Self-pay | Admitting: Pharmacist

## 2015-09-19 DIAGNOSIS — C649 Malignant neoplasm of unspecified kidney, except renal pelvis: Secondary | ICD-10-CM

## 2015-09-19 MED ORDER — HYDROCODONE-ACETAMINOPHEN 5-325 MG PO TABS: 1.0000 | ORAL_TABLET | Freq: Four times a day (QID) | ORAL | Status: DC | PRN

## 2015-09-19 NOTE — Telephone Encounter (Signed)
I received correspondence from [facility].  Pt has $0 copay - copay was picked up by the [place]. I called pt & s/w his wife.  She said they've already been contacted and Rx ready for p/u.  They'll go get it today. Start date for Cabometyx = 09/20/15. We'll call pt 1 week from then.

## 2015-09-19 NOTE — Telephone Encounter (Signed)
Wife calling for refill on hydrocodone 5-325. They are going out of town and he will not have enough to last. Signed script left at front for patient p/u.

## 2015-09-19 NOTE — Telephone Encounter (Signed)
Oral Chemotherapy Pharmacist Encounter  I spoke with patient and his wife Jolayne Haines over phone today for overview of new oral chemotherapy medication: Cabometyx.  [facility] has the drug on order and they will contact pt today w/ copay amount.  If copay too high, we will seek pt assistance.  Counseled Dr. Marland Kitchen Mrs. Dimarco on administration, dosing (he will take 60 mg daily by mouth on empty stomach first thing in the AM), side effects, safe handling, and monitoring. Side effects include but not limited to: constipation, diarrhea, N/V, fatigue, abdominal pain (GI perforation), ONJ, stomatitis, HTN.  They voiced understanding and appreciation.   I explained potential interaction w/ Furosemide & Cabometyx - Furosemide prescribed by cardiologist for HTN.  Pt uses PRN only and has used it infrequently (1-2 times/month).  Currently his BP is low and pt has appt w/ cardiologist this Friday. Furosemide is MRP2 inhibitor.  It may increase serum conc of Cabometyx.  Moderate risk; monitor therapy as recommended by [employer] Drug interactions (accessed 09/17/15).  They understand this interaction and will monitor for more pronounced adverse effects of Cabometyx when/if he has to use Furosemide. They will continue to monitor BP regularly at home.  All current questions answered.  Pt needs urine protein lab checks while on Cabometyx.  There are no orders for urine protein currently.  Will alert Dr. Alen Blew & Randolm Idol, RN.  Will follow up in 1-2 weeks for adherence and toxicity management. If copay amt is high, we'll pursue assistance.  Thank you, Kennith Center, Pharm.D., CPP 09/19/2015@11 :52 AM Oral Chemotherapy Clinic

## 2015-09-21 ENCOUNTER — Encounter: Payer: Self-pay | Admitting: Internal Medicine

## 2015-09-21 ENCOUNTER — Ambulatory Visit (INDEPENDENT_AMBULATORY_CARE_PROVIDER_SITE_OTHER): Payer: Medicare Other | Admitting: Internal Medicine

## 2015-09-21 VITALS — BP 141/85 | HR 55 | Ht 68.0 in | Wt 200.8 lb

## 2015-09-21 DIAGNOSIS — Z951 Presence of aortocoronary bypass graft: Secondary | ICD-10-CM

## 2015-09-21 DIAGNOSIS — I255 Ischemic cardiomyopathy: Secondary | ICD-10-CM | POA: Diagnosis not present

## 2015-09-21 DIAGNOSIS — Z79899 Other long term (current) drug therapy: Secondary | ICD-10-CM

## 2015-09-21 DIAGNOSIS — E785 Hyperlipidemia, unspecified: Secondary | ICD-10-CM | POA: Diagnosis not present

## 2015-09-21 DIAGNOSIS — I10 Essential (primary) hypertension: Secondary | ICD-10-CM

## 2015-09-21 DIAGNOSIS — C649 Malignant neoplasm of unspecified kidney, except renal pelvis: Secondary | ICD-10-CM | POA: Diagnosis not present

## 2015-09-21 MED ORDER — LISINOPRIL 40 MG PO TABS: 40.0000 mg | ORAL_TABLET | Freq: Every day | ORAL | Status: DC

## 2015-09-21 NOTE — Progress Notes (Signed)
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 Unfortunately he has an aversion to physicians and has not seen a doctor in about 40 years. He recently presented to urgent care at [facility] for progressive cough and was noted to be markedly hypertensive on presentation with a blood pressure of 205/112. Laboratory work revealed an elevated BNP of 1971. Chest x-ray demonstrated cardiomegaly without overt congestion. He denied any shortness of breath or worsening chest pain. He had been on aspirin for a long time for prophylaxis. He was recommended he start on Coreg 3.125 mg twice daily, enalapril 5 mg daily and furosemide 20 mg daily. He reports over the next couple of days a marked improvement in his cough and the fact that he lost about 3 pounds. Blood pressure is notably improved today at 130/82. EKG in the office demonstrates normal sinus rhythm with LVH by voltage at a rate of 93. There is no significant history of hypertension or heart disease in the family. Both parents had lung cancer and died of that.  [name] returns today for follow-up. Unfortunately before we could perform any of the tests we've ordered he presented with chest pain the setting of steroid use for gout. This ultimately led to non-ST elevation MI and he underwent heart catheterization. Unfortunately this demonstrated multivessel coronary disease as follows:  Coronary angiography: Coronary dominance: right  Left mainstem: The left main is patent with mild 20% distal left main stenosis extending into the LAD/left circumflex bifurcation.  Left anterior descending (LAD): The LAD has 50% ostial stenosis. The vessel is diffusely diseased. There is an ulcerated area in the proximal vessel which may represent an occluded diagonal branch. There is diffuse 90% stenosis in the LAD just after the  first perforator. The mid LAD has diffuse irregularity without high-grade obstruction. The distal LAD is patent also with diffuse irregularity. The first visualized diagonal has 80-90% stenosis, but it is a tiny vessel (less than 1 mm). The second diagonal has moderate diffuse 70% stenosis. It is also relatively small vessel of approximately 1-1.5 mm.  Left circumflex (LCx): The left circumflex is critically diseased. The vessel has 95% proximal stenosis extending back to the ostium. The mid vessel has 80% stenosis. There is diffuse calcification present. There are 2 obtuse marginal branches without significant disease.  Right coronary artery (RCA): 100% occlusion of the proximal vessel. There is left to right collateral filling the PDA branch, distal RCA, and mid RCA.  Left ventriculography: There is global and segmental LV systolic dysfunction. There is mild diffuse hypokinesis of the anterolateral and apical segments. The inferior wall is akinetic from the base to the mid ventricle. The estimated LVEF is 35%.   Estimated Blood Loss: Minimal  Final Conclusions:  1. Severe three-vessel coronary artery disease with total occlusion of the right coronary artery and left-to-right collaterals, severe stenosis of the LAD, and severe diffuse stenosis of the left circumflex 2. Moderately severe segmental LV systolic dysfunction  He was then referred to for coronary artery artery bypass grafting. He underwent CABG 4 by Derek [name] with Left internal mammary graft to the LAD, SVG to diagonal, SVG to OM, and SVG to PDA.  Postoperatively he developed another recurrence of gout in the right knee and was treated with colchicine and this is resolved. He has never had uric acid evaluation. At this  point his wife's main concern is that he is acting somewhat depressed, he is somewhat withdrawn, tearful and not as engaged in certain activities. She is concerned about postsurgical depression. He has not returned to work  and was a very active prior to surgery. He is interested in cardiac rehabilitation.  At the pleasure seeing Derek [name] back in the office today. In the interim he seen Derek [name] and had some changes in his medications. His last office visit I recommended starting him on Zoloft, however he felt like he was in a fog on that medication. Subsequently discontinued it and then ultimately he reported that getting back to work actually help with his depression. He seems quite bright and in good spirits today. His wife notes a marked improvement. Secondly, he has been taken off of his Lipitor. He had severe myalgias and myopathy with this medication. In fact he developed dark urine which sounds like myoglobin. He was so debilitated that the family almost had to get a wheelchair for him to get around. He is not interested in taking any further statins because of this. His symptoms did recover after a few days off of his medicine.  I saw Derek [name] back in the office today for follow-up. He's had a difficult go and it after his bypass surgery. Unfortunately he was diagnosed with renal cell carcinoma which is metastatic. He underwent nephrectomy and lymph node dissection. There apparently was spread to the liver and he had a partial hepatic resection. Fortunately, the surgeon felt that he was successful in removing all of the tumor. Follow-up so far has revealed no recurrence. He is declined chemotherapy. In general he feels well. 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 Blood pressure appears slightly high today. He is overdue for echocardiography.  09/21/2015  Derek [name] was seen in the office today in follow-up. Overall he is doing fairly well from a cardiac standpoint. 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 There is plan for possible gamma knife radiation. He's  currently on different chemotherapy which she will likely be on lifelong. He continues to have persistently elevated blood pressures. His creatinine has been stable about 1.3 and is functioning with a solitary kidney.  PMHx:  Past Medical History  Diagnosis Date  . Hypertension   . Gout   . NSTEMI (non-ST elevated myocardial infarction) ([place])   . Arthritis   . Shingles 7/15  . Renal cell carcinoma ([place]) 11/28/2014    Past Surgical History  Procedure Laterality Date  . Knee surgery    . Coronary artery bypass graft N/A 01/27/2014    Procedure: CORONARY ARTERY BYPASS GRAFTING (CABG), ON PUMP, TIMES FOUR, USING LEFT INTERNAL MAMMARY ARTERY, RIGHT GREATER SAPHENOUS VEIN HARVESTED ENDOSCOPICALLY;  Surgeon: Gaye Pollack, MD;  Location: [place];  Service: Open Heart Surgery;  Laterality: N/A;  LIMA-LAD; SVG-OM; SVG-PD; SVG-DIAG  . Tee without cardioversion N/A 01/27/2014    Procedure: TRANSESOPHAGEAL ECHOCARDIOGRAM (TEE);  Surgeon: Gaye Pollack, MD;  Location: [place];  Service: Open Heart Surgery;  Laterality: N/A;  . Left heart catheterization with coronary angiogram N/A 01/23/2014    Procedure: LEFT HEART CATHETERIZATION WITH CORONARY ANGIOGRAM;  Surgeon: Blane Ohara, MD;  Location: [facility] CATH LAB;  Service: Cardiovascular;  Laterality: N/A;  . Open partial hepatectomy [83] N/A 11/28/2014    Procedure:  OPEN PARTIAL HEPATECTOMY;  Surgeon: [name], MD;  Location: [place];  Service: General;  Laterality: N/A;  . Nephrectomy Right 11/28/2014    Procedure: RIGHT OPEN NEPHRECTOMY;  Surgeon: Cleon Gustin, MD;  Location: [place];  Service: Urology;  Laterality: Right;    FAMHx:  Family History  Problem Relation Age of Onset  . Cancer Mother   . Cancer Father     SOCHx:   reports that he has never smoked. He has never used smokeless tobacco. He reports that he drinks alcohol. He reports that he does not use illicit drugs.  ALLERGIES:  Allergies  Allergen Reactions  . Lipitor  [Atorvastatin] Other (See Comments)    Myopathy...severe, with  Myoglobinuria, wheelchair bound for a few days  . Penicillins Anaphylaxis  . Prednisone Anxiety and Other (See Comments)    Extremely high blood pressure after taking medication for first time.    ROS: A comprehensive review of systems was negative.  HOME MEDS: Current Outpatient Prescriptions  Medication Sig Dispense Refill  . aspirin EC 81 MG tablet Take 81 mg by mouth daily.    . cabozantinib S-Malate (CABOMETYX) 60 MG TABS Take 60 mg by mouth daily. 30 tablet 0  . carvedilol (COREG) 6.25 MG tablet   3  . Cholecalciferol (VITAMIN D-3) 5000 UNITS TABS Take 5,000 Units by mouth daily.     . colchicine 0.6 MG tablet Reported on 08/17/2015    . docusate sodium (COLACE) 100 MG capsule Take 1 capsule (100 mg total) by mouth 2 (two) times daily. 60 capsule 0  . HYDROcodone-acetaminophen (NORCO) 5-325 MG tablet Take 1 tablet by mouth every 6 (six) hours as needed for moderate pain. 30 tablet 0  . KLOR-CON M20 20 MEQ tablet TAKE ONE TABLET BY MOUTH ONCE DAILY 30 tablet 6  . lisinopril (PRINIVIL,ZESTRIL) 40 MG tablet Take 1 tablet (40 mg total) by mouth daily. 90 tablet 3  . Multiple Vitamin (MULTIVITAMIN) capsule Take 1 capsule by mouth daily.    Marland Kitchen omega-3 acid ethyl esters (LOVAZA) 1 g capsule Take 1 g by mouth daily.    . traMADol (ULTRAM) 50 MG tablet Take 1 tablet (50 mg total) by mouth every 6 (six) hours as needed. 15 tablet 0  . Turmeric 500 MG CAPS Take 500 mg by mouth daily.     Marland Kitchen ULORIC 40 MG tablet TAKE ONE TABLET BY MOUTH ONCE DAILY 30 tablet 1   No current facility-administered medications for this visit.    LABS/IMAGING: No results found for this or any previous visit (from the past 48 hour(s)). No results found.  VITALS: BP 141/85 mmHg  Pulse 55  Ht 5\' 8"  (1.727 m)  Wt 200 lb 12.8 oz (91.082 kg)  BMI 30.54 kg/m2  EXAM: General appearance: alert and no distress Neck: no carotid bruit and no JVD Lungs:  clear to auscultation bilaterally Heart: regular rate and rhythm, S1, S2 normal and no S3 or S4 Abdomen: soft, non-tender; bowel sounds normal; no masses,  no organomegaly Extremities: extremities normal, atraumatic, no cyanosis or edema Pulses: 2+ and symmetric Skin: Skin color, texture, turgor normal. No rashes or lesions Neurologic: Grossly normal Psych: Pleasant mood, normal affect  EKG: Sinus bradycardia with sinus arrhythmia 55, ST-T wave abnormalities inferiorly and laterally  ASSESSMENT: 1. Coronary artery disease status post CABG 4 (LIMA to LAD, SVG to OM, SVG to diagonal and SVG to PDA) - 01/2014 2. Ischemic cardiomyopathy-EF 40-45% 3. LVH by voltage 4. Hypertension-controlled 5. Post operative  depression - resolved 6. Recurrent gout 7. Dyslipidemia-severe myopathy with Lipitor 8. Metastatic renal cell carcinoma-status post resection  PLAN: 1.   Derek [name] unfortunately has a tumor which apparently is adjacent to the L1 level the spine causing significant back pain. He's had a change in his current chemotherapy regimen and is likely to undergo radiation therapy. Blood pressure remains elevated despite titration of his medications. Creatinine has been stable at 1.3. I like to increase his lisinopril to 40 mg daily today. We'll recheck a metabolic profile next week. He has a follow-up with Claiborne Billings our pharmacist on August 11. She can further adjust medications at that time if necessary, however feel this will likely get him to goal as long as renal function is stable.  Follow-up with me in 6 months.  [name], MD, [facility] Attending Cardiologist [place] 09/21/2015, 8:55 AM

## 2015-09-21 NOTE — Patient Instructions (Signed)
Your physician has recommended you make the following change in your medication: INCREASE lisinopril to 40mg  once daily. You can take 2 of your 20mg  tablets until you run out. A new prescription has been sent to the pharmacy.   Your physician recommends that you return for lab work NEXT WEEK (BMET)  Your physician wants you to follow-up in: 6 months with Dr. [name]. You will receive a reminder letter in the mail two months in advance. If you don't receive a letter, please call our office to schedule the follow-up appointment.

## 2015-09-21 NOTE — Telephone Encounter (Signed)
Spoke with pts wife and she states that he has not ever been on Zoloft and he would not need any refills on that medication. Pt was advised to follow up with pcp as needed.

## 2015-09-21 NOTE — Telephone Encounter (Signed)
I got note from cardiologist today. He was depressed recently. They put him on sertraline. He has not seen in me in some time with me. He sees a lot of specialist. I could see him if for depression if needed. If he schedules appointment make sure 30 minutes avoid Thursday.

## 2015-09-25 ENCOUNTER — Other Ambulatory Visit: Payer: Self-pay | Admitting: Pharmacist

## 2015-09-25 DIAGNOSIS — C649 Malignant neoplasm of unspecified kidney, except renal pelvis: Secondary | ICD-10-CM

## 2015-09-25 NOTE — Progress Notes (Signed)
Oral Chemotherapy Pharmacist Encounter  Received patient assistance information for Cabometyx through [employer] access program from [place], [place]. We are aware that patient has been approved for [employer] and has been awarded [employer] co-pay for current prescription. Patient assistance application filled out for after foundation grant is exhausted.  Pt completed pages 1 and 3. Pharmacist completed page 2 and was signed by Dr. Alen Blew. Completed enrollment application with copies of insurance cards faxed to EASE program.  Noted Cabometyx start date of 7/20. Oral Chemo Clinic to follow-up with patient at end of week for toxicity and/or compliance management.   Noted MD follow-up and lab scheduled for 10/16/15. Urine protein lab added to standing orders to be performed at this visit.  Oral Chemo Clinic to follow-up. Will alert [facility] with results for assistance application.  Johny Drilling, PharmD, BCPS Oral Chemotherapy Clinic

## 2015-09-26 ENCOUNTER — Telehealth: Payer: Self-pay | Admitting: *Deleted

## 2015-09-26 ENCOUNTER — Other Ambulatory Visit: Payer: Self-pay | Admitting: *Deleted

## 2015-09-26 MED ORDER — HYDROMORPHONE HCL 4 MG PO TABS: ORAL_TABLET | ORAL | 0 refills | Status: DC

## 2015-09-26 MED FILL — HYDROmorphone HCL 4 MG TABS: 4 | 5 days supply | Qty: 30 | Fill #0

## 2015-09-26 NOTE — Telephone Encounter (Signed)
Wife bobbie calling to say hydrocodone 5-325 not helping, patient unable to get up or walk. Unable to get up during the night to urinate, had to have urinal brought to him. Per dr Alen Blew dilaudid 4 mg tabs ordered. Take 1 tablet every 2-4 hours prn pain. If patient unable to walk he is to report to the E.D. Wife verbalized understanding

## 2015-09-27 ENCOUNTER — Encounter: Payer: Self-pay | Admitting: Radiation Oncology

## 2015-09-27 ENCOUNTER — Ambulatory Visit
Admission: RE | Admit: 2015-09-27 | Discharge: 2015-09-27 | Disposition: A | Discharge status: Home / Self Care | Payer: Medicare Other | Source: Ambulatory Visit | Attending: Radiation Oncology | Admitting: Radiation Oncology

## 2015-09-27 VITALS — BP 140/80 | HR 90 | Resp 18 | Ht 68.0 in | Wt 199.9 lb

## 2015-09-27 DIAGNOSIS — Z510 Encounter for antineoplastic radiation therapy: Secondary | ICD-10-CM | POA: Insufficient documentation

## 2015-09-27 DIAGNOSIS — C7951 Secondary malignant neoplasm of bone: Secondary | ICD-10-CM | POA: Diagnosis not present

## 2015-09-27 DIAGNOSIS — C7949 Secondary malignant neoplasm of other parts of nervous system: Secondary | ICD-10-CM | POA: Diagnosis not present

## 2015-09-27 DIAGNOSIS — C649 Malignant neoplasm of unspecified kidney, except renal pelvis: Secondary | ICD-10-CM

## 2015-09-27 HISTORY — DX: Malignant (primary) neoplasm, unspecified: C80.1

## 2015-09-27 HISTORY — DX: Malignant neoplasm of bone and articular cartilage, unspecified: C41.9

## 2015-09-27 MED ORDER — MORPHINE SULFATE ER 30 MG PO TBCR: 30.0000 mg | EXTENDED_RELEASE_TABLET | Freq: Three times a day (TID) | ORAL | 0 refills | Status: DC

## 2015-09-27 MED ORDER — HYDROMORPHONE HCL 4 MG PO TABS: ORAL_TABLET | ORAL | 0 refills | Status: DC

## 2015-09-27 MED FILL — MORPHINE SULF 30 MG TAB SA: 30 | 30 days supply | Qty: 90 | Fill #0

## 2015-09-27 NOTE — Progress Notes (Signed)
  Radiation Oncology         [phone]) [phone] ________________________________  Name: Derek [name] MRN: [id number]  Date: 09/28/2015  DOB: 01-10-1951  STEREOTACTIC BODY RADIOTHERAPY SIMULATION AND TREATMENT PLANNING NOTE    ICD-9-CM ICD-10-CM   1. Metastatic renal cell carcinoma, unspecified laterality (HCC) 189.0 C64.9    199.1    2. L1 Metastasis 198.3 C79.49     DIAGNOSIS:  L1 met from renal cell ca  NARRATIVE:  The patient was brought to the [facility].  Identity was confirmed.  All relevant records and images related to the planned course of therapy were reviewed.  The patient freely provided informed written consent to proceed with treatment after reviewing the details related to the planned course of therapy. The consent form was witnessed and verified by the simulation staff.  Then, the patient was set-up in a stable reproducible  supine position for radiation therapy.  A BodyFix immobilization pillow was fabricated for reproducible positioning.  Surface markings were placed.  The CT images were loaded into the planning software.  The gross target volumes (GTV) and planning target volumes (PTV) were delinieated, and avoidance structures were contoured.  Treatment planning then occurred.  The radiation prescription was entered and confirmed.  A total of two complex treatment devices were fabricated in the form of the BodyFix immobilization pillow and a neck accuform cushion.  I have requested : 3D Simulation  I have requested a DVH of the following structures: targets and all normal structures near the target including solitary left kidney and spinal cord as noted on the radiation plan to maintain doses in adherence with established limits  SPECIAL TREATMENT PROCEDURE:  The planned course of therapy using radiation constitutes a special treatment procedure. Special care is required in the management of this patient for the following reasons. This treatment constitutes a Special  Treatment Procedure for the following reason: [ High dose per fraction requiring special monitoring for increased toxicities of treatment including daily imaging..  The special nature of the planned course of radiotherapy will require increased physician supervision and oversight to ensure patient's safety with optimal treatment outcomes.   PLAN:  The patient will receive 50 Gy in 5 fractions.  ________________________________  Sheral Apley Tammi Klippel, M.D.

## 2015-09-27 NOTE — Progress Notes (Signed)
See progress note under physician encounter. 

## 2015-09-27 NOTE — Progress Notes (Signed)
Histology and Location of Primary Cancer: Renal cell carcinoma. He was diagnosed in August 2016 measuring 15.3 x 10.9 x 3.0 cm in the right kidney. He has retroperitoneal lymphadenopathy and a 2.7 cm liver mass indicating stage IV disease.   Location(s) of Symptomatic Metastases: L1 pathologic compression fracture  Past/Anticipated chemotherapy by medical oncology, if any:  Prior Therapy: He is S/P right open radical nephrectomy and retroperitoneal lymph node dissection done on 11/28/2014. He also had partial right hepatectomy.  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 Metastatic lymph node involvement noted in the sampled lymph nodes. He also had involvement of renal cell carcinoma in the liver resection sample.  Current therapy: Votrient 800 mg daily started on 05/29/2015. He is currently taking 600 mg since 06/22/2015. Votrient discontinued. Therapy changed to Cobometyx by mouth daily since 7/17  Pain on a scale of 0-10 is: constant pain 7 on a scale of 0-10. Using percocet and dilaudid.    If Spine Met(s), symptoms, if any, include:  Bowel/Bladder retention or incontinence (please describe): urinary urgency, hematuria, nocturia, constipation   Numbness or weakness in extremities (please describe): right arm lateral portion intermittent numbness  Current Decadron regimen, if applicable: none. Hospitalized following an initial prednisone dose for heart attack  Ambulatory status? Walker? Wheelchair?: ambulatory  SAFETY ISSUES:  Prior radiation? no  Pacemaker/ICD? no  Possible current pregnancy? no  Is the patient on methotrexate? 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

## 2015-09-27 NOTE — Progress Notes (Signed)
Radiation Oncology         [phone] ________________________________  Initial Outpatient Consultation  Name: Derek [name] MRN: [id number]  Date: 09/27/2015  DOB: [birth year]  [id number], Derek [name], Mathis Dad, MD   REFERRING PHYSICIAN: Wyatt Portela, MD  DIAGNOSIS: The primary encounter diagnosis was Renal cell carcinoma, unspecified laterality ([place]). A diagnosis of Bone metastasis ([place]) was also pertinent to this visit.   65 yo gentleman with stage IV renal cell carcinoma with painful L1 spinal involvement.    ICD-9-CM ICD-10-CM   1. Renal cell carcinoma, unspecified laterality (HCC) 189.0 C64.9   2. Bone metastasis (HCC) 198.5 C79.51     HISTORY OF PRESENT ILLNESS: Derek [name] is a 65 y.o. male seen at the request of Dr. Alen Blew with a history of renal cell carcinoma. He was originally diagnosed with stage IV disease in August 2016 and had a 15.3 x 10.9 x 3 cm mass in the right kidney with retroperitoneal adenopathy and a 2.7 cm liver mass. He underwent right nephrectomy and partial right hepatectomy. 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 His liver specimen was also involved and of the lymph nodes sampled, disease was present. His prior systemic therapy has included Votrient beginning in March 2017 and dose reduction to 600mg  since 06/22/15. He developed severe onset of pain in his low back about two weeks ago On 09/10/15 a CT scan revealed progressive disease along the side of the nephrectomy bed extending toward and involving the L1 vertebral body. Systemic therapy was changed to Cabometyx and he plans to see Dr. Alen Blew in a few weeks, and comes today to discuss the role of radiotherapy to the L1 vertebral body with Dr. Tammi Klippel.  PREVIOUS RADIATION THERAPY: No  PAST MEDICAL HISTORY:  Past Medical History:  Diagnosis Date  . Arthritis   . Bone cancer ([place])    rcc with L1 pathologic compression fracture  . Cancer [facility])    rcc with L1  pathologic compression fx.  . Gout   . Hypertension   . NSTEMI (non-ST elevated myocardial infarction) ([place])   . Renal cell carcinoma ([place]) 11/28/2014  . Renal cell carcinoma ([place])   . Shingles 7/15      PAST SURGICAL HISTORY: Past Surgical History:  Procedure Laterality Date  . CORONARY ARTERY BYPASS GRAFT N/A 01/27/2014   Procedure: CORONARY ARTERY BYPASS GRAFTING (CABG), ON PUMP, TIMES FOUR, USING LEFT INTERNAL MAMMARY ARTERY, RIGHT GREATER SAPHENOUS VEIN HARVESTED ENDOSCOPICALLY;  Surgeon: Gaye Pollack, MD;  Location: [place];  Service: Open Heart Surgery;  Laterality: N/A;  LIMA-LAD; SVG-OM; SVG-PD; SVG-DIAG  . KNEE SURGERY    . LEFT HEART CATHETERIZATION WITH CORONARY ANGIOGRAM N/A 01/23/2014   Procedure: LEFT HEART CATHETERIZATION WITH CORONARY ANGIOGRAM;  Surgeon: Blane Ohara, MD;  Location: [facility] CATH LAB;  Service: Cardiovascular;  Laterality: N/A;  . NEPHRECTOMY Right 11/28/2014   Procedure: RIGHT OPEN NEPHRECTOMY;  Surgeon: Cleon Gustin, MD;  Location: [place];  Service: Urology;  Laterality: Right;  . OPEN PARTIAL HEPATECTOMY  N/A 11/28/2014   Procedure: OPEN PARTIAL HEPATECTOMY;  Surgeon: [name], MD;  Location: [place];  Service: General;  Laterality: N/A;  . TEE WITHOUT CARDIOVERSION N/A 01/27/2014   Procedure: TRANSESOPHAGEAL ECHOCARDIOGRAM (TEE);  Surgeon: Gaye Pollack, MD;  Location: [place];  Service: Open Heart Surgery;  Laterality: N/A;    FAMILY HISTORY:  Family History  Problem Relation Age of Onset  . Cancer Mother   . Cancer Father  SOCIAL HISTORY:  Social History   Social History  . Marital status: Married    Spouse name: N/A  . Number of children: N/A  . Years of education: N/A   Occupational History  . Dentist    Social History Main Topics  . Smoking status: Never Smoker  . Smokeless tobacco: Never Used  . Alcohol use Yes     Comment: 1-2 drinks/week  . Drug use: No  . Sexual activity: No   Other Topics Concern  . Not on file     Social History Narrative   Lives with wife    ALLERGIES: Lipitor [atorvastatin]; Penicillins; and Prednisone  MEDICATIONS:  Current Outpatient Prescriptions  Medication Sig Dispense Refill  . aspirin EC 81 MG tablet Take 81 mg by mouth daily.    . cabozantinib S-Malate (CABOMETYX) 60 MG TABS Take 60 mg by mouth daily. 30 tablet 0  . carvedilol (COREG) 12.5 MG tablet   0  . Cholecalciferol (VITAMIN D-3) 5000 UNITS TABS Take 5,000 Units by mouth daily.     . colchicine 0.6 MG tablet Reported on 08/17/2015    . docusate sodium (COLACE) 100 MG capsule Take 1 capsule (100 mg total) by mouth 2 (two) times daily. 60 capsule 0  . furosemide (LASIX) 20 MG tablet   1  . HYDROcodone-acetaminophen (NORCO) 5-325 MG tablet Take 1 tablet by mouth every 6 (six) hours as needed for moderate pain. 30 tablet 0  . HYDROmorphone (DILAUDID) 4 MG tablet 4 mg every 2-4 hours prn pain 30 tablet 0  . KLOR-CON M20 20 MEQ tablet TAKE ONE TABLET BY MOUTH ONCE DAILY 30 tablet 6  . lisinopril (PRINIVIL,ZESTRIL) 40 MG tablet Take 1 tablet (40 mg total) by mouth daily. 90 tablet 3  . Multiple Vitamin (MULTIVITAMIN) capsule Take 1 capsule by mouth daily.    Marland Kitchen omega-3 acid ethyl esters (LOVAZA) 1 g capsule Take 1 g by mouth daily.    . traMADol (ULTRAM) 50 MG tablet Take 1 tablet (50 mg total) by mouth every 6 (six) hours as needed. 15 tablet 0  . Turmeric 500 MG CAPS Take 500 mg by mouth daily.     Marland Kitchen ULORIC 40 MG tablet TAKE ONE TABLET BY MOUTH ONCE DAILY 30 tablet 1   No current facility-administered medications for this encounter.     REVIEW OF SYSTEMS:  On review of systems, the patient reports that he is doing well overall. He denies any chest pain, shortness of breath, cough, fevers, chills, night sweats, unintended weight changes. He denies any bowel or bladder disturbances, and denies abdominal pain, nausea or vomiting. He denies any new musculoskeletal or joint aches or pains. He mentions he has some numbness  that runs on the medial side of his forearm that has been there for about a month. He reports that he is in constant pain and is using percocet and dilaudid.  He mentions that his pain is directly correlated to the amount of activity he does. He reports urinary urgency, hematuria, nocturia, and constipation. He has not had a bowel movement in two days. He has been taking Miralax. He increased his stool softener intake from 1 to 3 tablets. A complete review of systems is obtained and is otherwise negative.   PHYSICAL EXAM:  height is 5\' 8"  (1.727 m) and weight is 199 lb 14.4 oz (90.7 kg). His blood pressure is 140/80 and his pulse is 90. His respiration is 18 and oxygen saturation is 100%.   Pain  Scale 7/10 In general this is a well appearing caucasian male in no acute distress. He is alert and oriented x4 and appropriate throughout the examination. HEENT reveals that the patient is normocephalic, atraumatic. EOMs are intact. PERRLA. Skin is intact without any evidence of gross lesions. Cardiovascular exam reveals a regular rate and rhythm, no clicks rubs or murmurs are auscultated. Chest is clear to auscultation bilaterally. Lymphatic assessment is performed and does not reveal any adenopathy in the cervical, supraclavicular, axillary, or inguinal chains. Abdomen has active bowel sounds in all quadrants and is intact. The abdomen is soft, non tender, non distended. Lower extremities are negative for pretibial pitting edema, deep calf tenderness, cyanosis or clubbing. Sensation is noted of his lower extremities, upper extremities. Strength of his upper extremities are 5/5 bilaterally. Grip strength is intact.    KPS = 40  100 - Normal; no complaints; no evidence of disease. 90   - Able to carry on normal activity; minor signs or symptoms of disease. 80   - Normal activity with effort; some signs or symptoms of disease. 19   - Cares for self; unable to carry on normal activity or to do active work. 60   -  Requires occasional assistance, but is able to care for most of his personal needs. 50   - Requires considerable assistance and frequent medical care. 43   - Disabled; requires special care and assistance. 83   - Severely disabled; hospital admission is indicated although death not imminent. 54   - Very sick; hospital admission necessary; active supportive treatment necessary. 10   - Moribund; fatal processes progressing rapidly. 0     - Dead  Karnofsky DA, Abelmann [place], Craver LS and Burchenal [facility] [phone]) The use of the nitrogen mustards in the palliative treatment of carcinoma: with particular reference to bronchogenic carcinoma Cancer 1 [id number]  LABORATORY DATA:  Lab Results  Component Value Date   WBC 4.9 09/10/2015   HGB 12.8 (L) 09/10/2015   HCT 38.9 (L) 09/10/2015   MCV 93.1 09/10/2015   PLT 267 09/10/2015   Lab Results  Component Value Date   NA 139 09/10/2015   K 4.7 09/10/2015   CL 104 09/10/2015   CO2 27 09/10/2015   Lab Results  Component Value Date   ALT 20 09/10/2015   AST 17 09/10/2015   ALKPHOS 73 09/10/2015   BILITOT 0.5 09/10/2015     RADIOGRAPHY: Ct Abdomen Pelvis Wo Contrast  Result Date: 09/10/2015 CLINICAL DATA:  Severe low back pain for 1 week. History of right nephrectomy for renal carcinoma. EXAM: CT ABDOMEN AND PELVIS WITHOUT CONTRAST TECHNIQUE: Multidetector CT imaging of the abdomen and pelvis was performed following the standard protocol without IV contrast. COMPARISON:  CT chest, abdomen and pelvis 08/21/2015 and 05/11/2015. FINDINGS: There is mild cardiomegaly. Calcific coronary atherosclerosis is identified. No pleural or pericardial effusion. The lung bases are clear. Two small low attenuating lesions in the dome of the liver on image 15 are unchanged. The larger measures 1.3 cm in diameter. Postoperative change along the right hepatic lobe is again identified. The appearance of the liver is unchanged. The patient is status post right nephrectomy.  Abnormal soft tissue attenuation in the nephrectomy bed in the right side of the L1 vertebral body is identified. Involvement in L1 was present on the prior study but more conspicuous today. Discrete measurement is difficult given lack of oral or IV contrast but the soft tissue mass measures approximately 5.7 cm transverse by  7.9 cm AP on image 36 compared to 4.7 cm transverse by 6.4 cm AP on the most recent examination. The spleen, adrenal glands and pancreas appear normal. Left renal cyst is unchanged. Aortoiliac atherosclerosis without aneurysm is identified. Prostate gland is mildly prominent. Urinary bladder and seminal vesicles are unremarkable. A few colonic diverticula are seen but there is no evidence of diverticulitis. As noted above, the mass in the nephrectomy bed involves the proximal transverse colon as seen on the prior exam. Bones demonstrate progressive destructive change in the right side of L1 from recurrent tumor as described above. Small sclerotic lesion in the L4 vertebral body is unchanged and likely benign. Lower lumbar facet arthropathy is noted. IMPRESSION: Enlargement in the size of a mass in the right nephrectomy bed with progressive invasion of the right side of the L1 vertebral body and invasion of the proximal transverse colon and right psoas. The appearance of the abdomen and pelvis is otherwise unchanged since the most recent examination. Gallstones without cholecystitis. Atherosclerosis. Electronically Signed   By: Inge Rise M.D.   On: 09/10/2015 17:10   Mr Lumbar Spine W Wo Contrast  Result Date: 09/14/2015 CLINICAL DATA:  Low back pain for 2 weeks. Recurrent renal cancer with prior nephrectomy. EXAM: MRI LUMBAR SPINE WITHOUT AND WITH CONTRAST TECHNIQUE: Multiplanar and multiecho pulse sequences of the lumbar spine were obtained without and with intravenous contrast. CONTRAST:  91mL MULTIHANCE GADOBENATE DIMEGLUMINE 529 MG/ML IV SOLN COMPARISON:  Multiple exams,  including 09/10/2015 FINDINGS: Segmentation: The lowest lumbar type non-rib-bearing vertebra is labeled as L5. Alignment:  No vertebral subluxation is observed. Vertebrae: Local invasion of most of the L1 vertebral body eccentric to the right by tumor noted. There is associated 35% compression fracture of the right side of the L1 vertebral body involving the superior and inferior endplates, and extensive abnormal edema and enhancement. The vertebral invasion is continuous with the mass in the right nephrectomy bed and right psoas muscle. Along the right posterior margin of the L1 vertebral body there is abnormal enhancing tissue along the epidural space as shown on image 9/9, potentially with early extension into the right L1-2 neural foramen, compatible with epidural spread of tumor, and potential tumor surrounding the right L1 nerve in the lateral extraforaminal space. In addition there is vertebral hemangiomatosis with scattered hemangiomas at all visualized lumbar levels. There is early invasion of the right upper L2 vertebral body by tumor, with associated vertebral edema along the right superior endplate continuous with the tumor in this vicinity, and indistinctness of the cortex of the vertebra shown on image 13/9 and also peri on image 11/10. Conus medullaris: Extends to the L1 level and appears normal. Paraspinal and other soft tissues: In addition to the vertebral invasion of the L1 and L2 levels, there is an enhancing mass in the right psoas muscle measuring 4.7 by 4.2 by 8.2 cm (volume = 84.2 cc), as well as a recurrence along the nephrectomy bed likely invading or involving the right colon and possibly the liver margin. Suspected cholelithiasis. Left renal cyst. Nonspecific 1.1 by 1.8 cm mass of the right adrenal gland. Disc levels: T12-L1:  Unremarkable. L1-2: As noted above, there is epidural tumor posteriorly to the L1 vertebra on the right, with probable involvement of the right neural foramen, and  probable tumor surrounding or along the right L1 nerve in the lateral extraforaminal space. The tumor and posterior bony retropulsion cause a moderate degree of right foraminal stenosis at L1-2. L2-3:  Unremarkable.  L3-4: Mild bilateral foraminal stenosis due to disc bulge and facet arthropathy. Borderline central narrowing of the thecal sac. L4-5: Mild to moderate left and mild right foraminal stenosis with mild central narrowing of the thecal sac and mild bilateral subarticular lateral recess stenosis due to disc bulge, facet arthropathy, and left foraminal disc protrusion. L5-S1: Mild bilateral foraminal stenosis due to facet arthropathy. Mild disc bulge. IMPRESSION: 1. Recurrent malignancy in the right nephrectomy bed with an 84 cc in volume right psoas muscle mass invading greater than 50% of the right L1 vertebral body, and also invading the very top of the right L2 vertebral body. There is an associated pathologic compression fracture in the right-side of the L1 vertebral body with about 35% loss of height, and a small amount of epidural tumor posterior to the L1 vertebral body on the right and extending in the right L1-2 neural foramen. This also contributes to moderate right foraminal stenosis at L1-2. 2. Invasion of the ascending colon and liver is a distinct possibility. 3. In addition, lumbar spondylosis and degenerative disc disease cause mild to moderate impingement at L4-5 and mild impingement at L3-4 and L5-S1 as detailed above. 4. Vertebral hemangiomatosis. 5. Gallstones noted. Electronically Signed   By: Van Clines M.D.   On: 09/14/2015 09:01    IMPRESSION: Mr. Szymborski is a 65 yo gentleman with renal cell carcinoma. He is a good candidate for external radiotherapy to the L1 vertebral body and extension of tumor from the nephrectomy bed. Given the radioresistance of renal cell, Dr. Tammi Klippel would favor stereotactic radiation.  PLAN: We discussed the role of radiation and its side effects.  We discussed that we can prescribe long acting morphine to help manage his pain until he can experience relief which we feel will be achieved 1-2 weeks after radiation. Dr. Tammi Klippel discussed that he would recommend 5 fractions of stereotactic radiation. He has a CT simulation appointment tomorrow at 8 am. This procedure has been fully reviewed with the patient and written informed consent has been obtained.   I recommended the use of scheduled miralax until the patient has successful bowel movements. Due to the narcotics he has been taking and will continue this will be an ongoing issue and there will be a need for daily laxative use. I have reviewed his Dilauded and he is using about 24 mg per day, the conversion is just over 90 mg of Morphine a day. A prescription for MS Contin 30 mg TID is given and a new prescription for Dilaudid 4 mg for breakthru was provided. We discussed precautions for contacting our office particularly with the risk of cord compromise.   We will continue to follow his symptoms in his right forearm, if this progresses we may consider additional imaging. He will keep [place] informed of changes in his symptoms.  The above documentation reflects my direct findings during this shared patient visit. Please see the separate note by Dr. Tammi Klippel on this date for the remainder of the patient's plan of care.   Carola Rhine, PAC    This document serves as a record of services personally performed by Dr. Tammi Klippel, MD and Shona Simpson, [facility]. It was created on their behalf by Lendon Collar, a trained medical scribe. The creation of this record is based on the scribe's personal observations and the provider's statements to them. This document has been checked and approved by the attending provider.

## 2015-09-28 ENCOUNTER — Ambulatory Visit: Admission: RE | Admit: 2015-09-28 | Payer: Medicare Other | Source: Ambulatory Visit | Admitting: Radiation Oncology

## 2015-09-28 ENCOUNTER — Ambulatory Visit
Admission: RE | Admit: 2015-09-28 | Discharge: 2015-09-28 | Disposition: A | Discharge status: Home / Self Care | Payer: Medicare Other | Source: Ambulatory Visit | Attending: Radiation Oncology | Admitting: Radiation Oncology

## 2015-09-28 ENCOUNTER — Ambulatory Visit: Payer: Medicare Other | Admitting: Oncology

## 2015-09-28 DIAGNOSIS — C649 Malignant neoplasm of unspecified kidney, except renal pelvis: Secondary | ICD-10-CM | POA: Diagnosis not present

## 2015-09-28 DIAGNOSIS — C7951 Secondary malignant neoplasm of bone: Secondary | ICD-10-CM | POA: Diagnosis not present

## 2015-09-28 DIAGNOSIS — C7949 Secondary malignant neoplasm of other parts of nervous system: Secondary | ICD-10-CM

## 2015-09-28 DIAGNOSIS — Z510 Encounter for antineoplastic radiation therapy: Secondary | ICD-10-CM | POA: Diagnosis not present

## 2015-10-01 ENCOUNTER — Ambulatory Visit
Admission: RE | Admit: 2015-10-01 | Discharge: 2015-10-01 | Disposition: A | Discharge status: Home / Self Care | Payer: Medicare Other | Source: Ambulatory Visit | Attending: Radiation Oncology | Admitting: Radiation Oncology

## 2015-10-01 DIAGNOSIS — C7949 Secondary malignant neoplasm of other parts of nervous system: Secondary | ICD-10-CM

## 2015-10-01 MED FILL — HYDROmorphone HCL 4 MG TABS: 4 | 10 days supply | Qty: 60 | Fill #0

## 2015-10-01 NOTE — Progress Notes (Signed)
Additional scan performed today since initial CT was showed incomplete imaging near edge of treated target.

## 2015-10-03 ENCOUNTER — Telehealth: Payer: Self-pay | Admitting: Pharmacist

## 2015-10-03 DIAGNOSIS — C649 Malignant neoplasm of unspecified kidney, except renal pelvis: Secondary | ICD-10-CM | POA: Diagnosis not present

## 2015-10-03 DIAGNOSIS — C7949 Secondary malignant neoplasm of other parts of nervous system: Secondary | ICD-10-CM | POA: Diagnosis not present

## 2015-10-03 DIAGNOSIS — Z510 Encounter for antineoplastic radiation therapy: Secondary | ICD-10-CM | POA: Diagnosis not present

## 2015-10-03 DIAGNOSIS — C7951 Secondary malignant neoplasm of bone: Secondary | ICD-10-CM | POA: Diagnosis not present

## 2015-10-03 NOTE — Telephone Encounter (Signed)
Oral Chemotherapy Pharmacist Encounter   Received call from [employer] about needing to fill out patient name and DOB in another field on [employer] patient authorization form of [employer] application.  I informed [employer] program that patient had been approved for [employer]. Patient will not receive assistance through [employer] at this time, however, his application will be kept on file for when the [facility] funds are exhausted.  I will complete the needed information and fax to [employer] at [phone] Case # [id number]  Johny Drilling, PharmD, BCPS [facility]otherapy [facility]

## 2015-10-05 ENCOUNTER — Ambulatory Visit: Payer: Medicare Other | Admitting: Radiation Oncology

## 2015-10-05 ENCOUNTER — Other Ambulatory Visit: Payer: Self-pay | Admitting: Internal Medicine

## 2015-10-05 DIAGNOSIS — C7949 Secondary malignant neoplasm of other parts of nervous system: Secondary | ICD-10-CM | POA: Diagnosis not present

## 2015-10-05 DIAGNOSIS — C649 Malignant neoplasm of unspecified kidney, except renal pelvis: Secondary | ICD-10-CM | POA: Diagnosis not present

## 2015-10-05 DIAGNOSIS — Z510 Encounter for antineoplastic radiation therapy: Secondary | ICD-10-CM | POA: Diagnosis not present

## 2015-10-08 ENCOUNTER — Ambulatory Visit: Payer: Medicare Other | Admitting: Radiation Oncology

## 2015-10-08 ENCOUNTER — Telehealth: Payer: Self-pay | Admitting: Radiation Oncology

## 2015-10-08 DIAGNOSIS — C649 Malignant neoplasm of unspecified kidney, except renal pelvis: Secondary | ICD-10-CM | POA: Diagnosis not present

## 2015-10-08 DIAGNOSIS — Z510 Encounter for antineoplastic radiation therapy: Secondary | ICD-10-CM | POA: Diagnosis not present

## 2015-10-08 DIAGNOSIS — C7949 Secondary malignant neoplasm of other parts of nervous system: Secondary | ICD-10-CM | POA: Diagnosis not present

## 2015-10-08 NOTE — Telephone Encounter (Signed)
Received printed note from [facility] that the patient's wife, Jolayne Haines, called in over the weekend. She reports her husband had his first treatment and was nauseated. Phoned wife to inquire. She states, "he is doing much better." She goes onto explain Zofran 4 mg tid was called in. Explained that if she felt her husband may be dehydrated to present to rad onc nursing following sbrt today for bp check. She verbalized understanding and expressed appreciation for the call.

## 2015-10-08 NOTE — Telephone Encounter (Signed)
Rx(s) sent to pharmacy electronically.  

## 2015-10-10 ENCOUNTER — Ambulatory Visit
Admission: RE | Admit: 2015-10-10 | Discharge: 2015-10-10 | Disposition: A | Discharge status: Home / Self Care | Payer: Medicare Other | Source: Ambulatory Visit | Attending: Radiation Oncology | Admitting: Radiation Oncology

## 2015-10-10 DIAGNOSIS — C649 Malignant neoplasm of unspecified kidney, except renal pelvis: Secondary | ICD-10-CM | POA: Diagnosis not present

## 2015-10-10 DIAGNOSIS — C7949 Secondary malignant neoplasm of other parts of nervous system: Secondary | ICD-10-CM | POA: Diagnosis not present

## 2015-10-10 DIAGNOSIS — Z510 Encounter for antineoplastic radiation therapy: Secondary | ICD-10-CM | POA: Diagnosis not present

## 2015-10-10 MED FILL — LORazepam 0.5 MG TABS: 0.5 | 20 days supply | Qty: 60 | Fill #0

## 2015-10-12 ENCOUNTER — Ambulatory Visit
Admission: RE | Admit: 2015-10-12 | Discharge: 2015-10-12 | Disposition: A | Discharge status: Home / Self Care | Payer: Medicare Other | Source: Ambulatory Visit | Attending: Radiation Oncology | Admitting: Radiation Oncology

## 2015-10-12 ENCOUNTER — Ambulatory Visit (INDEPENDENT_AMBULATORY_CARE_PROVIDER_SITE_OTHER): Payer: Medicare Other | Admitting: Pharmacist

## 2015-10-12 ENCOUNTER — Encounter: Payer: Self-pay | Admitting: Pharmacist

## 2015-10-12 VITALS — BP 134/82 | HR 58 | Wt 193.8 lb

## 2015-10-12 DIAGNOSIS — I255 Ischemic cardiomyopathy: Secondary | ICD-10-CM | POA: Diagnosis not present

## 2015-10-12 DIAGNOSIS — I10 Essential (primary) hypertension: Secondary | ICD-10-CM | POA: Diagnosis not present

## 2015-10-12 DIAGNOSIS — C649 Malignant neoplasm of unspecified kidney, except renal pelvis: Secondary | ICD-10-CM | POA: Diagnosis not present

## 2015-10-12 DIAGNOSIS — C7949 Secondary malignant neoplasm of other parts of nervous system: Secondary | ICD-10-CM | POA: Diagnosis not present

## 2015-10-12 DIAGNOSIS — Z79899 Other long term (current) drug therapy: Secondary | ICD-10-CM | POA: Diagnosis not present

## 2015-10-12 DIAGNOSIS — Z510 Encounter for antineoplastic radiation therapy: Secondary | ICD-10-CM | POA: Diagnosis not present

## 2015-10-12 DIAGNOSIS — C641 Malignant neoplasm of right kidney, except renal pelvis: Secondary | ICD-10-CM | POA: Diagnosis not present

## 2015-10-12 NOTE — Patient Instructions (Signed)
Check your blood pressure at home daily (if able) and keep record of the readings.  Take your BP meds as follows: Continue same medications  Bring all of your meds, your BP cuff and your record of home blood pressures to your next appointment.  Exercise as you're able, try to walk approximately 30 minutes per day.  Keep salt intake to a minimum, especially watch canned and prepared boxed foods.  Eat more fresh fruits and vegetables and fewer canned items.  Avoid eating in fast food restaurants.    HOW TO TAKE YOUR BLOOD PRESSURE: . Rest 5 minutes before taking your blood pressure. .  Don't smoke or drink caffeinated beverages for at least 30 minutes before. . Take your blood pressure before (not after) you eat. . Sit comfortably with your back supported and both feet on the floor (don't cross your legs). . Elevate your arm to heart level on a table or a desk. . Use the proper sized cuff. It should fit smoothly and snugly around your bare upper arm. There should be enough room to slip a fingertip under the cuff. The bottom edge of the cuff should be 1 inch above the crease of the elbow. . Ideally, take 3 measurements at one sitting and record the average.

## 2015-10-12 NOTE — Assessment & Plan Note (Signed)
BP is at goal today. No medication changes for BP today. BMET today to look at potassium and kidney function after increase dose of lisinopril. Once BMET returns we will decide what to do with potassium supplement (Cabometyx can cause hypokalemia ~18% so may still have a small potassium requirement). Will have him follow up in hypertension clinic in 2 months.

## 2015-10-12 NOTE — Progress Notes (Signed)
Patient ID: Derek [name]                 DOB: Jul 29, 1950                      MRN: [id number]     HPI: Derek [name] is a 65 y.o. male patient of Dr. [name] with PMH below who presents today for hypertension follow-up with his wife. He was recently diagnosed with metastatic renal cell carcinoma for which he underwent a nephrectomy and lymph node dissection. He was also placed on Votrient, a medication known to increase blood pressure. He states that his dose was recently cut back to 600mg  and he has seen an improvement in blood pressure. VOTRIENT has since been replaced with CABOMETYX - which is also associated with elevated blood pressures (up to 90% reported in the literature). This medication change came about after discovering that he had a mass in his spine.   His urologist had recommended that he come off his ACEi but had mentioned that he could be on an ARB (losartan specifically). There was a bump in his creatinine after his nephrectomy and his lisinopril dose was doubled to 20mg  BID. His dose of lisinopril was subsequently reduced to 20mg  daily, but when he last saw Dr. [name] it was increased back to 40mg  daily for added BP control as Scr has stabilized.   He has had a lot of pain with the mass in his back and he was recently started on morphine and dilaudid for pain control. He feels that his pain levels are doing much better after this change. He was also put on Zofran for nausea which he has been using as sparingly as possible. I told him today that it would be ok for him to take preemptively for nausea as he has lost a significant amount of weight since I saw him last due to lack of appetite and N/V.   His wife also inquired about potassium and if the patient needed to be on it since he has not been using lasix.   Cardiac Hx: NSTEMI, CABGx4, HTN  Current HTN meds:  Carvedilol 12.5mg  BID Furosemide 20mg  PRN high blood pressure (he has been using more for swelling than high blood pressure and has  not needed any in the last month) Lisinopril 40mg  daily  Previously tried: Lisinopril 20mg  BID  BP goal: <150/90  Home BP readings:  Since his pain medication has been increased and he has been taking nausea medication his pressures have been running lower.  Over the last week 1 reading >150.  8/10 139/83  66 8/9  156/90  60 8/8  116/69  59 8/7  119/58  57    Wt Readings from Last 3 Encounters:  10/12/15 193 lb 12.8 oz (87.9 kg)  09/27/15 199 lb 14.4 oz (90.7 kg)  09/21/15 200 lb 12.8 oz (91.1 kg)   BP Readings from Last 3 Encounters:  10/12/15 134/82  09/27/15 140/80  09/21/15 (!) 141/85   Pulse Readings from Last 3 Encounters:  10/12/15 (!) 58  09/27/15 90  09/21/15 (!) 55    Renal function: CrCl cannot be calculated (Patient's most recent lab result is older than the maximum 21 days allowed.).  Past Medical History:  Diagnosis Date  . Arthritis   . Bone cancer ([place])    rcc with L1 pathologic compression fracture  . Cancer [facility])    rcc with L1 pathologic compression fx.  . Gout   .  Hypertension   . NSTEMI (non-ST elevated myocardial infarction) ([place])   . Renal cell carcinoma ([place]) 11/28/2014  . Renal cell carcinoma ([place])   . Shingles 7/15    Current Outpatient Prescriptions on File Prior to Visit  Medication Sig Dispense Refill  . aspirin EC 81 MG tablet Take 81 mg by mouth daily.    . cabozantinib S-Malate (CABOMETYX) 60 MG TABS Take 60 mg by mouth daily. 30 tablet 0  . carvedilol (COREG) 12.5 MG tablet   0  . Cholecalciferol (VITAMIN D-3) 5000 UNITS TABS Take 5,000 Units by mouth daily.     Marland Kitchen docusate sodium (COLACE) 100 MG capsule Take 1 capsule (100 mg total) by mouth 2 (two) times daily. 60 capsule 0  . HYDROmorphone (DILAUDID) 4 MG tablet 4 mg every 2-4 hours prn pain 60 tablet 0  . KLOR-CON M20 20 MEQ tablet TAKE ONE TABLET BY MOUTH ONCE DAILY 30 tablet 10  . lisinopril (PRINIVIL,ZESTRIL) 40 MG tablet Take 1 tablet (40 mg total) by mouth daily. 90  tablet 3  . morphine (MS CONTIN) 30 MG 12 hr tablet Take 1 tablet (30 mg total) by mouth every 8 (eight) hours. 90 tablet 0  . Multiple Vitamin (MULTIVITAMIN) capsule Take 1 capsule by mouth daily.    Marland Kitchen omega-3 acid ethyl esters (LOVAZA) 1 g capsule Take 1 g by mouth daily.    Marland Kitchen ULORIC 40 MG tablet TAKE ONE TABLET BY MOUTH ONCE DAILY 30 tablet 1  . colchicine 0.6 MG tablet Reported on 08/17/2015    . furosemide (LASIX) 20 MG tablet   1  . Turmeric 500 MG CAPS Take 500 mg by mouth daily.      No current facility-administered medications on file prior to visit.     Allergies  Allergen Reactions  . Lipitor [Atorvastatin] Other (See Comments)    Myopathy...severe, with  Myoglobinuria, wheelchair bound for a few days  . Penicillins Anaphylaxis  . Prednisone Anxiety and Other (See Comments)    Extremely high blood pressure after taking medication for first time.    Blood pressure 134/82, pulse (!) 58, weight 193 lb 12.8 oz (87.9 kg).   Assessment/Plan: Hypertension: BP is at goal today. No medication changes for BP today. BMET today to look at potassium and kidney function after increase dose of lisinopril. Once BMET returns we will decide what to do with potassium supplement (Cabometyx can cause hypokalemia ~18% so may still have a small potassium requirement). Will have him follow up in hypertension clinic in 2 months.   Thank you, Lelan Pons. Patterson Hammersmith, [facility]  10/12/2015 9:56 AM

## 2015-10-13 LAB — BASIC METABOLIC PANEL
BUN: 13 mg/dL (ref 7–25)
CALCIUM: 9.4 mg/dL (ref 8.6–10.3)
CHLORIDE: 98 mmol/L (ref 98–110)
CO2: 28 mmol/L (ref 20–31)
Creat: 1.23 mg/dL (ref 0.70–1.25)
Glucose, Bld: 110 mg/dL — ABNORMAL HIGH (ref 65–99)
Potassium: 5.3 mmol/L (ref 3.5–5.3)
SODIUM: 135 mmol/L (ref 135–146)

## 2015-10-15 ENCOUNTER — Ambulatory Visit
Admission: RE | Admit: 2015-10-15 | Discharge: 2015-10-15 | Disposition: A | Discharge status: Home / Self Care | Payer: Medicare Other | Source: Ambulatory Visit | Attending: Radiation Oncology | Admitting: Radiation Oncology

## 2015-10-15 ENCOUNTER — Encounter: Payer: Self-pay | Admitting: Radiation Oncology

## 2015-10-15 ENCOUNTER — Other Ambulatory Visit: Payer: Self-pay | Admitting: Oncology

## 2015-10-15 VITALS — BP 137/81 | HR 67 | Temp 98.6°F | Resp 18 | Ht 68.0 in | Wt 189.8 lb

## 2015-10-15 DIAGNOSIS — Z510 Encounter for antineoplastic radiation therapy: Secondary | ICD-10-CM | POA: Diagnosis not present

## 2015-10-15 DIAGNOSIS — C7951 Secondary malignant neoplasm of bone: Secondary | ICD-10-CM | POA: Diagnosis not present

## 2015-10-15 DIAGNOSIS — C7949 Secondary malignant neoplasm of other parts of nervous system: Secondary | ICD-10-CM | POA: Diagnosis not present

## 2015-10-15 DIAGNOSIS — C649 Malignant neoplasm of unspecified kidney, except renal pelvis: Secondary | ICD-10-CM | POA: Diagnosis not present

## 2015-10-15 MED FILL — *CABOMETYX 60 MG TABLET: 60 | 30 days supply | Qty: 30 | Fill #0

## 2015-10-15 NOTE — Progress Notes (Addendum)
Mr. Meinecke has received  5 SBRT L-spine treatments.  Mr. Stromquist is still having  Nausea taking Zofran.  She states, "he is doing much better"  with the Zofran.   Has a poor appetite most days.  Having fatigue in the afternoon.  Having pain to left lower back taking Morphine and Dilaudid.    One month card given to see Dr. Tammi Klippel. Wt Readings from Last 3 Encounters:  10/15/15 189 lb 12.8 oz (86.1 kg)  10/12/15 193 lb 12.8 oz (87.9 kg)  09/27/15 199 lb 14.4 oz (90.7 kg)   BP 137/81 (BP Location: Left Arm, Patient Position: Sitting, Cuff Size: Normal)   Pulse 67   Temp 98.6 F (37 C) (Oral)   Resp 18   Ht 5\' 8"  (1.727 m)   Wt 189 lb 12.8 oz (86.1 kg)   SpO2 100%   BMI 28.86 kg/m

## 2015-10-15 NOTE — Progress Notes (Signed)
  Radiation Oncology         [id number]   Name: Derek [name] MRN: [id number]   Date: 10/15/2015  DOB: December 08, 1950     Weekly Radiation Therapy Management    ICD-9-CM ICD-10-CM   1. L1 Metastasis 198.3 C79.49     Current Dose: 50 Gy  Planned Dose:  50 Gy  Narrative The patient presents for routine under treatment assessment.  Mr. Espina as received 5 SBRT L-spine treatments. Mr. Coriell is still having nausea taking Zofran. He states "he is doing much better" with Zofran. Has a poor appetite most days. Having fatigue in the afternoon. Having pain to left lower back taking Morphine and Dilaudid. He doesn't take the Morphine every day, just when he needs it.  The patient is without complaint. Set-up films were reviewed. The chart was checked.  Physical Findings  height is 5\' 8"  (1.727 m) and weight is 189 lb 12.8 oz (86.1 kg). His oral temperature is 98.6 F (37 C). His blood pressure is 137/81 and his pulse is 67. His respiration is 18 and oxygen saturation is 100%. . Weight essentially stable.  No significant changes.  Impression The patient is tolerating radiation.  Plan He has completed treatment today. He has been given a one month follow up appointment with Dr. Tammi Klippel.         Sheral Apley Tammi Klippel, M.D.    This document serves as a record of services personally performed by Tyler Pita, MD. It was created on his behalf by Lendon Collar, a trained medical scribe. The creation of this record is based on the scribe's personal observations and the provider's statements to them. This document has been checked and approved by the attending provider.

## 2015-10-16 ENCOUNTER — Other Ambulatory Visit: Payer: Medicare Other

## 2015-10-16 ENCOUNTER — Encounter: Payer: Self-pay | Admitting: *Deleted

## 2015-10-16 ENCOUNTER — Telehealth: Payer: Self-pay | Admitting: *Deleted

## 2015-10-16 ENCOUNTER — Ambulatory Visit: Payer: Medicare Other | Admitting: Oncology

## 2015-10-16 NOTE — Progress Notes (Signed)
Spoke with wife bobbie, patient will be here tomorrow @ 9:00 for lab and 9:30 for office visit with dr Alen Blew.

## 2015-10-16 NOTE — Telephone Encounter (Signed)
Wife bobbie calling to say appt for lab ans  dr Alen Blew was cancelled today d/t patient is nauseated. Has taken an antiemetic. Last rad onc treatment was yesterday. Okay to schedule first available?

## 2015-10-16 NOTE — Telephone Encounter (Signed)
8/16 9:30 MD after labs if he can make it.

## 2015-10-16 NOTE — Telephone Encounter (Signed)
Spoke with wife bobbie, they will be here tomorrow. POF to schedulers for lab and md visit 9:00 and 9:30

## 2015-10-17 ENCOUNTER — Ambulatory Visit (HOSPITAL_BASED_OUTPATIENT_CLINIC_OR_DEPARTMENT_OTHER): Payer: Medicare Other

## 2015-10-17 ENCOUNTER — Telehealth: Payer: Self-pay | Admitting: Oncology

## 2015-10-17 ENCOUNTER — Ambulatory Visit (HOSPITAL_BASED_OUTPATIENT_CLINIC_OR_DEPARTMENT_OTHER): Payer: Medicare Other | Admitting: Oncology

## 2015-10-17 VITALS — BP 120/73 | HR 63 | Temp 98.2°F | Resp 16

## 2015-10-17 VITALS — BP 127/70 | HR 64 | Temp 98.5°F | Resp 18 | Ht 68.0 in | Wt 188.8 lb

## 2015-10-17 DIAGNOSIS — R112 Nausea with vomiting, unspecified: Secondary | ICD-10-CM

## 2015-10-17 DIAGNOSIS — C786 Secondary malignant neoplasm of retroperitoneum and peritoneum: Secondary | ICD-10-CM | POA: Diagnosis not present

## 2015-10-17 DIAGNOSIS — E860 Dehydration: Secondary | ICD-10-CM

## 2015-10-17 DIAGNOSIS — C649 Malignant neoplasm of unspecified kidney, except renal pelvis: Secondary | ICD-10-CM

## 2015-10-17 DIAGNOSIS — C7949 Secondary malignant neoplasm of other parts of nervous system: Secondary | ICD-10-CM

## 2015-10-17 DIAGNOSIS — C787 Secondary malignant neoplasm of liver and intrahepatic bile duct: Secondary | ICD-10-CM

## 2015-10-17 DIAGNOSIS — G893 Neoplasm related pain (acute) (chronic): Secondary | ICD-10-CM

## 2015-10-17 DIAGNOSIS — C641 Malignant neoplasm of right kidney, except renal pelvis: Secondary | ICD-10-CM | POA: Diagnosis not present

## 2015-10-17 DIAGNOSIS — N289 Disorder of kidney and ureter, unspecified: Secondary | ICD-10-CM

## 2015-10-17 LAB — COMPREHENSIVE METABOLIC PANEL
ALBUMIN: 3 g/dL — AB (ref 3.5–5.0)
ALK PHOS: 88 U/L (ref 40–150)
ALT: 59 U/L — AB (ref 0–55)
ANION GAP: 9 meq/L (ref 3–11)
AST: 46 U/L — ABNORMAL HIGH (ref 5–34)
BILIRUBIN TOTAL: 0.63 mg/dL (ref 0.20–1.20)
BUN: 14.2 mg/dL (ref 7.0–26.0)
CALCIUM: 9.2 mg/dL (ref 8.4–10.4)
CO2: 25 mEq/L (ref 22–29)
CREATININE: 1.1 mg/dL (ref 0.7–1.3)
Chloride: 102 mEq/L (ref 98–109)
EGFR: 73 mL/min/{1.73_m2} — ABNORMAL LOW (ref >90)
Glucose: 106 mg/dl (ref 70–140)
Potassium: 4.4 mEq/L (ref 3.5–5.1)
Sodium: 136 mEq/L (ref 136–145)
TOTAL PROTEIN: 6.7 g/dL (ref 6.4–8.3)

## 2015-10-17 LAB — CBC WITH DIFFERENTIAL/PLATELET
BASO%: 0.6 % (ref 0.0–2.0)
Basophils Absolute: 0 10*3/uL (ref 0.0–0.1)
EOS%: 12.7 % — ABNORMAL HIGH (ref 0.0–7.0)
Eosinophils Absolute: 0.4 10*3/uL (ref 0.0–0.5)
HEMATOCRIT: 36.5 % — AB (ref 38.4–49.9)
HEMOGLOBIN: 12.1 g/dL — AB (ref 13.0–17.1)
LYMPH#: 0.4 10*3/uL — AB (ref 0.9–3.3)
LYMPH%: 12.4 % — ABNORMAL LOW (ref 14.0–49.0)
MCH: 29.6 pg (ref 27.2–33.4)
MCHC: 33.2 g/dL (ref 32.0–36.0)
MCV: 89.2 fL (ref 79.3–98.0)
MONO#: 0.3 10*3/uL (ref 0.1–0.9)
MONO%: 9.1 % (ref 0.0–14.0)
NEUT%: 65.2 % (ref 39.0–75.0)
NEUTROS ABS: 2.2 10*3/uL (ref 1.5–6.5)
PLATELETS: 163 10*3/uL (ref 140–400)
RBC: 4.09 10*6/uL — ABNORMAL LOW (ref 4.20–5.82)
RDW: 14.3 % (ref 11.0–14.6)
WBC: 3.3 10*3/uL — ABNORMAL LOW (ref 4.0–10.3)

## 2015-10-17 LAB — UA PROTEIN, DIPSTICK - CHCC: PROTEIN: NEGATIVE mg/dL

## 2015-10-17 MED ORDER — SODIUM CHLORIDE 0.9 % IV SOLN
Freq: Once | INTRAVENOUS | Status: AC
  Administered 2015-10-17: 11:00:00 via INTRAVENOUS
  Filled 2015-10-17: qty 4

## 2015-10-17 MED ORDER — SODIUM CHLORIDE 0.9 % IV SOLN
Freq: Once | INTRAVENOUS | Status: AC
  Administered 2015-10-17: 11:00:00 via INTRAVENOUS

## 2015-10-17 NOTE — Telephone Encounter (Signed)
GAVE PATIENT RELATIVE AVS REPORT AND APPOINTMENTS FOR September. [facility] SCHEDULING WILL CALL RE CT - PATIENT RELATIVE AWARE

## 2015-10-17 NOTE — Patient Instructions (Signed)

## 2015-10-17 NOTE — Progress Notes (Signed)
Hematology and Oncology Follow Up Visit  Derek [name] [id number] May 03, 1950 65 y.o. 10/17/2015 10:14 AM   Principle Diagnosis: 65 year old with renal cell carcinoma. He was diagnosed in August 2016 measuring 15.3 x 10.9 x 3.0 cm in the right kidney. He has retroperitoneal lymphadenopathy and a 2.7 cm liver mass indicating stage IV disease.    Prior Therapy: He is S/P right open radical nephrectomy and retroperitoneal lymph node dissection done on 11/28/2014. He also had partial right hepatectomy.  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 Metastatic lymph node involvement noted in the sampled lymph nodes. He also had involvement of renal cell carcinoma in the liver resection sample. Votrient 800 mg daily started on 05/29/2015. Therapy discontinued in July 2017 because of progression of disease.  Current therapy:  Cabometyx 60 mg daily started in July 2017. Palliative radiation therapy to the lumbar spine completed on 10/15/2015.  Interim History:  Derek [name] presents today for a follow-up visit. Since the last visit, he completed radiation therapy to the lumbar spine with initial significant improvement in his overall health. His pain has improved and he was using less pain medication. He had an excellent weekend with improvement in his mobility and appetite. On Monday he received his last radiation therapy and the last few days have been more difficult for him. He reported more nausea and vomiting and failure to thrive. His pain have also have increased as well.  He reports overall generalized weakness but no neurological deficits. He is taking Cabometyx without significant complications. She denied any diarrhea, hypertension or hand-foot syndrome. His appetite has been poor and have lost some weight but some of it related to radiation at this time.   He does not report any headaches, blurry vision, syncope or seizures. He does not report  any fevers, chills, sweats. He does not report any chest pain, palpitation, orthopnea. He does not report any cough, wheezing or dyspnea on exertion. He does not report any flank pain, or hematochezia. He does not report any frequency, urgency or hesitancy. Does not report any lymphadenopathy or petechiae. Remaining review of systems unremarkable  Medications: I have reviewed the patient's current medications.  Current Outpatient Prescriptions  Medication Sig Dispense Refill  . aspirin EC 81 MG tablet Take 81 mg by mouth daily.    . CABOMETYX 60 MG TABS TAKE 1 TABLET BY MOUTH ONCE DAILY 30 tablet 0  . carvedilol (COREG) 12.5 MG tablet   0  . Cholecalciferol (VITAMIN D-3) 5000 UNITS TABS Take 5,000 Units by mouth daily.     . colchicine 0.6 MG tablet Reported on 08/17/2015    . docusate sodium (COLACE) 100 MG capsule Take 1 capsule (100 mg total) by mouth 2 (two) times daily. 60 capsule 0  . furosemide (LASIX) 20 MG tablet   1  . HYDROmorphone (DILAUDID) 4 MG tablet 4 mg every 2-4 hours prn pain 60 tablet 0  . KLOR-CON M20 20 MEQ tablet TAKE ONE TABLET BY MOUTH ONCE DAILY 30 tablet 10  . lisinopril (PRINIVIL,ZESTRIL) 40 MG tablet Take 1 tablet (40 mg total) by mouth daily. 90 tablet 3  . LORazepam (ATIVAN) 0.5 MG tablet Take 0.5-1 mg by mouth at bedtime.    Marland Kitchen morphine (MS CONTIN) 30 MG 12 hr tablet Take 1 tablet (30 mg total) by mouth every 8 (eight) hours. 90 tablet 0  . Multiple Vitamin (MULTIVITAMIN) capsule Take 1 capsule by mouth daily.    Marland Kitchen  omega-3 acid ethyl esters (LOVAZA) 1 g capsule Take 1 g by mouth daily.    . ondansetron (ZOFRAN) 4 MG tablet Take 4 mg by mouth every 8 (eight) hours as needed for nausea or vomiting.    . Turmeric 500 MG CAPS Take 500 mg by mouth daily.     Marland Kitchen ULORIC 40 MG tablet TAKE ONE TABLET BY MOUTH ONCE DAILY 30 tablet 1   No current facility-administered medications for this visit.      Allergies:  Allergies  Allergen Reactions  . Lipitor [Atorvastatin]  Other (See Comments)    Myopathy...severe, with  Myoglobinuria, wheelchair bound for a few days  . Penicillins Anaphylaxis  . Prednisone Anxiety and Other (See Comments)    Extremely high blood pressure after taking medication for first time.    Past Medical History, Surgical history, Social history, and Family History were reviewed and updated.   Blood pressure 127/70, pulse 64, temperature 98.5 F (36.9 C), temperature source Oral, resp. rate 18, height 5\' 8"  (1.727 m), weight 188 lb 12.8 oz (85.6 kg), SpO2 100 %. ECOG: 2 General appearance: Chronically ill-appearing gentleman appeared without distress. Head: Normocephalic, without obvious abnormality oral mucosa appeared dry. Neck: no adenopathy No thyroid masses. Lymph nodes: Cervical, supraclavicular, and axillary nodes normal. Heart:regular rate and rhythm, S1, S2 normal, no murmur, click, rub or gallop Lung:chest clear, no wheezing, rales, normal symmetric air entry.  Abdomin: soft, non-tender, without masses or organomegaly. No rebound or guarding. EXT:no erythema, induration, or nodules   Lab Results: Lab Results  Component Value Date   WBC 3.3 (L) 10/17/2015   HGB 12.1 (L) 10/17/2015   HCT 36.5 (L) 10/17/2015   MCV 89.2 10/17/2015   PLT 163 10/17/2015     Chemistry      Component Value Date/Time   NA 136 10/17/2015 0915   K 4.4 10/17/2015 0915   CL 98 10/12/2015 1007   CO2 25 10/17/2015 0915   BUN 14.2 10/17/2015 0915   CREATININE 1.1 10/17/2015 0915      Component Value Date/Time   CALCIUM 9.2 10/17/2015 0915   ALKPHOS 88 10/17/2015 0915   AST 46 (H) 10/17/2015 0915   ALT 59 (H) 10/17/2015 0915   BILITOT 0.63 10/17/2015 0915       Impression and Plan:  65 year old gentleman with the following issues:  1. 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 He is status post surgical resection in September 2016 and he is  fully healed at this time.  CT scan on 05/11/2015 showed relapsed disease with a mass in the right nephrectomy bed which is undoubtedly related to kidney cancer.  CT scan on 09/10/2015 show progression of disease and currently receiving second line Cabometyx and have tolerated it after the first month of therapy. The plan is to continue with the same dose and schedule and repeat CT scan in 4 weeks. If he has a good response then we will continue the current therapy. This visit was therapy will be used if he has progression of disease.  2. Renal insufficiency: His creatinine is borderline after nephrectomy and needs to be monitored moving forward.   3. Hypertension: His blood pressure is within normal range.  4. L1 vertebral body involvement with tumor: Status post radiation therapy which was completed on 10/17/2015. Repeat imaging studies will assess his response to therapy. He does not have any neurological deficits on today's examination although he does  have generalized fatigue and tiredness which likely related to therapy.  5. Pain: He is currently on long-acting morphine and Dilaudid. Pain is manageable at this time.  6. Nausea, vomiting and dehydration: He'll receive intravenous hydration as well as antiemetics today.  7. Prognosis: This was discussed with the patient and his wife. He understands he has an incurable malignancy with a poor prognosis given the aggressive nature and the rapid progression of disease. The next CT scan will be very helpful and really painting a better picture of his future. He understands that if his disease progresses again then the likelihood of response to therapy will be very low.  8. Follow-up: Will be in the next 4 weeks after a CT scan.    [facility], MD 8/16/201710:14 AM

## 2015-10-18 ENCOUNTER — Other Ambulatory Visit: Payer: Self-pay | Admitting: Radiation Oncology

## 2015-10-18 NOTE — Progress Notes (Signed)
  Radiation Oncology         [phone]) [id number] ________________________________  Name: Derek [name] MRN: [id number]  Date: 10/15/2015  DOB: Nov 23, 1950  End of Treatment Note  Diagnosis:   65 yo gentleman with stage IV renal cell carcinoma with painful L1 spinal involvement.     Indication for treatment:  Palliative       Radiation treatment dates:   10/05/2015 to 10/15/2015  Site/dose:   The L1 was treated to 50 Gy in 5 fractions at 10 Gy per fraction.   Beams/energy:   SBRT/SRT-VMAT // 10FFF  Narrative: The patient tolerated radiation treatment relatively well.   He reported pain to the left lower back managed with pain medication. He experienced nausea managed with Zofran. He also reported poor appetite and some fatigue.  Plan: The patient has completed radiation treatment. The patient will return to radiation oncology clinic for routine followup in one month. I advised him to call or return sooner if he has any questions or concerns related to his recovery or treatment. ________________________________  Sheral Apley. Tammi Klippel, M.D.   This document serves as a record of services personally performed by Tyler Pita, MD. It was created on his behalf by Arlyce Harman, a trained medical scribe. The creation of this record is based on the scribe's personal observations and the provider's statements to them. This document has been checked and approved by the attending provider.

## 2015-10-19 ENCOUNTER — Encounter: Payer: Self-pay | Admitting: Radiation Oncology

## 2015-10-19 NOTE — Progress Notes (Signed)
Spoke with Barnetta Chapel of [employer]. Clarified form script for zofran 4 mg tid, qty 90 and no refill given by Dr. Tammi Klippel.

## 2015-10-25 ENCOUNTER — Telehealth: Payer: Self-pay | Admitting: Oncology

## 2015-10-25 NOTE — Telephone Encounter (Signed)
Faxed pt records to [facility] in [place]. ID RELEASE [id number]

## 2015-10-29 ENCOUNTER — Encounter: Payer: Self-pay | Admitting: Nurse Practitioner

## 2015-10-29 ENCOUNTER — Ambulatory Visit (HOSPITAL_BASED_OUTPATIENT_CLINIC_OR_DEPARTMENT_OTHER): Payer: Medicare Other

## 2015-10-29 ENCOUNTER — Other Ambulatory Visit: Payer: Self-pay | Admitting: *Deleted

## 2015-10-29 ENCOUNTER — Ambulatory Visit (HOSPITAL_BASED_OUTPATIENT_CLINIC_OR_DEPARTMENT_OTHER): Payer: Medicare Other | Admitting: Nurse Practitioner

## 2015-10-29 ENCOUNTER — Telehealth: Payer: Self-pay | Admitting: *Deleted

## 2015-10-29 VITALS — BP 152/85 | HR 68 | Temp 98.4°F | Resp 16 | Ht 68.0 in | Wt 182.1 lb

## 2015-10-29 DIAGNOSIS — C799 Secondary malignant neoplasm of unspecified site: Secondary | ICD-10-CM

## 2015-10-29 DIAGNOSIS — R197 Diarrhea, unspecified: Secondary | ICD-10-CM | POA: Insufficient documentation

## 2015-10-29 DIAGNOSIS — E860 Dehydration: Secondary | ICD-10-CM | POA: Insufficient documentation

## 2015-10-29 DIAGNOSIS — C641 Malignant neoplasm of right kidney, except renal pelvis: Secondary | ICD-10-CM | POA: Diagnosis not present

## 2015-10-29 DIAGNOSIS — C7951 Secondary malignant neoplasm of bone: Secondary | ICD-10-CM

## 2015-10-29 DIAGNOSIS — C787 Secondary malignant neoplasm of liver and intrahepatic bile duct: Secondary | ICD-10-CM

## 2015-10-29 DIAGNOSIS — C649 Malignant neoplasm of unspecified kidney, except renal pelvis: Secondary | ICD-10-CM

## 2015-10-29 DIAGNOSIS — C7949 Secondary malignant neoplasm of other parts of nervous system: Secondary | ICD-10-CM

## 2015-10-29 LAB — COMPREHENSIVE METABOLIC PANEL
ALBUMIN: 3.2 g/dL — AB (ref 3.5–5.0)
ALK PHOS: 78 U/L (ref 40–150)
ALT: 79 U/L — ABNORMAL HIGH (ref 0–55)
AST: 51 U/L — AB (ref 5–34)
Anion Gap: 10 mEq/L (ref 3–11)
BILIRUBIN TOTAL: 0.65 mg/dL (ref 0.20–1.20)
BUN: 15.4 mg/dL (ref 7.0–26.0)
CALCIUM: 9.4 mg/dL (ref 8.4–10.4)
CO2: 23 mEq/L (ref 22–29)
Chloride: 106 mEq/L (ref 98–109)
Creatinine: 1 mg/dL (ref 0.7–1.3)
EGFR: 74 mL/min/{1.73_m2} — ABNORMAL LOW (ref >90)
GLUCOSE: 116 mg/dL (ref 70–140)
Potassium: 4.3 mEq/L (ref 3.5–5.1)
SODIUM: 139 meq/L (ref 136–145)
TOTAL PROTEIN: 6.8 g/dL (ref 6.4–8.3)

## 2015-10-29 LAB — CBC WITH DIFFERENTIAL/PLATELET
BASO%: 0.3 % (ref 0.0–2.0)
Basophils Absolute: 0 10*3/uL (ref 0.0–0.1)
EOS ABS: 0.4 10*3/uL (ref 0.0–0.5)
EOS%: 11.2 % — ABNORMAL HIGH (ref 0.0–7.0)
HCT: 36.6 % — ABNORMAL LOW (ref 38.4–49.9)
HGB: 12.1 g/dL — ABNORMAL LOW (ref 13.0–17.1)
LYMPH%: 14.9 % (ref 14.0–49.0)
MCH: 29.5 pg (ref 27.2–33.4)
MCHC: 33.1 g/dL (ref 32.0–36.0)
MCV: 89.3 fL (ref 79.3–98.0)
MONO#: 0.4 10*3/uL (ref 0.1–0.9)
MONO%: 9.6 % (ref 0.0–14.0)
NEUT#: 2.4 10*3/uL (ref 1.5–6.5)
NEUT%: 64 % (ref 39.0–75.0)
PLATELETS: 180 10*3/uL (ref 140–400)
RBC: 4.1 10*6/uL — AB (ref 4.20–5.82)
RDW: 15.4 % — ABNORMAL HIGH (ref 11.0–14.6)
WBC: 3.8 10*3/uL — ABNORMAL LOW (ref 4.0–10.3)
lymph#: 0.6 10*3/uL — ABNORMAL LOW (ref 0.9–3.3)

## 2015-10-29 MED ORDER — DIPHENOXYLATE-ATROPINE 2.5-0.025 MG PO TABS: 2.0000 | ORAL_TABLET | Freq: Four times a day (QID) | ORAL | 0 refills | Status: DC | PRN

## 2015-10-29 MED ORDER — SODIUM CHLORIDE 0.9 % IV SOLN: Freq: Once | INTRAVENOUS | Status: DC

## 2015-10-29 NOTE — Assessment & Plan Note (Signed)
Patient reports to the [facility] today with complaint of diarrhea and subsequent dehydration.  He will receive IV fluid rehydration while at the cancer Center today.

## 2015-10-29 NOTE — Progress Notes (Signed)
SYMPTOM MANAGEMENT CLINIC    Chief Complaint: Diarrhea, dehydration  HPI:  Derek [name] 65 y.o. male diagnosed with renal cell carcinoma; with both liver and bone metastasis.  Patient is status post palliative radiation treatment and nephrectomy in the past.  Currently undergoing Cabometyx oral therapy.    No history exists.    Review of Systems  Constitutional: Positive for malaise/fatigue.  Gastrointestinal: Positive for diarrhea.  All other systems reviewed and are negative.   Past Medical History:  Diagnosis Date  . Arthritis   . Bone cancer ([place])    rcc with L1 pathologic compression fracture  . Cancer [facility])    rcc with L1 pathologic compression fx.  . Gout   . Hypertension   . NSTEMI (non-ST elevated myocardial infarction) ([place])   . Renal cell carcinoma ([place]) 11/28/2014  . Renal cell carcinoma ([place])   . Shingles 7/15    Past Surgical History:  Procedure Laterality Date  . CORONARY ARTERY BYPASS GRAFT N/A 01/27/2014   Procedure: CORONARY ARTERY BYPASS GRAFTING (CABG), ON PUMP, TIMES FOUR, USING LEFT INTERNAL MAMMARY ARTERY, RIGHT GREATER SAPHENOUS VEIN HARVESTED ENDOSCOPICALLY;  Surgeon: Gaye Pollack, MD;  Location: [place];  Service: Open Heart Surgery;  Laterality: N/A;  LIMA-LAD; SVG-OM; SVG-PD; SVG-DIAG  . KNEE SURGERY    . LEFT HEART CATHETERIZATION WITH CORONARY ANGIOGRAM N/A 01/23/2014   Procedure: LEFT HEART CATHETERIZATION WITH CORONARY ANGIOGRAM;  Surgeon: Blane Ohara, MD;  Location: [facility] CATH LAB;  Service: Cardiovascular;  Laterality: N/A;  . NEPHRECTOMY Right 11/28/2014   Procedure: RIGHT OPEN NEPHRECTOMY;  Surgeon: Cleon Gustin, MD;  Location: [place];  Service: Urology;  Laterality: Right;  . OPEN PARTIAL HEPATECTOMY  N/A 11/28/2014   Procedure: OPEN PARTIAL HEPATECTOMY;  Surgeon: [name], MD;  Location: [place];  Service: General;  Laterality: N/A;  . TEE WITHOUT CARDIOVERSION N/A 01/27/2014   Procedure: TRANSESOPHAGEAL ECHOCARDIOGRAM (TEE);   Surgeon: Gaye Pollack, MD;  Location: [place];  Service: Open Heart Surgery;  Laterality: N/A;    has LVH (left ventricular hypertrophy) due to hypertensive disease; Systolic and diastolic CHF, acute ([place]); NSTEMI (non-ST elevated myocardial infarction) ([place]); HTN (hypertension); Obesity; Prediabetes; S/P CABG x 4; Gout; Metastatic renal cell carcinoma ([place]); Postoperative anemia due to acute blood loss; Hyperlipidemia; Medication management; L1 Metastasis; Diarrhea; and Dehydration on his problem list.    is allergic to lipitor [atorvastatin]; penicillins; and prednisone.    Medication List       Accurate as of 10/29/15  5:07 PM. Always use your most recent med list.          aspirin EC 81 MG tablet Take 81 mg by mouth daily.   CABOMETYX 60 MG Tabs Generic drug:  cabozantinib S-Malate TAKE 1 TABLET BY MOUTH ONCE DAILY   carvedilol 12.5 MG tablet Commonly known as:  COREG   colchicine 0.6 MG tablet Reported on 08/17/2015   diphenoxylate-atropine 2.5-0.025 MG tablet Commonly known as:  LOMOTIL Take 2 tablets by mouth 4 (four) times daily as needed for diarrhea or loose stools.   docusate sodium 100 MG capsule Commonly known as:  COLACE Take 1 capsule (100 mg total) by mouth 2 (two) times daily.   furosemide 20 MG tablet Commonly known as:  LASIX   HYDROmorphone 4 MG tablet Commonly known as:  DILAUDID 4 mg every 2-4 hours prn pain   KLOR-CON M20 20 MEQ tablet Generic drug:  potassium chloride SA TAKE ONE TABLET BY MOUTH ONCE DAILY  lisinopril 40 MG tablet Commonly known as:  PRINIVIL,ZESTRIL Take 1 tablet (40 mg total) by mouth daily.   LORazepam 0.5 MG tablet Commonly known as:  ATIVAN Take 0.5-1 mg by mouth at bedtime.   morphine 30 MG 12 hr tablet Commonly known as:  MS CONTIN Take 1 tablet (30 mg total) by mouth every 8 (eight) hours.   multivitamin capsule Take 1 capsule by mouth daily.   omega-3 acid ethyl esters 1 g capsule Commonly known as:   LOVAZA Take 1 g by mouth daily.   ondansetron 4 MG tablet Commonly known as:  ZOFRAN TAKE ONE TABLET BY MOUTH THREE TIMES DAILY   Turmeric 500 MG Caps Take 500 mg by mouth daily.   ULORIC 40 MG tablet Generic drug:  febuxostat TAKE ONE TABLET BY MOUTH ONCE DAILY   Vitamin D-3 5000 UNITS Tabs Take 5,000 Units by mouth daily.        PHYSICAL EXAMINATION  Oncology Vitals 10/29/2015 10/17/2015  Height 173 cm -  Weight 82.6 kg -  Weight (lbs) 182 lbs 2 oz -  BMI (kg/m2) 27.69 kg/m2 -  Temp 98.4 98.2  Pulse 68 63  Resp 16 16  SpO2 100 98  BSA (m2) 1.99 m2 -   BP Readings from Last 2 Encounters:  10/29/15 (!) 152/85  10/17/15 120/73    Physical Exam  Constitutional: He is oriented to person, place, and time and well-developed, well-nourished, and in no distress.  HENT:  Head: Normocephalic and atraumatic.  Eyes: Conjunctivae and EOM are normal. Pupils are equal, round, and reactive to light.  Neck: Normal range of motion.  Pulmonary/Chest: Effort normal. No respiratory distress.  Musculoskeletal: Normal range of motion.  Neurological: He is alert and oriented to person, place, and time. Gait normal.  Skin: Skin is warm and dry.  Psychiatric: Affect normal.  Nursing note and vitals reviewed.   LABORATORY DATA:. Appointment on 10/29/2015  Component Date Value Ref Range Status  . WBC 10/29/2015 3.8* 4.0 - 10.3 10e3/uL Final  . NEUT# 10/29/2015 2.4  1.5 - 6.5 10e3/uL Final  . HGB 10/29/2015 12.1* 13.0 - 17.1 g/dL Final  . HCT 10/29/2015 36.6* 38.4 - 49.9 % Final  . Platelets 10/29/2015 180  140 - 400 10e3/uL Final  . MCV 10/29/2015 89.3  79.3 - 98.0 fL Final  . MCH 10/29/2015 29.5  27.2 - 33.4 pg Final  . MCHC 10/29/2015 33.1  32.0 - 36.0 g/dL Final  . RBC 10/29/2015 4.10* 4.20 - 5.82 10e6/uL Final  . RDW 10/29/2015 15.4* 11.0 - 14.6 % Final  . lymph# 10/29/2015 0.6* 0.9 - 3.3 10e3/uL Final  . MONO# 10/29/2015 0.4  0.1 - 0.9 10e3/uL Final  . Eosinophils Absolute  10/29/2015 0.4  0.0 - 0.5 10e3/uL Final  . Basophils Absolute 10/29/2015 0.0  0.0 - 0.1 10e3/uL Final  . NEUT% 10/29/2015 64.0  39.0 - 75.0 % Final  . LYMPH% 10/29/2015 14.9  14.0 - 49.0 % Final  . MONO% 10/29/2015 9.6  0.0 - 14.0 % Final  . EOS% 10/29/2015 11.2* 0.0 - 7.0 % Final  . BASO% 10/29/2015 0.3  0.0 - 2.0 % Final  . Sodium 10/29/2015 139  136 - 145 mEq/L Final  . Potassium 10/29/2015 4.3  3.5 - 5.1 mEq/L Final  . Chloride 10/29/2015 106  98 - 109 mEq/L Final  . CO2 10/29/2015 23  22 - 29 mEq/L Final  . Glucose 10/29/2015 116  70 - 140 mg/dl Final  . BUN  10/29/2015 15.4  7.0 - 26.0 mg/dL Final  . Creatinine 10/29/2015 1.0  0.7 - 1.3 mg/dL Final  . Total Bilirubin 10/29/2015 0.65  0.20 - 1.20 mg/dL Final  . Alkaline Phosphatase 10/29/2015 78  40 - 150 U/L Final  . AST 10/29/2015 51* 5 - 34 U/L Final  . ALT 10/29/2015 79* 0 - 55 U/L Final  . Total Protein 10/29/2015 6.8  6.4 - 8.3 g/dL Final  . Albumin 10/29/2015 3.2* 3.5 - 5.0 g/dL Final  . Calcium 10/29/2015 9.4  8.4 - 10.4 mg/dL Final  . Anion Gap 10/29/2015 10  3 - 11 mEq/L Final  . EGFR 10/29/2015 74* >90 ml/min/1.73 m2 Final    RADIOGRAPHIC STUDIES: No results found.  ASSESSMENT/PLAN:    Metastatic renal cell carcinoma (HCC) Patient continues to take Cabometyx oral therapy on a daily basis as directed.  He presented to the [facility] today with complaint of diarrhea and dehydration.  See further notes for details.  Patient is scheduled to return for labs only on 11/16/2015.  He is scheduled for follow-up visit on 11/20/2015.  Note: Dr. Hazeline Junker most recent note states that patient should undergo a restaging scan prior to that visit that is scheduled for 11/20/2015-but there is no scan scheduled as of yet.  Will follow up with Dr. Hazeline Junker nurse.  Diarrhea Patient continues to take Cabometyx oral therapy on a daily basis as directed.  He presented to the [facility] today with complaint of diarrhea and dehydration.   Patient states that he has been experiencing 46 diarrhea episodes daily for the past week or so.  He has been taking Imodium with only minimal effectiveness.  Patient will receive IV fluid rehydration while at the [facility].  He was also given a prescription for Lomotil to alternate with the Imodium for the diarrhea.  Of note: The Cabometyx oral therapy has a 63-74% risk of diarrhea.  This provider will review all details of today's visit with Dr. Alen Blew first thing in the morning.    Patient was advised to call/return or go directly to the emergency department for any worsening symptoms whatsoever.    Dehydration Patient reports to the [facility] today with complaint of diarrhea and subsequent dehydration.  He will receive IV fluid rehydration while at the cancer Center today.   Patient stated understanding of all instructions; and was in agreement with this plan of care. The patient knows to call the clinic with any problems, questions or concerns.   Total time spent with patient was 25 minutes;  with greater than 75 percent of that time spent in face to face counseling regarding patient's symptoms,  and coordination of care and follow up.  Disclaimer:This dictation was prepared with Dragon/digital dictation along with [employer]. Any transcriptional errors that result from this process are unintentional.  Drue Second, NP 10/29/2015

## 2015-10-29 NOTE — Assessment & Plan Note (Signed)
Patient continues to take Cabometyx oral therapy on a daily basis as directed.  He presented to the [facility] today with complaint of diarrhea and dehydration.  Patient states that he has been experiencing 46 diarrhea episodes daily for the past week or so.  He has been taking Imodium with only minimal effectiveness.  Patient will receive IV fluid rehydration while at the [facility].  He was also given a prescription for Lomotil to alternate with the Imodium for the diarrhea.  Of note: The Cabometyx oral therapy has a 63-74% risk of diarrhea.  This provider will review all details of today's visit with Dr. Alen Blew first thing in the morning.    Patient was advised to call/return or go directly to the emergency department for any worsening symptoms whatsoever.

## 2015-10-29 NOTE — Telephone Encounter (Signed)
Wife bobbie calling to say patient has had diarrhea x 4 days does not want to eat or drink. cynthia bacon NP will see patient after labs today and assess need for I.V. Fluids.

## 2015-10-29 NOTE — Assessment & Plan Note (Signed)
Patient continues to take Cabometyx oral therapy on a daily basis as directed.  He presented to the [place] today with complaint of diarrhea and dehydration.  See further notes for details.  Patient is scheduled to return for labs only on 11/16/2015.  He is scheduled for follow-up visit on 11/20/2015.  Note: Dr. Hazeline Junker most recent note states that patient should undergo a restaging scan prior to that visit that is scheduled for 11/20/2015-but there is no scan scheduled as of yet.  Will follow up with Dr. Hazeline Junker nurse.

## 2015-11-01 ENCOUNTER — Telehealth: Payer: Self-pay | Admitting: Nurse Practitioner

## 2015-11-01 NOTE — Telephone Encounter (Signed)
Spoke with patient's wife.  She said that pt's diarrhea was much improved; but that he was still dehydrated.  She wants to check with him; and then possibly come back to the [facility] for additional IV fluids either today or early tomorrow.   Told wife would await her call back to arrange fluids.

## 2015-11-08 DIAGNOSIS — C787 Secondary malignant neoplasm of liver and intrahepatic bile duct: Secondary | ICD-10-CM | POA: Diagnosis not present

## 2015-11-08 DIAGNOSIS — C641 Malignant neoplasm of right kidney, except renal pelvis: Secondary | ICD-10-CM | POA: Diagnosis not present

## 2015-11-08 DIAGNOSIS — C772 Secondary and unspecified malignant neoplasm of intra-abdominal lymph nodes: Secondary | ICD-10-CM | POA: Diagnosis not present

## 2015-11-09 ENCOUNTER — Other Ambulatory Visit: Payer: Self-pay | Admitting: Oncology

## 2015-11-09 ENCOUNTER — Telehealth: Payer: Self-pay | Admitting: *Deleted

## 2015-11-09 DIAGNOSIS — C641 Malignant neoplasm of right kidney, except renal pelvis: Secondary | ICD-10-CM

## 2015-11-09 DIAGNOSIS — C799 Secondary malignant neoplasm of unspecified site: Principal | ICD-10-CM

## 2015-11-09 NOTE — Telephone Encounter (Signed)
Orders are in

## 2015-11-09 NOTE — Telephone Encounter (Signed)
TC from [facility] inquiring about upcoming CT scan. Pt's wife states he has kidney failure and has had previous scans w/o contrast. Upcoming CT Scan is ordered with contrast.  Order will need to be changed. ASAP so procedure can be scheduled.

## 2015-11-14 ENCOUNTER — Other Ambulatory Visit: Payer: Self-pay | Admitting: *Deleted

## 2015-11-14 ENCOUNTER — Telehealth: Payer: Self-pay | Admitting: Pharmacist

## 2015-11-14 MED ORDER — CABOZANTINIB S-MALATE 60 MG PO TABS: 1.0000 | ORAL_TABLET | Freq: Every day | ORAL | 0 refills | Status: DC

## 2015-11-14 MED FILL — *CABOMETYX 60 MG TABLET: 60 | 30 days supply | Qty: 30 | Fill #0

## 2015-11-14 NOTE — Telephone Encounter (Signed)
Oral Chemotherapy Pharmacist Encounter   I spoke with patient's wife for refill request for Cabometyx. Request will be forwarded to Dr. Hazeline Junker desk RN.  Mrs. Snelson stated that her husband is doing fairly well since last visit for diarrhea. Lomotil is helping to control diarrhea which patient experiences 2-3 x/day. It can be inconvenient as patient does not always make it to bathroom before episode begins.  Wife also stated that patient keeps complaining of episodes of dizziness/somnolence/dream state that fully reverses after 1 minute or so. Wife to check hydration status and BP next time patient has an episode. She will keep [place] potsed about frequency/intensity of these epidoses.  I conformed lab/CT appointment on 9/15 and MD appt on 9/19. Wife knows to call the office with any further questions or concerns. Wife stated understanding and appreciation.  Thank you,  Johny Drilling, PharmD, BCPS 11/14/2015  8:47 AM Oral Chemotherapy Clinic [phone]

## 2015-11-16 ENCOUNTER — Ambulatory Visit (HOSPITAL_COMMUNITY)
Admission: RE | Admit: 2015-11-16 | Discharge: 2015-11-16 | Disposition: A | Discharge status: Home / Self Care | Payer: Medicare Other | Source: Ambulatory Visit | Attending: Oncology | Admitting: Oncology

## 2015-11-16 ENCOUNTER — Other Ambulatory Visit (HOSPITAL_BASED_OUTPATIENT_CLINIC_OR_DEPARTMENT_OTHER): Payer: Medicare Other

## 2015-11-16 ENCOUNTER — Encounter (HOSPITAL_COMMUNITY): Payer: Self-pay

## 2015-11-16 DIAGNOSIS — C641 Malignant neoplasm of right kidney, except renal pelvis: Secondary | ICD-10-CM | POA: Diagnosis not present

## 2015-11-16 DIAGNOSIS — Z905 Acquired absence of kidney: Secondary | ICD-10-CM | POA: Diagnosis not present

## 2015-11-16 DIAGNOSIS — C649 Malignant neoplasm of unspecified kidney, except renal pelvis: Secondary | ICD-10-CM | POA: Diagnosis present

## 2015-11-16 DIAGNOSIS — C7949 Secondary malignant neoplasm of other parts of nervous system: Secondary | ICD-10-CM

## 2015-11-16 DIAGNOSIS — C799 Secondary malignant neoplasm of unspecified site: Secondary | ICD-10-CM

## 2015-11-16 DIAGNOSIS — R918 Other nonspecific abnormal finding of lung field: Secondary | ICD-10-CM | POA: Diagnosis not present

## 2015-11-16 DIAGNOSIS — C7951 Secondary malignant neoplasm of bone: Secondary | ICD-10-CM | POA: Diagnosis not present

## 2015-11-16 LAB — CBC WITH DIFFERENTIAL/PLATELET
BASO%: 0.4 % (ref 0.0–2.0)
Basophils Absolute: 0 10*3/uL (ref 0.0–0.1)
EOS%: 4.6 % (ref 0.0–7.0)
Eosinophils Absolute: 0.2 10*3/uL (ref 0.0–0.5)
HCT: 37.3 % — ABNORMAL LOW (ref 38.4–49.9)
HGB: 12.3 g/dL — ABNORMAL LOW (ref 13.0–17.1)
LYMPH#: 0.4 10*3/uL — AB (ref 0.9–3.3)
LYMPH%: 12.3 % — ABNORMAL LOW (ref 14.0–49.0)
MCH: 29.9 pg (ref 27.2–33.4)
MCHC: 32.9 g/dL (ref 32.0–36.0)
MCV: 91 fL (ref 79.3–98.0)
MONO#: 0.2 10*3/uL (ref 0.1–0.9)
MONO%: 6.5 % (ref 0.0–14.0)
NEUT%: 76.2 % — AB (ref 39.0–75.0)
NEUTROS ABS: 2.7 10*3/uL (ref 1.5–6.5)
Platelets: 215 10*3/uL (ref 140–400)
RBC: 4.1 10*6/uL — AB (ref 4.20–5.82)
RDW: 17.6 % — AB (ref 11.0–14.6)
WBC: 3.5 10*3/uL — AB (ref 4.0–10.3)

## 2015-11-16 LAB — COMPREHENSIVE METABOLIC PANEL
ALK PHOS: 82 U/L (ref 40–150)
ALT: 52 U/L (ref 0–55)
AST: 37 U/L — AB (ref 5–34)
Albumin: 3.1 g/dL — ABNORMAL LOW (ref 3.5–5.0)
Anion Gap: 8 mEq/L (ref 3–11)
BUN: 13.4 mg/dL (ref 7.0–26.0)
CHLORIDE: 104 meq/L (ref 98–109)
CO2: 27 meq/L (ref 22–29)
Calcium: 9.6 mg/dL (ref 8.4–10.4)
Creatinine: 1 mg/dL (ref 0.7–1.3)
EGFR: 75 mL/min/{1.73_m2} — AB (ref >90)
GLUCOSE: 99 mg/dL (ref 70–140)
POTASSIUM: 5 meq/L (ref 3.5–5.1)
SODIUM: 139 meq/L (ref 136–145)
Total Bilirubin: 0.71 mg/dL (ref 0.20–1.20)
Total Protein: 7.1 g/dL (ref 6.4–8.3)

## 2015-11-16 LAB — UA PROTEIN, DIPSTICK - CHCC: Protein, ur: <30 mg/dL

## 2015-11-20 ENCOUNTER — Ambulatory Visit (HOSPITAL_BASED_OUTPATIENT_CLINIC_OR_DEPARTMENT_OTHER): Payer: Medicare Other | Admitting: Oncology

## 2015-11-20 ENCOUNTER — Telehealth: Payer: Self-pay | Admitting: Oncology

## 2015-11-20 ENCOUNTER — Other Ambulatory Visit: Payer: Self-pay | Admitting: Family Medicine

## 2015-11-20 ENCOUNTER — Ambulatory Visit
Admission: RE | Admit: 2015-11-20 | Discharge: 2015-11-20 | Disposition: A | Discharge status: Home / Self Care | Payer: Medicare Other | Source: Ambulatory Visit | Attending: Radiation Oncology | Admitting: Radiation Oncology

## 2015-11-20 ENCOUNTER — Encounter: Payer: Self-pay | Admitting: Radiation Oncology

## 2015-11-20 VITALS — BP 128/80 | HR 72 | Temp 98.2°F | Resp 18 | Ht 68.0 in | Wt 179.4 lb

## 2015-11-20 DIAGNOSIS — C786 Secondary malignant neoplasm of retroperitoneum and peritoneum: Secondary | ICD-10-CM

## 2015-11-20 DIAGNOSIS — C7951 Secondary malignant neoplasm of bone: Secondary | ICD-10-CM | POA: Insufficient documentation

## 2015-11-20 DIAGNOSIS — C799 Secondary malignant neoplasm of unspecified site: Principal | ICD-10-CM

## 2015-11-20 DIAGNOSIS — G893 Neoplasm related pain (acute) (chronic): Secondary | ICD-10-CM | POA: Diagnosis not present

## 2015-11-20 DIAGNOSIS — Z880 Allergy status to penicillin: Secondary | ICD-10-CM | POA: Diagnosis not present

## 2015-11-20 DIAGNOSIS — C649 Malignant neoplasm of unspecified kidney, except renal pelvis: Secondary | ICD-10-CM | POA: Diagnosis not present

## 2015-11-20 DIAGNOSIS — M549 Dorsalgia, unspecified: Secondary | ICD-10-CM | POA: Insufficient documentation

## 2015-11-20 DIAGNOSIS — Z7982 Long term (current) use of aspirin: Secondary | ICD-10-CM | POA: Diagnosis not present

## 2015-11-20 DIAGNOSIS — Z79899 Other long term (current) drug therapy: Secondary | ICD-10-CM | POA: Insufficient documentation

## 2015-11-20 DIAGNOSIS — C7949 Secondary malignant neoplasm of other parts of nervous system: Secondary | ICD-10-CM

## 2015-11-20 DIAGNOSIS — C641 Malignant neoplasm of right kidney, except renal pelvis: Secondary | ICD-10-CM

## 2015-11-20 DIAGNOSIS — C787 Secondary malignant neoplasm of liver and intrahepatic bile duct: Secondary | ICD-10-CM

## 2015-11-20 DIAGNOSIS — R197 Diarrhea, unspecified: Secondary | ICD-10-CM | POA: Diagnosis not present

## 2015-11-20 NOTE — Progress Notes (Signed)
Radiation Oncology         [phone]) [phone] ________________________________  Name: Derek [name] MRN: [id number]  Date: 11/20/2015  DOB: November 10, 1950  Post Treatment Note  CC: Saguier, Jeralyn Bennett, MD  Diagnosis:   65 y.o. 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  Interval Since Last Radiation:  4 weeks   10/05/2015 to 10/15/2015: The L1 was treated to 50 Gy in 5 fractions at 10 Gy per fraction.   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 He tolerated radiotherapy well. He did have difficulty with back pain initially which required long and short acting narcotics for relief. He has been able to deescalate his pain medication and is taking oxycodone prn rather than scheduled.  He and his wife are planning to go to [place] to have a second opinion with a renal cell carcinoma specialist at [facility]. He plans to see Dr. Alen [name] also this afternoon.                          On review of systems, the patient states he is feeling much better, his pain is now at a level 3/10. He is still having some stiffness of his joints when waking in the morning, but this seems to resolve with movement. He denies any weakness in his extremities or along his chest wall. He requests disability papers be completed as well. No other complaints are noted.  ALLERGIES:  is allergic to lipitor [atorvastatin]; penicillins; and prednisone.  Meds: Current Outpatient Prescriptions  Medication Sig Dispense Refill  . aspirin EC 81 MG tablet Take 81 mg by mouth daily.    . cabozantinib S-Malate (CABOMETYX) 60 MG TABS Take 1 tablet by mouth daily. 30 tablet 0  . carvedilol (COREG) 12.5 MG tablet   0  . Cholecalciferol (VITAMIN D-3) 5000 UNITS TABS Take 5,000 Units by mouth daily.     . colchicine 0.6 MG tablet Reported on 08/17/2015    . diphenoxylate-atropine (LOMOTIL) 2.5-0.025 MG tablet Take 2 tablets by mouth 4 (four) times daily as needed for diarrhea or  loose stools. 30 tablet 0  . docusate sodium (COLACE) 100 MG capsule Take 1 capsule (100 mg total) by mouth 2 (two) times daily. 60 capsule 0  . KLOR-CON M20 20 MEQ tablet TAKE ONE TABLET BY MOUTH ONCE DAILY 30 tablet 10  . lisinopril (PRINIVIL,ZESTRIL) 40 MG tablet Take 1 tablet (40 mg total) by mouth daily. 90 tablet 3  . LORazepam (ATIVAN) 0.5 MG tablet Take 0.5-1 mg by mouth at bedtime.    Marland Kitchen morphine (MS CONTIN) 30 MG 12 hr tablet Take 1 tablet (30 mg total) by mouth every 8 (eight) hours. 90 tablet 0  . Multiple Vitamin (MULTIVITAMIN) capsule Take 1 capsule by mouth daily.    Marland Kitchen omega-3 acid ethyl esters (LOVAZA) 1 g capsule Take 1 g by mouth daily.    . Turmeric 500 MG CAPS Take 500 mg by mouth daily.     Marland Kitchen ULORIC 40 MG tablet TAKE ONE TABLET BY MOUTH ONCE DAILY 30 tablet 1  . furosemide (LASIX) 20 MG tablet   1  . HYDROmorphone (DILAUDID) 4 MG tablet 4 mg every 2-4 hours prn pain (Patient not taking: Reported on 11/20/2015) 60 tablet 0  . ondansetron (ZOFRAN) 4 MG tablet TAKE ONE TABLET BY MOUTH THREE TIMES DAILY (Patient not taking: Reported on 11/20/2015) 20 tablet 0   No  current facility-administered medications for this encounter.     Physical Findings:  height is 5\' 8"  (1.727 m) and weight is 180 lb (81.6 kg). His oral temperature is 98.8 F (37.1 C). His blood pressure is 136/73 and his pulse is 67. His respiration is 18 and oxygen saturation is 100%.  In general this is a well appearing caucasian male in no acute distress. He's alert and oriented x4 and appropriate throughout the examination. Cardiopulmonary assessment is negative for acute distress and he exhibits normal effort. The anterior and posterior thorax are without desquamation in the location of the treatment field. His markings from treatment are still in place on the anterior chest.  Lab Findings: Lab Results  Component Value Date   WBC 3.5 (L) 11/16/2015   HGB 12.3 (L) 11/16/2015   HCT 37.3 (L) 11/16/2015   MCV  91.0 11/16/2015   PLT 215 11/16/2015     Radiographic Findings: Ct Abdomen Pelvis Wo Contrast  Result Date: 11/16/2015 CLINICAL DATA:  Metastatic right renal cell carcinoma EXAM: CT CHEST, ABDOMEN AND PELVIS WITHOUT CONTRAST TECHNIQUE: Multidetector CT imaging of the chest, abdomen and pelvis was performed following the standard protocol without IV contrast. COMPARISON:  MRI lumbar spine dated 09/14/2015. CT abdomen/ pelvis dated 09/10/2015. CT chest abdomen pelvis dated 08/21/2015. FINDINGS: CT CHEST FINDINGS Cardiovascular: Heart is top-normal in size. No pericardial effusion. Three vessel coronary atherosclerosis. Atherosclerotic calcifications of the aortic arch. Mediastinum/Nodes: Small mediastinal lymph nodes, including an 8 mm short axis low right paratracheal node (series 2/ image 25), previously 12 mm. No suspicious hilar adenopathy. Visualized thyroid is unremarkable. Lungs/Pleura: 4 mm right upper lobe pulmonary nodule (series 4/ image 56), previously 7 mm. Adjacent nodule has resolved. Additional 3 mm nodule along the right minor fissure and 4 mm nodule in the posterior right upper lobe (series 4/image 74), unchanged. No focal consolidation. No pleural effusion or pneumothorax. Musculoskeletal: Degenerative changes of the thoracic spine. Median sternotomy. CT ABDOMEN PELVIS FINDINGS Hepatobiliary: 1.3 cm hyperdense lesion in the right hepatic dome (series 2/ image 48), unchanged. Additional subcentimeter cyst in the posterior right hepatic lobe (series 2/ image 48). Abnormal soft tissue along the interface between the inferior right liver margin and the hepatic flexure of the colon (series 2/image 64), tumor involvement not excluded. Postsurgical changes in the right hepatic lobe (series 2/ image 53). Layering gallstones (series 2/image 60), without associated inflammatory changes. No intrahepatic or extrahepatic ductal dilatation. Pancreas: Within normal limits. Spleen: Within normal limits.  Adrenals/Urinary Tract: 1.1 cm right adrenal nodule (series 2/ image 61), previously 1.2 cm. Left adrenal gland is within normal limits. Status post right nephrectomy. Left kidney is notable for a 6.5 cm medial left upper pole renal cyst (series 2/image 70). No hydronephrosis. Bladder is within normal limits. Stomach/Bowel: Stomach is within normal limits. No evidence of bowel obstruction. Appendix is not discretely visualized. Wall thickening involving the hepatic flexure of the colon (series 2/ image 67), unchanged. Vascular/Lymphatic: Atherosclerotic calcifications of the abdominal aorta and branch vessels. No evidence of abdominal aortic aneurysm. No suspicious abdominopelvic lymphadenopathy. Reproductive: Prostate is grossly unremarkable. Other: No abdominopelvic ascites. Fat within the right inguinal canal. Musculoskeletal: Right psoas muscle remains mildly asymmetrically enlarged relative to the left (series 2/image 66), although significantly improved from the prior, now measuring 2.1 cm in thickness, previously 3.9 cm. Lytic lesion involving the L1 vertebral body and extending into the posterior elements (see sagittal image 87), better evaluated on prior lumbar spine MRI. Associated mild to moderate superior  endplate compression fracture deformity, progressed. Lytic lesion involving the superior endplate of the L2 vertebral body (sagittal image 85), without associated pathologic fracture. This is new from prior CT and increased from prior MRI. IMPRESSION: Status post right nephrectomy. Decreased enlargement of the right psoas muscle, suggesting improving metastasis. Adjacent wall thickening involving the hepatic flexure of the colon, likely reflecting local invasion, grossly unchanged. Tumor involvement of the right liver margin is difficult to exclude. 11 mm right adrenal nodule, grossly unchanged, metastasis not excluded. Improving small right lung nodules and mediastinal lymph nodes, indeterminate. Mild  to moderate superior endplate pathologic compression fracture deformity at L1, progressed. Lytic osseous metastasis at L2, progressed. Electronically Signed   By: Julian Hy M.D.   On: 11/16/2015 16:29   Ct Chest Wo Contrast  Result Date: 11/16/2015 CLINICAL DATA:  Metastatic right renal cell carcinoma EXAM: CT CHEST, ABDOMEN AND PELVIS WITHOUT CONTRAST TECHNIQUE: Multidetector CT imaging of the chest, abdomen and pelvis was performed following the standard protocol without IV contrast. COMPARISON:  MRI lumbar spine dated 09/14/2015. CT abdomen/ pelvis dated 09/10/2015. CT chest abdomen pelvis dated 08/21/2015. FINDINGS: CT CHEST FINDINGS Cardiovascular: Heart is top-normal in size. No pericardial effusion. Three vessel coronary atherosclerosis. Atherosclerotic calcifications of the aortic arch. Mediastinum/Nodes: Small mediastinal lymph nodes, including an 8 mm short axis low right paratracheal node (series 2/ image 25), previously 12 mm. No suspicious hilar adenopathy. Visualized thyroid is unremarkable. Lungs/Pleura: 4 mm right upper lobe pulmonary nodule (series 4/ image 56), previously 7 mm. Adjacent nodule has resolved. Additional 3 mm nodule along the right minor fissure and 4 mm nodule in the posterior right upper lobe (series 4/image 74), unchanged. No focal consolidation. No pleural effusion or pneumothorax. Musculoskeletal: Degenerative changes of the thoracic spine. Median sternotomy. CT ABDOMEN PELVIS FINDINGS Hepatobiliary: 1.3 cm hyperdense lesion in the right hepatic dome (series 2/ image 48), unchanged. Additional subcentimeter cyst in the posterior right hepatic lobe (series 2/ image 48). Abnormal soft tissue along the interface between the inferior right liver margin and the hepatic flexure of the colon (series 2/image 64), tumor involvement not excluded. Postsurgical changes in the right hepatic lobe (series 2/ image 53). Layering gallstones (series 2/image 60), without associated  inflammatory changes. No intrahepatic or extrahepatic ductal dilatation. Pancreas: Within normal limits. Spleen: Within normal limits. Adrenals/Urinary Tract: 1.1 cm right adrenal nodule (series 2/ image 61), previously 1.2 cm. Left adrenal gland is within normal limits. Status post right nephrectomy. Left kidney is notable for a 6.5 cm medial left upper pole renal cyst (series 2/image 70). No hydronephrosis. Bladder is within normal limits. Stomach/Bowel: Stomach is within normal limits. No evidence of bowel obstruction. Appendix is not discretely visualized. Wall thickening involving the hepatic flexure of the colon (series 2/ image 67), unchanged. Vascular/Lymphatic: Atherosclerotic calcifications of the abdominal aorta and branch vessels. No evidence of abdominal aortic aneurysm. No suspicious abdominopelvic lymphadenopathy. Reproductive: Prostate is grossly unremarkable. Other: No abdominopelvic ascites. Fat within the right inguinal canal. Musculoskeletal: Right psoas muscle remains mildly asymmetrically enlarged relative to the left (series 2/image 66), although significantly improved from the prior, now measuring 2.1 cm in thickness, previously 3.9 cm. Lytic lesion involving the L1 vertebral body and extending into the posterior elements (see sagittal image 87), better evaluated on prior lumbar spine MRI. Associated mild to moderate superior endplate compression fracture deformity, progressed. Lytic lesion involving the superior endplate of the L2 vertebral body (sagittal image 85), without associated pathologic fracture. This is new from prior CT and  increased from prior MRI. IMPRESSION: Status post right nephrectomy. Decreased enlargement of the right psoas muscle, suggesting improving metastasis. Adjacent wall thickening involving the hepatic flexure of the colon, likely reflecting local invasion, grossly unchanged. Tumor involvement of the right liver margin is difficult to exclude. 11 mm right adrenal  nodule, grossly unchanged, metastasis not excluded. Improving small right lung nodules and mediastinal lymph nodes, indeterminate. Mild to moderate superior endplate pathologic compression fracture deformity at L1, progressed. Lytic osseous metastasis at L2, progressed. Electronically Signed   By: Julian Hy M.D.   On: 11/16/2015 16:29    Impression/Plan: 1. 65 y.o. 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 The patient has recovered from his radiotherapy treatments. We would be happy to see him back if he has questions or concerns regarding his previous treatment. He will continue with medical oncology as well as seek a second opinion as outlined above. We will complete his disability forms and send these in as well as give him a copy.  2. Back pain. He was encouraged to only take short acting narcotics as these seem to alleviate his pain without the need for multiple dosing or long acting agents.      Carola Rhine, PAC

## 2015-11-20 NOTE — Progress Notes (Signed)
Hematology and Oncology Follow Up Visit  Derek [name] [id number] 1950-05-02 65 y.o. 11/20/2015 3:57 PM   Principle Diagnosis: 65 year old with renal cell carcinoma. He was diagnosed in August 2016 measuring 15.3 x 10.9 x 3.0 cm in the right kidney. He has retroperitoneal lymphadenopathy and a 2.7 cm liver mass indicating stage IV disease.    Prior Therapy: He is S/P right open radical nephrectomy and retroperitoneal lymph node dissection done on 11/28/2014. He also had partial right hepatectomy.  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 Metastatic lymph node involvement noted in the sampled lymph nodes. He also had involvement of renal cell carcinoma in the liver resection sample. Votrient 800 mg daily started on 05/29/2015. Therapy discontinued in July 2017 because of progression of disease. Palliative radiation therapy to the lumbar spine completed on 10/15/2015.   Current therapy:  Cabometyx 60 mg daily started in July 2017.   Interim History:  Derek [name] presents today for a follow-up visit. Since the last visit, he reports improvement in his overall health. His pain and his spine has improved dramatically and has stopped taking long-acting morphine. He does use Dilaudid as needed. He continues to [employer] without significant complications. He did report periodic diarrhea that is controlled by Lomotil. He did have one episode where he required intravenous hydration but no hospitalization.  His performance status and mobility have improved since the last visit. His appetite have been reasonable as well and his weight is relatively stable. He does not report any headaches or neurological deficits.   He does not report any headaches, blurry vision, syncope or seizures. He does not report any fevers, chills, sweats. He does not report any chest pain, palpitation, orthopnea. He does not report any cough, wheezing or dyspnea on  exertion. He does not report any flank pain, or hematochezia. He does not report any frequency, urgency or hesitancy. Does not report any lymphadenopathy or petechiae. Remaining review of systems unremarkable  Medications: I have reviewed the patient's current medications.  Current Outpatient Prescriptions  Medication Sig Dispense Refill  . aspirin EC 81 MG tablet Take 81 mg by mouth daily.    . cabozantinib S-Malate (CABOMETYX) 60 MG TABS Take 1 tablet by mouth daily. 30 tablet 0  . carvedilol (COREG) 12.5 MG tablet   0  . Cholecalciferol (VITAMIN D-3) 5000 UNITS TABS Take 5,000 Units by mouth daily.     . colchicine 0.6 MG tablet Reported on 08/17/2015    . diphenoxylate-atropine (LOMOTIL) 2.5-0.025 MG tablet Take 2 tablets by mouth 4 (four) times daily as needed for diarrhea or loose stools. 30 tablet 0  . docusate sodium (COLACE) 100 MG capsule Take 1 capsule (100 mg total) by mouth 2 (two) times daily. 60 capsule 0  . furosemide (LASIX) 20 MG tablet   1  . HYDROmorphone (DILAUDID) 4 MG tablet 4 mg every 2-4 hours prn pain (Patient not taking: Reported on 11/20/2015) 60 tablet 0  . KLOR-CON M20 20 MEQ tablet TAKE ONE TABLET BY MOUTH ONCE DAILY 30 tablet 10  . lisinopril (PRINIVIL,ZESTRIL) 40 MG tablet Take 1 tablet (40 mg total) by mouth daily. 90 tablet 3  . LORazepam (ATIVAN) 0.5 MG tablet Take 0.5-1 mg by mouth at bedtime.    Marland Kitchen morphine (MS CONTIN) 30 MG 12 hr tablet Take 1 tablet (30 mg total) by mouth every 8 (eight) hours. 90 tablet 0  . Multiple Vitamin (MULTIVITAMIN) capsule Take 1 capsule  by mouth daily.    Marland Kitchen omega-3 acid ethyl esters (LOVAZA) 1 g capsule Take 1 g by mouth daily.    . ondansetron (ZOFRAN) 4 MG tablet TAKE ONE TABLET BY MOUTH THREE TIMES DAILY (Patient not taking: Reported on 11/20/2015) 20 tablet 0  . Turmeric 500 MG CAPS Take 500 mg by mouth daily.     Marland Kitchen ULORIC 40 MG tablet TAKE ONE TABLET BY MOUTH ONCE DAILY 30 tablet 1   No current facility-administered  medications for this visit.      Allergies:  Allergies  Allergen Reactions  . Lipitor [Atorvastatin] Other (See Comments)    Myopathy...severe, with  Myoglobinuria, wheelchair bound for a few days  . Penicillins Anaphylaxis  . Prednisone Anxiety and Other (See Comments)    Extremely high blood pressure after taking medication for first time.    Past Medical History, Surgical history, Social history, and Family History were reviewed and updated.   Blood pressure 128/80, pulse 72, temperature 98.2 F (36.8 C), temperature source Oral, resp. rate 18, height 5\' 8"  (1.727 m), weight 179 lb 6.4 oz (81.4 kg), SpO2 100 %. ECOG: 1 General appearance: Chronically ill-appearing gentleman appeared without distress today. Head: Normocephalic, without obvious abnormality oral mucosa appeared nice and moist. Neck: no adenopathy No thyroid masses. Lymph nodes: Cervical, supraclavicular, and axillary nodes normal. Heart:regular rate and rhythm, S1, S2 normal, no murmur, click, rub or gallop Lung:chest clear, no wheezing, rales, normal symmetric air entry.  Abdomin: soft, non-tender, without masses or organomegaly. No shifting dullness or ascites. EXT:no erythema, induration, or nodules   Lab Results: Lab Results  Component Value Date   WBC 3.5 (L) 11/16/2015   HGB 12.3 (L) 11/16/2015   HCT 37.3 (L) 11/16/2015   MCV 91.0 11/16/2015   PLT 215 11/16/2015     Chemistry      Component Value Date/Time   NA 139 11/16/2015 1102   K 5.0 11/16/2015 1102   CL 98 10/12/2015 1007   CO2 27 11/16/2015 1102   BUN 13.4 11/16/2015 1102   CREATININE 1.0 11/16/2015 1102      Component Value Date/Time   CALCIUM 9.6 11/16/2015 1102   ALKPHOS 82 11/16/2015 1102   AST 37 (H) 11/16/2015 1102   ALT 52 11/16/2015 1102   BILITOT 0.71 11/16/2015 1102     EXAM: CT CHEST, ABDOMEN AND PELVIS WITHOUT CONTRAST  TECHNIQUE: Multidetector CT imaging of the chest, abdomen and pelvis was performed following  the standard protocol without IV contrast.  COMPARISON:  MRI lumbar spine dated 09/14/2015. CT abdomen/ pelvis dated 09/10/2015. CT chest abdomen pelvis dated 08/21/2015.  FINDINGS: CT CHEST FINDINGS  Cardiovascular: Heart is top-normal in size. No pericardial effusion.  Three vessel coronary atherosclerosis.  Atherosclerotic calcifications of the aortic arch.  Mediastinum/Nodes: Small mediastinal lymph nodes, including an 8 mm short axis low right paratracheal node (series 2/ image 25), previously 12 mm.  No suspicious hilar adenopathy.  Visualized thyroid is unremarkable.  Lungs/Pleura: 4 mm right upper lobe pulmonary nodule (series 4/ image 56), previously 7 mm. Adjacent nodule has resolved.  Additional 3 mm nodule along the right minor fissure and 4 mm nodule in the posterior right upper lobe (series 4/image 74), unchanged.  No focal consolidation.  No pleural effusion or pneumothorax.  Musculoskeletal: Degenerative changes of the thoracic spine.  Median sternotomy.  CT ABDOMEN PELVIS FINDINGS  Hepatobiliary: 1.3 cm hyperdense lesion in the right hepatic dome (series 2/ image 48), unchanged. Additional subcentimeter cyst in the posterior  right hepatic lobe (series 2/ image 48).  Abnormal soft tissue along the interface between the inferior right liver margin and the hepatic flexure of the colon (series 2/image 64), tumor involvement not excluded.  Postsurgical changes in the right hepatic lobe (series 2/ image 53).  Layering gallstones (series 2/image 60), without associated inflammatory changes. No intrahepatic or extrahepatic ductal dilatation.  Pancreas: Within normal limits.  Spleen: Within normal limits.  Adrenals/Urinary Tract: 1.1 cm right adrenal nodule (series 2/ image 61), previously 1.2 cm. Left adrenal gland is within normal limits.  Status post right nephrectomy.  Left kidney is notable for a 6.5 cm medial left  upper pole renal cyst (series 2/image 70). No hydronephrosis.  Bladder is within normal limits.  Stomach/Bowel: Stomach is within normal limits.  No evidence of bowel obstruction.  Appendix is not discretely visualized.  Wall thickening involving the hepatic flexure of the colon (series 2/ image 67), unchanged.  Vascular/Lymphatic: Atherosclerotic calcifications of the abdominal aorta and branch vessels.  No evidence of abdominal aortic aneurysm.  No suspicious abdominopelvic lymphadenopathy.  Reproductive: Prostate is grossly unremarkable.  Other: No abdominopelvic ascites.  Fat within the right inguinal canal.  Musculoskeletal: Right psoas muscle remains mildly asymmetrically enlarged relative to the left (series 2/image 66), although significantly improved from the prior, now measuring 2.1 cm in thickness, previously 3.9 cm.  Lytic lesion involving the L1 vertebral body and extending into the posterior elements (see sagittal image 87), better evaluated on prior lumbar spine MRI. Associated mild to moderate superior endplate compression fracture deformity, progressed.  Lytic lesion involving the superior endplate of the L2 vertebral body (sagittal image 85), without associated pathologic fracture. This is new from prior CT and increased from prior MRI.  IMPRESSION: Status post right nephrectomy.  Decreased enlargement of the right psoas muscle, suggesting improving metastasis. Adjacent wall thickening involving the hepatic flexure of the colon, likely reflecting local invasion, grossly unchanged. Tumor involvement of the right liver margin is difficult to exclude.  11 mm right adrenal nodule, grossly unchanged, metastasis not excluded.  Improving small right lung nodules and mediastinal lymph nodes, indeterminate.  Mild to moderate superior endplate pathologic compression fracture deformity at L1, progressed. Lytic osseous metastasis at  L2, progressed.    Impression and Plan:  65 year old gentleman with the following issues:  1. 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 He is status post surgical resection in September 2016 and he is fully healed at this time.  CT scan on 05/11/2015 showed relapsed disease with a mass in the right nephrectomy bed which is undoubtedly related to kidney cancer.  CT scan on 09/10/2015 show progression of disease and currently receiving second line Cabometyx. He does have diarrhea related to this medication but currently manageable. The plan is to continue with the same dose and schedule.  CT scan on 11/16/2015 was reviewed and showed positive response to therapy with decrease in his psoas muscle metastasis. No new areas of metastasis noted at this time.    2. Renal insufficiency: His creatinine is within normal range and currently her baseline.  3. Hypertension: His blood pressure is within normal range. We'll continue to monitor him on this current medication case he develops hypertension.  4. L1 vertebral body involvement with tumor: Status post radiation therapy which was completed on 10/17/2015. His pain is much improved at this time.  5. Pain: He is rarely using long-acting morphine and currently utilizing as  needed.allotted.  6. Diarrhea: Clear instructions how to manage this with Lomotil and Imodium given to the patient. Seems to be manageable at this time. He reports most days he has 2 loose bowel movements. And at times slightly more.  7. Prognosis: This was discussed with the patient and his wife. He understands he has an incurable malignancy with a poor prognosis given the aggressive nature and the rapid progression of disease. His performance status remains excellent and aggressive therapy is desired and continued at this time.  8. Follow-up: Will be in the next 4 weeks.      [name], MD 9/19/20173:57 PM

## 2015-11-20 NOTE — Progress Notes (Signed)
Derek [name] is here for a one month follow up visit for renal cell carcinoma; with both liver and bone metastasis.  Patient is status post palliative radiation treatment and nephrectomy in the past.  Currently undergoing Cabometyx oral therapy.  Still having back pain 3/10 taking MS Contin and Dilaudid.  Bowel pattern has been loose took Lomotil and it helped.  Morning are hard to begin with  His mobility because he is stiff from sleeping during the night.  Uses a walker to ambulate first thing in the morning. Wt Readings from Last 3 Encounters:  11/20/15 180 lb (81.6 kg)  10/29/15 182 lb 1.6 oz (82.6 kg)  10/17/15 188 lb 12.8 oz (85.6 kg)  BP 136/73 (BP Location: Left Arm, Patient Position: Sitting, Cuff Size: Normal)   Pulse 67   Temp 98.8 F (37.1 C) (Oral)   Resp 18   Ht 5\' 8"  (1.727 m)   Wt 180 lb (81.6 kg)   SpO2 100%   BMI 27.37 kg/m

## 2015-11-20 NOTE — Telephone Encounter (Signed)
Avs report and appt schd given per 11/20/15 los. °

## 2015-11-21 ENCOUNTER — Telehealth: Payer: Self-pay | Admitting: Radiation Oncology

## 2015-11-21 ENCOUNTER — Encounter (HOSPITAL_COMMUNITY): Payer: Self-pay

## 2015-11-21 ENCOUNTER — Encounter: Payer: Self-pay | Admitting: Radiation Oncology

## 2015-11-21 NOTE — Telephone Encounter (Signed)
[employer] ATTENDING PHYSICIAN STATEMENT complete, signed and faxed. Fax confirmation of delivery obtained. Provided paperwork to Javata to scan into the patient's chart. Also, requested she mail a copy of paperwork to patient.

## 2015-11-21 NOTE — Progress Notes (Signed)
PAPERWORK RECEIVED, FAXED, AND COPY GIVEN TO PATIENT 9/20

## 2015-11-22 ENCOUNTER — Encounter: Payer: Self-pay | Admitting: Radiation Oncology

## 2015-11-23 ENCOUNTER — Telehealth: Payer: Self-pay

## 2015-11-23 NOTE — Telephone Encounter (Signed)
Faxed disability paperwork to [employer]

## 2015-12-01 ENCOUNTER — Telehealth: Payer: Self-pay

## 2015-12-01 NOTE — Telephone Encounter (Signed)
Faxed attending physicians statement 11/23/15

## 2015-12-06 ENCOUNTER — Telehealth: Payer: Self-pay | Admitting: *Deleted

## 2015-12-06 ENCOUNTER — Ambulatory Visit (HOSPITAL_BASED_OUTPATIENT_CLINIC_OR_DEPARTMENT_OTHER): Payer: Medicare Other | Admitting: Nurse Practitioner

## 2015-12-06 ENCOUNTER — Other Ambulatory Visit: Payer: Self-pay | Admitting: Nurse Practitioner

## 2015-12-06 ENCOUNTER — Ambulatory Visit (HOSPITAL_BASED_OUTPATIENT_CLINIC_OR_DEPARTMENT_OTHER): Payer: Medicare Other

## 2015-12-06 ENCOUNTER — Encounter: Payer: Self-pay | Admitting: Nurse Practitioner

## 2015-12-06 VITALS — BP 151/78 | HR 64 | Temp 97.9°F | Resp 18 | Ht 68.0 in | Wt 173.1 lb

## 2015-12-06 DIAGNOSIS — E860 Dehydration: Secondary | ICD-10-CM | POA: Diagnosis not present

## 2015-12-06 DIAGNOSIS — C799 Secondary malignant neoplasm of unspecified site: Secondary | ICD-10-CM

## 2015-12-06 DIAGNOSIS — C7951 Secondary malignant neoplasm of bone: Secondary | ICD-10-CM

## 2015-12-06 DIAGNOSIS — R197 Diarrhea, unspecified: Secondary | ICD-10-CM | POA: Diagnosis not present

## 2015-12-06 DIAGNOSIS — C787 Secondary malignant neoplasm of liver and intrahepatic bile duct: Secondary | ICD-10-CM

## 2015-12-06 DIAGNOSIS — C649 Malignant neoplasm of unspecified kidney, except renal pelvis: Secondary | ICD-10-CM

## 2015-12-06 DIAGNOSIS — C641 Malignant neoplasm of right kidney, except renal pelvis: Secondary | ICD-10-CM

## 2015-12-06 LAB — COMPREHENSIVE METABOLIC PANEL
ALT: 25 U/L (ref 0–55)
AST: 25 U/L (ref 5–34)
Albumin: 3 g/dL — ABNORMAL LOW (ref 3.5–5.0)
Alkaline Phosphatase: 76 U/L (ref 40–150)
Anion Gap: 8 mEq/L (ref 3–11)
BILIRUBIN TOTAL: 0.42 mg/dL (ref 0.20–1.20)
BUN: 12.9 mg/dL (ref 7.0–26.0)
CO2: 25 meq/L (ref 22–29)
Calcium: 9.4 mg/dL (ref 8.4–10.4)
Chloride: 108 mEq/L (ref 98–109)
Creatinine: 1 mg/dL (ref 0.7–1.3)
EGFR: 81 mL/min/{1.73_m2} — AB (ref >90)
GLUCOSE: 126 mg/dL (ref 70–140)
Potassium: 4.4 mEq/L (ref 3.5–5.1)
SODIUM: 141 meq/L (ref 136–145)
TOTAL PROTEIN: 6.8 g/dL (ref 6.4–8.3)

## 2015-12-06 LAB — CBC WITH DIFFERENTIAL/PLATELET
BASO%: 0.4 % (ref 0.0–2.0)
Basophils Absolute: 0 10*3/uL (ref 0.0–0.1)
EOS%: 5.3 % (ref 0.0–7.0)
Eosinophils Absolute: 0.2 10*3/uL (ref 0.0–0.5)
HCT: 35.5 % — ABNORMAL LOW (ref 38.4–49.9)
HGB: 11.6 g/dL — ABNORMAL LOW (ref 13.0–17.1)
LYMPH%: 21.4 % (ref 14.0–49.0)
MCH: 30.3 pg (ref 27.2–33.4)
MCHC: 32.7 g/dL (ref 32.0–36.0)
MCV: 92.7 fL (ref 79.3–98.0)
MONO#: 0.2 10*3/uL (ref 0.1–0.9)
MONO%: 8.1 % (ref 0.0–14.0)
NEUT%: 64.8 % (ref 39.0–75.0)
NEUTROS ABS: 1.9 10*3/uL (ref 1.5–6.5)
Platelets: 186 10*3/uL (ref 140–400)
RBC: 3.83 10*6/uL — AB (ref 4.20–5.82)
RDW: 17.9 % — ABNORMAL HIGH (ref 11.0–14.6)
WBC: 2.9 10*3/uL — AB (ref 4.0–10.3)
lymph#: 0.6 10*3/uL — ABNORMAL LOW (ref 0.9–3.3)

## 2015-12-06 MED ORDER — DIPHENOXYLATE-ATROPINE 2.5-0.025 MG PO TABS: 2.0000 | ORAL_TABLET | Freq: Four times a day (QID) | ORAL | 0 refills | Status: DC | PRN

## 2015-12-06 MED ORDER — SODIUM CHLORIDE 0.9 % IV SOLN
Freq: Once | INTRAVENOUS | Status: AC
  Administered 2015-12-06: 11:00:00 via INTRAVENOUS

## 2015-12-06 NOTE — Assessment & Plan Note (Signed)
Patient presented to the [facility] today with complaint of chronic diarrhea and subsequent dehydration.  He states he has been taken Lomotil and Imodium one tablet each on alternating basis with only minimal effectiveness.  He feels tired and dehydrated today.  He also states that he felt a little dizzy while he was playing in the bed this morning.  Patient denies any other new symptoms whatsoever.  He denies any recent fevers or chills.  Of note-patient continues to take Coreg and lisinopril 40 mg on a daily basis.  Patient confirmed that he did take his blood pressure medication earlier today.  He states that he only takes the Lasix on an as-needed basis; has not taken it recently.  Patient was advised that he may need to consider decreasing the dose of his lisinopril.  Reviewed all with Dr. Alen Blew; he recommended the patient follow-up with his prescribing physician in regards to the lisinopril dosing.  Vital signs were stable today and patient was afebrile.  Labs were all essentially within normal limits as well.  Patient was given a refill of his Lomotil prescription today.  He will also receive IV fluid rehydration today.  Patient was advised that he could return tomorrow for additional IV fluid rehydration.  If he would like.

## 2015-12-06 NOTE — Progress Notes (Signed)
SYMPTOM MANAGEMENT CLINIC    Chief Complaint: Diarrhea, dehydration  HPI:  Derek [name] 65 y.o. male diagnosed with renal cell carcinoma; with metastasis to the liver and bone.  Patient is status post right nephrectomy, chemotherapy and recent palliative radiation therapy to the spine that was completed in August 2017..  Currently undergoing cabozantinib oral therapy.    No history exists.    Review of Systems  Constitutional: Positive for malaise/fatigue and weight loss.  Gastrointestinal: Positive for diarrhea.  Neurological: Positive for weakness.  All other systems reviewed and are negative.   Past Medical History:  Diagnosis Date  . Arthritis   . Bone cancer ([place])    rcc with L1 pathologic compression fracture  . Cancer [facility])    rcc with L1 pathologic compression fx.  . Gout   . Hypertension   . NSTEMI (non-ST elevated myocardial infarction) ([place])   . Renal cell carcinoma ([place]) 11/28/2014  . Renal cell carcinoma ([place])   . Shingles 7/15    Past Surgical History:  Procedure Laterality Date  . CORONARY ARTERY BYPASS GRAFT N/A 01/27/2014   Procedure: CORONARY ARTERY BYPASS GRAFTING (CABG), ON PUMP, TIMES FOUR, USING LEFT INTERNAL MAMMARY ARTERY, RIGHT GREATER SAPHENOUS VEIN HARVESTED ENDOSCOPICALLY;  Surgeon: Gaye Pollack, MD;  Location: [place];  Service: Open Heart Surgery;  Laterality: N/A;  LIMA-LAD; SVG-OM; SVG-PD; SVG-DIAG  . KNEE SURGERY    . LEFT HEART CATHETERIZATION WITH CORONARY ANGIOGRAM N/A 01/23/2014   Procedure: LEFT HEART CATHETERIZATION WITH CORONARY ANGIOGRAM;  Surgeon: Blane Ohara, MD;  Location: [facility] CATH LAB;  Service: Cardiovascular;  Laterality: N/A;  . NEPHRECTOMY Right 11/28/2014   Procedure: RIGHT OPEN NEPHRECTOMY;  Surgeon: Cleon Gustin, MD;  Location: [place];  Service: Urology;  Laterality: Right;  . OPEN PARTIAL HEPATECTOMY  N/A 11/28/2014   Procedure: OPEN PARTIAL HEPATECTOMY;  Surgeon: [name], MD;  Location: [place];  Service:  General;  Laterality: N/A;  . TEE WITHOUT CARDIOVERSION N/A 01/27/2014   Procedure: TRANSESOPHAGEAL ECHOCARDIOGRAM (TEE);  Surgeon: Gaye Pollack, MD;  Location: [place];  Service: Open Heart Surgery;  Laterality: N/A;    has LVH (left ventricular hypertrophy) due to hypertensive disease; Systolic and diastolic CHF, acute ([place]); NSTEMI (non-ST elevated myocardial infarction) ([place]); HTN (hypertension); Obesity; Prediabetes; S/P CABG x 4; Gout; Metastatic renal cell carcinoma ([place]); Postoperative anemia due to acute blood loss; Hyperlipidemia; Medication management; L1 Metastasis; Diarrhea; and Dehydration on his problem list.    is allergic to lipitor [atorvastatin]; penicillins; and prednisone.    Medication List       Accurate as of 12/06/15 12:01 PM. Always use your most recent med list.          aspirin EC 81 MG tablet Take 81 mg by mouth daily.   cabozantinib S-Malate 60 MG Tabs Commonly known as:  CABOMETYX Take 1 tablet by mouth daily.   carvedilol 12.5 MG tablet Commonly known as:  COREG   colchicine 0.6 MG tablet Reported on 08/17/2015   diphenoxylate-atropine 2.5-0.025 MG tablet Commonly known as:  LOMOTIL Take 2 tablets by mouth 4 (four) times daily as needed for diarrhea or loose stools.   docusate sodium 100 MG capsule Commonly known as:  COLACE Take 1 capsule (100 mg total) by mouth 2 (two) times daily.   furosemide 20 MG tablet Commonly known as:  LASIX   HYDROmorphone 4 MG tablet Commonly known as:  DILAUDID 4 mg every 2-4 hours prn pain   KLOR-CON M20 20  MEQ tablet Generic drug:  potassium chloride SA TAKE ONE TABLET BY MOUTH ONCE DAILY   lisinopril 40 MG tablet Commonly known as:  PRINIVIL,ZESTRIL Take 1 tablet (40 mg total) by mouth daily.   LORazepam 0.5 MG tablet Commonly known as:  ATIVAN Take 0.5-1 mg by mouth at bedtime.   morphine 30 MG 12 hr tablet Commonly known as:  MS CONTIN Take 1 tablet (30 mg total) by mouth every 8 (eight)  hours.   multivitamin capsule Take 1 capsule by mouth daily.   omega-3 acid ethyl esters 1 g capsule Commonly known as:  LOVAZA Take 1 g by mouth daily.   ondansetron 4 MG tablet Commonly known as:  ZOFRAN TAKE ONE TABLET BY MOUTH THREE TIMES DAILY   Turmeric 500 MG Caps Take 500 mg by mouth daily.   ULORIC 40 MG tablet Generic drug:  febuxostat TAKE ONE TABLET BY MOUTH ONCE DAILY   Vitamin D-3 5000 UNITS Tabs Take 5,000 Units by mouth daily.        PHYSICAL EXAMINATION  Oncology Vitals 12/06/2015 12/06/2015  Height - -  Weight - -  Weight (lbs) - -  BMI (kg/m2) - -  Temp - -  Pulse 74 66  Resp - -  SpO2 - -  BSA (m2) - -   BP Readings from Last 2 Encounters:  12/06/15 116/74  11/20/15 136/73    Physical Exam  Constitutional: He is oriented to person, place, and time. Vital signs are normal. He appears malnourished and dehydrated. He appears unhealthy. He appears cachectic.  HENT:  Head: Normocephalic and atraumatic.  Eyes: Conjunctivae and EOM are normal. Pupils are equal, round, and reactive to light.  Neck: Normal range of motion.  Pulmonary/Chest: Effort normal. No respiratory distress.  Musculoskeletal: Normal range of motion. He exhibits no edema.  Neurological: He is alert and oriented to person, place, and time.  Skin: Skin is warm and dry. There is pallor.  Psychiatric: Affect normal.  Nursing note and vitals reviewed.   LABORATORY DATA:. Appointment on 12/06/2015  Component Date Value Ref Range Status  . WBC 12/06/2015 2.9* 4.0 - 10.3 10e3/uL Final  . NEUT# 12/06/2015 1.9  1.5 - 6.5 10e3/uL Final  . HGB 12/06/2015 11.6* 13.0 - 17.1 g/dL Final  . HCT 12/06/2015 35.5* 38.4 - 49.9 % Final  . Platelets 12/06/2015 186  140 - 400 10e3/uL Final  . MCV 12/06/2015 92.7  79.3 - 98.0 fL Final  . MCH 12/06/2015 30.3  27.2 - 33.4 pg Final  . MCHC 12/06/2015 32.7  32.0 - 36.0 g/dL Final  . RBC 12/06/2015 3.83* 4.20 - 5.82 10e6/uL Final  . RDW 12/06/2015  17.9* 11.0 - 14.6 % Final  . lymph# 12/06/2015 0.6* 0.9 - 3.3 10e3/uL Final  . MONO# 12/06/2015 0.2  0.1 - 0.9 10e3/uL Final  . Eosinophils Absolute 12/06/2015 0.2  0.0 - 0.5 10e3/uL Final  . Basophils Absolute 12/06/2015 0.0  0.0 - 0.1 10e3/uL Final  . NEUT% 12/06/2015 64.8  39.0 - 75.0 % Final  . LYMPH% 12/06/2015 21.4  14.0 - 49.0 % Final  . MONO% 12/06/2015 8.1  0.0 - 14.0 % Final  . EOS% 12/06/2015 5.3  0.0 - 7.0 % Final  . BASO% 12/06/2015 0.4  0.0 - 2.0 % Final  . Sodium 12/06/2015 141  136 - 145 mEq/L Final  . Potassium 12/06/2015 4.4  3.5 - 5.1 mEq/L Final  . Chloride 12/06/2015 108  98 - 109 mEq/L Final  .  CO2 12/06/2015 25  22 - 29 mEq/L Final  . Glucose 12/06/2015 126  70 - 140 mg/dl Final  . BUN 12/06/2015 12.9  7.0 - 26.0 mg/dL Final  . Creatinine 12/06/2015 1.0  0.7 - 1.3 mg/dL Final  . Total Bilirubin 12/06/2015 0.42  0.20 - 1.20 mg/dL Final  . Alkaline Phosphatase 12/06/2015 76  40 - 150 U/L Final  . AST 12/06/2015 25  5 - 34 U/L Final  . ALT 12/06/2015 25  0 - 55 U/L Final  . Total Protein 12/06/2015 6.8  6.4 - 8.3 g/dL Final  . Albumin 12/06/2015 3.0* 3.5 - 5.0 g/dL Final  . Calcium 12/06/2015 9.4  8.4 - 10.4 mg/dL Final  . Anion Gap 12/06/2015 8  3 - 11 mEq/L Final  . EGFR 12/06/2015 81* >90 ml/min/1.73 m2 Final    RADIOGRAPHIC STUDIES: No results found.  ASSESSMENT/PLAN:    Metastatic renal cell carcinoma (HCC) Patient continues to take cabozantinib orally on a daily basis as previously directed.  He presented to the [facility] today with complaint of chronic diarrhea; most likely secondary to his oral therapy.  See further notes for details of today's visit.  Patient states that he is scheduled to travel to [place] for a second opinion at [facility] this coming Monday, 12/10/2015.  He requested a refill of his Lomotil prior to traveling.  Also, he requested a prescription be written stating that patient may very well require wheelchair  assistance while in the airport.  Also, advised both patient and his wife that they can go online to the airline itself-and designated they will need wheelchair assistance.  Patient is scheduled to return on 12/26/2015 for labs and a follow-up visit.  Diarrhea Patient presented to the [place] today with complaint of chronic diarrhea and subsequent dehydration.  He states he has been taken Lomotil and Imodium one tablet each on alternating basis with only minimal effectiveness.  He feels tired and dehydrated today.  He also states that he felt a little dizzy while he was playing in the bed this morning.  Patient denies any other new symptoms whatsoever.  He denies any recent fevers or chills.  Of note-patient continues to take Coreg and lisinopril 40 mg on a daily basis.  Patient confirmed that he did take his blood pressure medication earlier today.  He states that he only takes the Lasix on an as-needed basis; has not taken it recently.  Patient was advised that he may need to consider decreasing the dose of his lisinopril.  Reviewed all with Dr. Alen Blew; he recommended the patient follow-up with his prescribing physician in regards to the lisinopril dosing.  Vital signs were stable today and patient was afebrile.  Labs were all essentially within normal limits as well.  Patient was given a refill of his Lomotil prescription today.  He will also receive IV fluid rehydration today.  Patient was advised that he could return tomorrow for additional IV fluid rehydration.  If he would like.   Patient stated understanding of all instructions; and was in agreement with this plan of care. The patient knows to call the clinic with any problems, questions or concerns.   Total time spent with patient was 25 minutes;  with greater than 75 percent of that time spent in face to face counseling regarding patient's symptoms,  and coordination of care and follow up.  Disclaimer:This dictation was prepared  with Dragon/digital dictation along with [employer]. Any transcriptional errors that result  from this process are unintentional.  Drue Second, NP 12/06/2015

## 2015-12-06 NOTE — Patient Instructions (Signed)

## 2015-12-06 NOTE — Assessment & Plan Note (Signed)
Patient continues to take cabozantinib orally on a daily basis as previously directed.  He presented to the [place] today with complaint of chronic diarrhea; most likely secondary to his oral therapy.  See further notes for details of today's visit.  Patient states that he is scheduled to travel to [place] for a second opinion at [facility] this coming Monday, 12/10/2015.  He requested a refill of his Lomotil prior to traveling.  Also, he requested a prescription be written stating that patient may very well require wheelchair assistance while in the airport.  Also, advised both patient and his wife that they can go online to the airline itself-and designated they will need wheelchair assistance.  Patient is scheduled to return on 12/26/2015 for labs and a follow-up visit.

## 2015-12-06 NOTE — Telephone Encounter (Addendum)
Spouse called asking for [facility]om Management [facility].   He's been having diarrhea for three days.  He needs IVF.  His B/P is low = 117/68."  Will notify [facility].  He will arrive within an hour.  Scheduling message sent.

## 2015-12-07 ENCOUNTER — Ambulatory Visit (HOSPITAL_BASED_OUTPATIENT_CLINIC_OR_DEPARTMENT_OTHER): Payer: Medicare Other | Admitting: Nurse Practitioner

## 2015-12-07 VITALS — BP 159/75 | HR 70 | Temp 97.7°F | Resp 18 | Ht 68.0 in | Wt 179.1 lb

## 2015-12-07 DIAGNOSIS — C641 Malignant neoplasm of right kidney, except renal pelvis: Secondary | ICD-10-CM | POA: Diagnosis present

## 2015-12-07 DIAGNOSIS — C7949 Secondary malignant neoplasm of other parts of nervous system: Secondary | ICD-10-CM

## 2015-12-07 MED ORDER — SODIUM CHLORIDE 0.9 % IV SOLN
Freq: Once | INTRAVENOUS | Status: AC
  Administered 2015-12-07: 10:00:00 via INTRAVENOUS

## 2015-12-07 NOTE — Progress Notes (Signed)
Pt feels much better after 2 days of IV fluids and controlling diarrhea with increased lomotil.  D/c home with wife

## 2015-12-07 NOTE — Patient Instructions (Signed)

## 2015-12-11 DIAGNOSIS — C7951 Secondary malignant neoplasm of bone: Secondary | ICD-10-CM | POA: Diagnosis not present

## 2015-12-11 DIAGNOSIS — C779 Secondary and unspecified malignant neoplasm of lymph node, unspecified: Secondary | ICD-10-CM | POA: Diagnosis not present

## 2015-12-11 DIAGNOSIS — C787 Secondary malignant neoplasm of liver and intrahepatic bile duct: Secondary | ICD-10-CM | POA: Diagnosis not present

## 2015-12-11 DIAGNOSIS — C641 Malignant neoplasm of right kidney, except renal pelvis: Secondary | ICD-10-CM | POA: Diagnosis not present

## 2015-12-11 DIAGNOSIS — C649 Malignant neoplasm of unspecified kidney, except renal pelvis: Secondary | ICD-10-CM | POA: Diagnosis not present

## 2015-12-11 DIAGNOSIS — I2510 Atherosclerotic heart disease of native coronary artery without angina pectoris: Secondary | ICD-10-CM | POA: Diagnosis not present

## 2015-12-11 DIAGNOSIS — M109 Gout, unspecified: Secondary | ICD-10-CM | POA: Diagnosis not present

## 2015-12-11 DIAGNOSIS — I10 Essential (primary) hypertension: Secondary | ICD-10-CM | POA: Diagnosis not present

## 2015-12-11 DIAGNOSIS — I252 Old myocardial infarction: Secondary | ICD-10-CM | POA: Diagnosis not present

## 2015-12-11 DIAGNOSIS — Z951 Presence of aortocoronary bypass graft: Secondary | ICD-10-CM | POA: Diagnosis not present

## 2015-12-11 DIAGNOSIS — Z79899 Other long term (current) drug therapy: Secondary | ICD-10-CM | POA: Diagnosis not present

## 2015-12-11 DIAGNOSIS — C772 Secondary and unspecified malignant neoplasm of intra-abdominal lymph nodes: Secondary | ICD-10-CM | POA: Diagnosis not present

## 2015-12-11 DIAGNOSIS — E7800 Pure hypercholesterolemia, unspecified: Secondary | ICD-10-CM | POA: Diagnosis not present

## 2015-12-12 DIAGNOSIS — C649 Malignant neoplasm of unspecified kidney, except renal pelvis: Secondary | ICD-10-CM | POA: Diagnosis not present

## 2015-12-12 DIAGNOSIS — Z923 Personal history of irradiation: Secondary | ICD-10-CM | POA: Diagnosis not present

## 2015-12-12 DIAGNOSIS — C7951 Secondary malignant neoplasm of bone: Secondary | ICD-10-CM | POA: Diagnosis not present

## 2015-12-12 DIAGNOSIS — Z9221 Personal history of antineoplastic chemotherapy: Secondary | ICD-10-CM | POA: Diagnosis not present

## 2015-12-12 DIAGNOSIS — R918 Other nonspecific abnormal finding of lung field: Secondary | ICD-10-CM | POA: Diagnosis not present

## 2015-12-12 DIAGNOSIS — C641 Malignant neoplasm of right kidney, except renal pelvis: Secondary | ICD-10-CM | POA: Diagnosis not present

## 2015-12-13 DIAGNOSIS — C787 Secondary malignant neoplasm of liver and intrahepatic bile duct: Secondary | ICD-10-CM | POA: Diagnosis not present

## 2015-12-13 DIAGNOSIS — I252 Old myocardial infarction: Secondary | ICD-10-CM | POA: Diagnosis not present

## 2015-12-13 DIAGNOSIS — I10 Essential (primary) hypertension: Secondary | ICD-10-CM | POA: Diagnosis not present

## 2015-12-13 DIAGNOSIS — C779 Secondary and unspecified malignant neoplasm of lymph node, unspecified: Secondary | ICD-10-CM | POA: Diagnosis not present

## 2015-12-13 DIAGNOSIS — E7800 Pure hypercholesterolemia, unspecified: Secondary | ICD-10-CM | POA: Diagnosis not present

## 2015-12-13 DIAGNOSIS — Z951 Presence of aortocoronary bypass graft: Secondary | ICD-10-CM | POA: Diagnosis not present

## 2015-12-13 DIAGNOSIS — C641 Malignant neoplasm of right kidney, except renal pelvis: Secondary | ICD-10-CM | POA: Diagnosis not present

## 2015-12-13 DIAGNOSIS — Z79899 Other long term (current) drug therapy: Secondary | ICD-10-CM | POA: Diagnosis not present

## 2015-12-13 DIAGNOSIS — C7951 Secondary malignant neoplasm of bone: Secondary | ICD-10-CM | POA: Diagnosis not present

## 2015-12-13 DIAGNOSIS — Z923 Personal history of irradiation: Secondary | ICD-10-CM | POA: Diagnosis not present

## 2015-12-13 DIAGNOSIS — C649 Malignant neoplasm of unspecified kidney, except renal pelvis: Secondary | ICD-10-CM | POA: Diagnosis not present

## 2015-12-13 DIAGNOSIS — Z7982 Long term (current) use of aspirin: Secondary | ICD-10-CM | POA: Diagnosis not present

## 2015-12-13 DIAGNOSIS — R197 Diarrhea, unspecified: Secondary | ICD-10-CM | POA: Diagnosis not present

## 2015-12-13 DIAGNOSIS — Z905 Acquired absence of kidney: Secondary | ICD-10-CM | POA: Diagnosis not present

## 2015-12-17 ENCOUNTER — Other Ambulatory Visit: Payer: Self-pay | Admitting: Oncology

## 2015-12-17 MED FILL — *CABOMETYX 60 MG TABLET: 60 | 30 days supply | Qty: 30 | Fill #0

## 2015-12-26 ENCOUNTER — Telehealth: Payer: Self-pay | Admitting: Oncology

## 2015-12-26 ENCOUNTER — Other Ambulatory Visit (HOSPITAL_BASED_OUTPATIENT_CLINIC_OR_DEPARTMENT_OTHER): Payer: Medicare Other

## 2015-12-26 ENCOUNTER — Telehealth: Payer: Self-pay | Admitting: Medical

## 2015-12-26 ENCOUNTER — Ambulatory Visit (HOSPITAL_BASED_OUTPATIENT_CLINIC_OR_DEPARTMENT_OTHER): Payer: Medicare Other | Admitting: Oncology

## 2015-12-26 VITALS — BP 128/70 | HR 61 | Temp 97.5°F | Resp 18 | Ht 68.0 in | Wt 172.1 lb

## 2015-12-26 DIAGNOSIS — K521 Toxic gastroenteritis and colitis: Secondary | ICD-10-CM | POA: Diagnosis not present

## 2015-12-26 DIAGNOSIS — G893 Neoplasm related pain (acute) (chronic): Secondary | ICD-10-CM

## 2015-12-26 DIAGNOSIS — C641 Malignant neoplasm of right kidney, except renal pelvis: Secondary | ICD-10-CM

## 2015-12-26 DIAGNOSIS — C787 Secondary malignant neoplasm of liver and intrahepatic bile duct: Secondary | ICD-10-CM

## 2015-12-26 DIAGNOSIS — C649 Malignant neoplasm of unspecified kidney, except renal pelvis: Secondary | ICD-10-CM

## 2015-12-26 DIAGNOSIS — C799 Secondary malignant neoplasm of unspecified site: Principal | ICD-10-CM

## 2015-12-26 DIAGNOSIS — C7951 Secondary malignant neoplasm of bone: Secondary | ICD-10-CM | POA: Diagnosis not present

## 2015-12-26 LAB — CBC WITH DIFFERENTIAL/PLATELET
BASO%: 0.3 % (ref 0.0–2.0)
BASOS ABS: 0 10*3/uL (ref 0.0–0.1)
EOS%: 9 % — AB (ref 0.0–7.0)
Eosinophils Absolute: 0.3 10*3/uL (ref 0.0–0.5)
HEMATOCRIT: 36 % — AB (ref 38.4–49.9)
HEMOGLOBIN: 11.9 g/dL — AB (ref 13.0–17.1)
LYMPH#: 0.7 10*3/uL — AB (ref 0.9–3.3)
LYMPH%: 20.8 % (ref 14.0–49.0)
MCH: 31.4 pg (ref 27.2–33.4)
MCHC: 33.1 g/dL (ref 32.0–36.0)
MCV: 95 fL (ref 79.3–98.0)
MONO#: 0.2 10*3/uL (ref 0.1–0.9)
MONO%: 6.9 % (ref 0.0–14.0)
NEUT#: 2.2 10*3/uL (ref 1.5–6.5)
NEUT%: 63 % (ref 39.0–75.0)
PLATELETS: 188 10*3/uL (ref 140–400)
RBC: 3.79 10*6/uL — ABNORMAL LOW (ref 4.20–5.82)
RDW: 18.2 % — AB (ref 11.0–14.6)
WBC: 3.5 10*3/uL — ABNORMAL LOW (ref 4.0–10.3)

## 2015-12-26 LAB — COMPREHENSIVE METABOLIC PANEL
ALBUMIN: 3.1 g/dL — AB (ref 3.5–5.0)
ALK PHOS: 79 U/L (ref 40–150)
ALT: 28 U/L (ref 0–55)
AST: 27 U/L (ref 5–34)
Anion Gap: 8 mEq/L (ref 3–11)
BILIRUBIN TOTAL: 0.45 mg/dL (ref 0.20–1.20)
BUN: 16 mg/dL (ref 7.0–26.0)
CALCIUM: 9.5 mg/dL (ref 8.4–10.4)
CO2: 26 mEq/L (ref 22–29)
CREATININE: 1 mg/dL (ref 0.7–1.3)
Chloride: 105 mEq/L (ref 98–109)
EGFR: 82 mL/min/{1.73_m2} — ABNORMAL LOW (ref >90)
Glucose: 106 mg/dl (ref 70–140)
Potassium: 4.7 mEq/L (ref 3.5–5.1)
Sodium: 140 mEq/L (ref 136–145)
Total Protein: 7.2 g/dL (ref 6.4–8.3)

## 2015-12-26 MED ORDER — CABOZANTINIB S-MALATE 40 MG PO TABS: 40.0000 mg | ORAL_TABLET | Freq: Every day | ORAL | 0 refills | Status: DC

## 2015-12-26 MED ORDER — MORPHINE SULFATE ER 30 MG PO TBCR: 30.0000 mg | EXTENDED_RELEASE_TABLET | Freq: Three times a day (TID) | ORAL | 0 refills | Status: DC

## 2015-12-26 MED ORDER — HYDROMORPHONE HCL 4 MG PO TABS
ORAL_TABLET | ORAL | 0 refills | Status: DC
Start: 2015-12-26 — End: 2016-02-16

## 2015-12-26 MED FILL — HYDROmorphone HCL 4 MG TABS: 4 | 10 days supply | Qty: 60 | Fill #0

## 2015-12-26 MED FILL — MORPHINE SULF ER 30 MG TAB: 30 | 30 days supply | Qty: 90 | Fill #0

## 2015-12-26 NOTE — Telephone Encounter (Signed)
Pt has cancer history. I have not seen him in a while. Looks like he needs flu vaccine and maybe pneumonia vaccine. Will you call pt and see if he had flu vaccine. Also will look at his pneumonia vaccine hx. Looks like he needs second pneumovaccine if I am correct.

## 2015-12-26 NOTE — Progress Notes (Signed)
START ON PATHWAY REGIMEN - Renal Cell  [id number]: Nivolumab 240 mg q14 Days   A cycle is every 14 days:     Nivolumab (Opdivo(R)) 240 mg flat dose in 100 mL NS IV over 60 minutes. Inline filter required (low protein binding) Dose Mod: None Additional Orders: Severe immune-mediated reactions can occur (e.g. pneumonitis, colitis, and hepatitis). See prescribing information for more details including monitoring and required immediate management with steroids. Monitor thyroid, renal, liver  function tests, glucose, and sodium at baseline and periodically during therapy.  **Always confirm dose/schedule in your pharmacy ordering system**    Patient Characteristics: Metastatic, Clear Cell, Second Line, Prior Anti-Angiogenic Treatment AJCC Stage Grouping: IV Current evidence of distant metastases? Yes AJCC T Stage: X AJCC N Stage: X AJCC M Stage: X Does patient have oligometastatic disease? No Would you be surprised if this patient died  in the next year? I would NOT be surprised if this patient died in the next year Histology: Clear Cell Line of therapy: Second Line  Intent of Therapy: Non-Curative / Palliative Intent, Discussed with Patient

## 2015-12-26 NOTE — Telephone Encounter (Signed)
Gave patient avs report and appointments for November and december

## 2015-12-26 NOTE — Progress Notes (Signed)
Hematology and Oncology Follow Up Visit  Braxtyn Crilley [id number] [birth year] 65 y.o. 12/26/2015 11:28 AM   Principle Diagnosis: 65 year old with renal cell carcinoma. He was diagnosed in August 2016 measuring 15.3 x 10.9 x 3.0 cm in the right kidney. He has retroperitoneal lymphadenopathy and a 2.7 cm liver mass indicating stage IV disease.    Prior Therapy: He is S/P right open radical nephrectomy and retroperitoneal lymph node dissection done on 11/28/2014. He also had partial right hepatectomy.  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 Metastatic lymph node involvement noted in the sampled lymph nodes. He also had involvement of renal cell carcinoma in the liver resection sample. Votrient 800 mg daily started on 05/29/2015. Therapy discontinued in July 2017 because of progression of disease. Palliative radiation therapy to the lumbar spine completed on 10/15/2015.   Current therapy:   Cabometyx 60 mg daily started in July 2017.   Interim History:  Mr. Pierpont presents today for a follow-up visit. Since the last visit, he reports no major changes or complaints. His pain and his spine has improved dramatically and currently using morphine only in the morning. He does use Dilaudid as needed.His performance status and mobility have improved since the last visit. His appetite have been reasonable as well and his weight is relatively stable. He does not report any headaches or neurological deficits. He continues to ambulate without any difficulties and had not had any falls or syncope.   He continues to [employer] without significant complications. He continues to have diarrhea that is manageable at times although at times appears to be severe and interfering with his quality of life. He has also reported some periodic fatigue and interfering with his quality of life.   He does not report any headaches, blurry vision, syncope or  seizures. He does not report any fevers, chills, sweats. He does not report any chest pain, palpitation, orthopnea. He does not report any cough, wheezing or dyspnea on exertion. He does not report any flank pain, or hematochezia. He does not report any frequency, urgency or hesitancy. Does not report any lymphadenopathy or petechiae. Remaining review of systems unremarkable  Medications: I have reviewed the patient's current medications.  Current Outpatient Prescriptions  Medication Sig Dispense Refill  . aspirin EC 81 MG tablet Take 81 mg by mouth daily.    . cabozantinib S-Malate (CABOMETYX) 40 MG TABS Take 40 mg by mouth daily. 30 tablet 0  . carvedilol (COREG) 12.5 MG tablet   0  . Cholecalciferol (VITAMIN D-3) 5000 UNITS TABS Take 5,000 Units by mouth daily.     . colchicine 0.6 MG tablet Reported on 08/17/2015    . diphenoxylate-atropine (LOMOTIL) 2.5-0.025 MG tablet Take 2 tablets by mouth 4 (four) times daily as needed for diarrhea or loose stools. 60 tablet 0  . docusate sodium (COLACE) 100 MG capsule Take 1 capsule (100 mg total) by mouth 2 (two) times daily. 60 capsule 0  . furosemide (LASIX) 20 MG tablet   1  . HYDROmorphone (DILAUDID) 4 MG tablet 4 mg every 2-4 hours prn pain 60 tablet 0  . KLOR-CON M20 20 MEQ tablet TAKE ONE TABLET BY MOUTH ONCE DAILY 30 tablet 10  . lisinopril (PRINIVIL,ZESTRIL) 40 MG tablet Take 1 tablet (40 mg total) by mouth daily. 90 tablet 3  . LORazepam (ATIVAN) 0.5 MG tablet Take 0.5-1 mg by mouth at bedtime.    Marland Kitchen morphine (MS CONTIN) 30 MG  12 hr tablet Take 1 tablet (30 mg total) by mouth every 8 (eight) hours. 90 tablet 0  . Multiple Vitamin (MULTIVITAMIN) capsule Take 1 capsule by mouth daily.    Marland Kitchen omega-3 acid ethyl esters (LOVAZA) 1 g capsule Take 1 g by mouth daily.    . ondansetron (ZOFRAN) 4 MG tablet TAKE ONE TABLET BY MOUTH THREE TIMES DAILY 20 tablet 0  . Turmeric 500 MG CAPS Take 500 mg by mouth daily.     Marland Kitchen ULORIC 40 MG tablet TAKE ONE TABLET  BY MOUTH ONCE DAILY 30 tablet 1   No current facility-administered medications for this visit.      Allergies:  Allergies  Allergen Reactions  . Lipitor [Atorvastatin] Other (See Comments)    Myopathy...severe, with  Myoglobinuria, wheelchair bound for a few days  . Penicillins Anaphylaxis  . Prednisone Anxiety and Other (See Comments)    Extremely high blood pressure after taking medication for first time.    Past Medical History, Surgical history, Social history, and Family History were reviewed and updated.   Blood pressure 128/70, pulse 61, temperature 97.5 F (36.4 C), temperature source Oral, resp. rate 18, height 5\' 8"  (1.727 m), weight 172 lb 1.6 oz (78.1 kg), SpO2 100 %. ECOG: 1 General appearance: Alert, awake gentleman without distress. Head: Normocephalic, without obvious abnormality oral mucosa without ulcers or lesions. Neck: no adenopathy No thyroid masses. Lymph nodes: Cervical, supraclavicular, and axillary nodes normal. Heart:regular rate and rhythm, S1, S2 normal, no murmur, click, rub or gallop Lung:chest clear, no wheezing, rales, normal symmetric air entry.  Abdomin: soft, non-tender, without masses or organomegaly. No rebound or guarding. EXT:no erythema, induration, or nodules   Lab Results: Lab Results  Component Value Date   WBC 3.5 (L) 12/26/2015   HGB 11.9 (L) 12/26/2015   HCT 36.0 (L) 12/26/2015   MCV 95.0 12/26/2015   PLT 188 12/26/2015     Chemistry      Component Value Date/Time   NA 140 12/26/2015 1022   K 4.7 12/26/2015 1022   CL 98 10/12/2015 1007   CO2 26 12/26/2015 1022   BUN 16.0 12/26/2015 1022   CREATININE 1.0 12/26/2015 1022      Component Value Date/Time   CALCIUM 9.5 12/26/2015 1022   ALKPHOS 79 12/26/2015 1022   AST 27 12/26/2015 1022   ALT 28 12/26/2015 1022   BILITOT 0.45 12/26/2015 1022        Impression and Plan:  65 year old gentleman with the following issues:  1. 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 He is status post surgical resection in September 2016 and he is fully healed at this time.  CT scan on 05/11/2015 showed relapsed disease with a mass in the right nephrectomy bed which is undoubtedly related to kidney cancer.  CT scan on 09/10/2015 show progression of disease and currently receiving second line Cabometyx. He does have diarrhea related to this medication but currently manageable. The plan is to continue with the same dose and schedule.  CT scan on 11/16/2015 was reviewed and showed positive response to therapy with decrease in his psoas muscle metastasis. No new areas of metastasis noted at this time.  He did have repeat imaging studies done at [facility] after obtaining a second opinion air. His case was discussed with Dr. Megan Salon, a GU oncologist at [facility]. His recommendation at this time was to add Nivolumab to  a reduced dose Cabometyx for better disease control.  The risks and benefits of adding Nivolumab was discussed with the patient today. Complications include fatigue, tiredness, thyroid disease as well as dermatitis were discussed. The benefit would be better disease control and significant improvement in progression free survival as documented by clinical trials completed and [facility].  After discussion today he is agreeable to proceed in the near future. I will start by reducing his Cabometyx to 40 mg daily for better tolerance.  2. Renal insufficiency: His creatinine is within normal range and currently her baseline.  3. Hypertension: His blood pressure is within normal range. We'll continue to monitor him on this current medication case he develops hypertension.  4. L1 vertebral body involvement with tumor: Status post radiation therapy which was completed on 10/17/2015. His pain is much improved at this time.  5. Pain: He continues to use  long-acting morphine at daytime with as needed Dilaudid. Both these medications were refilled today.  6. Diarrhea: Clear instructions how to manage this with Lomotil and Imodium given to the patient. His diarrhea were improved with dose reduction.  7. Prognosis: This was discussed with the patient and his wife. He understands he has an incurable malignancy with a poor prognosis given the aggressive nature and the rapid progression of disease.  He continues to 1 aggressive measures and I certainly agree with that at this time..  8. Follow-up: Will be in the next 4 weeks.     [id number], MD 10/25/201711:28 AM

## 2015-12-27 ENCOUNTER — Telehealth: Payer: Self-pay | Admitting: Pharmacist

## 2015-12-27 MED FILL — CABOMETYX 40 MG TABLET: 40 | 30 days supply | Qty: 30 | Fill #0

## 2015-12-27 NOTE — Telephone Encounter (Addendum)
Called to follow up with patient.  Wife answered the phone and requested that I speak to her.  Provider's note discussed.  Wife says that patient has been going to multiple doctor's appts and unfortunately Derek [name] has not been on that list.  She also said that the patient does not take vaccines, "never have and never will."  She said patient does not have any needs at this time and will call back as needed.  She did not want to schedule an appt with Derek [name] at this time.    Immunizations postponed.

## 2015-12-27 NOTE — Telephone Encounter (Signed)
I read her note. I totally understand. In fact I thought  they never came in due to his multiple visits with other providers. Just aware of  his condition and thought at least vaccine might be useful. You could call her back and express this.

## 2015-12-27 NOTE — Telephone Encounter (Signed)
Received prescription for new dose of cabometyx, reduced to 40mg  daily for better tolerance.  MD may add Nivolumab in future.  Prescription faxed to [facility]. Fax confirmation received.   Raul Del, PharmD, BCPS, [facility] [phone]

## 2015-12-31 DIAGNOSIS — C641 Malignant neoplasm of right kidney, except renal pelvis: Secondary | ICD-10-CM | POA: Diagnosis not present

## 2016-01-02 ENCOUNTER — Other Ambulatory Visit: Payer: Medicare Other

## 2016-01-02 ENCOUNTER — Encounter: Payer: Self-pay | Admitting: *Deleted

## 2016-01-07 ENCOUNTER — Other Ambulatory Visit: Payer: Self-pay | Admitting: Family Medicine

## 2016-01-22 ENCOUNTER — Other Ambulatory Visit (HOSPITAL_BASED_OUTPATIENT_CLINIC_OR_DEPARTMENT_OTHER): Payer: Medicare Other

## 2016-01-22 ENCOUNTER — Ambulatory Visit (HOSPITAL_BASED_OUTPATIENT_CLINIC_OR_DEPARTMENT_OTHER): Payer: Medicare Other | Admitting: Oncology

## 2016-01-22 ENCOUNTER — Ambulatory Visit (HOSPITAL_BASED_OUTPATIENT_CLINIC_OR_DEPARTMENT_OTHER): Payer: Medicare Other

## 2016-01-22 ENCOUNTER — Telehealth: Payer: Self-pay | Admitting: Oncology

## 2016-01-22 ENCOUNTER — Other Ambulatory Visit: Payer: Self-pay | Admitting: Oncology

## 2016-01-22 ENCOUNTER — Encounter: Payer: Self-pay | Admitting: Oncology

## 2016-01-22 ENCOUNTER — Other Ambulatory Visit: Payer: Self-pay | Admitting: *Deleted

## 2016-01-22 ENCOUNTER — Telehealth: Payer: Self-pay | Admitting: *Deleted

## 2016-01-22 VITALS — BP 125/66 | HR 59 | Temp 97.8°F | Resp 18 | Ht 68.0 in | Wt 168.2 lb

## 2016-01-22 DIAGNOSIS — C787 Secondary malignant neoplasm of liver and intrahepatic bile duct: Secondary | ICD-10-CM

## 2016-01-22 DIAGNOSIS — C641 Malignant neoplasm of right kidney, except renal pelvis: Secondary | ICD-10-CM

## 2016-01-22 DIAGNOSIS — R197 Diarrhea, unspecified: Secondary | ICD-10-CM | POA: Diagnosis not present

## 2016-01-22 DIAGNOSIS — C649 Malignant neoplasm of unspecified kidney, except renal pelvis: Secondary | ICD-10-CM

## 2016-01-22 DIAGNOSIS — Z5112 Encounter for antineoplastic immunotherapy: Secondary | ICD-10-CM

## 2016-01-22 DIAGNOSIS — G893 Neoplasm related pain (acute) (chronic): Secondary | ICD-10-CM | POA: Diagnosis not present

## 2016-01-22 LAB — COMPREHENSIVE METABOLIC PANEL
ALBUMIN: 2.9 g/dL — AB (ref 3.5–5.0)
ALK PHOS: 75 U/L (ref 40–150)
ALT: 22 U/L (ref 0–55)
AST: 21 U/L (ref 5–34)
Anion Gap: 9 mEq/L (ref 3–11)
BILIRUBIN TOTAL: 0.44 mg/dL (ref 0.20–1.20)
BUN: 14.4 mg/dL (ref 7.0–26.0)
CO2: 26 mEq/L (ref 22–29)
CREATININE: 0.9 mg/dL (ref 0.7–1.3)
Calcium: 9.7 mg/dL (ref 8.4–10.4)
Chloride: 104 mEq/L (ref 98–109)
EGFR: 90 mL/min/{1.73_m2} — ABNORMAL LOW (ref >90)
GLUCOSE: 97 mg/dL (ref 70–140)
Potassium: 5 mEq/L (ref 3.5–5.1)
SODIUM: 140 meq/L (ref 136–145)
TOTAL PROTEIN: 7.2 g/dL (ref 6.4–8.3)

## 2016-01-22 LAB — CBC WITH DIFFERENTIAL/PLATELET
BASO%: 0.4 % (ref 0.0–2.0)
Basophils Absolute: 0 10*3/uL (ref 0.0–0.1)
EOS ABS: 0.2 10*3/uL (ref 0.0–0.5)
EOS%: 4.4 % (ref 0.0–7.0)
HCT: 34.2 % — ABNORMAL LOW (ref 38.4–49.9)
HEMOGLOBIN: 11.1 g/dL — AB (ref 13.0–17.1)
LYMPH%: 14.8 % (ref 14.0–49.0)
MCH: 31.7 pg (ref 27.2–33.4)
MCHC: 32.6 g/dL (ref 32.0–36.0)
MCV: 97.2 fL (ref 79.3–98.0)
MONO#: 0.3 10*3/uL (ref 0.1–0.9)
MONO%: 8.4 % (ref 0.0–14.0)
NEUT%: 72 % (ref 39.0–75.0)
NEUTROS ABS: 2.9 10*3/uL (ref 1.5–6.5)
Platelets: 267 10*3/uL (ref 140–400)
RBC: 3.52 10*6/uL — ABNORMAL LOW (ref 4.20–5.82)
RDW: 17.1 % — AB (ref 11.0–14.6)
WBC: 4 10*3/uL (ref 4.0–10.3)
lymph#: 0.6 10*3/uL — ABNORMAL LOW (ref 0.9–3.3)

## 2016-01-22 LAB — UA PROTEIN, DIPSTICK - CHCC: Protein, ur: <30 mg/dL

## 2016-01-22 MED ORDER — SODIUM CHLORIDE 0.9 % IV SOLN
Freq: Once | INTRAVENOUS | Status: AC
  Administered 2016-01-22: 11:00:00 via INTRAVENOUS

## 2016-01-22 MED ORDER — SODIUM CHLORIDE 0.9 % IV SOLN
240.0000 mg | Freq: Once | INTRAVENOUS | Status: AC
  Administered 2016-01-22: 240 mg via INTRAVENOUS
  Filled 2016-01-22: qty 20

## 2016-01-22 MED ORDER — CABOZANTINIB S-MALATE 40 MG PO TABS: 1.0000 | ORAL_TABLET | Freq: Every day | ORAL | 0 refills | Status: DC

## 2016-01-22 MED FILL — CABOMETYX 40 MG TABLET: 40 | 30 days supply | Qty: 30 | Fill #0

## 2016-01-22 NOTE — Progress Notes (Signed)
Hematology and Oncology Follow Up Visit  Derek [name] [id number] May 16, 1950 65 y.o. 01/22/2016 10:38 AM   Principle Diagnosis: 65 year old with renal cell carcinoma. He was diagnosed in August 2016 measuring 15.3 x 10.9 x 3.0 cm in the right kidney. He has retroperitoneal lymphadenopathy and a 2.7 cm liver mass indicating stage IV disease.    Prior Therapy: He is S/P right open radical nephrectomy and retroperitoneal lymph node dissection done on 11/28/2014. He also had partial right hepatectomy.  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 Metastatic lymph node involvement noted in the sampled lymph nodes. He also had involvement of renal cell carcinoma in the liver resection sample. Votrient 800 mg daily started on 05/29/2015. Therapy discontinued in July 2017 because of progression of disease. Palliative radiation therapy to the lumbar spine completed on 10/15/2015.   Current therapy:   Cabometyx 60 mg daily started in July 2017. His dose was reduced to 40 mg daily in October 2017. Nivolumab 240 mg daily first dose to start on 01/22/2016.  Interim History:  Derek [name] presents today for a follow-up visit. Since the last visit, he reports doing reasonably well. He was on a cruise for 10 days and was able to enjoy himself subtly. He tolerated the 40 mg dosing much better. His diarrhea have resolved and his fatigue is also improved. His appetite have been reasonable as well and his weight is relatively stable. He does not report any headaches or neurological deficits. He continues to ambulate without any difficulties and had not had any falls or syncope. He still uses a cane for stability more than anything else.  His pain is manageable and takes a long-acting morphine at the time and rarely any breakthrough pain medication during the day. He does not require a nighttime dose of morphine at this time.   He does not report any headaches,  blurry vision, syncope or seizures. He does not report any fevers, chills, sweats. He does not report any chest pain, palpitation, orthopnea. He does not report any cough, wheezing or dyspnea on exertion. He does not report any flank pain, or hematochezia. He does not report any frequency, urgency or hesitancy. Does not report any lymphadenopathy or petechiae. Remaining review of systems unremarkable  Medications: I have reviewed the patient's current medications.  Current Outpatient Prescriptions  Medication Sig Dispense Refill  . aspirin EC 81 MG tablet Take 81 mg by mouth daily.    . carvedilol (COREG) 12.5 MG tablet   0  . Cholecalciferol (VITAMIN D-3) 5000 UNITS TABS Take 5,000 Units by mouth daily.     . colchicine 0.6 MG tablet Reported on 08/17/2015    . diphenoxylate-atropine (LOMOTIL) 2.5-0.025 MG tablet Take 2 tablets by mouth 4 (four) times daily as needed for diarrhea or loose stools. 60 tablet 0  . docusate sodium (COLACE) 100 MG capsule Take 1 capsule (100 mg total) by mouth 2 (two) times daily. 60 capsule 0  . furosemide (LASIX) 20 MG tablet   1  . HYDROmorphone (DILAUDID) 4 MG tablet 4 mg every 2-4 hours prn pain 60 tablet 0  . KLOR-CON M20 20 MEQ tablet TAKE ONE TABLET BY MOUTH ONCE DAILY 30 tablet 10  . lisinopril (PRINIVIL,ZESTRIL) 40 MG tablet Take 1 tablet (40 mg total) by mouth daily. 90 tablet 3  . LORazepam (ATIVAN) 0.5 MG tablet Take 0.5-1 mg by mouth at bedtime.    Marland Kitchen morphine (MS CONTIN) 30 MG 12  hr tablet Take 1 tablet (30 mg total) by mouth every 8 (eight) hours. 90 tablet 0  . Multiple Vitamin (MULTIVITAMIN) capsule Take 1 capsule by mouth daily.    Marland Kitchen omega-3 acid ethyl esters (LOVAZA) 1 g capsule Take 1 g by mouth daily.    . ondansetron (ZOFRAN) 4 MG tablet TAKE ONE TABLET BY MOUTH THREE TIMES DAILY 20 tablet 0  . Turmeric 500 MG CAPS Take 500 mg by mouth daily.     Marland Kitchen ULORIC 40 MG tablet TAKE ONE TABLET BY MOUTH ONCE DAILY 30 tablet 1  . cabozantinib S-Malate  (CABOMETYX) 40 MG TABS Take 1 tablet by mouth daily. 30 tablet 0   No current facility-administered medications for this visit.      Allergies:  Allergies  Allergen Reactions  . Lipitor [Atorvastatin] Other (See Comments)    Myopathy...severe, with  Myoglobinuria, wheelchair bound for a few days  . Penicillins Anaphylaxis  . Prednisone Anxiety and Other (See Comments)    Extremely high blood pressure after taking medication for first time.    Past Medical History, Surgical history, Social history, and Family History were reviewed and updated.   Blood pressure 125/66, pulse (!) 59, temperature 97.8 F (36.6 C), temperature source Oral, resp. rate 18, height 5\' 8"  (1.727 m), weight 168 lb 3.2 oz (76.3 kg), SpO2 97 %. ECOG: 1 General appearance: A comfortable-appearing gentleman without distress. Head: Normocephalic, without obvious abnormality. No oral thrush. Neck: no adenopathy No thyroid masses. Lymph nodes: Cervical, supraclavicular, and axillary nodes normal. Heart:regular rate and rhythm, S1, S2 normal, no murmur, click, rub or gallop Lung:chest clear, no wheezing, rales, normal symmetric air entry.  Abdomin: soft, non-tender, without masses or organomegaly. No shifting dullness or ascites. EXT:no erythema, induration, or nodules   Lab Results: Lab Results  Component Value Date   WBC 4.0 01/22/2016   HGB 11.1 (L) 01/22/2016   HCT 34.2 (L) 01/22/2016   MCV 97.2 01/22/2016   PLT 267 01/22/2016     Chemistry      Component Value Date/Time   NA 140 01/22/2016 0956   K 5.0 01/22/2016 0956   CL 98 10/12/2015 1007   CO2 26 01/22/2016 0956   BUN 14.4 01/22/2016 0956   CREATININE 0.9 01/22/2016 0956      Component Value Date/Time   CALCIUM 9.7 01/22/2016 0956   ALKPHOS 75 01/22/2016 0956   AST 21 01/22/2016 0956   ALT 22 01/22/2016 0956   BILITOT 0.44 01/22/2016 0956        Impression and Plan:  65 year old gentleman with the following issues:  1. 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 He is status post surgical resection in September 2016 and he is fully healed at this time.  CT scan on 05/11/2015 showed relapsed disease with a mass in the right nephrectomy bed which is undoubtedly related to kidney cancer.  CT scan on 09/10/2015 show progression of disease and currently receiving second line Cabometyx. He does have diarrhea related to this medication but currently manageable. The plan is to continue with the same dose and schedule.  CT scan on 11/16/2015 was reviewed and showed positive response to therapy with decrease in his psoas muscle metastasis. No new areas of metastasis noted at this time.  He did have repeat imaging studies done at [facility] at [facility] after obtaining a second opinion air. His case was discussed with Dr. Megan Salon, a [facility]  oncologist at [facility]. His recommendation at this time was to add Nivolumab to a reduced dose Cabometyx for better disease control.  He is currently on Cabometyx to 40 mg daily and tolerated it well. The plan is to give cycle 1 of Nivolumab today and repeated every 2 weeks. Complications of this medication were reviewed today and anticipate good tolerance. Arthralgias, myalgias as well as pneumonitis complications were reiterated. Also thyroid disease will be monitored while on this treatment.  2. Renal insufficiency: His creatinine is within normal range and currently her baseline.  3. Hypertension: His blood pressure is within normal range. We'll continue to monitor while he is on this current treatment.  4. L1 vertebral body involvement with tumor: Status post radiation therapy which was completed on 10/17/2015. His pain is much improved at this time.  5. Pain: He continues to use long-acting morphine at daytime with as needed Dilaudid. He has been using Dilaudid very infrequently.  6. Diarrhea:  This is significantly improved since last visit.  7. Thyroid function monitoring: TSH will be monitored closely in this medication.  8. Prognosis: He does have incurable malignancy but his performance status remains adequate and aggressive treatment will continue.  9. Follow-up: Will be in 2 weeks.   [id number], MD 11/21/201710:38 AM

## 2016-01-22 NOTE — Progress Notes (Signed)
Introduced myself as his FA.  Pt has 2 insurances so copay assistance is not needed but offered the [facility] grant to assist w/ gas cards.  Pt would like to apply.  He isn't receiving income at this time so he will provide a letter of support from his son.

## 2016-01-22 NOTE — Telephone Encounter (Signed)
Message sent to chemo scheduler to be added. Appointments scheduled per 01/22/16 los. A copy of the AVS report and appointment schedule, was given to patient, per 01/22/16 los.

## 2016-01-22 NOTE — Patient Instructions (Signed)
[place] Discharge Instructions for Patients Receiving Chemotherapy  Today you received the following chemotherapy agents  To help prevent nausea and vomiting after your treatment, we encourage you to take your nausea medication as prescribed   If you develop nausea and vomiting that is not controlled by your nausea medication, call the clinic.   BELOW ARE SYMPTOMS THAT SHOULD BE REPORTED IMMEDIATELY:  *FEVER GREATER THAN 100.5 F  *CHILLS WITH OR WITHOUT FEVER  NAUSEA AND VOMITING THAT IS NOT CONTROLLED WITH YOUR NAUSEA MEDICATION  *UNUSUAL SHORTNESS OF BREATH  *UNUSUAL BRUISING OR BLEEDING  TENDERNESS IN MOUTH AND THROAT WITH OR WITHOUT PRESENCE OF ULCERS  *URINARY PROBLEMS  *BOWEL PROBLEMS  UNUSUAL RASH Items with * indicate a potential emergency and should be followed up as soon as possible.  Feel free to call the clinic you have any questions or concerns. The clinic phone number is [phone].  Please show the [place] at check-in to the Emergency Department and triage nurse.  Nivolumab injection What is this medicine? NIVOLUMAB (nye VOL ue mab) is a monoclonal antibody. It is used to treat melanoma, lung cancer, kidney cancer, head and neck cancer, Hodgkin lymphoma, and urothelial cancer. COMMON BRAND NAME(S): Opdivo What should I tell my health care provider before I take this medicine? They need to know if you have any of these conditions: -diabetes -immune system problems -kidney disease -liver disease -lung disease -organ transplant -stomach or intestine problems -thyroid disease -an unusual or allergic reaction to nivolumab, other medicines, foods, dyes, or preservatives -pregnant or trying to get pregnant -breast-feeding How should I use this medicine? This medicine is for infusion into a vein. It is given by a health care professional in a hospital or clinic setting. A special MedGuide will be given to you before each  treatment. Be sure to read this information carefully each time. Talk to your pediatrician regarding the use of this medicine in children. Special care may be needed. What if I miss a dose? It is important not to miss your dose. Call your doctor or health care professional if you are unable to keep an appointment. What may interact with this medicine? Interactions have not been studied. Give your health care provider a list of all the medicines, herbs, non-prescription drugs, or dietary supplements you use. Also tell them if you smoke, drink alcohol, or use illegal drugs. Some items may interact with your medicine. What should I watch for while using this medicine? This drug may make you feel generally unwell. Continue your course of treatment even though you feel ill unless your doctor tells you to stop. You may need blood work done while you are taking this medicine. Do not become pregnant while taking this medicine or for 5 months after stopping it. Women should inform their doctor if they wish to become pregnant or think they might be pregnant. There is a potential for serious side effects to an unborn child. Talk to your health care professional or pharmacist for more information. Do not breast-feed an infant while taking this medicine. What side effects may I notice from receiving this medicine? Side effects that you should report to your doctor or health care professional as soon as possible: -allergic reactions like skin rash, itching or hives, swelling of the face, lips, or tongue -black, tarry stools -blood in the urine -bloody or watery diarrhea -changes in vision -change in sex drive -changes in emotions or moods -chest pain -confusion -cough -decreased appetite -  diarrhea -facial flushing -feeling faint or lightheaded -fever, chills -hair loss -hallucination, loss of contact with reality -headache -irritable -joint pain -loss of memory -muscle pain -muscle  weakness -seizures -shortness of breath -signs and symptoms of high blood sugar such as dizziness; dry mouth; dry skin; fruity breath; nausea; stomach pain; increased hunger or thirst; increased urination -signs and symptoms of kidney injury like trouble passing urine or change in the amount of urine -signs and symptoms of liver injury like dark yellow or brown urine; general ill feeling or flu-like symptoms; light-colored stools; loss of appetite; nausea; right upper belly pain; unusually weak or tired; yellowing of the eyes or skin -stiff neck -swelling of the ankles, feet, hands -weight gain Side effects that usually do not require medical attention (report to your doctor or health care professional if they continue or are bothersome): -bone pain -constipation -tiredness -vomiting Where should I keep my medicine? This drug is given in a hospital or clinic and will not be stored at home.  2017 Elsevier/Gold Standard (2015-04-06 09:04:36)

## 2016-01-22 NOTE — Telephone Encounter (Signed)
Per LOS I have scheduled appts and notified the scheduler 

## 2016-02-04 ENCOUNTER — Ambulatory Visit (HOSPITAL_COMMUNITY)
Admission: RE | Admit: 2016-02-04 | Discharge: 2016-02-04 | Disposition: A | Discharge status: Home / Self Care | Payer: Medicare Other | Source: Ambulatory Visit | Attending: Nurse Practitioner | Admitting: Nurse Practitioner

## 2016-02-04 ENCOUNTER — Telehealth: Payer: Self-pay | Admitting: *Deleted

## 2016-02-04 ENCOUNTER — Ambulatory Visit (HOSPITAL_BASED_OUTPATIENT_CLINIC_OR_DEPARTMENT_OTHER): Payer: Medicare Other

## 2016-02-04 ENCOUNTER — Other Ambulatory Visit: Payer: Self-pay | Admitting: Nurse Practitioner

## 2016-02-04 ENCOUNTER — Ambulatory Visit (HOSPITAL_BASED_OUTPATIENT_CLINIC_OR_DEPARTMENT_OTHER): Payer: Medicare Other | Admitting: Nurse Practitioner

## 2016-02-04 ENCOUNTER — Telehealth: Payer: Self-pay | Admitting: Nurse Practitioner

## 2016-02-04 VITALS — BP 125/87 | HR 95 | Temp 98.2°F | Resp 18 | Ht 68.0 in | Wt 162.0 lb

## 2016-02-04 DIAGNOSIS — M5126 Other intervertebral disc displacement, lumbar region: Secondary | ICD-10-CM | POA: Diagnosis not present

## 2016-02-04 DIAGNOSIS — C641 Malignant neoplasm of right kidney, except renal pelvis: Secondary | ICD-10-CM

## 2016-02-04 DIAGNOSIS — C799 Secondary malignant neoplasm of unspecified site: Principal | ICD-10-CM

## 2016-02-04 DIAGNOSIS — C7949 Secondary malignant neoplasm of other parts of nervous system: Secondary | ICD-10-CM

## 2016-02-04 DIAGNOSIS — C7951 Secondary malignant neoplasm of bone: Secondary | ICD-10-CM | POA: Diagnosis not present

## 2016-02-04 DIAGNOSIS — E860 Dehydration: Secondary | ICD-10-CM | POA: Diagnosis not present

## 2016-02-04 DIAGNOSIS — M4856XA Collapsed vertebra, not elsewhere classified, lumbar region, initial encounter for fracture: Secondary | ICD-10-CM | POA: Diagnosis not present

## 2016-02-04 DIAGNOSIS — C787 Secondary malignant neoplasm of liver and intrahepatic bile duct: Secondary | ICD-10-CM | POA: Diagnosis not present

## 2016-02-04 DIAGNOSIS — Z9889 Other specified postprocedural states: Secondary | ICD-10-CM | POA: Insufficient documentation

## 2016-02-04 DIAGNOSIS — M48061 Spinal stenosis, lumbar region without neurogenic claudication: Secondary | ICD-10-CM | POA: Diagnosis not present

## 2016-02-04 DIAGNOSIS — C649 Malignant neoplasm of unspecified kidney, except renal pelvis: Secondary | ICD-10-CM

## 2016-02-04 LAB — COMPREHENSIVE METABOLIC PANEL
ALT: 13 U/L (ref 0–55)
AST: 17 U/L (ref 5–34)
Albumin: 2.5 g/dL — ABNORMAL LOW (ref 3.5–5.0)
Alkaline Phosphatase: 88 U/L (ref 40–150)
Anion Gap: 10 mEq/L (ref 3–11)
BUN: 16 mg/dL (ref 7.0–26.0)
CO2: 27 meq/L (ref 22–29)
Calcium: 9.8 mg/dL (ref 8.4–10.4)
Chloride: 91 mEq/L — ABNORMAL LOW (ref 98–109)
Creatinine: 0.9 mg/dL (ref 0.7–1.3)
EGFR: 88 mL/min/{1.73_m2} — AB (ref >90)
GLUCOSE: 119 mg/dL (ref 70–140)
POTASSIUM: 5.1 meq/L (ref 3.5–5.1)
SODIUM: 128 meq/L — AB (ref 136–145)
Total Bilirubin: 0.65 mg/dL (ref 0.20–1.20)
Total Protein: 7.4 g/dL (ref 6.4–8.3)

## 2016-02-04 LAB — CBC WITH DIFFERENTIAL/PLATELET
BASO%: 0.1 % (ref 0.0–2.0)
BASOS ABS: 0 10*3/uL (ref 0.0–0.1)
EOS ABS: 0 10*3/uL (ref 0.0–0.5)
EOS%: 0.1 % (ref 0.0–7.0)
HCT: 34.4 % — ABNORMAL LOW (ref 38.4–49.9)
HGB: 11.3 g/dL — ABNORMAL LOW (ref 13.0–17.1)
LYMPH%: 4.6 % — AB (ref 14.0–49.0)
MCH: 31.6 pg (ref 27.2–33.4)
MCHC: 32.8 g/dL (ref 32.0–36.0)
MCV: 96.4 fL (ref 79.3–98.0)
MONO#: 0.5 10*3/uL (ref 0.1–0.9)
MONO%: 7.1 % (ref 0.0–14.0)
NEUT%: 88.1 % — AB (ref 39.0–75.0)
NEUTROS ABS: 6.3 10*3/uL (ref 1.5–6.5)
Platelets: 280 10*3/uL (ref 140–400)
RBC: 3.57 10*6/uL — AB (ref 4.20–5.82)
RDW: 15.3 % — ABNORMAL HIGH (ref 11.0–14.6)
WBC: 7.2 10*3/uL (ref 4.0–10.3)
lymph#: 0.3 10*3/uL — ABNORMAL LOW (ref 0.9–3.3)

## 2016-02-04 LAB — UA PROTEIN, DIPSTICK - CHCC: Protein, ur: NEGATIVE mg/dL

## 2016-02-04 MED ORDER — MORPHINE SULFATE 4 MG/ML IJ SOLN
2.0000 mg | Freq: Once | INTRAMUSCULAR | Status: AC
  Administered 2016-02-04: 2 mg via INTRAVENOUS
  Filled 2016-02-04: qty 1

## 2016-02-04 MED ORDER — MORPHINE SULFATE (PF) 4 MG/ML IV SOLN
INTRAVENOUS | Status: AC
  Filled 2016-02-04: qty 1

## 2016-02-04 MED ORDER — SODIUM CHLORIDE 0.9 % IV SOLN
INTRAVENOUS | Status: AC
  Administered 2016-02-04: 14:00:00 via INTRAVENOUS

## 2016-02-04 MED ORDER — GADOBENATE DIMEGLUMINE 529 MG/ML IV SOLN: 15.0000 mL | Freq: Once | INTRAVENOUS | Status: DC | PRN

## 2016-02-04 MED ORDER — GADOBENATE DIMEGLUMINE 529 MG/ML IV SOLN
15.0000 mL | Freq: Once | INTRAVENOUS | Status: AC | PRN
  Administered 2016-02-04: 15 mL via INTRAVENOUS

## 2016-02-04 NOTE — Telephone Encounter (Signed)
Called for further assessment.  "We have talked to [employer] and on our way in.  Thanks for calling."

## 2016-02-04 NOTE — Progress Notes (Signed)
SYMPTOM MANAGEMENT CLINIC    Chief Complaint: Back pain, right thigh pain  HPI:  Derek [name] 65 y.o. male diagnosed with renal cell carcinoma with metastasis to the liver and to the spine.  Currently undergoing nivol immunotherapy.umab     No history exists.    Review of Systems  Constitutional: Positive for weight loss.  Musculoskeletal: Positive for back pain.  Neurological: Positive for weakness.  All other systems reviewed and are negative.   Past Medical History:  Diagnosis Date  . Arthritis   . Bone cancer ([place])    rcc with L1 pathologic compression fracture  . Cancer [facility])    rcc with L1 pathologic compression fx.  . Gout   . Hypertension   . NSTEMI (non-ST elevated myocardial infarction) ([place])   . Renal cell carcinoma ([place]) 11/28/2014  . Renal cell carcinoma ([place])   . Shingles 7/15    Past Surgical History:  Procedure Laterality Date  . CORONARY ARTERY BYPASS GRAFT N/A 01/27/2014   Procedure: CORONARY ARTERY BYPASS GRAFTING (CABG), ON PUMP, TIMES FOUR, USING LEFT INTERNAL MAMMARY ARTERY, RIGHT GREATER SAPHENOUS VEIN HARVESTED ENDOSCOPICALLY;  Surgeon: Gaye Pollack, MD;  Location: [place];  Service: Open Heart Surgery;  Laterality: N/A;  LIMA-LAD; SVG-OM; SVG-PD; SVG-DIAG  . KNEE SURGERY    . LEFT HEART CATHETERIZATION WITH CORONARY ANGIOGRAM N/A 01/23/2014   Procedure: LEFT HEART CATHETERIZATION WITH CORONARY ANGIOGRAM;  Surgeon: Blane Ohara, MD;  Location: [facility] CATH LAB;  Service: Cardiovascular;  Laterality: N/A;  . NEPHRECTOMY Right 11/28/2014   Procedure: RIGHT OPEN NEPHRECTOMY;  Surgeon: Cleon Gustin, MD;  Location: [place];  Service: Urology;  Laterality: Right;  . OPEN PARTIAL HEPATECTOMY  N/A 11/28/2014   Procedure: OPEN PARTIAL HEPATECTOMY;  Surgeon: [name], MD;  Location: [place];  Service: General;  Laterality: N/A;  . TEE WITHOUT CARDIOVERSION N/A 01/27/2014   Procedure: TRANSESOPHAGEAL ECHOCARDIOGRAM (TEE);  Surgeon: Gaye Pollack, MD;   Location: [place];  Service: Open Heart Surgery;  Laterality: N/A;    has LVH (left ventricular hypertrophy) due to hypertensive disease; Systolic and diastolic CHF, acute ([place]); NSTEMI (non-ST elevated myocardial infarction) ([place]); HTN (hypertension); Obesity; Prediabetes; S/P CABG x 4; Gout; Metastatic renal cell carcinoma ([place]); Postoperative anemia due to acute blood loss; Hyperlipidemia; Medication management; L1 Metastasis; Diarrhea; and Dehydration on his problem list.    is allergic to lipitor [atorvastatin]; penicillins; and prednisone.    Medication List       Accurate as of 02/04/16 11:59 PM. Always use your most recent med list.          aspirin EC 81 MG tablet Take 81 mg by mouth daily.   cabozantinib S-Malate 40 MG Tabs Commonly known as:  CABOMETYX Take 1 tablet by mouth daily.   carvedilol 12.5 MG tablet Commonly known as:  COREG   colchicine 0.6 MG tablet Reported on 08/17/2015   diphenoxylate-atropine 2.5-0.025 MG tablet Commonly known as:  LOMOTIL Take 2 tablets by mouth 4 (four) times daily as needed for diarrhea or loose stools.   docusate sodium 100 MG capsule Commonly known as:  COLACE Take 1 capsule (100 mg total) by mouth 2 (two) times daily.   furosemide 20 MG tablet Commonly known as:  LASIX   HYDROmorphone 4 MG tablet Commonly known as:  DILAUDID 4 mg every 2-4 hours prn pain   KLOR-CON M20 20 MEQ tablet Generic drug:  potassium chloride SA TAKE ONE TABLET BY MOUTH ONCE DAILY   lisinopril  40 MG tablet Commonly known as:  PRINIVIL,ZESTRIL Take 1 tablet (40 mg total) by mouth daily.   LORazepam 0.5 MG tablet Commonly known as:  ATIVAN Take 0.5-1 mg by mouth at bedtime.   morphine 30 MG 12 hr tablet Commonly known as:  MS CONTIN Take 1 tablet (30 mg total) by mouth every 8 (eight) hours.   multivitamin capsule Take 1 capsule by mouth daily.   omega-3 acid ethyl esters 1 g capsule Commonly known as:  LOVAZA Take 1 g by mouth  daily.   ondansetron 4 MG tablet Commonly known as:  ZOFRAN TAKE ONE TABLET BY MOUTH THREE TIMES DAILY   Turmeric 500 MG Caps Take 500 mg by mouth daily.   ULORIC 40 MG tablet Generic drug:  febuxostat TAKE ONE TABLET BY MOUTH ONCE DAILY   Vitamin D-3 5000 units Tabs Take 5,000 Units by mouth daily.        PHYSICAL EXAMINATION  Oncology Vitals 02/04/2016 02/04/2016  Height 173 cm 173 cm  Weight 73.483 kg 73.483 kg  Weight (lbs) 162 lbs 162 lbs  BMI (kg/m2) 24.63 kg/m2 24.63 kg/m2  Temp - 98.2  Pulse - 95  Resp - 18  SpO2 - 100  BSA (m2) 1.88 m2 1.88 m2   BP Readings from Last 2 Encounters:  02/04/16 125/87  01/22/16 125/66    Physical Exam  Constitutional: He is oriented to person, place, and time. Vital signs are normal. He appears dehydrated. He appears unhealthy.  HENT:  Head: Normocephalic and atraumatic.  Mouth/Throat: Oropharynx is clear and moist.  Eyes: Conjunctivae and EOM are normal. Pupils are equal, round, and reactive to light. Right eye exhibits no discharge. Left eye exhibits no discharge. No scleral icterus.  Neck: Normal range of motion. Neck supple. No JVD present. No tracheal deviation present. No thyromegaly present.  Cardiovascular: Normal rate, regular rhythm, normal heart sounds and intact distal pulses.   Pulmonary/Chest: Effort normal and breath sounds normal. No respiratory distress. He has no wheezes. He has no rales. He exhibits no tenderness.  Abdominal: Soft. Bowel sounds are normal. He exhibits no distension and no mass. There is no tenderness. There is no rebound and no guarding.  Musculoskeletal: Normal range of motion. He exhibits no edema, tenderness or deformity.  No obvious tenderness to the back or to the right thigh on exam.  Patient observed with full range of motion; but was very unsteady on his feet.  Lymphadenopathy:    He has no cervical adenopathy.  Neurological: He is alert and oriented to person, place, and time.  Skin:  Skin is warm and dry. No rash noted. No erythema. No pallor.  Psychiatric: Affect normal.  Nursing note and vitals reviewed.   LABORATORY DATA:. Appointment on 02/04/2016  Component Date Value Ref Range Status  . WBC 02/04/2016 7.2  4.0 - 10.3 10e3/uL Final  . NEUT# 02/04/2016 6.3  1.5 - 6.5 10e3/uL Final  . HGB 02/04/2016 11.3* 13.0 - 17.1 g/dL Final  . HCT 02/04/2016 34.4* 38.4 - 49.9 % Final  . Platelets 02/04/2016 280  140 - 400 10e3/uL Final  . MCV 02/04/2016 96.4  79.3 - 98.0 fL Final  . MCH 02/04/2016 31.6  27.2 - 33.4 pg Final  . MCHC 02/04/2016 32.8  32.0 - 36.0 g/dL Final  . RBC 02/04/2016 3.57* 4.20 - 5.82 10e6/uL Final  . RDW 02/04/2016 15.3* 11.0 - 14.6 % Final  . lymph# 02/04/2016 0.3* 0.9 - 3.3 10e3/uL Final  . MONO# 02/04/2016  0.5  0.1 - 0.9 10e3/uL Final  . Eosinophils Absolute 02/04/2016 0.0  0.0 - 0.5 10e3/uL Final  . Basophils Absolute 02/04/2016 0.0  0.0 - 0.1 10e3/uL Final  . NEUT% 02/04/2016 88.1* 39.0 - 75.0 % Final  . LYMPH% 02/04/2016 4.6* 14.0 - 49.0 % Final  . MONO% 02/04/2016 7.1  0.0 - 14.0 % Final  . EOS% 02/04/2016 0.1  0.0 - 7.0 % Final  . BASO% 02/04/2016 0.1  0.0 - 2.0 % Final  . Sodium 02/04/2016 128* 136 - 145 mEq/L Final  . Potassium 02/04/2016 5.1  3.5 - 5.1 mEq/L Final  . Chloride 02/04/2016 91* 98 - 109 mEq/L Final  . CO2 02/04/2016 27  22 - 29 mEq/L Final  . Glucose 02/04/2016 119  70 - 140 mg/dl Final  . BUN 02/04/2016 16.0  7.0 - 26.0 mg/dL Final  . Creatinine 02/04/2016 0.9  0.7 - 1.3 mg/dL Final  . Total Bilirubin 02/04/2016 0.65  0.20 - 1.20 mg/dL Final  . Alkaline Phosphatase 02/04/2016 88  40 - 150 U/L Final  . AST 02/04/2016 17  5 - 34 U/L Final  . ALT 02/04/2016 13  0 - 55 U/L Final  . Total Protein 02/04/2016 7.4  6.4 - 8.3 g/dL Final  . Albumin 02/04/2016 2.5* 3.5 - 5.0 g/dL Final  . Calcium 02/04/2016 9.8  8.4 - 10.4 mg/dL Final  . Anion Gap 02/04/2016 10  3 - 11 mEq/L Final  . EGFR 02/04/2016 88* >90 ml/min/1.73 m2 Final   . Protein, ur 02/04/2016 Negative  Negative- <30 mg/dL Final    RADIOGRAPHIC STUDIES: Mr Lumbar Spine W Wo Contrast  Result Date: 02/04/2016 CLINICAL DATA:  65 y/o M; compression deformity, rule out cord compression. Status post right-sided radical nephrectomy 11/28/2014 EXAM: MRI LUMBAR SPINE WITHOUT AND WITH CONTRAST TECHNIQUE: Multiplanar and multiecho pulse sequences of the lumbar spine were obtained without and with intravenous contrast. CONTRAST:  59m MULTIHANCE GADOBENATE DIMEGLUMINE 529 MG/ML IV SOLN COMPARISON:  11/16/2015 CT abdomen and pelvis. 09/14/2015 lumbar MRI. FINDINGS: Segmentation:  Standard. Alignment:  Physiologic. Vertebrae: There has been interval resection of the right-sided psoas mass some extension into the superior aspect of the right L2 vertebral body. There is residual enhancement throughout the L1 vertebral body extending into the right pedicle and posterior elements where there is bony expansion. There is severe loss of height of the right aspect of the L1 vertebral body with retropulsion of the vertebral body into the spinal canal in the right central and subarticular zones. And there are multiple additional stable metastatic lesions scattered throughout the visible spine and sacrum. There is severe loss of height of the right aspect of the L1 vertebral body Conus medullaris: Extends to the L1-2 level and appears normal. Paraspinal and other soft tissues: There is a fluid collection within the right psoas muscle extending into the right retroperitoneal space likely related to surgical resection of the mass. Status post right nephrectomy. Stable right adrenal nodule. Left kidney upper pole cyst measuring up to 6.3 cm partially visualized without appreciable enhancing components. Disc levels: T12-L1: Expansion of the right pedicle from neoplasm moderately narrows the right-sided neural foramen. L1-2: Retropulsion of the right aspect of the vertebral body into the right central  and subarticular zone, a disc bulge, and expansion of the right pedicle from neoplasm. Moderate to severe right-sided foraminal narrowing, mild left-sided foraminal narrowing. The retropulsed vertebral body results in moderate to severe canal stenosis on the right with contact upon the conus  which is displaced leftward. There is no evidence for compression. L2-3: Small disc bulge and mild facet/ligamentum flavum hypertrophy. Mild bilateral foraminal and lateral recess narrowing. No significant canal stenosis. L3-4: Small disc bulge and mild-to-moderate facet/ligamentum flavum hypertrophy. Mild left-greater-than-right neural foraminal narrowing and lateral recess narrowing. No significant canal stenosis. L4-5: Small disc bulge and moderate bilateral facet/ligamentum flavum hypertrophy greater on the left. Moderate left and mild-to-moderate right foraminal narrowing. Mild canal stenosis. Effacement of lateral recesses with contact upon descending L5 nerve roots. L5-S1: Small disc bulge with moderate facet hypertrophy bilaterally. Moderate bilateral foraminal narrowing. No significant canal stenosis. IMPRESSION: 1. Severe pathologic compression deformity of the right aspect of the L1 vertebral body. Retropulsion of the right aspect of the L1 vertebral body combined with expansile neoplasm in the right-sided pedicle results in moderate to severe canal stenosis with contact upon the conus which is displaced leftward. No evidence for compression. 2. Multiple metastasis visible spine and sacrum. Postsurgical changes related to resection of the psoas mass. 3. No new pathologic compression deformity. 4. Lumbar degenerative changes greatest at L4-5 where there is mild canal stenosis and at L5-S1 where there is moderate bilateral foraminal narrowing. Electronically Signed   By: Kristine Garbe M.D.   On: 02/04/2016 16:59    ASSESSMENT/PLAN:    Metastatic renal cell carcinoma [facility]) Patient received cycle one of  his nivolumab immunotherapy on 01/22/2016.  He is scheduled to return tomorrow 02/05/2016 for follow-up visit and his next cycle of immunotherapy.  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  He has been diagnosed with compression fractures at the site as well.  He presented to the [facility] today with complaint of acute onset lower back pain with radiation, specifically to the right thigh area.  Typically, patient is able to ambulate with no difficulty-but is now unable to ambulate due to the pain.  When he does stand and bear weight-he is very unsteady on his feet.  He denies any numbness or tingling.  He also denies any urine or stool incontinence.  He also denies any recent fevers or chills.  Exam today reveals patient is able to bear weight; but is very unsteady on his feet.  He does appear to have full range of motion with his bilateral legs.  There is no obvious tenderness with palpation to the lower back or to the right thigh region.  Also, there is no rash noted.  Reviewed all findings of today's exam with Dr. Burr Medico;  and she agreed should obtain a lumbar MRI for further evaluation and to rule out cord compression.  Lumbar MRI obtained late this afternoon revealed: IMPRESSION: 1. Severe pathologic compression deformity of the right aspect of the L1 vertebral body. Retropulsion of the right aspect of the L1 vertebral body combined with expansile neoplasm in the right-sided pedicle results in moderate to severe canal stenosis with contact upon the conus which is displaced leftward. No evidence for compression. 2. Multiple metastasis visible spine and sacrum. Postsurgical changes related to resection of the psoas mass. 3. No new pathologic compression deformity. 4. Lumbar degenerative changes greatest at L4-5 where there is mild canal stenosis and at L5-S1 where there is moderate bilateral foraminal narrowing.  There was initial consideration to give patient  Decadron/steroids for his discomfort; but patient states that he is allergic to all types of steroids.  He states that the last time he received steroids.  He proceeded to have an MI and then heart surgery.  Confirm the  patient does have both long-acting and breakthrough pain medication at home to take on a regular basis.  Reviewed all findings of the MRI results with Dr. Burr Medico as well.  The plan is for the patient to return tomorrow to review all once again with Dr. Alen Blew.  He was advised to go directly to the emergency department overnight for any worsening symptoms whatsoever.   Dehydration Patient has had decreased appetite and poor oral intake recently.  He appears weak and dehydrated today.  Patient's sodium was 128.  However, patient only received approximately 500 ML's normal saline IV fluid rehydration today; since he had to leave to go to the MRI this afternoon.  He was encouraged to push fluids as well.   Patient stated understanding of all instructions; and was in agreement with this plan of care. The patient knows to call the clinic with any problems, questions or concerns.   Total time spent with patient was 40 minutes;  with greater than 75 percent of that time spent in face to face counseling regarding patient's symptoms,  and coordination of care and follow up.  Disclaimer:This dictation was prepared with Dragon/digital dictation along with [employer]. Any transcriptional errors that result from this process are unintentional.  Drue Second, NP 02/05/2016    1445 pt transported to Radiology for MRI of spine. Transported via w/c per Edwin Cap, NT and pt's son in attendance. Pt to return to [facility] after scan completed. Peripheral IV saline locked prior to his transport to radiology.  1630  Pt returned from MRI Scan. Iv  Had been d/c'd.  Waiting for results of scan.  Pt scheduled to see Dr. Alen Blew tomorrow and to get nivolumab. Pt will be able to get extra IVF tomorrow  during that appt.

## 2016-02-04 NOTE — Telephone Encounter (Signed)
Voicemail: "This is Medical laboratory scientific officer for [employer].  I'm trying to reach [facility] for him to be seen today.  He has a new pain, barely walk, won't eat for the last two days."

## 2016-02-05 ENCOUNTER — Other Ambulatory Visit: Payer: Medicare Other

## 2016-02-05 ENCOUNTER — Telehealth: Payer: Self-pay | Admitting: Oncology

## 2016-02-05 ENCOUNTER — Ambulatory Visit (HOSPITAL_BASED_OUTPATIENT_CLINIC_OR_DEPARTMENT_OTHER): Payer: Medicare Other

## 2016-02-05 ENCOUNTER — Telehealth: Payer: Self-pay | Admitting: *Deleted

## 2016-02-05 ENCOUNTER — Ambulatory Visit (HOSPITAL_BASED_OUTPATIENT_CLINIC_OR_DEPARTMENT_OTHER): Payer: Medicare Other | Admitting: Oncology

## 2016-02-05 ENCOUNTER — Encounter: Payer: Self-pay | Admitting: Nurse Practitioner

## 2016-02-05 VITALS — BP 108/50 | HR 72 | Temp 98.4°F | Resp 16 | Ht 68.0 in | Wt 161.9 lb

## 2016-02-05 DIAGNOSIS — Z5112 Encounter for antineoplastic immunotherapy: Secondary | ICD-10-CM | POA: Diagnosis present

## 2016-02-05 DIAGNOSIS — S32050A Wedge compression fracture of fifth lumbar vertebra, initial encounter for closed fracture: Secondary | ICD-10-CM

## 2016-02-05 DIAGNOSIS — C641 Malignant neoplasm of right kidney, except renal pelvis: Secondary | ICD-10-CM

## 2016-02-05 DIAGNOSIS — C649 Malignant neoplasm of unspecified kidney, except renal pelvis: Secondary | ICD-10-CM

## 2016-02-05 DIAGNOSIS — R197 Diarrhea, unspecified: Secondary | ICD-10-CM | POA: Diagnosis not present

## 2016-02-05 DIAGNOSIS — C787 Secondary malignant neoplasm of liver and intrahepatic bile duct: Secondary | ICD-10-CM | POA: Diagnosis not present

## 2016-02-05 DIAGNOSIS — C7951 Secondary malignant neoplasm of bone: Secondary | ICD-10-CM

## 2016-02-05 DIAGNOSIS — G893 Neoplasm related pain (acute) (chronic): Secondary | ICD-10-CM

## 2016-02-05 MED ORDER — SODIUM CHLORIDE 0.9 % IV SOLN
Freq: Once | INTRAVENOUS | Status: AC
  Administered 2016-02-05: 13:00:00 via INTRAVENOUS

## 2016-02-05 MED ORDER — SODIUM CHLORIDE 0.9 % IV SOLN
240.0000 mg | Freq: Once | INTRAVENOUS | Status: AC
  Administered 2016-02-05: 240 mg via INTRAVENOUS
  Filled 2016-02-05: qty 20

## 2016-02-05 MED ORDER — MAGIC MOUTHWASH: 5.0000 mL | Freq: Three times a day (TID) | ORAL | 0 refills | Status: AC | PRN

## 2016-02-05 MED FILL — MAGIC MOUTHWASH W/LIDO 1:1: 8 days supply | Qty: 120 | Fill #0

## 2016-02-05 NOTE — Assessment & Plan Note (Signed)
Patient received cycle one of his nivolumab immunotherapy on 01/22/2016.  He is scheduled to return tomorrow 02/05/2016 for follow-up visit and his next cycle of immunotherapy.

## 2016-02-05 NOTE — Telephone Encounter (Signed)
Per LOS I have scheduled appts and notified the scheduler 

## 2016-02-05 NOTE — Progress Notes (Signed)
[facility] magic mouthwash called to [place] outpatient pharmacy per patient's request.

## 2016-02-05 NOTE — Progress Notes (Signed)
Per patient's wife, patient was seen in [facility] yesterday for IVF's.  Unable to complete infusion d/t inadequate time (only 500 mL given).  Per patient's wife, Dr. Alen Blew advised that 500 mL NS would be given during tx today.  500 mL NS bolus given.

## 2016-02-05 NOTE — Assessment & Plan Note (Signed)
Patient has had decreased appetite and poor oral intake recently.  He appears weak and dehydrated today.  Patient's sodium was 128.  However, patient only received approximately 500 ML's normal saline IV fluid rehydration today; since he had to leave to go to the MRI this afternoon.  He was encouraged to push fluids as well.

## 2016-02-05 NOTE — Patient Instructions (Signed)
[facility] Discharge Instructions for Patients Receiving Chemotherapy  Today you received the following chemotherapy agents Nivolumab  To help prevent nausea and vomiting after your treatment, we encourage you to take your nausea medication as prescribed   If you develop nausea and vomiting that is not controlled by your nausea medication, call the clinic.   BELOW ARE SYMPTOMS THAT SHOULD BE REPORTED IMMEDIATELY:  *FEVER GREATER THAN 100.5 F  *CHILLS WITH OR WITHOUT FEVER  NAUSEA AND VOMITING THAT IS NOT CONTROLLED WITH YOUR NAUSEA MEDICATION  *UNUSUAL SHORTNESS OF BREATH  *UNUSUAL BRUISING OR BLEEDING  TENDERNESS IN MOUTH AND THROAT WITH OR WITHOUT PRESENCE OF ULCERS  *URINARY PROBLEMS  *BOWEL PROBLEMS  UNUSUAL RASH Items with * indicate a potential emergency and should be followed up as soon as possible.  Feel free to call the clinic you have any questions or concerns. The clinic phone number is [phone].  Please show the [place] [facility] at check-in to the Emergency Department and triage nurse.  Nivolumab injection What is this medicine? NIVOLUMAB (nye VOL ue mab) is a monoclonal antibody. It is used to treat melanoma, lung cancer, kidney cancer, head and neck cancer, Hodgkin lymphoma, and urothelial cancer. COMMON BRAND NAME(S): Opdivo What should I tell my health care provider before I take this medicine? They need to know if you have any of these conditions: -diabetes -immune system problems -kidney disease -liver disease -lung disease -organ transplant -stomach or intestine problems -thyroid disease -an unusual or allergic reaction to nivolumab, other medicines, foods, dyes, or preservatives -pregnant or trying to get pregnant -breast-feeding How should I use this medicine? This medicine is for infusion into a vein. It is given by a health care professional in a hospital or clinic setting. A special MedGuide will be given to you before  each treatment. Be sure to read this information carefully each time. Talk to your pediatrician regarding the use of this medicine in children. Special care may be needed. What if I miss a dose? It is important not to miss your dose. Call your doctor or health care professional if you are unable to keep an appointment. What may interact with this medicine? Interactions have not been studied. Give your health care provider a list of all the medicines, herbs, non-prescription drugs, or dietary supplements you use. Also tell them if you smoke, drink alcohol, or use illegal drugs. Some items may interact with your medicine. What should I watch for while using this medicine? This drug may make you feel generally unwell. Continue your course of treatment even though you feel ill unless your doctor tells you to stop. You may need blood work done while you are taking this medicine. Do not become pregnant while taking this medicine or for 5 months after stopping it. Women should inform their doctor if they wish to become pregnant or think they might be pregnant. There is a potential for serious side effects to an unborn child. Talk to your health care professional or pharmacist for more information. Do not breast-feed an infant while taking this medicine. What side effects may I notice from receiving this medicine? Side effects that you should report to your doctor or health care professional as soon as possible: -allergic reactions like skin rash, itching or hives, swelling of the face, lips, or tongue -black, tarry stools -blood in the urine -bloody or watery diarrhea -changes in vision -change in sex drive -changes in emotions or moods -chest pain -confusion -cough -decreased  appetite -diarrhea -facial flushing -feeling faint or lightheaded -fever, chills -hair loss -hallucination, loss of contact with reality -headache -irritable -joint pain -loss of memory -muscle pain -muscle  weakness -seizures -shortness of breath -signs and symptoms of high blood sugar such as dizziness; dry mouth; dry skin; fruity breath; nausea; stomach pain; increased hunger or thirst; increased urination -signs and symptoms of kidney injury like trouble passing urine or change in the amount of urine -signs and symptoms of liver injury like dark yellow or brown urine; general ill feeling or flu-like symptoms; light-colored stools; loss of appetite; nausea; right upper belly pain; unusually weak or tired; yellowing of the eyes or skin -stiff neck -swelling of the ankles, feet, hands -weight gain Side effects that usually do not require medical attention (report to your doctor or health care professional if they continue or are bothersome): -bone pain -constipation -tiredness -vomiting Where should I keep my medicine? This drug is given in a hospital or clinic and will not be stored at home.  2017 Elsevier/Gold Standard (2015-04-06 09:04:36)  Dehydration, Adult Dehydration is a condition in which you do not have enough fluid or water in your body. It happens when you take in less fluid than you lose. Vital organs such as the kidneys, brain, and heart cannot function without a proper amount of fluids. Any loss of fluids from the body can cause dehydration.  Dehydration can range from mild to severe. This condition should be treated right away to help prevent it from becoming severe. CAUSES  This condition may be caused by:  Vomiting.  Diarrhea.  Excessive sweating, such as when exercising in hot or humid weather.  Not drinking enough fluid during strenuous exercise or during an illness.  Excessive urine output.  Fever.  Certain medicines. RISK FACTORS This condition is more likely to develop in:  People who are taking certain medicines that cause the body to lose excess fluid (diuretics).   People who have a chronic illness, such as diabetes, that may increase  urination.  Older adults.   People who live at high altitudes.   People who participate in endurance sports.  SYMPTOMS  Mild Dehydration  Thirst.  Dry lips.  Slightly dry mouth.  Dry, warm skin. Moderate Dehydration  Very dry mouth.   Muscle cramps.   Dark urine and decreased urine production.   Decreased tear production.   Headache.   Light-headedness, especially when you stand up from a sitting position.  Severe Dehydration  Changes in skin.   Cold and clammy skin.   Skin does not spring back quickly when lightly pinched and released.   Changes in body fluids.   Extreme thirst.   No tears.   Not able to sweat when body temperature is high, such as in hot weather.   Minimal urine production.   Changes in vital signs.   Rapid, weak pulse (more than 100 beats per minute when you are sitting still).   Rapid breathing.   Low blood pressure.   Other changes.   Sunken eyes.   Cold hands and feet.   Confusion.  Lethargy and difficulty being awakened.  Fainting (syncope).   Short-term weight loss.   Unconsciousness. DIAGNOSIS  This condition may be diagnosed based on your symptoms. You may also have tests to determine how severe your dehydration is. These tests may include:   Urine tests.   Blood tests.  TREATMENT  Treatment for this condition depends on the severity. Mild or moderate dehydration can often  be treated at home. Treatment should be started right away. Do not wait until dehydration becomes severe. Severe dehydration needs to be treated at the hospital. Treatment for Mild Dehydration  Drinking plenty of water to replace the fluid you have lost.   Replacing minerals in your blood (electrolytes) that you may have lost.  Treatment for Moderate Dehydration  Consuming oral rehydration solution (ORS). Treatment for Severe Dehydration  Receiving fluid through an IV tube.   Receiving electrolyte  solution through a feeding tube that is passed through your nose and into your stomach (nasogastric tube or NG tube).  Correcting any abnormalities in electrolytes. HOME CARE INSTRUCTIONS   Drink enough fluid to keep your urine clear or pale yellow.   Drink water or fluid slowly by taking small sips. You can also try sucking on ice cubes.  Have food or beverages that contain electrolytes. Examples include bananas and sports drinks.  Take over-the-counter and prescription medicines only as told by your health care provider.   Prepare ORS according to the manufacturer's instructions. Take sips of ORS every 5 minutes until your urine returns to normal.  If you have vomiting or diarrhea, continue to try to drink water, ORS, or both.   If you have diarrhea, avoid:   Beverages that contain caffeine.   Fruit juice.   Milk.   Carbonated soft drinks.  Do not take salt tablets. This can lead to the condition of having too much sodium in your body (hypernatremia).  SEEK MEDICAL CARE IF:  You cannot eat or drink without vomiting.  You have had moderate diarrhea during a period of more than 24 hours.  You have a fever. SEEK IMMEDIATE MEDICAL CARE IF:   You have extreme thirst.  You have severe diarrhea.  You have not urinated in 6-8 hours, or you have urinated only a small amount of very dark urine.  You have shriveled skin.  You are dizzy, confused, or both.   This information is not intended to replace advice given to you by your health care provider. Make sure you discuss any questions you have with your health care provider.   Document Released: 02/17/2005 Document Revised: 11/08/2014 Document Reviewed: 07/05/2014 Elsevier Interactive Patient Education [employer].

## 2016-02-05 NOTE — Progress Notes (Signed)
Hematology and Oncology Follow Up Visit  Derek [name] [id number] 1950-09-15 65 y.o. 02/05/2016 1:01 PM   Principle Diagnosis: 65 year old with renal cell carcinoma. He was diagnosed in August 2016 measuring 15.3 x 10.9 x 3.0 cm in the right kidney. He has retroperitoneal lymphadenopathy and a 2.7 cm liver mass indicating stage IV disease.    Prior Therapy: He is S/P right open radical nephrectomy and retroperitoneal lymph node dissection done on 11/28/2014. He also had partial right hepatectomy.  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 Metastatic lymph node involvement noted in the sampled lymph nodes. He also had involvement of renal cell carcinoma in the liver resection sample. Votrient 800 mg daily started on 05/29/2015. Therapy discontinued in July 2017 because of progression of disease. Palliative radiation therapy to the lumbar spine completed on 10/15/2015.   Current therapy:   Cabometyx 60 mg daily started in July 2017. His dose was reduced to 40 mg daily in October 2017. Nivolumab 240 mg daily first dose to start on 01/22/2016.  Interim History:  Derek [name] presents today for a follow-up visit. Since the last visit, he reports increase in his mid to lower back pain. He was seen urgently at the symptom management clinic on 02/04/2016. MRI of the spine did not show any evidence of cord compression but did show a compression fracture. He did receive intravenous hydration which helped his symptoms marginally. He reports he is taking morphine 30 mg twice a day with breakthrough Dilaudid more frequently in the last few weeks. He is ambulating less because of his back pain at this time. He does not report any neurological deficits such as weakness or neuropathy.    He tolerated the 40 mg Cabometyx dosing much better. His diarrhea have resolved and his fatigue is also improved. His appetite have been reasonable as well and his weight is  relatively stable. He also did not report any complications related to Nivolumab. He denied any excessive fatigue, dermatitis or shortness of breath.     He does not report any headaches, blurry vision, syncope or seizures. He does not report any fevers, chills, sweats. He does not report any chest pain, palpitation, orthopnea. He does not report any cough, wheezing or dyspnea on exertion. He does not report any flank pain, or hematochezia. He does not report any frequency, urgency or hesitancy. Does not report any lymphadenopathy or petechiae. Remaining review of systems unremarkable  Medications: I have reviewed the patient's current medications.  Current Outpatient Prescriptions  Medication Sig Dispense Refill  . aspirin EC 81 MG tablet Take 81 mg by mouth daily.    . cabozantinib S-Malate (CABOMETYX) 40 MG TABS Take 1 tablet by mouth daily. 30 tablet 0  . carvedilol (COREG) 12.5 MG tablet   0  . Cholecalciferol (VITAMIN D-3) 5000 UNITS TABS Take 5,000 Units by mouth daily.     . colchicine 0.6 MG tablet Reported on 08/17/2015    . diphenoxylate-atropine (LOMOTIL) 2.5-0.025 MG tablet Take 2 tablets by mouth 4 (four) times daily as needed for diarrhea or loose stools. 60 tablet 0  . docusate sodium (COLACE) 100 MG capsule Take 1 capsule (100 mg total) by mouth 2 (two) times daily. 60 capsule 0  . furosemide (LASIX) 20 MG tablet   1  . HYDROmorphone (DILAUDID) 4 MG tablet 4 mg every 2-4 hours prn pain 60 tablet 0  . KLOR-CON M20 20 MEQ tablet TAKE ONE TABLET BY MOUTH  ONCE DAILY 30 tablet 10  . lisinopril (PRINIVIL,ZESTRIL) 40 MG tablet Take 1 tablet (40 mg total) by mouth daily. 90 tablet 3  . LORazepam (ATIVAN) 0.5 MG tablet Take 0.5-1 mg by mouth at bedtime.    Marland Kitchen morphine (MS CONTIN) 30 MG 12 hr tablet Take 1 tablet (30 mg total) by mouth every 8 (eight) hours. 90 tablet 0  . Multiple Vitamin (MULTIVITAMIN) capsule Take 1 capsule by mouth daily.    Marland Kitchen omega-3 acid ethyl esters (LOVAZA) 1 g  capsule Take 1 g by mouth daily.    . ondansetron (ZOFRAN) 4 MG tablet TAKE ONE TABLET BY MOUTH THREE TIMES DAILY 20 tablet 0  . Turmeric 500 MG CAPS Take 500 mg by mouth daily.     Marland Kitchen ULORIC 40 MG tablet TAKE ONE TABLET BY MOUTH ONCE DAILY 30 tablet 1  . magic mouthwash SOLN Take 5 mLs by mouth 3 (three) times daily as needed for mouth pain. 100 mL 0   No current facility-administered medications for this visit.    Facility-Administered Medications Ordered in Other Visits  Medication Dose Route Frequency Provider Last Rate Last Dose  . 0.9 %  sodium chloride infusion   Intravenous Once Wyatt Portela, MD      . nivolumab (OPDIVO) 240 mg in sodium chloride 0.9 % 100 mL chemo infusion  240 mg Intravenous Once Wyatt Portela, MD         Allergies:  Allergies  Allergen Reactions  . Lipitor [Atorvastatin] Other (See Comments)    Myopathy...severe, with  Myoglobinuria, wheelchair bound for a few days  . Penicillins Anaphylaxis  . Prednisone Anxiety and Other (See Comments)    Extremely high blood pressure after taking medication for first time.    Past Medical History, Surgical history, Social history, and Family History were reviewed and updated.   Blood pressure (!) 108/50, pulse 72, temperature 98.4 F (36.9 C), temperature source Oral, resp. rate 16, height 5\' 8"  (1.727 m), weight 161 lb 14.4 oz (73.4 kg), SpO2 100 %. ECOG: 1 General appearance: Chronically ill-appearing gentleman appeared without major distress. Head: Normocephalic, without obvious abnormality. Dry mucous membranes noted. Neck: no adenopathy No thyroid masses. Lymph nodes: Cervical, supraclavicular, and axillary nodes normal. Heart:regular rate and rhythm, S1, S2 normal, no murmur, click, rub or gallop Lung:chest clear, no wheezing, rales, normal symmetric air entry.  Abdomin: soft, non-tender, without masses or organomegaly. No rebound or guarding. EXT:no erythema, induration, or nodules   Lab Results: Lab  Results  Component Value Date   WBC 7.2 02/04/2016   HGB 11.3 (L) 02/04/2016   HCT 34.4 (L) 02/04/2016   MCV 96.4 02/04/2016   PLT 280 02/04/2016     Chemistry      Component Value Date/Time   NA 128 (L) 02/04/2016 1116   K 5.1 02/04/2016 1116   CL 98 10/12/2015 1007   CO2 27 02/04/2016 1116   BUN 16.0 02/04/2016 1116   CREATININE 0.9 02/04/2016 1116      Component Value Date/Time   CALCIUM 9.8 02/04/2016 1116   ALKPHOS 88 02/04/2016 1116   AST 17 02/04/2016 1116   ALT 13 02/04/2016 1116   BILITOT 0.65 02/04/2016 1116     IMPRESSION: 1. Severe pathologic compression deformity of the right aspect of the L1 vertebral body. Retropulsion of the right aspect of the L1 vertebral body combined with expansile neoplasm in the right-sided pedicle results in moderate to severe canal stenosis with contact upon the conus which  is displaced leftward. No evidence for compression. 2. Multiple metastasis visible spine and sacrum. Postsurgical changes related to resection of the psoas mass. 3. No new pathologic compression deformity. 4. Lumbar degenerative changes greatest at L4-5 where there is mild canal stenosis and at L5-S1 where there is moderate bilateral foraminal narrowing.   Impression and Plan:  65 year old gentleman with the following issues:  1. 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 He is status post surgical resection in September 2016 and he is fully healed at this time.  CT scan on 05/11/2015 showed relapsed disease with a mass in the right nephrectomy bed which is undoubtedly related to kidney cancer.  CT scan on 09/10/2015 show progression of disease and currently receiving second line Cabometyx. He does have diarrhea related to this medication but currently manageable. The plan is to continue with the same dose and schedule.  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 No new areas of metastasis noted at this time.   He is currently on Cabometyx to 40 mg daily with a Nivolumab and have tolerated the combination well. The plan is to continue with the same dose and schedule and repeat imaging studies in 2 months.   2. Renal insufficiency: His creatinine is within normal range and currently her baseline.  3. Hypertension: His blood pressure is within normal range. We'll continue to monitor while he is on this current treatment.  4. L1 vertebral body involvement with tumor: Status post radiation therapy which was completed on 10/17/2015. MRI on 02/04/2016 was reviewed and showed compression deformity which could be the cause of his increased pain. I have recommended referral to interventional radiology for possible kyphoplasty and attempt to palliate his pain.   5. Pain: He continues to use long-acting morphine and I recommended to take the 30 mg dosing 3 times a day with Dilaudid breakthrough as needed. I anticipate after kyphoplasty mouth his pain might improve and he will require less.  6. Diarrhea: This is significantly improved since last visit. Not dramatically changed at this time.  7. Thyroid function monitoring: TSH will be monitored closely in this medication.  8. Prognosis: He does have incurable malignancy but his performance status remains adequate. He is experiencing slight decline in his performance status which could be related to excessive pain. For the time being we will continue with aggressive measures.  9. Follow-up: Will be in 2 weeks.   [id number], MD 12/5/20171:01 PM

## 2016-02-05 NOTE — Telephone Encounter (Signed)
Message sent to chemo scheduler to be added.  Appointments scheduled per 02/22/16 los. A copy of the AVS report and appointment schedule was given to patient, per 02/05/16 los.

## 2016-02-05 NOTE — Assessment & Plan Note (Signed)
Patient has known ill 1 vertebral metastasis.  He has been diagnosed with compression fractures at the site as well.  He presented to the [facility] today with complaint of acute onset lower back pain with radiation, specifically to the right thigh area.  Typically, patient is able to ambulate with no difficulty-but is now unable to ambulate due to the pain.  When he does stand and bear weight-he is very unsteady on his feet.  He denies any numbness or tingling.  He also denies any urine or stool incontinence.  He also denies any recent fevers or chills.  Exam today reveals patient is able to bear weight; but is very unsteady on his feet.  He does appear to have full range of motion with his bilateral legs.  There is no obvious tenderness with palpation to the lower back or to the right thigh region.  Also, there is no rash noted.  Reviewed all findings of today's exam with Dr. Burr Medico;  and she agreed should obtain a lumbar MRI for further evaluation and to rule out cord compression.  Lumbar MRI obtained late this afternoon revealed: IMPRESSION: 1. Severe pathologic compression deformity of the right aspect of the L1 vertebral body. Retropulsion of the right aspect of the L1 vertebral body combined with expansile neoplasm in the right-sided pedicle results in moderate to severe canal stenosis with contact upon the conus which is displaced leftward. No evidence for compression. 2. Multiple metastasis visible spine and sacrum. Postsurgical changes related to resection of the psoas mass. 3. No new pathologic compression deformity. 4. Lumbar degenerative changes greatest at L4-5 where there is mild canal stenosis and at L5-S1 where there is moderate bilateral foraminal narrowing.  There was initial consideration to give patient Decadron/steroids for his discomfort; but patient states that he is allergic to all types of steroids.  He states that the last time he received steroids.  He proceeded to  have an MI and then heart surgery.  Confirm the patient does have both long-acting and breakthrough pain medication at home to take on a regular basis.  Reviewed all findings of the MRI results with Dr. Burr Medico as well.  The plan is for the patient to return tomorrow to review all once again with Dr. Alen Blew.  He was advised to go directly to the emergency department overnight for any worsening symptoms whatsoever.

## 2016-02-06 ENCOUNTER — Telehealth: Payer: Self-pay | Admitting: *Deleted

## 2016-02-06 MED FILL — CMPD NYS:DPH:LIDO 1:1:1: 8 days supply | Qty: 120 | Fill #0

## 2016-02-06 NOTE — Telephone Encounter (Signed)
Wife states patient cannot use magic mouthwash, d/t steroid in it. Per dr Alen Blew, okay to order minus the steroid. Done. Wife notified.

## 2016-02-07 ENCOUNTER — Telehealth: Payer: Self-pay | Admitting: *Deleted

## 2016-02-07 NOTE — Telephone Encounter (Signed)
Spoke with jennifer in [facility]. She will call [facility] to set up consult for kyphoplasty.

## 2016-02-07 NOTE — Telephone Encounter (Signed)
error 

## 2016-02-08 ENCOUNTER — Inpatient Hospital Stay (HOSPITAL_COMMUNITY): Payer: Medicare Other

## 2016-02-08 ENCOUNTER — Inpatient Hospital Stay (HOSPITAL_COMMUNITY)
Admission: EM | Admit: 2016-02-08 | Discharge: 2016-02-16 | DRG: 393 | Disposition: A | Discharge status: Home Health | Payer: Medicare Other | Attending: Family Medicine | Admitting: Family Medicine

## 2016-02-08 ENCOUNTER — Encounter (HOSPITAL_COMMUNITY): Payer: Self-pay | Admitting: Emergency Medicine

## 2016-02-08 DIAGNOSIS — K6819 Other retroperitoneal abscess: Secondary | ICD-10-CM | POA: Diagnosis present

## 2016-02-08 DIAGNOSIS — Z7189 Other specified counseling: Secondary | ICD-10-CM | POA: Diagnosis not present

## 2016-02-08 DIAGNOSIS — T402X5A Adverse effect of other opioids, initial encounter: Secondary | ICD-10-CM | POA: Diagnosis present

## 2016-02-08 DIAGNOSIS — C649 Malignant neoplasm of unspecified kidney, except renal pelvis: Secondary | ICD-10-CM | POA: Diagnosis not present

## 2016-02-08 DIAGNOSIS — Z951 Presence of aortocoronary bypass graft: Secondary | ICD-10-CM

## 2016-02-08 DIAGNOSIS — E871 Hypo-osmolality and hyponatremia: Secondary | ICD-10-CM | POA: Diagnosis present

## 2016-02-08 DIAGNOSIS — N320 Bladder-neck obstruction: Secondary | ICD-10-CM | POA: Diagnosis present

## 2016-02-08 DIAGNOSIS — K631 Perforation of intestine (nontraumatic): Secondary | ICD-10-CM | POA: Diagnosis not present

## 2016-02-08 DIAGNOSIS — R627 Adult failure to thrive: Secondary | ICD-10-CM | POA: Diagnosis not present

## 2016-02-08 DIAGNOSIS — K922 Gastrointestinal hemorrhage, unspecified: Secondary | ICD-10-CM

## 2016-02-08 DIAGNOSIS — M8448XA Pathological fracture, other site, initial encounter for fracture: Secondary | ICD-10-CM | POA: Diagnosis not present

## 2016-02-08 DIAGNOSIS — Z801 Family history of malignant neoplasm of trachea, bronchus and lung: Secondary | ICD-10-CM

## 2016-02-08 DIAGNOSIS — Z888 Allergy status to other drugs, medicaments and biological substances status: Secondary | ICD-10-CM

## 2016-02-08 DIAGNOSIS — I959 Hypotension, unspecified: Secondary | ICD-10-CM | POA: Diagnosis present

## 2016-02-08 DIAGNOSIS — C7951 Secondary malignant neoplasm of bone: Secondary | ICD-10-CM | POA: Diagnosis not present

## 2016-02-08 DIAGNOSIS — K121 Other forms of stomatitis: Secondary | ICD-10-CM | POA: Diagnosis present

## 2016-02-08 DIAGNOSIS — Z6824 Body mass index (BMI) 24.0-24.9, adult: Secondary | ICD-10-CM | POA: Diagnosis not present

## 2016-02-08 DIAGNOSIS — K625 Hemorrhage of anus and rectum: Secondary | ICD-10-CM

## 2016-02-08 DIAGNOSIS — Z515 Encounter for palliative care: Secondary | ICD-10-CM | POA: Diagnosis not present

## 2016-02-08 DIAGNOSIS — F4320 Adjustment disorder, unspecified: Secondary | ICD-10-CM | POA: Diagnosis present

## 2016-02-08 DIAGNOSIS — R933 Abnormal findings on diagnostic imaging of other parts of digestive tract: Secondary | ICD-10-CM | POA: Diagnosis not present

## 2016-02-08 DIAGNOSIS — K649 Unspecified hemorrhoids: Secondary | ICD-10-CM | POA: Diagnosis not present

## 2016-02-08 DIAGNOSIS — E860 Dehydration: Secondary | ICD-10-CM | POA: Diagnosis not present

## 2016-02-08 DIAGNOSIS — I10 Essential (primary) hypertension: Secondary | ICD-10-CM

## 2016-02-08 DIAGNOSIS — M109 Gout, unspecified: Secondary | ICD-10-CM | POA: Diagnosis present

## 2016-02-08 DIAGNOSIS — B957 Other staphylococcus as the cause of diseases classified elsewhere: Secondary | ICD-10-CM | POA: Diagnosis present

## 2016-02-08 DIAGNOSIS — E43 Unspecified severe protein-calorie malnutrition: Secondary | ICD-10-CM | POA: Diagnosis not present

## 2016-02-08 DIAGNOSIS — E46 Unspecified protein-calorie malnutrition: Secondary | ICD-10-CM | POA: Diagnosis present

## 2016-02-08 DIAGNOSIS — M549 Dorsalgia, unspecified: Secondary | ICD-10-CM

## 2016-02-08 DIAGNOSIS — R7303 Prediabetes: Secondary | ICD-10-CM | POA: Diagnosis present

## 2016-02-08 DIAGNOSIS — D62 Acute posthemorrhagic anemia: Secondary | ICD-10-CM | POA: Diagnosis present

## 2016-02-08 DIAGNOSIS — E785 Hyperlipidemia, unspecified: Secondary | ICD-10-CM | POA: Diagnosis present

## 2016-02-08 DIAGNOSIS — Z880 Allergy status to penicillin: Secondary | ICD-10-CM

## 2016-02-08 DIAGNOSIS — I2510 Atherosclerotic heart disease of native coronary artery without angina pectoris: Secondary | ICD-10-CM | POA: Diagnosis present

## 2016-02-08 DIAGNOSIS — Z79899 Other long term (current) drug therapy: Secondary | ICD-10-CM

## 2016-02-08 DIAGNOSIS — I252 Old myocardial infarction: Secondary | ICD-10-CM

## 2016-02-08 DIAGNOSIS — Z808 Family history of malignant neoplasm of other organs or systems: Secondary | ICD-10-CM

## 2016-02-08 DIAGNOSIS — Z9221 Personal history of antineoplastic chemotherapy: Secondary | ICD-10-CM

## 2016-02-08 DIAGNOSIS — K632 Fistula of intestine: Secondary | ICD-10-CM | POA: Diagnosis present

## 2016-02-08 DIAGNOSIS — K7689 Other specified diseases of liver: Secondary | ICD-10-CM | POA: Diagnosis not present

## 2016-02-08 DIAGNOSIS — R1310 Dysphagia, unspecified: Secondary | ICD-10-CM | POA: Diagnosis not present

## 2016-02-08 DIAGNOSIS — R634 Abnormal weight loss: Secondary | ICD-10-CM | POA: Diagnosis present

## 2016-02-08 DIAGNOSIS — Z905 Acquired absence of kidney: Secondary | ICD-10-CM

## 2016-02-08 DIAGNOSIS — K668 Other specified disorders of peritoneum: Secondary | ICD-10-CM

## 2016-02-08 DIAGNOSIS — R531 Weakness: Secondary | ICD-10-CM | POA: Diagnosis not present

## 2016-02-08 DIAGNOSIS — G893 Neoplasm related pain (acute) (chronic): Secondary | ICD-10-CM | POA: Diagnosis not present

## 2016-02-08 DIAGNOSIS — R188 Other ascites: Secondary | ICD-10-CM

## 2016-02-08 DIAGNOSIS — M545 Low back pain: Secondary | ICD-10-CM | POA: Diagnosis present

## 2016-02-08 DIAGNOSIS — Z7982 Long term (current) use of aspirin: Secondary | ICD-10-CM

## 2016-02-08 DIAGNOSIS — M48061 Spinal stenosis, lumbar region without neurogenic claudication: Secondary | ICD-10-CM | POA: Diagnosis not present

## 2016-02-08 DIAGNOSIS — R8271 Bacteriuria: Secondary | ICD-10-CM | POA: Diagnosis present

## 2016-02-08 DIAGNOSIS — K5903 Drug induced constipation: Secondary | ICD-10-CM | POA: Diagnosis present

## 2016-02-08 DIAGNOSIS — C7949 Secondary malignant neoplasm of other parts of nervous system: Secondary | ICD-10-CM | POA: Diagnosis not present

## 2016-02-08 DIAGNOSIS — M8458XA Pathological fracture in neoplastic disease, other specified site, initial encounter for fracture: Secondary | ICD-10-CM | POA: Diagnosis present

## 2016-02-08 DIAGNOSIS — M4856XA Collapsed vertebra, not elsewhere classified, lumbar region, initial encounter for fracture: Secondary | ICD-10-CM | POA: Diagnosis not present

## 2016-02-08 DIAGNOSIS — R404 Transient alteration of awareness: Secondary | ICD-10-CM | POA: Diagnosis not present

## 2016-02-08 DIAGNOSIS — L0291 Cutaneous abscess, unspecified: Secondary | ICD-10-CM | POA: Diagnosis not present

## 2016-02-08 DIAGNOSIS — Z923 Personal history of irradiation: Secondary | ICD-10-CM

## 2016-02-08 DIAGNOSIS — C801 Malignant (primary) neoplasm, unspecified: Secondary | ICD-10-CM | POA: Diagnosis not present

## 2016-02-08 DIAGNOSIS — R198 Other specified symptoms and signs involving the digestive system and abdomen: Secondary | ICD-10-CM | POA: Diagnosis not present

## 2016-02-08 DIAGNOSIS — Z85528 Personal history of other malignant neoplasm of kidney: Secondary | ICD-10-CM | POA: Diagnosis not present

## 2016-02-08 DIAGNOSIS — D649 Anemia, unspecified: Secondary | ICD-10-CM | POA: Diagnosis not present

## 2016-02-08 DIAGNOSIS — Z79891 Long term (current) use of opiate analgesic: Secondary | ICD-10-CM

## 2016-02-08 DIAGNOSIS — K921 Melena: Secondary | ICD-10-CM | POA: Diagnosis not present

## 2016-02-08 LAB — CBC WITH DIFFERENTIAL/PLATELET
Basophils Absolute: 0 10*3/uL (ref 0.0–0.1)
Basophils Relative: 0 %
EOS ABS: 0 10*3/uL (ref 0.0–0.7)
Eosinophils Relative: 0 %
HEMATOCRIT: 24.6 % — AB (ref 39.0–52.0)
HEMOGLOBIN: 8.6 g/dL — AB (ref 13.0–17.0)
LYMPHS ABS: 0.3 10*3/uL — AB (ref 0.7–4.0)
LYMPHS PCT: 4 %
MCH: 32.3 pg (ref 26.0–34.0)
MCHC: 35 g/dL (ref 30.0–36.0)
MCV: 92.5 fL (ref 78.0–100.0)
MONOS PCT: 5 %
Monocytes Absolute: 0.5 10*3/uL (ref 0.1–1.0)
NEUTROS ABS: 8.5 10*3/uL — AB (ref 1.7–7.7)
NEUTROS PCT: 91 %
Platelets: 277 10*3/uL (ref 150–400)
RBC: 2.66 MIL/uL — AB (ref 4.22–5.81)
RDW: 14.1 % (ref 11.5–15.5)
WBC: 9.4 10*3/uL (ref 4.0–10.5)

## 2016-02-08 LAB — URINALYSIS, ROUTINE W REFLEX MICROSCOPIC
Bilirubin Urine: NEGATIVE
GLUCOSE, UA: NEGATIVE mg/dL
KETONES UR: 20 mg/dL — AB
LEUKOCYTES UA: NEGATIVE
NITRITE: NEGATIVE
PH: 5 (ref 5.0–8.0)
PROTEIN: NEGATIVE mg/dL
Specific Gravity, Urine: >1.046 — ABNORMAL HIGH (ref 1.005–1.030)

## 2016-02-08 LAB — COMPREHENSIVE METABOLIC PANEL
ALT: 14 U/L — AB (ref 17–63)
AST: 19 U/L (ref 15–41)
Albumin: 2.3 g/dL — ABNORMAL LOW (ref 3.5–5.0)
Alkaline Phosphatase: 75 U/L (ref 38–126)
Anion gap: 10 (ref 5–15)
BUN: 25 mg/dL — ABNORMAL HIGH (ref 6–20)
CHLORIDE: 91 mmol/L — AB (ref 101–111)
CO2: 26 mmol/L (ref 22–32)
CREATININE: 0.92 mg/dL (ref 0.61–1.24)
Calcium: 8 mg/dL — ABNORMAL LOW (ref 8.9–10.3)
GFR calc non Af Amer: >60 mL/min (ref >60)
Glucose, Bld: 135 mg/dL — ABNORMAL HIGH (ref 65–99)
POTASSIUM: 4.4 mmol/L (ref 3.5–5.1)
SODIUM: 127 mmol/L — AB (ref 135–145)
Total Bilirubin: 0.7 mg/dL (ref 0.3–1.2)
Total Protein: 5.9 g/dL — ABNORMAL LOW (ref 6.5–8.1)

## 2016-02-08 LAB — HEMOGLOBIN AND HEMATOCRIT, BLOOD
HCT: 24.2 % — ABNORMAL LOW (ref 39.0–52.0)
HCT: 24.4 % — ABNORMAL LOW (ref 39.0–52.0)
HEMATOCRIT: 23.2 % — AB (ref 39.0–52.0)
HEMOGLOBIN: 8.6 g/dL — AB (ref 13.0–17.0)
Hemoglobin: 8.5 g/dL — ABNORMAL LOW (ref 13.0–17.0)
Hemoglobin: 8.2 g/dL — ABNORMAL LOW (ref 13.0–17.0)

## 2016-02-08 LAB — TYPE AND SCREEN
ABO/RH(D): AB POS
Antibody Screen: NEGATIVE

## 2016-02-08 LAB — I-STAT CG4 LACTIC ACID, ED: Lactic Acid, Venous: 1.51 mmol/L (ref 0.5–1.9)

## 2016-02-08 LAB — POC OCCULT BLOOD, ED: Fecal Occult Bld: POSITIVE — AB

## 2016-02-08 LAB — ABO/RH: ABO/RH(D): AB POS

## 2016-02-08 MED ORDER — SODIUM CHLORIDE 0.9% FLUSH
3.0000 mL | Freq: Two times a day (BID) | INTRAVENOUS | Status: DC
Start: 2016-02-08 — End: 2016-02-09
  Administered 2016-02-08: 3 mL via INTRAVENOUS

## 2016-02-08 MED ORDER — MULTIVITAMINS PO CAPS: 1.0000 | ORAL_CAPSULE | Freq: Every day | ORAL | Status: DC

## 2016-02-08 MED ORDER — DIPHENHYDRAMINE HCL 25 MG PO CAPS
25.0000 mg | ORAL_CAPSULE | Freq: Once | ORAL | Status: AC
  Administered 2016-02-08: 25 mg via ORAL
  Filled 2016-02-08: qty 1

## 2016-02-08 MED ORDER — LORAZEPAM 0.5 MG PO TABS: 0.5000 mg | ORAL_TABLET | Freq: Every day | ORAL | Status: DC

## 2016-02-08 MED ORDER — MORPHINE SULFATE ER 30 MG PO TBCR
30.0000 mg | EXTENDED_RELEASE_TABLET | Freq: Three times a day (TID) | ORAL | Status: DC
  Administered 2016-02-08 – 2016-02-09 (×3): 30 mg via ORAL
  Filled 2016-02-08 (×3): qty 1

## 2016-02-08 MED ORDER — FEBUXOSTAT 40 MG PO TABS
40.0000 mg | ORAL_TABLET | Freq: Every day | ORAL | Status: DC
  Administered 2016-02-08 – 2016-02-16 (×7): 40 mg via ORAL
  Filled 2016-02-08 (×9): qty 1

## 2016-02-08 MED ORDER — ONDANSETRON HCL 4 MG PO TABS: 4.0000 mg | ORAL_TABLET | Freq: Four times a day (QID) | ORAL | Status: DC | PRN

## 2016-02-08 MED ORDER — CABOZANTINIB S-MALATE 40 MG PO TABS
40.0000 mg | ORAL_TABLET | Freq: Every day | ORAL | Status: DC
  Administered 2016-02-08 – 2016-02-09 (×2): 40 mg via ORAL

## 2016-02-08 MED ORDER — OXYBUTYNIN CHLORIDE 5 MG PO TABS
2.5000 mg | ORAL_TABLET | Freq: Three times a day (TID) | ORAL | Status: DC | PRN
  Administered 2016-02-08: 2.5 mg via ORAL
  Filled 2016-02-08: qty 1

## 2016-02-08 MED ORDER — SODIUM CHLORIDE 0.9 % IV SOLN: INTRAVENOUS | Status: AC

## 2016-02-08 MED ORDER — MAGIC MOUTHWASH
5.0000 mL | Freq: Three times a day (TID) | ORAL | Status: DC | PRN
  Filled 2016-02-08: qty 5

## 2016-02-08 MED ORDER — SODIUM CHLORIDE 0.9 % IV SOLN
INTRAVENOUS | Status: DC
  Administered 2016-02-08 (×2): via INTRAVENOUS
  Administered 2016-02-09: 75 mL/h via INTRAVENOUS
  Administered 2016-02-10 (×2): via INTRAVENOUS
  Administered 2016-02-11: 75 mL/h via INTRAVENOUS
  Administered 2016-02-12 – 2016-02-14 (×3): via INTRAVENOUS

## 2016-02-08 MED ORDER — ACETAMINOPHEN 325 MG PO TABS: 650.0000 mg | ORAL_TABLET | Freq: Four times a day (QID) | ORAL | Status: DC | PRN

## 2016-02-08 MED ORDER — OMEGA-3-ACID ETHYL ESTERS 1 G PO CAPS
1.0000 g | ORAL_CAPSULE | Freq: Every day | ORAL | Status: DC
  Filled 2016-02-08 (×2): qty 1

## 2016-02-08 MED ORDER — LORAZEPAM 0.5 MG PO TABS
0.5000 mg | ORAL_TABLET | Freq: Every day | ORAL | Status: DC
  Administered 2016-02-08 – 2016-02-15 (×3): 0.5 mg via ORAL
  Filled 2016-02-08 (×6): qty 1

## 2016-02-08 MED ORDER — ACETAMINOPHEN 650 MG RE SUPP: 650.0000 mg | Freq: Four times a day (QID) | RECTAL | Status: DC | PRN

## 2016-02-08 MED ORDER — ADULT MULTIVITAMIN W/MINERALS CH
1.0000 | ORAL_TABLET | Freq: Every day | ORAL | Status: DC
  Filled 2016-02-08: qty 1

## 2016-02-08 MED ORDER — BOOST / RESOURCE BREEZE PO LIQD
1.0000 | Freq: Three times a day (TID) | ORAL | Status: DC
  Administered 2016-02-08 – 2016-02-09 (×2): 1 via ORAL

## 2016-02-08 MED ORDER — ONDANSETRON HCL 4 MG/2ML IJ SOLN
4.0000 mg | Freq: Four times a day (QID) | INTRAMUSCULAR | Status: DC | PRN
Start: 2016-02-08 — End: 2016-02-16

## 2016-02-08 MED ORDER — DIPHENOXYLATE-ATROPINE 2.5-0.025 MG PO TABS: 2.0000 | ORAL_TABLET | Freq: Four times a day (QID) | ORAL | Status: DC | PRN

## 2016-02-08 MED ORDER — POLYETHYLENE GLYCOL 3350 17 G PO PACK: 17.0000 g | PACK | Freq: Every day | ORAL | Status: DC | PRN

## 2016-02-08 MED ORDER — LORAZEPAM 2 MG/ML IJ SOLN: 0.5000 mg | Freq: Once | INTRAMUSCULAR | Status: AC

## 2016-02-08 MED ORDER — LORAZEPAM 0.5 MG PO TABS
0.5000 mg | ORAL_TABLET | Freq: Once | ORAL | Status: AC
  Administered 2016-02-08: 0.5 mg via ORAL
  Filled 2016-02-08: qty 1

## 2016-02-08 MED ORDER — HYDROMORPHONE HCL 1 MG/ML IJ SOLN
1.0000 mg | INTRAMUSCULAR | Status: DC | PRN
  Administered 2016-02-08 (×2): 1 mg via INTRAVENOUS
  Filled 2016-02-08 (×2): qty 1

## 2016-02-08 MED ORDER — HYDROMORPHONE HCL 2 MG/ML IJ SOLN
2.0000 mg | INTRAMUSCULAR | Status: DC | PRN
  Administered 2016-02-08 – 2016-02-11 (×17): 2 mg via INTRAVENOUS
  Filled 2016-02-08 (×17): qty 1

## 2016-02-08 MED ORDER — IOPAMIDOL (ISOVUE-300) INJECTION 61%
100.0000 mL | Freq: Once | INTRAVENOUS | Status: AC | PRN
  Administered 2016-02-08: 100 mL via INTRAVENOUS

## 2016-02-08 MED ORDER — DOCUSATE SODIUM 100 MG PO CAPS
100.0000 mg | ORAL_CAPSULE | Freq: Two times a day (BID) | ORAL | Status: DC
  Administered 2016-02-10 – 2016-02-15 (×7): 100 mg via ORAL
  Filled 2016-02-08 (×12): qty 1

## 2016-02-08 MED ORDER — PANTOPRAZOLE SODIUM 40 MG IV SOLR
40.0000 mg | Freq: Two times a day (BID) | INTRAVENOUS | Status: DC
  Administered 2016-02-08 – 2016-02-13 (×12): 40 mg via INTRAVENOUS
  Filled 2016-02-08 (×13): qty 40

## 2016-02-08 NOTE — ED Notes (Signed)
Bed: [id number] Expected date:  Expected time:  Means of arrival:  Comments: EMS 65 yo male with rectal bleeding BRB since 2000 last night

## 2016-02-08 NOTE — ED Triage Notes (Signed)
Pt BIB EMS c/o weakness and rectal bleeding since last night; pt noticed loose and bright red blood mixed with his stool; denies abdominal pain, N/V; hx of renal cancer

## 2016-02-08 NOTE — Consult Note (Signed)
[name] Consultation Note  Referring Provider: Dr. Estill Cotta [facility]) Primary Care Physician:  Harvie Heck Iris Pert  Reason for Consultation:  GI bleeding, anemia  HPI: Derek [name] is a 65 y.o. male whom we've been asked to see for GI bleeding.  Patient with history of metastatic renal cell cancer with bone, hepatic flexure, and psoas muscle metastases.  On active chemotherapy.  Has worsening back pain from compression fractures, kyphoplasty in the works but not scheduled yet.  Significant nausea and poor appetite from narcotics, has some mouth irritation and odynophagia.  No abdominal pain.  Had episode of rampant hematochezia early this morning.  No prior GI bleeding.  No straining or reported constipation.  No prior colonoscopy.  ON aspirin but no other antiplatelets or blood thinners.   Past Medical History:  Diagnosis Date  . Arthritis   . Bone cancer ([place])    rcc with L1 pathologic compression fracture  . Cancer [facility])    rcc with L1 pathologic compression fx.  . Gout   . Hypertension   . NSTEMI (non-ST elevated myocardial infarction) ([place])   . Renal cell carcinoma ([place]) 11/28/2014  . Renal cell carcinoma ([place])   . Shingles 7/15    Past Surgical History:  Procedure Laterality Date  . CORONARY ARTERY BYPASS GRAFT N/A 01/27/2014   Procedure: CORONARY ARTERY BYPASS GRAFTING (CABG), ON PUMP, TIMES FOUR, USING LEFT INTERNAL MAMMARY ARTERY, RIGHT GREATER SAPHENOUS VEIN HARVESTED ENDOSCOPICALLY;  Surgeon: Gaye Pollack, MD;  Location: [place];  Service: Open Heart Surgery;  Laterality: N/A;  LIMA-LAD; SVG-OM; SVG-PD; SVG-DIAG  . KNEE SURGERY    . LEFT HEART CATHETERIZATION WITH CORONARY ANGIOGRAM N/A 01/23/2014   Procedure: LEFT HEART CATHETERIZATION WITH CORONARY ANGIOGRAM;  Surgeon: Blane Ohara, MD;  Location: [facility] CATH LAB;  Service: Cardiovascular;  Laterality: N/A;  . NEPHRECTOMY Right 11/28/2014   Procedure: RIGHT OPEN NEPHRECTOMY;  Surgeon: Cleon Gustin, MD;   Location: [place];  Service: Urology;  Laterality: Right;  . OPEN PARTIAL HEPATECTOMY  N/A 11/28/2014   Procedure: OPEN PARTIAL HEPATECTOMY;  Surgeon: [name], MD;  Location: [place];  Service: General;  Laterality: N/A;  . TEE WITHOUT CARDIOVERSION N/A 01/27/2014   Procedure: TRANSESOPHAGEAL ECHOCARDIOGRAM (TEE);  Surgeon: Gaye Pollack, MD;  Location: [place];  Service: Open Heart Surgery;  Laterality: N/A;    Prior to Admission medications   Medication Sig Start Date End Date Taking? Authorizing Provider  aspirin EC 81 MG tablet Take 81 mg by mouth daily.   Yes Historical Provider, MD  cabozantinib S-Malate (CABOMETYX) 40 MG TABS Take 1 tablet by mouth daily. 01/22/16  Yes Wyatt Portela, MD  carvedilol (COREG) 12.5 MG tablet Take 12.5 mg by mouth 2 (two) times daily with a meal.  08/17/15  Yes Historical Provider, MD  Cholecalciferol (VITAMIN D-3) 5000 UNITS TABS Take 5,000 Units by mouth daily.    Yes Historical Provider, MD  colchicine 0.6 MG tablet Reported on 08/17/2015   Yes Historical Provider, MD  diphenoxylate-atropine (LOMOTIL) 2.5-0.025 MG tablet Take 2 tablets by mouth 4 (four) times daily as needed for diarrhea or loose stools. 12/06/15  Yes Susanne Borders, NP  docusate sodium (COLACE) 100 MG capsule Take 1 capsule (100 mg total) by mouth 2 (two) times daily. 12/01/14  Yes Cleon Gustin, MD  HYDROmorphone (DILAUDID) 4 MG tablet 4 mg every 2-4 hours prn pain 12/26/15  Yes Wyatt Portela, MD  KLOR-CON M20 20 MEQ tablet TAKE ONE TABLET  BY MOUTH ONCE DAILY 10/08/15  Yes [name], MD  lisinopril (PRINIVIL,ZESTRIL) 40 MG tablet Take 1 tablet (40 mg total) by mouth daily. 09/21/15  Yes [name], MD  LORazepam (ATIVAN) 0.5 MG tablet Take 0.5-1 mg by mouth at bedtime.   Yes Historical Provider, MD  magic mouthwash SOLN Take 5 mLs by mouth 3 (three) times daily as needed for mouth pain. 02/05/16  Yes Wyatt Portela, MD  morphine (MS CONTIN) 30 MG 12 hr tablet Take 1 tablet (30  mg total) by mouth every 8 (eight) hours. 12/26/15  Yes Wyatt Portela, MD  Multiple Vitamin (MULTIVITAMIN) capsule Take 1 capsule by mouth daily.   Yes Historical Provider, MD  omega-3 acid ethyl esters (LOVAZA) 1 g capsule Take 1 g by mouth daily.   Yes Historical Provider, MD  Turmeric 500 MG CAPS Take 500 mg by mouth daily.    Yes Historical Provider, MD  ULORIC 40 MG tablet TAKE ONE TABLET BY MOUTH ONCE DAILY 01/08/16  Yes Gay Filler Copland, MD  ondansetron (ZOFRAN) 4 MG tablet TAKE ONE TABLET BY MOUTH THREE TIMES DAILY Patient not taking: Reported on 02/08/2016 10/21/15   Tyler Pita, MD    Current Facility-Administered Medications  Medication Dose Route Frequency Provider Last Rate Last Dose  . feeding supplement (BOOST / RESOURCE BREEZE) liquid 1 Container  1 Container Oral TID BM Ripudeep Krystal Eaton, MD        Allergies as of 02/08/2016 - Review Complete 02/08/2016  Allergen Reaction Noted  . Lipitor [atorvastatin] Other (See Comments) 03/08/2014  . Penicillins Anaphylaxis 01/09/2014  . Prednisone Anxiety and Other (See Comments) 03/09/2014    Family History  Problem Relation Age of Onset  . Lung cancer Mother   . Lung cancer Father   . Bone cancer Maternal Grandfather     Social History   Social History  . Marital status: Married    Spouse name: N/A  . Number of children: N/A  . Years of education: N/A   Occupational History  . Dentist    Social History Main Topics  . Smoking status: Never Smoker  . Smokeless tobacco: Never Used  . Alcohol use Yes     Comment: 1-2 drinks/week  . Drug use: No  . Sexual activity: No   Other Topics Concern  . Not on file   Social History Narrative   Lives with wife    Review of Systems: Positive = bold Gen: Denies any fever, chills, rigors, night sweats, anorexia, fatigue, weakness, malaise, involuntary weight loss, and sleep disorder CV: Denies chest pain, angina, palpitations, syncope, orthopnea, PND, peripheral edema, and  claudication. Resp: Denies dyspnea, cough, sputum, wheezing, coughing up blood. GI: Described in detail in HPI.    GU : Denies urinary burning, blood in urine, urinary frequency, urinary hesitancy, nocturnal urination, and urinary incontinence. MS: Denies joint pain or swelling.  Denies muscle weakness, cramps, atrophy.  Derm: Denies rash, itching, oral ulcerations, hives, unhealing ulcers.  Psych: Denies depression, anxiety, memory loss, suicidal ideation, hallucinations,  and confusion. Heme: Denies bruising, bleeding, and enlarged lymph nodes. Neuro:  Denies any headaches, dizziness, paresthesias. Endo:  Denies any problems with DM, thyroid, adrenal function.  Physical Exam: Vital signs in last 24 hours: Temp:  [99.7 F (37.6 C)] 99.7 F (37.6 C) (12/08 0645) Pulse Rate:  [66-72] 67 (12/08 0930) Resp:  [12-21] 21 (12/08 0930) BP: (95-120)/(52-72) 97/58 (12/08 0930) SpO2:  [89 %-100 %] 96 % (12/08 0930) Weight:  [  73 kg (161 lb)] 73 kg (161 lb) (12/08 0640)   General:  Somnolent but arousable, pale, in NAD Head:  Normocephalic and atraumatic. Eyes:  Sclera clear, no icterus.   Conjunctiva pale Ears:  Normal auditory acuity. Nose:  No deformity, discharge,  or lesions. Mouth:  No deformity or lesions.  Oropharynx pale and dry Neck:  Supple; no masses or thyromegaly. Lungs:  Clear throughout to auscultation.   No wheezes, crackles, or rhonchi. No acute distress. Heart:  Regular rate and rhythm; no murmurs, clicks, rubs,  or gallops. Abdomen:  Soft, nontender and nondistended. No masses, hepatosplenomegaly or hernias noted. Normal bowel sounds, without guarding, and without rebound.     Msk:  Symmetrical without gross deformities. Normal posture. Pulses:  Normal pulses noted. Extremities:  Without clubbing or edema. Neurologic:  Alert and  oriented x4;  grossly normal neurologically. Skin:  Pale otherwise intact without significant lesions or rashes. Psych:  Somnolent but  arousable, depressed mood, flat affect   Lab Results:  Recent Labs  02/08/16 0716  WBC 9.4  HGB 8.6*  HCT 24.6*  PLT 277   BMET  Recent Labs  02/08/16 0716  NA 127*  K 4.4  CL 91*  CO2 26  GLUCOSE 135*  BUN 25*  CREATININE 0.92  CALCIUM 8.0*   LFT  Recent Labs  02/08/16 0716  PROT 5.9*  ALBUMIN 2.3*  AST 19  ALT 14*  ALKPHOS 75  BILITOT 0.7   PT/INR No results for input(s): LABPROT, INR in the last 72 hours.  Studies/Results: No results found.  Impression:  1.  Blood in stool. 2.  Acute blood loss anemia. 3.  Metastatic renal cell cancer (metastatic to psoas muscle, hepatic flexure, bones).  Ongoing treatment. 4.  Poor appetite and odynophagia, in setting of recent chemotherapy. 5.  Pathologic lumbar compression fractures with significant pain.  Plan:  1.  Patient might ultimately need colonoscopy, but doubt patient would tolerate this at the present time (given severe back pain, poor mobility, both of which would hamper bowel prep process; unclear if he could drink the prep now with his suspected esophagitis and mucositis. 2.  Magic Mouthwash as needed.  Sucralfate suspension as needed. 3.  Top priority at this time likely trying to get patient's anticipated kyphoplasty expedited.  Once this is done and swallowing is improved and back pain has improved, would then consider colonoscopy (inpatient versus outpatient). 4.  Follow serial CBCs and clinical course.  If Hgb continues to drop without overt GI tract bleeding, might consider CT scan to assess for retroperitoneal bleeding, given patient's history of psoas muscle involvement from renal cell tumor.  Would not do anything acutely GI tract wise, unless patient has overt recurrent and destabilizing bleeding, in which case might consider tagged RBC as next step in management. 5.  [facility] will follow.   LOS: 0 days   Nicole Defino M  02/08/2016, 10:15 AM  Pager [phone] If no answer or after 5 PM  call [phone]

## 2016-02-08 NOTE — ED Triage Notes (Signed)
Patient complaining of rectal bleeding that started last night around 8 pm. Patient states he is gradually getting weak. Patient is a cancer patient.

## 2016-02-08 NOTE — H&P (Addendum)
History and Physical        Hospital Admission Note Date: 02/08/2016  Patient name: Derek [name] Medical record number: [id number] Date of birth: 1950-10-14 Age: 65 y.o. Gender: male  PCP: Elise Benne   Referring physician: Dr Roxanne Mins   Patient coming from: home    Chief Complaint:  Rectal bleeding   HPI: Patient is a 65 year old male with metastatic renal cell carcinoma, hypertension, CAD, recent L1 pathological compression fracture with acute back pain presented to ED with rectal bleeding. Patient has been having intractable lower back pain due to L1 compression fracture and had been taking Dilaudid and long-acting morphine, mostly bedbound in the last 1 week. Family had been trying to schedule outpatient kyphoplasty with Dr. Estanislado Pandy. This morning, patient woke up around 4:30am with bright red blood in the stool, subsequently had 2 more episodes. Per patient's son, he had 2 syncopal episodes, has been feeling dizzy and lightheaded since then. Patient is on aspirin 81 mg daily, not on any anticoagulation or NSAIDS. Patient denies any abdominal pain nausea or vomiting. Per family patient has been nonambulatory in the last 1 week due to acute back pain from newly diagnosed L1 pathological compression fracture. 10 days ago he was ambulating with a cane and had gone on a cruise.  ED work-up/course:  Temp 99.7 respiratory rate 15 pulse 72 blood pressure 110/68 O2 sats 99% on room air. Sodium 127 BUN 25, creatinine 0.92 albumin 2.3, lactic acid 1.5 WBC 9.4, hemoglobin 8.6, platelets 277. Hemoglobin on 12/4 was 11.3  Review of Systems: Positives marked in 'bold' Constitutional: Denies fever, chills, diaphoresis, poor appetite and fatigue.  HEENT: Denies photophobia, eye pain, redness, hearing loss, ear pain, congestion, sore throat, rhinorrhea, sneezing, mouth sores, trouble  swallowing, neck pain, neck stiffness and tinnitus.   Respiratory: Denies SOB, DOE, cough, chest tightness,  and wheezing.   Cardiovascular: Denies chest pain, palpitations and leg swelling.  Gastrointestinal: Denies nausea, vomiting, abdominal pain, diarrhea, constipation, blood in stool and abdominal distention.  Genitourinary: Denies dysuria, urgency, frequency, hematuria, flank pain and difficulty urinating.  Musculoskeletal: Denies myalgias, back pain, joint swelling, arthralgias and gait problem.  Skin: Denies pallor, rash and wound.  Neurological: Denies seizures,weakness numbness and headaches. + dizziness, lightheadedness, passed out today Hematological: Denies adenopathy. Easy bruising, personal or family bleeding history  Psychiatric/Behavioral: Denies suicidal ideation, mood changes, confusion, nervousness, sleep disturbance and agitation  Past Medical History: Past Medical History:  Diagnosis Date  . Arthritis   . Bone cancer ([place])    rcc with L1 pathologic compression fracture  . Cancer [facility])    rcc with L1 pathologic compression fx.  . Gout   . Hypertension   . NSTEMI (non-ST elevated myocardial infarction) ([place])   . Renal cell carcinoma ([place]) 11/28/2014  . Renal cell carcinoma ([place])   . Shingles 7/15    Past Surgical History:  Procedure Laterality Date  . CORONARY ARTERY BYPASS GRAFT N/A 01/27/2014   Procedure: CORONARY ARTERY BYPASS GRAFTING (CABG), ON PUMP, TIMES FOUR, USING LEFT INTERNAL MAMMARY ARTERY, RIGHT GREATER SAPHENOUS VEIN HARVESTED ENDOSCOPICALLY;  Surgeon: Gaye Pollack, MD;  Location: [place];  Service: Open Heart Surgery;  Laterality:  N/A;  LIMA-LAD; SVG-OM; SVG-PD; SVG-DIAG  . KNEE SURGERY    . LEFT HEART CATHETERIZATION WITH CORONARY ANGIOGRAM N/A 01/23/2014   Procedure: LEFT HEART CATHETERIZATION WITH CORONARY ANGIOGRAM;  Surgeon: Blane Ohara, MD;  Location: [facility] CATH LAB;  Service: Cardiovascular;  Laterality: N/A;  . NEPHRECTOMY Right 11/28/2014     Procedure: RIGHT OPEN NEPHRECTOMY;  Surgeon: Cleon Gustin, MD;  Location: [place];  Service: Urology;  Laterality: Right;  . OPEN PARTIAL HEPATECTOMY  N/A 11/28/2014   Procedure: OPEN PARTIAL HEPATECTOMY;  Surgeon: [name], MD;  Location: [place];  Service: General;  Laterality: N/A;  . TEE WITHOUT CARDIOVERSION N/A 01/27/2014   Procedure: TRANSESOPHAGEAL ECHOCARDIOGRAM (TEE);  Surgeon: Gaye Pollack, MD;  Location: [place];  Service: Open Heart Surgery;  Laterality: N/A;    Medications: Prior to Admission medications   Medication Sig Start Date End Date Taking? Authorizing Provider  aspirin EC 81 MG tablet Take 81 mg by mouth daily.   Yes Historical Provider, MD  cabozantinib S-Malate (CABOMETYX) 40 MG TABS Take 1 tablet by mouth daily. 01/22/16  Yes Wyatt Portela, MD  carvedilol (COREG) 12.5 MG tablet Take 12.5 mg by mouth 2 (two) times daily with a meal.  08/17/15  Yes Historical Provider, MD  Cholecalciferol (VITAMIN D-3) 5000 UNITS TABS Take 5,000 Units by mouth daily.    Yes Historical Provider, MD  colchicine 0.6 MG tablet Reported on 08/17/2015   Yes Historical Provider, MD  diphenoxylate-atropine (LOMOTIL) 2.5-0.025 MG tablet Take 2 tablets by mouth 4 (four) times daily as needed for diarrhea or loose stools. 12/06/15  Yes Susanne Borders, NP  docusate sodium (COLACE) 100 MG capsule Take 1 capsule (100 mg total) by mouth 2 (two) times daily. 12/01/14  Yes Cleon Gustin, MD  HYDROmorphone (DILAUDID) 4 MG tablet 4 mg every 2-4 hours prn pain 12/26/15  Yes Wyatt Portela, MD  KLOR-CON M20 20 MEQ tablet TAKE ONE TABLET BY MOUTH ONCE DAILY 10/08/15  Yes [name], MD  lisinopril (PRINIVIL,ZESTRIL) 40 MG tablet Take 1 tablet (40 mg total) by mouth daily. 09/21/15  Yes [name], MD  LORazepam (ATIVAN) 0.5 MG tablet Take 0.5-1 mg by mouth at bedtime.   Yes Historical Provider, MD  magic mouthwash SOLN Take 5 mLs by mouth 3 (three) times daily as needed for mouth pain.  02/05/16  Yes Wyatt Portela, MD  morphine (MS CONTIN) 30 MG 12 hr tablet Take 1 tablet (30 mg total) by mouth every 8 (eight) hours. 12/26/15  Yes Wyatt Portela, MD  Multiple Vitamin (MULTIVITAMIN) capsule Take 1 capsule by mouth daily.   Yes Historical Provider, MD  omega-3 acid ethyl esters (LOVAZA) 1 g capsule Take 1 g by mouth daily.   Yes Historical Provider, MD  Turmeric 500 MG CAPS Take 500 mg by mouth daily.    Yes Historical Provider, MD  ULORIC 40 MG tablet TAKE ONE TABLET BY MOUTH ONCE DAILY 01/08/16  Yes Gay Filler Copland, MD  ondansetron (ZOFRAN) 4 MG tablet TAKE ONE TABLET BY MOUTH THREE TIMES DAILY Patient not taking: Reported on 02/08/2016 10/21/15   Tyler Pita, MD    Allergies:   Allergies  Allergen Reactions  . Lipitor [Atorvastatin] Other (See Comments)    Myopathy...severe, with  Myoglobinuria, wheelchair bound for a few days  . Penicillins Anaphylaxis    As a child Has patient had a PCN reaction causing immediate rash, facial/tongue/throat swelling, SOB or lightheadedness with hypotension:  yes Has patient had a PCN reaction causing severe rash involving mucus membranes or skin necrosis: yes Has patient had a PCN reaction that required hospitalization: yes Has patient had a PCN reaction occurring within the last 10 years: no If all of the above answers are "NO", then may proceed with Cephalosporin use.   . Prednisone Anxiety and Other (See Comments)    Extremely high blood pressure after taking medication for first time.    Social History:  reports that he has never smoked. He has never used smokeless tobacco. He reports that he drinks alcohol. He reports that he does not use drugs.  Family History: Family History  Problem Relation Age of Onset  . Lung cancer Mother   . Lung cancer Father   . Bone cancer Maternal Grandfather     Physical Exam: Blood pressure 106/57, pulse 66, temperature 99.7 F (37.6 C), temperature source Oral, resp. rate 12, height  5\' 8"  (1.727 m), weight 73 kg (161 lb), SpO2 (!) 89 %. General: Alert, awake, oriented x3, in no acute distress. HEENT: normocephalic, atraumatic, anicteric sclera,pale conjunctiva, pupils equal and reactive to light and accomodation, oropharynx clear Neck: supple, no masses or lymphadenopathy, no goiter, no bruits  Heart: Regular rate and rhythm, without murmurs, rubs or gallops. Lungs: Clear to auscultation bilaterally, no wheezing, rales or rhonchi. Abdomen: Soft, nontender, nondistended, positive bowel sounds, no masses. Extremities: No clubbing, cyanosis or edema with positive pedal pulses. Neuro: Grossly intact, no focal neurological deficits, strength 5/5 upper and lower extremities bilaterally Psych: alert and oriented x 3, normal mood and affect Skin: no rashes or lesions, warm and dry, overall pale   LABS on Admission:  Basic Metabolic Panel:  Recent Labs Lab 02/04/16 1116 02/08/16 0716  NA 128* 127*  K 5.1 4.4  CL  --  91*  CO2 27 26  GLUCOSE 119 135*  BUN 16.0 25*  CREATININE 0.9 0.92  CALCIUM 9.8 8.0*   Liver Function Tests:  Recent Labs Lab 02/04/16 1116 02/08/16 0716  AST 17 19  ALT 13 14*  ALKPHOS 88 75  BILITOT 0.65 0.7  PROT 7.4 5.9*  ALBUMIN 2.5* 2.3*   No results for input(s): LIPASE, AMYLASE in the last 168 hours. No results for input(s): AMMONIA in the last 168 hours. CBC:  Recent Labs Lab 02/04/16 1116 02/08/16 0716  WBC 7.2 9.4  NEUTROABS 6.3 8.5*  HGB 11.3* 8.6*  HCT 34.4* 24.6*  MCV 96.4 92.5  PLT 280 277   Cardiac Enzymes: No results for input(s): CKTOTAL, CKMB, CKMBINDEX, TROPONINI in the last 168 hours. BNP: Invalid input(s): POCBNP CBG: No results for input(s): GLUCAP in the last 168 hours.  Radiological Exams on Admission:  No results found.  *I have personally reviewed the images above*     Assessment/Plan Principal Problem:   Acute lower GI bleeding - Hemoglobin currently 8.6, baseline 11.3 on 12/4 - Obtain  serial H&H q6hrs, GI consulted ([facility]) - Transfuse packed RBC with goal hb >8 - Hold aspirin, PPI 40mg  q12hrs   Active Problems:  Hypotension with underlying history of HTN (hypertension) - BP currently borderline soft, hold lisinopril, Coreg    Metastatic renal cell carcinoma (HCC) with L1 metastatic disease, compression fracture, intractable back pain - Family has been trying to schedule kyphoplasty outpatient, requesting if it can be done inpatient - IR consult placed - PT OT evaluation prior to DC, per family patient has been nonambulatory in the last 1 week. 10 days ago he  was ambulating with a cane and had gone on a cruise.    Hyponatremia with Dehydration - Placed on IV fluid hydration  DVT prophylaxis: SCD's   CODE STATUS: full code, discussed with the patient   Consults called: Gastroenterology, IR  Family Communication: Admission, patients condition and plan of care including tests being ordered have been discussed with the patient and son, wife. who indicates understanding and agree with the plan and Code Status  Admission status: Inpatient  Disposition plan: Further plan will depend as patient's clinical course evolves and further radiologic and laboratory data become available.     At the time of admission, it appears that the appropriate admission status for this patient is INPATIENT . This is judged to be reasonable and necessary in order to provide the required intensity of service to ensure the patient's safety given the presenting symptoms, physical exam findings, and initial radiographic and laboratory data in the context of their chronic comorbidities.    Addendum: 11:35am  I spoke with IR Ascencion Dike who in discussion with the Dr. Earleen Newport, did not feel that patient's L1 compression fracture is a straightforward kyphoplasty case. He requested me to order CT of the abdomen and pelvis with IV contrast to rule out any erosion of this disc bulge into the  rectal area to make sure there is no correlation with GI bleeding. IR will follow CT of the abdomen and pelvis.     Time Spent on Admission: 80mins   Breanna Shorkey M.D. [facility] 02/08/2016, 9:08 AM Pager: [phone]  If 7PM-7AM, please contact night-coverage [email or web address] Password [email or web address]

## 2016-02-08 NOTE — ED Provider Notes (Signed)
[facility] DEPT Provider Note   CSN: [id number] Arrival date & time: 02/08/16  [id number]     History   Chief Complaint Chief Complaint  Patient presents with  . Rectal Bleeding    HPI Derek [name] is a 65 y.o. male.  He is getting chemotherapy for renal cell carcinoma and has been having back pain secondary to compression fracture of L1. This morning, he had bright red blood in his stool. He has been feeling generally weak today. He never has had blood in his stool before. He is not on any anticoagulants but he is on clopidogrel. He denies abdominal pain, nausea, vomiting. His back pain is controlled with morphine and hydromorphone. He is not on any NSAIDs.   The history is provided by the patient.  Rectal Bleeding    Past Medical History:  Diagnosis Date  . Arthritis   . Bone cancer ([place])    rcc with L1 pathologic compression fracture  . Cancer [facility])    rcc with L1 pathologic compression fx.  . Gout   . Hypertension   . NSTEMI (non-ST elevated myocardial infarction) ([place])   . Renal cell carcinoma ([place]) 11/28/2014  . Renal cell carcinoma ([place])   . Shingles 7/15    Patient Active Problem List   Diagnosis Date Noted  . Diarrhea 10/29/2015  . Dehydration 10/29/2015  . 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 Medication management 09/21/2015  . Hyperlipidemia 12/11/2014  . Postoperative anemia due to acute blood loss 12/02/2014  . Metastatic renal cell carcinoma ([place]) 11/28/2014  . Gout 02/17/2014  . 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 HTN (hypertension) 01/23/2014  . Obesity 01/23/2014  . Prediabetes 01/23/2014  . Systolic and diastolic CHF, acute ([place]) 01/21/2014  . NSTEMI (non-ST elevated myocardial infarction) ([place]) 01/21/2014  . LVH (left ventricular hypertrophy) due to hypertensive disease 01/13/2014    Past Surgical History:  Procedure Laterality Date  . CORONARY ARTERY BYPASS GRAFT N/A 01/27/2014   Procedure: CORONARY ARTERY BYPASS GRAFTING (CABG), ON PUMP, TIMES FOUR, USING  LEFT INTERNAL MAMMARY ARTERY, RIGHT GREATER SAPHENOUS VEIN HARVESTED ENDOSCOPICALLY;  Surgeon: Gaye Pollack, MD;  Location: [facility];  Service: Open Heart Surgery;  Laterality: N/A;  LIMA-LAD; SVG-OM; SVG-PD; SVG-DIAG  . KNEE SURGERY    . LEFT HEART CATHETERIZATION WITH CORONARY ANGIOGRAM N/A 01/23/2014   Procedure: LEFT HEART CATHETERIZATION WITH CORONARY ANGIOGRAM;  Surgeon: Blane Ohara, MD;  Location: [facility] CATH LAB;  Service: Cardiovascular;  Laterality: N/A;  . NEPHRECTOMY Right 11/28/2014   Procedure: RIGHT OPEN NEPHRECTOMY;  Surgeon: Cleon Gustin, MD;  Location: [facility];  Service: Urology;  Laterality: Right;  . OPEN PARTIAL HEPATECTOMY  N/A 11/28/2014   Procedure: OPEN PARTIAL HEPATECTOMY;  Surgeon: [name], MD;  Location: [facility];  Service: General;  Laterality: N/A;  . TEE WITHOUT CARDIOVERSION N/A 01/27/2014   Procedure: TRANSESOPHAGEAL ECHOCARDIOGRAM (TEE);  Surgeon: Gaye Pollack, MD;  Location: [facility];  Service: Open Heart Surgery;  Laterality: N/A;       Home Medications    Prior to Admission medications   Medication Sig Start Date End Date Taking? Authorizing Provider  aspirin EC 81 MG tablet Take 81 mg by mouth daily.    Historical Provider, MD  cabozantinib S-Malate (CABOMETYX) 40 MG TABS Take 1 tablet by mouth daily. 01/22/16   Wyatt Portela, MD  carvedilol (COREG) 12.5 MG tablet  08/17/15   Historical Provider, MD  Cholecalciferol (VITAMIN D-3) 5000 UNITS TABS Take 5,000 Units by mouth daily.  Historical Provider, MD  colchicine 0.6 MG tablet Reported on 08/17/2015    Historical Provider, MD  diphenoxylate-atropine (LOMOTIL) 2.5-0.025 MG tablet Take 2 tablets by mouth 4 (four) times daily as needed for diarrhea or loose stools. 12/06/15   Susanne Borders, NP  docusate sodium (COLACE) 100 MG capsule Take 1 capsule (100 mg total) by mouth 2 (two) times daily. 12/01/14   Cleon Gustin, MD  furosemide (LASIX) 20 MG tablet  08/25/15   Historical Provider, MD    HYDROmorphone (DILAUDID) 4 MG tablet 4 mg every 2-4 hours prn pain 12/26/15   Wyatt Portela, MD  KLOR-CON M20 20 MEQ tablet TAKE ONE TABLET BY MOUTH ONCE DAILY 10/08/15   [name], MD  lisinopril (PRINIVIL,ZESTRIL) 40 MG tablet Take 1 tablet (40 mg total) by mouth daily. 09/21/15   [name], MD  LORazepam (ATIVAN) 0.5 MG tablet Take 0.5-1 mg by mouth at bedtime.    Historical Provider, MD  magic mouthwash SOLN Take 5 mLs by mouth 3 (three) times daily as needed for mouth pain. 02/05/16   Wyatt Portela, MD  morphine (MS CONTIN) 30 MG 12 hr tablet Take 1 tablet (30 mg total) by mouth every 8 (eight) hours. 12/26/15   Wyatt Portela, MD  Multiple Vitamin (MULTIVITAMIN) capsule Take 1 capsule by mouth daily.    Historical Provider, MD  omega-3 acid ethyl esters (LOVAZA) 1 g capsule Take 1 g by mouth daily.    Historical Provider, MD  ondansetron (ZOFRAN) 4 MG tablet TAKE ONE TABLET BY MOUTH THREE TIMES DAILY 10/21/15   Tyler Pita, MD  Turmeric 500 MG CAPS Take 500 mg by mouth daily.     Historical Provider, MD  ULORIC 40 MG tablet TAKE ONE TABLET BY MOUTH ONCE DAILY 01/08/16   Darreld Mclean, MD    Family History Family History  Problem Relation Age of Onset  . Lung cancer Mother   . Lung cancer Father   . Bone cancer Maternal Grandfather     Social History Social History  Substance Use Topics  . Smoking status: Never Smoker  . Smokeless tobacco: Never Used  . Alcohol use Yes     Comment: 1-2 drinks/week     Allergies   Lipitor [atorvastatin]; Penicillins; and Prednisone   Review of Systems Review of Systems  Gastrointestinal: Positive for hematochezia.  All other systems reviewed and are negative.    Physical Exam Updated Vital Signs BP 110/68 (BP Location: Right Arm)   Pulse 72   Temp 99.7 F (37.6 C) (Oral)   Resp 15   Ht 5\' 8"  (1.727 m)   Wt 161 lb (73 kg)   SpO2 99%   BMI 24.48 kg/m   Physical Exam  Nursing note and vitals reviewed.  65  year old male, resting comfortably and in no acute distress. Vital signs are normal. Oxygen saturation is 99%, which is normal. Head is normocephalic and atraumatic. PERRLA, EOMI. Oropharynx is clear. Neck is nontender and supple without adenopathy or JVD. Back is nontender and there is no CVA tenderness. Lungs are clear without rales, wheezes, or rhonchi. Chest is nontender. Heart has regular rate and rhythm without murmur. Abdomen is soft, flat, nontender without masses or hepatosplenomegaly and peristalsis is normoactive. Rectal: Normal sphincter tone. Light brown stool is present mixed with bright red blood. Extremities have no cyanosis or edema, full range of motion is present. Skin is pale, warm, and dry without rash. Neurologic: Mental status  is normal, cranial nerves are intact, there are no motor or sensory deficits.  ED Treatments / Results  Labs (all labs ordered are listed, but only abnormal results are displayed) Labs Reviewed  COMPREHENSIVE METABOLIC PANEL - Abnormal; Notable for the following:       Result Value   Sodium 127 (*)    Chloride 91 (*)    Glucose, Bld 135 (*)    BUN 25 (*)    Calcium 8.0 (*)    Total Protein 5.9 (*)    Albumin 2.3 (*)    ALT 14 (*)    All other components within normal limits  CBC WITH DIFFERENTIAL/PLATELET - Abnormal; Notable for the following:    RBC 2.66 (*)    Hemoglobin 8.6 (*)    HCT 24.6 (*)    Neutro Abs 8.5 (*)    Lymphs Abs 0.3 (*)    All other components within normal limits  POC OCCULT BLOOD, ED - Abnormal; Notable for the following:    Fecal Occult Bld POSITIVE (*)    All other components within normal limits  URINALYSIS, ROUTINE W REFLEX MICROSCOPIC  I-STAT CG4 LACTIC ACID, ED  TYPE AND SCREEN  ABO/RH   Procedures Procedures (including critical care time)  Medications Ordered in ED Medications - No data to display   Initial Impression / Assessment and Plan / ED Course  I have reviewed the triage vital signs  and the nursing notes.  Pertinent labs & imaging results that were available during my care of the patient were reviewed by me and considered in my medical decision making (see chart for details).  Clinical Course    GI bleeding. Blood pressure and heart rate are normal. Screening labs are obtained. Old records are reviewed confirming he is undergoing chemotherapy for metastatic renal cell carcinoma. Last CBC was done on December 4 at which time plain count was 280,000 and hemoglobin 11.3.  Hemoglobin has dropped to 8.6. Lactic acid level is normal. There is mild to moderate hyponatremia which is slightly worse than most recent value but not felt to be clinically significant. Case is discussed with Dr. Tana Coast of [facility] who agrees to admit the patient.  Final Clinical Impressions(s) / ED Diagnoses   Final diagnoses:  Acute GI bleeding  Hyponatremia    New Prescriptions New Prescriptions   No medications on file     Delora Fuel, MD [id number] [phone]

## 2016-02-08 NOTE — ED Notes (Signed)
MD at bedside. 

## 2016-02-09 DIAGNOSIS — Z515 Encounter for palliative care: Secondary | ICD-10-CM

## 2016-02-09 DIAGNOSIS — R198 Other specified symptoms and signs involving the digestive system and abdomen: Secondary | ICD-10-CM

## 2016-02-09 DIAGNOSIS — Z7189 Other specified counseling: Secondary | ICD-10-CM

## 2016-02-09 LAB — BASIC METABOLIC PANEL
Anion gap: 6 (ref 5–15)
BUN: 18 mg/dL (ref 6–20)
CALCIUM: 8 mg/dL — AB (ref 8.9–10.3)
CHLORIDE: 95 mmol/L — AB (ref 101–111)
CO2: 27 mmol/L (ref 22–32)
CREATININE: 0.77 mg/dL (ref 0.61–1.24)
GFR calc Af Amer: >60 mL/min (ref >60)
GFR calc non Af Amer: >60 mL/min (ref >60)
Glucose, Bld: 111 mg/dL — ABNORMAL HIGH (ref 65–99)
Potassium: 4.5 mmol/L (ref 3.5–5.1)
SODIUM: 128 mmol/L — AB (ref 135–145)

## 2016-02-09 LAB — CBC
HCT: 23.1 % — ABNORMAL LOW (ref 39.0–52.0)
Hemoglobin: 8.1 g/dL — ABNORMAL LOW (ref 13.0–17.0)
MCH: 32.5 pg (ref 26.0–34.0)
MCHC: 35.1 g/dL (ref 30.0–36.0)
MCV: 92.8 fL (ref 78.0–100.0)
PLATELETS: 291 10*3/uL (ref 150–400)
RBC: 2.49 MIL/uL — AB (ref 4.22–5.81)
RDW: 14.4 % (ref 11.5–15.5)
WBC: 10.1 10*3/uL (ref 4.0–10.5)

## 2016-02-09 LAB — HEMOGLOBIN AND HEMATOCRIT, BLOOD
HCT: 23 % — ABNORMAL LOW (ref 39.0–52.0)
HEMOGLOBIN: 8.1 g/dL — AB (ref 13.0–17.0)

## 2016-02-09 MED ORDER — VANCOMYCIN HCL IN DEXTROSE 1-5 GM/200ML-% IV SOLN
1000.0000 mg | Freq: Two times a day (BID) | INTRAVENOUS | Status: DC
Start: 2016-02-09 — End: 2016-02-12
  Administered 2016-02-09 – 2016-02-12 (×6): 1000 mg via INTRAVENOUS
  Filled 2016-02-09 (×5): qty 200

## 2016-02-09 MED ORDER — DEXTROSE 5 % IV SOLN
2.0000 g | Freq: Three times a day (TID) | INTRAVENOUS | Status: DC
  Administered 2016-02-09 – 2016-02-10 (×2): 2 g via INTRAVENOUS
  Filled 2016-02-09 (×3): qty 2

## 2016-02-09 NOTE — Consult Note (Signed)
Reason for Consult: L1 pathologic fracture/infection, retroperitoneal/paraspinous abscess, colon fistula Referring Physician: Dr. Amado [name] [name] is an 65 y.o. male.  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 He is status post right nephrectomy, radiation therapy, and chemotherapy. The patient had an L1 metastasis and underwent radiation therapy. He had been referred to interventional radiology for kyphoplasty next week. The patient was admitted to [facility] with increasing back pain and rectal bleeding. 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 A neurosurgical consultation has been requested by Dr. Wyline [name].  Presently the patient is alert and pleasant. He is accompanied by family friends and his son. He complains of low back pain. He is quite pale.  Past Medical History:  Diagnosis Date  . Arthritis   . Bone cancer ([place])    rcc with L1 pathologic compression fracture  . Cancer [facility])    rcc with L1 pathologic compression fx.  . Gout   . Hypertension   . NSTEMI (non-ST elevated myocardial infarction) ([place])   . Renal cell carcinoma ([place]) 11/28/2014  . Renal cell carcinoma ([place])   . Shingles 7/15    Past Surgical History:  Procedure Laterality Date  . CORONARY ARTERY BYPASS GRAFT N/A 01/27/2014   Procedure: CORONARY ARTERY BYPASS GRAFTING (CABG), ON PUMP, TIMES FOUR, USING LEFT INTERNAL MAMMARY ARTERY, RIGHT GREATER SAPHENOUS VEIN HARVESTED ENDOSCOPICALLY;  Surgeon: Derek Pollack, MD;  Location: [place];  Service: Open Heart Surgery;  Laterality: N/A;  LIMA-LAD; SVG-OM; SVG-PD; SVG-DIAG  . KNEE SURGERY    . LEFT HEART CATHETERIZATION WITH CORONARY ANGIOGRAM N/A 01/23/2014   Procedure: LEFT HEART CATHETERIZATION WITH CORONARY ANGIOGRAM;   Surgeon: Derek Ohara, MD;  Location: [facility] CATH LAB;  Service: Cardiovascular;  Laterality: N/A;  . NEPHRECTOMY Right 11/28/2014   Procedure: RIGHT OPEN NEPHRECTOMY;  Surgeon: Derek Gustin, MD;  Location: [place];  Service: Urology;  Laterality: Right;  . OPEN PARTIAL HEPATECTOMY  N/A 11/28/2014   Procedure: OPEN PARTIAL HEPATECTOMY;  Surgeon: Derek Klein, MD;  Location: [place];  Service: General;  Laterality: N/A;  . TEE WITHOUT CARDIOVERSION N/A 01/27/2014   Procedure: TRANSESOPHAGEAL ECHOCARDIOGRAM (TEE);  Surgeon: Derek Pollack, MD;  Location: [place];  Service: Open Heart Surgery;  Laterality: N/A;    Family History  Problem Relation Age of Onset  . Lung cancer Mother   . Lung cancer Father   . Bone cancer Maternal Grandfather     Social History:  reports that he has never smoked. He has never used smokeless tobacco. He reports that he drinks alcohol. He reports that he does not use drugs.  Allergies:  Allergies  Allergen Reactions  . Lipitor [Atorvastatin] Other (See Comments)    Myopathy...severe, with  Myoglobinuria, wheelchair bound for a few days  . Penicillins Anaphylaxis    As a child Has patient had a PCN reaction causing immediate rash, facial/tongue/throat swelling, SOB or lightheadedness with hypotension: yes Has patient had a PCN reaction causing severe rash involving mucus membranes or skin necrosis: yes Has patient had a PCN reaction that required hospitalization: yes Has patient had a PCN reaction occurring within the last 10 years: no If all of the above answers are "NO", then may proceed with Cephalosporin use.   . Prednisone Anxiety and  Other (See Comments)    Extremely high blood pressure after taking medication for first time.    Medications:  I have reviewed the patient's current medications. Prior to Admission:  Prescriptions Prior to Admission  Medication Sig Dispense Refill Last Dose  . aspirin EC 81 MG tablet Take 81 mg by mouth daily.    02/07/2016 at Unknown time  . cabozantinib S-Malate (CABOMETYX) 40 MG TABS Take 1 tablet by mouth daily. 30 tablet 0 02/07/2016 at Unknown time  . carvedilol (COREG) 12.5 MG tablet Take 12.5 mg by mouth 2 (two) times daily with a meal.   0 02/07/2016 at 0800pm  . Cholecalciferol (VITAMIN D-3) 5000 UNITS TABS Take 5,000 Units by mouth daily.    02/07/2016 at Unknown time  . colchicine 0.6 MG tablet Reported on 08/17/2015   unknown  . diphenoxylate-atropine (LOMOTIL) 2.5-0.025 MG tablet Take 2 tablets by mouth 4 (four) times daily as needed for diarrhea or loose stools. 60 tablet 0 Past Month at Unknown time  . docusate sodium (COLACE) 100 MG capsule Take 1 capsule (100 mg total) by mouth 2 (two) times daily. 60 capsule 0 02/07/2016 at Unknown time  . HYDROmorphone (DILAUDID) 4 MG tablet 4 mg every 2-4 hours prn pain 60 tablet 0 02/07/2016 at Unknown time  . KLOR-CON M20 20 MEQ tablet TAKE ONE TABLET BY MOUTH ONCE DAILY 30 tablet 10 02/07/2016 at Unknown time  . lisinopril (PRINIVIL,ZESTRIL) 40 MG tablet Take 1 tablet (40 mg total) by mouth daily. 90 tablet 3 02/07/2016 at Unknown time  . LORazepam (ATIVAN) 0.5 MG tablet Take 0.5-1 mg by mouth at bedtime.   02/07/2016 at Unknown time  . magic mouthwash SOLN Take 5 mLs by mouth 3 (three) times daily as needed for mouth pain. 100 mL 0 02/06/2016  . morphine (MS CONTIN) 30 MG 12 hr tablet Take 1 tablet (30 mg total) by mouth every 8 (eight) hours. 90 tablet 0 02/07/2016 at Unknown time  . Multiple Vitamin (MULTIVITAMIN) capsule Take 1 capsule by mouth daily.   02/07/2016 at Unknown time  . omega-3 acid ethyl esters (LOVAZA) 1 g capsule Take 1 g by mouth daily.   02/07/2016 at Unknown time  . Turmeric 500 MG CAPS Take 500 mg by mouth daily.    Past Week at Unknown time  . ULORIC 40 MG tablet TAKE ONE TABLET BY MOUTH ONCE DAILY 30 tablet 1 02/07/2016 at Unknown time  . ondansetron (ZOFRAN) 4 MG tablet TAKE ONE TABLET BY MOUTH THREE TIMES DAILY (Patient not taking:  Reported on 02/08/2016) 20 tablet 0 Not Taking at Unknown time   Scheduled: . docusate sodium  100 mg Oral BID  . febuxostat  40 mg Oral Daily  . LORazepam  0.5-1 mg Oral QHS  . pantoprazole (PROTONIX) IV  40 mg Intravenous Q12H   Continuous: . sodium chloride 75 mL/hr (02/09/16 1309)   XNT:[id number] **OR** acetaminophen, diphenoxylate-atropine, HYDROmorphone (DILAUDID) injection, magic mouthwash, ondansetron **OR** ondansetron (ZOFRAN) IV Anti-infectives    None       Results for orders placed or performed during the hospital encounter of 02/08/16 (from the past 48 hour(s))  POC occult blood, ED Provider will collect     Status: Abnormal   Collection Time: 02/08/16  6:57 AM  Result Value Ref Range   Fecal Occult Bld POSITIVE (A) NEGATIVE  ABO/Rh     Status: None   Collection Time: 02/08/16  7:11 AM  Result Value Ref Range   ABO/RH(D) AB POS  Comprehensive metabolic panel     Status: Abnormal   Collection Time: 02/08/16  7:16 AM  Result Value Ref Range   Sodium 127 (L) 135 - 145 mmol/L   Potassium 4.4 3.5 - 5.1 mmol/L   Chloride 91 (L) 101 - 111 mmol/L   CO2 26 22 - 32 mmol/L   Glucose, Bld 135 (H) 65 - 99 mg/dL   BUN 25 (H) 6 - 20 mg/dL   Creatinine, Ser 0.92 0.61 - 1.24 mg/dL   Calcium 8.0 (L) 8.9 - 10.3 mg/dL   Total Protein 5.9 (L) 6.5 - 8.1 g/dL   Albumin 2.3 (L) 3.5 - 5.0 g/dL   AST 19 15 - 41 U/L   ALT 14 (L) 17 - 63 U/L   Alkaline Phosphatase 75 38 - 126 U/L   Total Bilirubin 0.7 0.3 - 1.2 mg/dL   GFR calc non Af Amer >60 >60 mL/min   GFR calc Af Amer >60 >60 mL/min    Comment: (NOTE) The eGFR has been calculated using the CKD EPI equation. This calculation has not been validated in all clinical situations. eGFR's persistently <60 mL/min signify possible Chronic Kidney Disease.    Anion gap 10 5 - 15  CBC with Differential     Status: Abnormal   Collection Time: 02/08/16  7:16 AM  Result Value Ref Range   WBC 9.4 4.0 - 10.5 K/uL   RBC 2.66 (L) 4.22  - 5.81 MIL/uL   Hemoglobin 8.6 (L) 13.0 - 17.0 g/dL   HCT 24.6 (L) 39.0 - 52.0 %   MCV 92.5 78.0 - 100.0 fL   MCH 32.3 26.0 - 34.0 pg   MCHC 35.0 30.0 - 36.0 g/dL   RDW 14.1 11.5 - 15.5 %   Platelets 277 150 - 400 K/uL   Neutrophils Relative % 91 %   Neutro Abs 8.5 (H) 1.7 - 7.7 K/uL   Lymphocytes Relative 4 %   Lymphs Abs 0.3 (L) 0.7 - 4.0 K/uL   Monocytes Relative 5 %   Monocytes Absolute 0.5 0.1 - 1.0 K/uL   Eosinophils Relative 0 %   Eosinophils Absolute 0.0 0.0 - 0.7 K/uL   Basophils Relative 0 %   Basophils Absolute 0.0 0.0 - 0.1 K/uL  Type and screen [place]     Status: None   Collection Time: 02/08/16  7:16 AM  Result Value Ref Range   ABO/RH(D) AB POS    Antibody Screen NEG    Sample Expiration 02/11/2016   I-Stat CG4 Lactic Acid, ED     Status: None   Collection Time: 02/08/16  7:25 AM  Result Value Ref Range   Lactic Acid, Venous 1.51 0.5 - 1.9 mmol/L  Hemoglobin and hematocrit, blood     Status: Abnormal   Collection Time: 02/08/16 10:50 AM  Result Value Ref Range   Hemoglobin 8.5 (L) 13.0 - 17.0 g/dL   HCT 24.2 (L) 39.0 - 52.0 %  Hemoglobin and hematocrit, blood     Status: Abnormal   Collection Time: 02/08/16  4:40 PM  Result Value Ref Range   Hemoglobin 8.6 (L) 13.0 - 17.0 g/dL   HCT 24.4 (L) 39.0 - 52.0 %  Urinalysis, Routine w reflex microscopic     Status: Abnormal   Collection Time: 02/08/16  7:29 PM  Result Value Ref Range   Color, Urine YELLOW YELLOW   APPearance CLEAR CLEAR   Specific Gravity, Urine >1.046 (H) 1.005 - 1.030   pH 5.0 5.0 -  8.0   Glucose, UA NEGATIVE NEGATIVE mg/dL   Hgb urine dipstick SMALL (A) NEGATIVE   Bilirubin Urine NEGATIVE NEGATIVE   Ketones, ur 20 (A) NEGATIVE mg/dL   Protein, ur NEGATIVE NEGATIVE mg/dL   Nitrite NEGATIVE NEGATIVE   Leukocytes, UA NEGATIVE NEGATIVE   RBC / HPF 0-5 0 - 5 RBC/hpf   WBC, UA 0-5 0 - 5 WBC/hpf   Bacteria, UA RARE (A) NONE SEEN   Squamous Epithelial / LPF 0-5 (A)  NONE SEEN   Mucous PRESENT   Hemoglobin and hematocrit, blood     Status: Abnormal   Collection Time: 02/08/16 10:32 PM  Result Value Ref Range   Hemoglobin 8.2 (L) 13.0 - 17.0 g/dL   HCT 23.2 (L) 39.0 - 74.0 %  Basic metabolic panel     Status: Abnormal   Collection Time: 02/09/16  5:14 AM  Result Value Ref Range   Sodium 128 (L) 135 - 145 mmol/L   Potassium 4.5 3.5 - 5.1 mmol/L   Chloride 95 (L) 101 - 111 mmol/L   CO2 27 22 - 32 mmol/L   Glucose, Bld 111 (H) 65 - 99 mg/dL   BUN 18 6 - 20 mg/dL   Creatinine, Ser 0.77 0.61 - 1.24 mg/dL   Calcium 8.0 (L) 8.9 - 10.3 mg/dL   GFR calc non Af Amer >60 >60 mL/min   GFR calc Af Amer >60 >60 mL/min    Comment: (NOTE) The eGFR has been calculated using the CKD EPI equation. This calculation has not been validated in all clinical situations. eGFR's persistently <60 mL/min signify possible Chronic Kidney Disease.    Anion gap 6 5 - 15  CBC     Status: Abnormal   Collection Time: 02/09/16  5:14 AM  Result Value Ref Range   WBC 10.1 4.0 - 10.5 K/uL   RBC 2.49 (L) 4.22 - 5.81 MIL/uL   Hemoglobin 8.1 (L) 13.0 - 17.0 g/dL   HCT 23.1 (L) 39.0 - 52.0 %   MCV 92.8 78.0 - 100.0 fL   MCH 32.5 26.0 - 34.0 pg   MCHC 35.1 30.0 - 36.0 g/dL   RDW 14.4 11.5 - 15.5 %   Platelets 291 150 - 400 K/uL  Hemoglobin and hematocrit, blood     Status: Abnormal   Collection Time: 02/09/16 10:35 AM  Result Value Ref Range   Hemoglobin 8.1 (L) 13.0 - 17.0 g/dL   HCT 23.0 (L) 39.0 - 52.0 %    Ct Abdomen Pelvis W Contrast  Addendum Date: 02/08/2016   ADDENDUM REPORT: 02/08/2016 14:44 ADDENDUM: These results will be called to the ordering clinician or representative by the Radiologist Assistant, and communication documented in the PACS or zVision Dashboard. Electronically Signed   By: Marijo Conception, M.D.   On: 02/08/2016 14:44   Result Date: 02/08/2016 CLINICAL DATA:  History of metastatic renal cell carcinoma. Rectal bleeding. EXAM: CT ABDOMEN AND PELVIS  WITH CONTRAST TECHNIQUE: Multidetector CT imaging of the abdomen and pelvis was performed using the standard protocol following bolus administration of intravenous contrast. CONTRAST:  175m ISOVUE-300 IOPAMIDOL (ISOVUE-300) INJECTION 61% COMPARISON:  CT scan of November 16, 2015.  MRI of October 17, 2014. FINDINGS: Lower chest: Minimal bilateral pleural effusions are noted with adjacent subsegmental atelectasis. Hepatobiliary: Minimal cholelithiasis is noted. Enhancing 17 mm rounded abnormality is seen superiorly in right hepatic lobe concerning for possible metastatic lesion. This appears to be enlarged compared to prior MRI. Status post surgical resection of lesions seen  peripherally in right hepatic lobe on prior MRI. Pancreas: Unremarkable. No pancreatic ductal dilatation or surrounding inflammatory changes. Spleen: Normal in size without focal abnormality. Adrenals/Urinary Tract: Adrenal glands are unremarkable. 6.9 cm simple cyst is seen arising from upper pole of left kidney. No hydronephrosis or renal obstruction is noted. Urinary bladder appears normal. Status post right nephrectomy. Stomach/Bowel: There is a large area of inflammation and soft tissue gas which extends from the hepatic flexure medially and into the L1 vertebral body, which demonstrates severe pathologic fracture. This gas appears to communicate with the lumen of the hepatic flexure. This abnormality appears to extend into the inferior portion of right hepatic lobe. There is no evidence of bowel obstruction. Vascular/Lymphatic: Aortic atherosclerosis. No enlarged abdominal or pelvic lymph nodes. Reproductive: Stable mild prostatic enlargement is noted. Other: No hernia is noted. Musculoskeletal: As noted previously, severe wedge compression deformity of L1 vertebral body is noted secondary to pathologic fracture, with large amount of soft tissue gas within the right-sided the vertebral body and extending in the perispinal region consistent  with infection. Lytic destruction of L2 vertebral body is noted concerning for metastatic disease. IMPRESSION: Enhancing 17 mm rounded abnormality noted superiorly in right hepatic lobe concerning for metastatic lesion. This appears to be enlarged compared to prior studies. MRI is recommended for further evaluation. Lytic destruction of L2 vertebral body consistent with metastatic lesion. Large ill-defined area of soft tissue inflammation, fluid and gas is seen extending from hepatic flexure through right perispinal tissues into L1 vertebral body. L1 vertebral body has undergone severe compression deformity secondary to pathologic fracture. This abnormality does appear to extend into the medial and inferior portion of right hepatic lobe. 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 Electronically Signed: By: Marijo Conception, M.D. On: 02/08/2016 14:40    ROS: As above. The patient denies neck pain, headaches, numbness, tingling, etc. Blood pressure 129/64, pulse 78, temperature 99.7 F (37.6 C), temperature source Axillary, resp. rate 19, height _0  (1.727 m), weight 73 kg (161 lb), SpO2 97 %. Physical Exam  General: A pale 65 year old white male who complains of back pain.  HEENT: Normocephalic, consultation, extraocular muscles intact  Neck: Supple without masses or deformities. Has a normal range of motion.  Thorax: Symmetric  Abdomen: Soft  Extremities: Unremarkable  Neurologic exam: The patient is alert and oriented 3. Glasgow Coma Scale 15. Cranial nerves II through XII were examined bilaterally and grossly normal. Vision and hearing are grossly normal bilaterally. The patisuggest that  Derek [name] D 02/09/2016, 4:26 PM  ent's motor strength is 5 over 5 in his bilateral biceps, triceps, hand grip, quadriceps, gastrocnemius, dorsiflexors although does occasionally give away diffusely  to pain. Sensory function is intact to light touch sensation in all tested dermatomes of all 4 extremities. Cerebellar functions intact the rapid alternate movements of the upper extremities bilaterally.  I have reviewed the patient's lumbar MRI performed 02/04/2016. He has an L1 pathologic compression fracture/infection. There is evidence of abscess in the right paraspinous, right retroperitoneal region. He has moderate spinal stenosis at this level.  I've also reviewed the patient's CT of the abdomen and pelvis performed 02/08/2016. It demonstrates similar findings as above.  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 I have told him this is a very serious  problem. From a neurosurgical point of view, I can decompress and stabilize his spine once his infection/fistula has been addressed. I don't think a spinal decompression, instrumentation, fusion presently would be beneficial in the setting of an active colonic fissure. I would recommend that Gen. surgery, infectious disease, and urology be consulted to see if they have anything to offer the patient. I have put in a call to Dr. Wyline [name] to again discuss the situation with him. I am awaiting his return call.  Sharrie Rothman.D.

## 2016-02-09 NOTE — Progress Notes (Signed)
[facility] Progress Note  Derek [name] 65 y.o. [birth year]  CC:  GI bleed   Subjective: Patient is confused. Frequently opens eyes without following command. Discussed with the family. No bowel movements since admission  ROS : Not able to obtain   Objective: Vital signs in last 24 hours: Vitals:   02/08/16 2108 02/09/16 0604  BP: (!) 141/80 124/63  Pulse: 85 90  Resp: 20 20  Temp: 99.1 F (37.3 C) 98.6 F (37 C)    Physical Exam:  General:  Confused. Opens eyes frequently   Head:  Normocephalic, without obvious abnormality, atraumatic     Lungs:   Clear to auscultation bilaterally, respirations unlabored  Heart:  Regular rate and rhythm, S1, S2 normal  Abdomen:   Mildly distended, not able to appreciate tenderness. Bowel sounds present. No peritoneal signs   Extremities: Extremities normal, atraumatic, no  edema  Pulses: 2+ and symmetric    Lab Results:  Recent Labs  02/08/16 0716 02/09/16 0514  NA 127* 128*  K 4.4 4.5  CL 91* 95*  CO2 26 27  GLUCOSE 135* 111*  BUN 25* 18  CREATININE 0.92 0.77  CALCIUM 8.0* 8.0*    Recent Labs  02/08/16 0716  AST 19  ALT 14*  ALKPHOS 75  BILITOT 0.7  PROT 5.9*  ALBUMIN 2.3*    Recent Labs  02/08/16 0716  02/08/16 2232 02/09/16 0514  WBC 9.4  --   --  10.1  NEUTROABS 8.5*  --   --   --   HGB 8.6*  < > 8.2* 8.1*  HCT 24.6*  < > 23.2* 23.1*  MCV 92.5  --   --  92.8  PLT 277  --   --  291  < > = values in this interval not displayed. No results for input(s): LABPROT, INR in the last 72 hours.    Assessment/Plan: - GI bleed/hematochezia. Resolved. No bowel movement since admission. Hemoglobin stable. - Abnormal CT scan showing possible abscess with fistula connection between hepatic flexure and L1 involving part of the right hepatic lobe - Metastatic renal cancer with  Metastases to sauce muscle, hepatic flexure, bones  Recommendations ------------------------ - Discuss with the family and  admitting team. Recommend surgical evaluation given possible abscess with fistula connection between hepatic flexure and L1.  - No plan for endoscopic intervention. Patient is not a candidate for colonoscopy at this time. - GI will sign off. Call us back if needed   Otis Brace MD, FACP 02/09/2016, 9:17 AM  Pager [phone]  If no answer or after 5 PM call [phone]

## 2016-02-09 NOTE — Progress Notes (Signed)
PROGRESS NOTE    Derek [name]  ER:[id number] DOB: December 02, 1950 DOA: 02/08/2016 PCP: Derek Pai, PA-C    Brief Narrative:  65 year old male with metastatic renal cell carcinoma, hypertension, CAD, recent L1 pathological compression fracture with acute back pain presented to ED with rectal bleeding. Patient has been having intractable lower back pain due to L1 compression fracture and had been taking Dilaudid and long-acting morphine, mostly bedbound in the last 1 week. Family had been trying to schedule outpatient kyphoplasty with Derek [name]. This morning, patient woke up around 4:30am with bright red blood in the stool, subsequently had 2 more episodes. Per patient's son, he had 2 syncopal episodes, has been feeling dizzy and lightheaded since then. Patient is on aspirin 81 mg daily, not on any anticoagulation or NSAIDS. Patient denies any abdominal pain nausea or vomiting. Per family patient has been nonambulatory in the last 1 week due to acute back pain from newly diagnosed L1 pathological compression fracture. 10 days ago he was ambulating with a cane and had gone on a cruise.  Assessment & Plan:   Principal Problem:   Acute lower GI bleeding Active Problems:   HTN (hypertension)   Metastatic renal cell carcinoma (HCC)   L1 Metastasis   Dehydration   GI bleed  Principal Problem:   Acute lower GI bleeding - Hemoglobin currently 8.1. Stable - Discussed case with  - Check CBC in AM  Active Problems:  Hypotension with underlying history of HTN (hypertension) - BP presently stable - lisinopril, Coreg on hold    Metastatic renal cell carcinoma (HCC) with L1 metastatic disease, compression fracture, intractable back pain - Patient is s/p nephrectomy in 2016 and follows Derek [name]. Outpatient records reviewed. Patient is currently undergoing chemo. Given below findings, will hold chemo for the time being - Hypoplasty initially planned as outpatient - Most recent MRI reportedly  with evidence of colonic fistula formation. 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 - Called Neurosurgeon on call (Derek [name]), who does not recommend neurosurgery at this time - Discussed case with ID who will formally evaluate patient. In the meantime, ID recommends IR guided aspirate for culture purposes - Discussed with IR, who does not recommend aspirate for culture purposes. Recommends Surgical consultation - Discussed case with General Surgery who recommends drain placement and involvement with Neurosurgery - Updated patient's wife over phone. Patient currently Full Code. Family is agreeable to Goals of Care conference. Have consulted Palliative Care for Goals of Care.    Hyponatremia with Dehydration - Patient is continued on IVF - Stable this AM  Bladder outlet obstruction -New finding today -Place catheter -Check bmet in AM  DVT prophylaxis: SCD's Code Status: Full Family Communication: Pt in room, Family at bedside Disposition Plan: Uncertain at this time  Consultants:   Neurosurgery  ID  IR  General Surgery  Palliative Care  Procedures:     Antimicrobials: Anti-infectives    None       Subjective: Lethargic, receiving IV analgesics, arousabel  Objective: Vitals:   02/08/16 2108 02/09/16 0604 02/09/16 0638 02/09/16 1338  BP: (!) 141/80 124/63  129/64  Pulse: 85 90  78  Resp: 20 20  19   Temp: 99.1 F (37.3 C) 98.6 F (37 C)  99.7 F (37.6 C)  TempSrc: Oral Oral  Axillary  SpO2: 100% 90% 99% 97%  Weight:      Height:        Intake/Output Summary (Last 24  hours) at 02/09/16 1457 Last data filed at 02/09/16 1340  Gross per 24 hour  Intake          1398.75 ml  Output              600 ml  Net           798.75 ml   Filed Weights   02/08/16 0640  Weight: 73 kg (161 lb)    Examination:  General exam: lethargic, arousable, appears to be in acute pain Respiratory  system: Clear to auscultation. Respiratory effort normal. Cardiovascular system: S1 & S2 heard, RRR Gastrointestinal system: Abdomen is nondistended, soft and nontender. No organomegaly or masses felt. Normal bowel sounds heard. Central nervous system: Alert and oriented. No focal neurological deficits. Extremities: Symmetric 5 x 5 power. Skin: No rashes, lesions= Psychiatry: Judgement and insight appear normal. Mood & affect appropriate.   Data Reviewed: I have personally reviewed following labs and imaging studies  CBC:  Recent Labs Lab 02/04/16 1116  02/08/16 0716 02/08/16 1050 02/08/16 1640 02/08/16 2232 02/09/16 0514 02/09/16 1035  WBC 7.2  --  9.4  --   --   --  10.1  --   NEUTROABS 6.3  --  8.5*  --   --   --   --   --   HGB 11.3*  < > 8.6* 8.5* 8.6* 8.2* 8.1* 8.1*  HCT 34.4*  < > 24.6* 24.2* 24.4* 23.2* 23.1* 23.0*  MCV 96.4  --  92.5  --   --   --  92.8  --   PLT 280  --  277  --   --   --  291  --   < > = values in this interval not displayed. Basic Metabolic Panel:  Recent Labs Lab 02/04/16 1116 02/08/16 0716 02/09/16 0514  NA 128* 127* 128*  K 5.1 4.4 4.5  CL  --  91* 95*  CO2 27 26 27   GLUCOSE 119 135* 111*  BUN 16.0 25* 18  CREATININE 0.9 0.92 0.77  CALCIUM 9.8 8.0* 8.0*   GFR: Estimated Creatinine Clearance: 86.7 mL/min (by C-G formula based on SCr of 0.77 mg/dL). Liver Function Tests:  Recent Labs Lab 02/04/16 1116 02/08/16 0716  AST 17 19  ALT 13 14*  ALKPHOS 88 75  BILITOT 0.65 0.7  PROT 7.4 5.9*  ALBUMIN 2.5* 2.3*   No results for input(s): LIPASE, AMYLASE in the last 168 hours. No results for input(s): AMMONIA in the last 168 hours. Coagulation Profile: No results for input(s): INR, PROTIME in the last 168 hours. Cardiac Enzymes: No results for input(s): CKTOTAL, CKMB, CKMBINDEX, TROPONINI in the last 168 hours. BNP (last 3 results) No results for input(s): PROBNP in the last 8760 hours. HbA1C: No results for input(s): HGBA1C in  the last 72 hours. CBG: No results for input(s): GLUCAP in the last 168 hours. Lipid Profile: No results for input(s): CHOL, HDL, LDLCALC, TRIG, CHOLHDL, LDLDIRECT in the last 72 hours. Thyroid Function Tests: No results for input(s): TSH, T4TOTAL, FREET4, T3FREE, THYROIDAB in the last 72 hours. Anemia Panel: No results for input(s): VITAMINB12, FOLATE, FERRITIN, TIBC, IRON, RETICCTPCT in the last 72 hours. Sepsis Labs:  Recent Labs Lab 02/08/16 0725  LATICACIDVEN 1.51    No results found for this or any previous visit (from the past 240 hour(s)).   Radiology Studies: Ct Abdomen Pelvis W Contrast  Addendum Date: 02/08/2016   ADDENDUM REPORT: 02/08/2016 14:44 ADDENDUM: These results will be called  to the ordering clinician or representative by the Radiologist Assistant, and communication documented in the PACS or zVision Dashboard. Electronically Signed   By: Marijo Conception, M.D.   On: 02/08/2016 14:44   Result Date: 02/08/2016 CLINICAL DATA:  History of metastatic renal cell carcinoma. Rectal bleeding. EXAM: CT ABDOMEN AND PELVIS WITH CONTRAST TECHNIQUE: Multidetector CT imaging of the abdomen and pelvis was performed using the standard protocol following bolus administration of intravenous contrast. CONTRAST:  123mL ISOVUE-300 IOPAMIDOL (ISOVUE-300) INJECTION 61% COMPARISON:  CT scan of November 16, 2015.  MRI of October 17, 2014. FINDINGS: Lower chest: Minimal bilateral pleural effusions are noted with adjacent subsegmental atelectasis. Hepatobiliary: Minimal cholelithiasis is noted. Enhancing 17 mm rounded abnormality is seen superiorly in right hepatic lobe concerning for possible metastatic lesion. This appears to be enlarged compared to prior MRI. Status post surgical resection of lesions seen peripherally in right hepatic lobe on prior MRI. Pancreas: Unremarkable. No pancreatic ductal dilatation or surrounding inflammatory changes. Spleen: Normal in size without focal abnormality.  Adrenals/Urinary Tract: Adrenal glands are unremarkable. 6.9 cm simple cyst is seen arising from upper pole of left kidney. No hydronephrosis or renal obstruction is noted. Urinary bladder appears normal. Status post right nephrectomy. Stomach/Bowel: There is a large area of inflammation and soft tissue gas which extends from the hepatic flexure medially and into the L1 vertebral body, which demonstrates severe pathologic fracture. This gas appears to communicate with the lumen of the hepatic flexure. This abnormality appears to extend into the inferior portion of right hepatic lobe. There is no evidence of bowel obstruction. Vascular/Lymphatic: Aortic atherosclerosis. No enlarged abdominal or pelvic lymph nodes. Reproductive: Stable mild prostatic enlargement is noted. Other: No hernia is noted. Musculoskeletal: As noted previously, severe wedge compression deformity of L1 vertebral body is noted secondary to pathologic fracture, with large amount of soft tissue gas within the right-sided the vertebral body and extending in the perispinal region consistent with infection. Lytic destruction of L2 vertebral body is noted concerning for metastatic disease. IMPRESSION: Enhancing 17 mm rounded abnormality noted superiorly in right hepatic lobe concerning for metastatic lesion. This appears to be enlarged compared to prior studies. MRI is recommended for further evaluation. Lytic destruction of L2 vertebral body consistent with metastatic lesion. Large ill-defined area of soft tissue inflammation, fluid and gas is seen extending from hepatic flexure through right perispinal tissues into L1 vertebral body. L1 vertebral body has undergone severe compression deformity secondary to pathologic fracture. This abnormality does appear to extend into the medial and inferior portion of right hepatic lobe. 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 Electronically Signed: By: Marijo Conception, M.D. On: 02/08/2016 14:40    Scheduled Meds: . docusate sodium  100 mg Oral BID  . febuxostat  40 mg Oral Daily  . LORazepam  0.5-1 mg Oral QHS  . pantoprazole (PROTONIX) IV  40 mg Intravenous Q12H   Continuous Infusions: . sodium chloride 75 mL/hr (02/09/16 1309)     LOS: 1 day   CHIU, Orpah Melter, MD [facility] Pager [phone]  If 7PM-7AM, please contact night-coverage [email or web address] Password [email or web address] 02/09/2016, 2:57 PM

## 2016-02-09 NOTE — Progress Notes (Signed)
Pt was in bed resting, but awake, when I arrived. His son was bedside and said they had a minister to visit earlier. Pt said he was doing ok and was thankful for the visit. CH offered further support if needed. Please page if need arises.  Ernest Haber, M.Div.   02/09/16 1500  Clinical Encounter Type  Visited With Patient and family together

## 2016-02-09 NOTE — Progress Notes (Signed)
IR initially asked to evaluate for kyphoplasty of symptomatic L1 fracture. Pt with hx of renal cell cancer and prior nephrectomy. Was referred by Dr. Alen Blew to Dr. Estanislado Pandy to consult for possible L1 KP as an outpt. Pt now admitted with sxs of GI bleed and severe back pain. Upon review of most recent MRI and comparing prior imaging over the past few months, there has been interval worsening of the L1 pathologic fracture and high concern for developing fistula to the adjacent colon at the hepatic flexure.  Advised admitting team to get CT A/P with contrast which has been completed. Review of CT confirms presence of significant destructive changes of the L1 vertebrae with obvious fistula/abscess to the adjacent colon. Air and stool apparent in the vertebrae with surrounding inflammatory changes. Also suspect this is the site/source of GI bleeding. This patient is not a candidate for Kypho plasty in this setting. Recommend general surgery and/or neurosurgery consult   Ascencion Dike PA-C Interventional Radiology 02/09/2016 1:15 PM

## 2016-02-09 NOTE — Consult Note (Signed)
Reason for Consult: inflammatory process in right retroperitoneum Referring Physician:  Dr. Ellwood Sayers is an 65 y.o. male.  HPI:  This is a 65 year old male with metastatic and recurrent kidney cancer. In September 2016, he underwent a radical right nephrectomy and lymph node dissection and partial right hepatectomy.  He developed a metastasis to L1 as well as recurrence in the nephrectomy bed.  He's been treated with some chemotherapy and radiation therapy to the L1 area. Over the past week he is developed severe back pain. He has known compression fractures of L1 and L2. He was taking a lot of narcotics and also developed opioid-induced constipation. He had some rectal bleeding while trying to have a bowel movement. Kyphoplasty was recommended to help control the pain. Prior to that, recommendation was made for CT of the abdomen and pelvis with the findings as below. Because of these findings we were asked to see him. He denies any fever or chills. Does not have much of an appetite. Denies abdominal pain. Multiple family members are in the room with him.  Past Medical History:  Diagnosis Date  . Arthritis   . Bone cancer ([place])    rcc with L1 pathologic compression fracture  . Cancer [facility])    rcc with L1 pathologic compression fx.  . Gout   . Hypertension   . NSTEMI (non-ST elevated myocardial infarction) ([place])   . Renal cell carcinoma ([place]) 11/28/2014  . Renal cell carcinoma ([place])   . Shingles 7/15    Past Surgical History:  Procedure Laterality Date  . CORONARY ARTERY BYPASS GRAFT N/A 01/27/2014   Procedure: CORONARY ARTERY BYPASS GRAFTING (CABG), ON PUMP, TIMES FOUR, USING LEFT INTERNAL MAMMARY ARTERY, RIGHT GREATER SAPHENOUS VEIN HARVESTED ENDOSCOPICALLY;  Surgeon: Gaye Pollack, MD;  Location: [place];  Service: Open Heart Surgery;  Laterality: N/A;  LIMA-LAD; SVG-OM; SVG-PD; SVG-DIAG  . KNEE SURGERY    . LEFT HEART CATHETERIZATION WITH CORONARY ANGIOGRAM N/A 01/23/2014    Procedure: LEFT HEART CATHETERIZATION WITH CORONARY ANGIOGRAM;  Surgeon: Blane Ohara, MD;  Location: [facility] CATH LAB;  Service: Cardiovascular;  Laterality: N/A;  . NEPHRECTOMY Right 11/28/2014   Procedure: RIGHT OPEN NEPHRECTOMY;  Surgeon: Cleon Gustin, MD;  Location: [place];  Service: Urology;  Laterality: Right;  . OPEN PARTIAL HEPATECTOMY  N/A 11/28/2014   Procedure: OPEN PARTIAL HEPATECTOMY;  Surgeon: [name], MD;  Location: [place];  Service: General;  Laterality: N/A;  . TEE WITHOUT CARDIOVERSION N/A 01/27/2014   Procedure: TRANSESOPHAGEAL ECHOCARDIOGRAM (TEE);  Surgeon: Gaye Pollack, MD;  Location: [place];  Service: Open Heart Surgery;  Laterality: N/A;    Family History  Problem Relation Age of Onset  . Lung cancer Mother   . Lung cancer Father   . Bone cancer Maternal Grandfather     Social History:  reports that he has never smoked. He has never used smokeless tobacco. He reports that he drinks alcohol. He reports that he does not use drugs.  Allergies:  Allergies  Allergen Reactions  . Lipitor [Atorvastatin] Other (See Comments)    Myopathy...severe, with  Myoglobinuria, wheelchair bound for a few days  . Penicillins Anaphylaxis    As a child Has patient had a PCN reaction causing immediate rash, facial/tongue/throat swelling, SOB or lightheadedness with hypotension: yes Has patient had a PCN reaction causing severe rash involving mucus membranes or skin necrosis: yes Has patient had a PCN reaction that required hospitalization: yes Has patient had a  PCN reaction occurring within the last 10 years: no If all of the above answers are "NO", then may proceed with Cephalosporin use.   . Prednisone Anxiety and Other (See Comments)    Extremely high blood pressure after taking medication for first time.     Current Facility-Administered Medications:  .  0.9 %  sodium chloride infusion, , Intravenous, Continuous, Ripudeep K Rai, MD, Last Rate: 75 mL/hr at 02/09/16  1309, 75 mL/hr at 02/09/16 1309 .  acetaminophen (TYLENOL) tablet 650 mg, 650 mg, Oral, Q6H PRN **OR** acetaminophen (TYLENOL) suppository 650 mg, 650 mg, Rectal, Q6H PRN, Ripudeep K Rai, MD .  ceFEPIme (MAXIPIME) 2 g in dextrose 5 % 50 mL IVPB, 2 g, Intravenous, Q8H, [name], RPH .  diphenoxylate-atropine (LOMOTIL) 2.5-0.025 MG per tablet 2 tablet, 2 tablet, Oral, QID PRN, Ripudeep K Rai, MD .  docusate sodium (COLACE) capsule 100 mg, 100 mg, Oral, BID, Ripudeep K Rai, MD .  febuxostat (ULORIC) tablet 40 mg, 40 mg, Oral, Daily, Ripudeep K Rai, MD, 40 mg at 02/08/16 1210 .  HYDROmorphone (DILAUDID) injection 2 mg, 2 mg, Intravenous, Q3H PRN, Ripudeep K Rai, MD, 2 mg at 02/09/16 1926 .  LORazepam (ATIVAN) tablet 0.5-1 mg, 0.5-1 mg, Oral, QHS, Ritta Slot, NP, 0.5 mg at 02/08/16 2145 .  magic mouthwash, 5 mL, Oral, TID PRN, Ripudeep K Rai, MD .  ondansetron (ZOFRAN) tablet 4 mg, 4 mg, Oral, Q6H PRN **OR** ondansetron (ZOFRAN) injection 4 mg, 4 mg, Intravenous, Q6H PRN, Ripudeep K Rai, MD .  pantoprazole (PROTONIX) injection 40 mg, 40 mg, Intravenous, Q12H, Ripudeep K Rai, MD, 40 mg at 02/09/16 1517 .  vancomycin (VANCOCIN) IVPB 1000 mg/200 mL premix, 1,000 mg, Intravenous, Q12H, [name], RPH, 1,000 mg at 02/09/16 1839   Results for orders placed or performed during the hospital encounter of 02/08/16 (from the past 48 hour(s))  POC occult blood, ED Provider will collect     Status: Abnormal   Collection Time: 02/08/16  6:57 AM  Result Value Ref Range   Fecal Occult Bld POSITIVE (A) NEGATIVE  ABO/Rh     Status: None   Collection Time: 02/08/16  7:11 AM  Result Value Ref Range   ABO/RH(D) AB POS   Comprehensive metabolic panel     Status: Abnormal   Collection Time: 02/08/16  7:16 AM  Result Value Ref Range   Sodium 127 (L) 135 - 145 mmol/L   Potassium 4.4 3.5 - 5.1 mmol/L   Chloride 91 (L) 101 - 111 mmol/L   CO2 26 22 - 32 mmol/L   Glucose, Bld 135 (H) 65 - 99 mg/dL    BUN 25 (H) 6 - 20 mg/dL   Creatinine, Ser 0.92 0.61 - 1.24 mg/dL   Calcium 8.0 (L) 8.9 - 10.3 mg/dL   Total Protein 5.9 (L) 6.5 - 8.1 g/dL   Albumin 2.3 (L) 3.5 - 5.0 g/dL   AST 19 15 - 41 U/L   ALT 14 (L) 17 - 63 U/L   Alkaline Phosphatase 75 38 - 126 U/L   Total Bilirubin 0.7 0.3 - 1.2 mg/dL   GFR calc non Af Amer >60 >60 mL/min   GFR calc Af Amer >60 >60 mL/min    Comment: (NOTE) The eGFR has been calculated using the CKD EPI equation. This calculation has not been validated in all clinical situations. eGFR's persistently <60 mL/min signify possible Chronic Kidney Disease.    Anion gap 10 5 - 15  CBC with Differential     Status: Abnormal   Collection Time: 02/08/16  7:16 AM  Result Value Ref Range   WBC 9.4 4.0 - 10.5 K/uL   RBC 2.66 (L) 4.22 - 5.81 MIL/uL   Hemoglobin 8.6 (L) 13.0 - 17.0 g/dL   HCT 24.6 (L) 39.0 - 52.0 %   MCV 92.5 78.0 - 100.0 fL   MCH 32.3 26.0 - 34.0 pg   MCHC 35.0 30.0 - 36.0 g/dL   RDW 14.1 11.5 - 15.5 %   Platelets 277 150 - 400 K/uL   Neutrophils Relative % 91 %   Neutro Abs 8.5 (H) 1.7 - 7.7 K/uL   Lymphocytes Relative 4 %   Lymphs Abs 0.3 (L) 0.7 - 4.0 K/uL   Monocytes Relative 5 %   Monocytes Absolute 0.5 0.1 - 1.0 K/uL   Eosinophils Relative 0 %   Eosinophils Absolute 0.0 0.0 - 0.7 K/uL   Basophils Relative 0 %   Basophils Absolute 0.0 0.0 - 0.1 K/uL  Type and screen [place]     Status: None   Collection Time: 02/08/16  7:16 AM  Result Value Ref Range   ABO/RH(D) AB POS    Antibody Screen NEG    Sample Expiration 02/11/2016   I-Stat CG4 Lactic Acid, ED     Status: None   Collection Time: 02/08/16  7:25 AM  Result Value Ref Range   Lactic Acid, Venous 1.51 0.5 - 1.9 mmol/L  Hemoglobin and hematocrit, blood     Status: Abnormal   Collection Time: 02/08/16 10:50 AM  Result Value Ref Range   Hemoglobin 8.5 (L) 13.0 - 17.0 g/dL   HCT 24.2 (L) 39.0 - 52.0 %  Hemoglobin and hematocrit, blood     Status: Abnormal    Collection Time: 02/08/16  4:40 PM  Result Value Ref Range   Hemoglobin 8.6 (L) 13.0 - 17.0 g/dL   HCT 24.4 (L) 39.0 - 52.0 %  Urinalysis, Routine w reflex microscopic     Status: Abnormal   Collection Time: 02/08/16  7:29 PM  Result Value Ref Range   Color, Urine YELLOW YELLOW   APPearance CLEAR CLEAR   Specific Gravity, Urine >1.046 (H) 1.005 - 1.030   pH 5.0 5.0 - 8.0   Glucose, UA NEGATIVE NEGATIVE mg/dL   Hgb urine dipstick SMALL (A) NEGATIVE   Bilirubin Urine NEGATIVE NEGATIVE   Ketones, ur 20 (A) NEGATIVE mg/dL   Protein, ur NEGATIVE NEGATIVE mg/dL   Nitrite NEGATIVE NEGATIVE   Leukocytes, UA NEGATIVE NEGATIVE   RBC / HPF 0-5 0 - 5 RBC/hpf   WBC, UA 0-5 0 - 5 WBC/hpf   Bacteria, UA RARE (A) NONE SEEN   Squamous Epithelial / LPF 0-5 (A) NONE SEEN   Mucous PRESENT   Hemoglobin and hematocrit, blood     Status: Abnormal   Collection Time: 02/08/16 10:32 PM  Result Value Ref Range   Hemoglobin 8.2 (L) 13.0 - 17.0 g/dL   HCT 23.2 (L) 39.0 - 40.8 %  Basic metabolic panel     Status: Abnormal   Collection Time: 02/09/16  5:14 AM  Result Value Ref Range   Sodium 128 (L) 135 - 145 mmol/L   Potassium 4.5 3.5 - 5.1 mmol/L   Chloride 95 (L) 101 - 111 mmol/L   CO2 27 22 - 32 mmol/L   Glucose, Bld 111 (H) 65 - 99 mg/dL   BUN 18 6 - 20 mg/dL   Creatinine,  Ser 0.77 0.61 - 1.24 mg/dL   Calcium 8.0 (L) 8.9 - 10.3 mg/dL   GFR calc non Af Amer >60 >60 mL/min   GFR calc Af Amer >60 >60 mL/min    Comment: (NOTE) The eGFR has been calculated using the CKD EPI equation. This calculation has not been validated in all clinical situations. eGFR's persistently <60 mL/min signify possible Chronic Kidney Disease.    Anion gap 6 5 - 15  CBC     Status: Abnormal   Collection Time: 02/09/16  5:14 AM  Result Value Ref Range   WBC 10.1 4.0 - 10.5 K/uL   RBC 2.49 (L) 4.22 - 5.81 MIL/uL   Hemoglobin 8.1 (L) 13.0 - 17.0 g/dL   HCT 23.1 (L) 39.0 - 52.0 %   MCV 92.8 78.0 - 100.0 fL   MCH  32.5 26.0 - 34.0 pg   MCHC 35.1 30.0 - 36.0 g/dL   RDW 14.4 11.5 - 15.5 %   Platelets 291 150 - 400 K/uL  Hemoglobin and hematocrit, blood     Status: Abnormal   Collection Time: 02/09/16 10:35 AM  Result Value Ref Range   Hemoglobin 8.1 (L) 13.0 - 17.0 g/dL   HCT 23.0 (L) 39.0 - 52.0 %    Ct Abdomen Pelvis W Contrast  Addendum Date: 02/08/2016   ADDENDUM REPORT: 02/08/2016 14:44 ADDENDUM: These results will be called to the ordering clinician or representative by the Radiologist Assistant, and communication documented in the PACS or zVision Dashboard. Electronically Signed   By: Marijo Conception, M.D.   On: 02/08/2016 14:44   Result Date: 02/08/2016 CLINICAL DATA:  History of metastatic renal cell carcinoma. Rectal bleeding. EXAM: CT ABDOMEN AND PELVIS WITH CONTRAST TECHNIQUE: Multidetector CT imaging of the abdomen and pelvis was performed using the standard protocol following bolus administration of intravenous contrast. CONTRAST:  165m ISOVUE-300 IOPAMIDOL (ISOVUE-300) INJECTION 61% COMPARISON:  CT scan of November 16, 2015.  MRI of October 17, 2014. FINDINGS: Lower chest: Minimal bilateral pleural effusions are noted with adjacent subsegmental atelectasis. Hepatobiliary: Minimal cholelithiasis is noted. Enhancing 17 mm rounded abnormality is seen superiorly in right hepatic lobe concerning for possible metastatic lesion. This appears to be enlarged compared to prior MRI. Status post surgical resection of lesions seen peripherally in right hepatic lobe on prior MRI. Pancreas: Unremarkable. No pancreatic ductal dilatation or surrounding inflammatory changes. Spleen: Normal in size without focal abnormality. Adrenals/Urinary Tract: Adrenal glands are unremarkable. 6.9 cm simple cyst is seen arising from upper pole of left kidney. No hydronephrosis or renal obstruction is noted. Urinary bladder appears normal. Status post right nephrectomy. Stomach/Bowel: There is a large area of inflammation and  soft tissue gas which extends from the hepatic flexure medially and into the L1 vertebral body, which demonstrates severe pathologic fracture. This gas appears to communicate with the lumen of the hepatic flexure. This abnormality appears to extend into the inferior portion of right hepatic lobe. There is no evidence of bowel obstruction. Vascular/Lymphatic: Aortic atherosclerosis. No enlarged abdominal or pelvic lymph nodes. Reproductive: Stable mild prostatic enlargement is noted. Other: No hernia is noted. Musculoskeletal: As noted previously, severe wedge compression deformity of L1 vertebral body is noted secondary to pathologic fracture, with large amount of soft tissue gas within the right-sided the vertebral body and extending in the perispinal region consistent with infection. Lytic destruction of L2 vertebral body is noted concerning for metastatic disease. IMPRESSION: Enhancing 17 mm rounded abnormality noted superiorly in right hepatic lobe concerning for  metastatic lesion. This appears to be enlarged compared to prior studies. MRI is recommended for further evaluation. Lytic destruction of L2 vertebral body consistent with metastatic lesion. Large ill-defined area of soft tissue inflammation, fluid and gas is seen extending from hepatic flexure through right perispinal tissues into L1 vertebral body. L1 vertebral body has undergone severe compression deformity secondary to pathologic fracture. This abnormality does appear to extend into the medial and inferior portion of right hepatic lobe. 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 Electronically Signed: By: Marijo Conception, M.D. On: 02/08/2016 14:40    Review of Systems  Constitutional: Negative for chills and fever.  Gastrointestinal: Positive for blood in stool and constipation. Negative for abdominal pain, nausea and vomiting.   Musculoskeletal: Positive for back pain.  Neurological: Positive for weakness.   Blood pressure 129/64, pulse 78, temperature 99.7 F (37.6 C), temperature source Axillary, resp. rate 19, height 5' 8"  (1.727 m), weight 73 kg (161 lb), SpO2 97 %. Physical Exam  Constitutional:  Weak appearing male who is in no acute distress.  Eyes: No scleral icterus.  Cardiovascular: Normal rate and regular rhythm.   Respiratory: Effort normal and breath sounds normal.  Midsternal scar.  GI: Soft. He exhibits no mass. There is tenderness (mild and right upper quadrant, right flank, epigastric region.).  Right upper quadrant scar without hernia.  Musculoskeletal: He exhibits edema.  Lymphadenopathy:    He has no cervical adenopathy.  Neurological: He is alert.  Skin: There is pallor.    Assessment/Plan: Complex inflammatory phlegmon process right retroperitoneum. This could be secondary to necrotic tumor and infection versus microperforation of the hepatic flexure of colon and surrounding inflammatory process. I spoke with the radiologist and there is not a definite fluid collection to sample. I spoke with Dr. Arnoldo Morale of neurosurgery about this as well.  Recommendation: Broad-spectrum antibiotics. Repeat CT scan in 3-4 days.  Loyed Wilmes J 02/09/2016, 7:48 PM

## 2016-02-09 NOTE — Progress Notes (Signed)
Pharmacy Antibiotic Note  Derek [name] is a 65 y.o. male with hx of metastatic renal carcinoma, HTN, CAD, s/p recent L1 pathologic fracture/infection admitted on 02/08/2016 with paraspinal abscess. Anaphylaxis to PCN per EPIC but pt reports tolerating Keflex in past. Pharmacy has been consulted for Cefepime/Vancomycin dosing.  Plan: Cefepime 2 Gm IV q8h Vancomycin 1 Gm IV q12h  VT=15-20 mg/L  Height: 5\' 8"  (172.7 cm) Weight: 161 lb (73 kg) IBW/kg (Calculated) : 68.4  Temp (24hrs), Avg:99.1 F (37.3 C), Min:98.6 F (37 C), Max:99.7 F (37.6 C)   Recent Labs Lab 02/04/16 1116 02/04/16 1116 02/08/16 0716 02/08/16 0725 02/09/16 0514  WBC 7.2  --  9.4  --  10.1  CREATININE  --  0.9 0.92  --  0.77  LATICACIDVEN  --   --   --  1.51  --     Estimated Creatinine Clearance: 86.7 mL/min (by C-G formula based on SCr of 0.77 mg/dL).    Allergies  Allergen Reactions  . Lipitor [Atorvastatin] Other (See Comments)    Myopathy...severe, with  Myoglobinuria, wheelchair bound for a few days  . Penicillins Anaphylaxis    As a child Has patient had a PCN reaction causing immediate rash, facial/tongue/throat swelling, SOB or lightheadedness with hypotension: yes Has patient had a PCN reaction causing severe rash involving mucus membranes or skin necrosis: yes Has patient had a PCN reaction that required hospitalization: yes Has patient had a PCN reaction occurring within the last 10 years: no If all of the above answers are "NO", then may proceed with Cephalosporin use.   . Prednisone Anxiety and Other (See Comments)    Extremely high blood pressure after taking medication for first time.    Antimicrobials this admission: 12/9 cefepime >>  12/9 vancomycin >>   Dose adjustments this admission:   Microbiology results:  BCx:   UCx:    Sputum:    MRSA PCR:   Thank you for allowing pharmacy to be a part of this patient's care.  [name] 02/09/2016 6:03 PM

## 2016-02-09 NOTE — Progress Notes (Signed)
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  They are very clear in wishes for full aggressive care.    They are very upset/frustrated this evening during my encounter. Perception is that "nothing is happening."  Family state that they want him to have some sort of procedure to attempt to correct fistula.  "We can accept if he dies during a surgery or from complications.  We can't accept sitting here watching him die and that's what we are doing."  They are not interested in discussing any options other than full aggressive care.  Full consult to follow.  Micheline Rough, MD [place] Team [phone]

## 2016-02-10 DIAGNOSIS — R634 Abnormal weight loss: Secondary | ICD-10-CM | POA: Diagnosis present

## 2016-02-10 DIAGNOSIS — Z880 Allergy status to penicillin: Secondary | ICD-10-CM

## 2016-02-10 DIAGNOSIS — Z9221 Personal history of antineoplastic chemotherapy: Secondary | ICD-10-CM

## 2016-02-10 DIAGNOSIS — Z85528 Personal history of other malignant neoplasm of kidney: Secondary | ICD-10-CM

## 2016-02-10 DIAGNOSIS — I2510 Atherosclerotic heart disease of native coronary artery without angina pectoris: Secondary | ICD-10-CM | POA: Diagnosis present

## 2016-02-10 DIAGNOSIS — Z808 Family history of malignant neoplasm of other organs or systems: Secondary | ICD-10-CM

## 2016-02-10 DIAGNOSIS — Z905 Acquired absence of kidney: Secondary | ICD-10-CM

## 2016-02-10 DIAGNOSIS — M4856XA Collapsed vertebra, not elsewhere classified, lumbar region, initial encounter for fracture: Secondary | ICD-10-CM

## 2016-02-10 DIAGNOSIS — E46 Unspecified protein-calorie malnutrition: Secondary | ICD-10-CM | POA: Diagnosis present

## 2016-02-10 DIAGNOSIS — K668 Other specified disorders of peritoneum: Secondary | ICD-10-CM

## 2016-02-10 DIAGNOSIS — Z923 Personal history of irradiation: Secondary | ICD-10-CM

## 2016-02-10 DIAGNOSIS — Z888 Allergy status to other drugs, medicaments and biological substances status: Secondary | ICD-10-CM

## 2016-02-10 DIAGNOSIS — Z801 Family history of malignant neoplasm of trachea, bronchus and lung: Secondary | ICD-10-CM

## 2016-02-10 DIAGNOSIS — Z9089 Acquired absence of other organs: Secondary | ICD-10-CM

## 2016-02-10 LAB — BASIC METABOLIC PANEL WITH GFR
Anion gap: 7 (ref 5–15)
BUN: 15 mg/dL (ref 6–20)
CO2: 26 mmol/L (ref 22–32)
Calcium: 7.9 mg/dL — ABNORMAL LOW (ref 8.9–10.3)
Chloride: 97 mmol/L — ABNORMAL LOW (ref 101–111)
Creatinine, Ser: 0.9 mg/dL (ref 0.61–1.24)
GFR calc Af Amer: >60 mL/min
GFR calc non Af Amer: >60 mL/min
Glucose, Bld: 112 mg/dL — ABNORMAL HIGH (ref 65–99)
Potassium: 3.9 mmol/L (ref 3.5–5.1)
Sodium: 130 mmol/L — ABNORMAL LOW (ref 135–145)

## 2016-02-10 LAB — CBC
HEMATOCRIT: 22.1 % — AB (ref 39.0–52.0)
HEMOGLOBIN: 7.6 g/dL — AB (ref 13.0–17.0)
MCH: 31.9 pg (ref 26.0–34.0)
MCHC: 34.4 g/dL (ref 30.0–36.0)
MCV: 92.9 fL (ref 78.0–100.0)
Platelets: 311 10*3/uL (ref 150–400)
RBC: 2.38 MIL/uL — ABNORMAL LOW (ref 4.22–5.81)
RDW: 14.4 % (ref 11.5–15.5)
WBC: 8.6 10*3/uL (ref 4.0–10.5)

## 2016-02-10 MED ORDER — SODIUM CHLORIDE 0.9 % IV SOLN
1.0000 g | Freq: Three times a day (TID) | INTRAVENOUS | Status: DC
  Administered 2016-02-10 – 2016-02-14 (×12): 1 g via INTRAVENOUS
  Filled 2016-02-10 (×15): qty 1

## 2016-02-10 NOTE — Progress Notes (Signed)
Pharmacy Antibiotic Note  Derek [name] is a 65 y.o. 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  Pharmacy was initially consulted for Cefepime/Vancomycin dosing, now changed to Meropenem/Vancomycin per ID consult.  Plan:  Meropenem 1g IV q8h.  Continue Vancomycin 1g IV q12h  Measure Vanc trough at steady state.  Goal VT 15-20 mcg/mL.  Follow up renal fxn, culture results, and clinical course.  Height: 5\' 8"  (172.7 cm) Weight: 161 lb (73 kg) IBW/kg (Calculated) : 68.4  Temp (24hrs), Avg:99 F (37.2 C), Min:98.3 F (36.8 C), Max:99.7 F (37.6 C)   Recent Labs Lab 02/04/16 1116 02/04/16 1116 02/08/16 0716 02/08/16 0725 02/09/16 0514 02/10/16 0533  WBC 7.2  --  9.4  --  10.1 8.6  CREATININE  --  0.9 0.92  --  0.77 0.90  LATICACIDVEN  --   --   --  1.51  --   --     Estimated Creatinine Clearance: 77.1 mL/min (by C-G formula based on SCr of 0.9 mg/dL).    Allergies  Allergen Reactions  . Lipitor [Atorvastatin] Other (See Comments)    Myopathy...severe, with  Myoglobinuria, wheelchair bound for a few days  . Penicillins Anaphylaxis    As a child Has patient had a PCN reaction causing immediate rash, facial/tongue/throat swelling, SOB or lightheadedness with hypotension: yes Has patient had a PCN reaction causing severe rash involving mucus membranes or skin necrosis: yes Has patient had a PCN reaction that required hospitalization: yes Has patient had a PCN reaction occurring within the last 10 years: no If all of the above answers are "NO", then may proceed with Cephalosporin use.   . Prednisone Anxiety and Other (See Comments)    Extremely high blood pressure after taking medication for first time.    Antimicrobials this admission: 12/9 cefepime >> 12/10 12/9 vancomycin >>  12/10 meropenem  >>  Dose adjustments this admission:   Microbiology results: 12/8 UCx: sent   Thank you for allowing pharmacy to be a part of this patient's care.  Gretta Arab PharmD, BCPS Pager [phone] 02/10/2016 11:00 AM

## 2016-02-10 NOTE — Progress Notes (Addendum)
Disucussed in great detail regarding use of medicationcabozantinib

## 2016-02-10 NOTE — Consult Note (Signed)
Consultation Note Date: 02/10/2016   Patient Name: Derek [name]  DOB: May 25, 1950  MRN: [id number]  Age / Sex: 65 y.o., male  PCP: Mackie Pai, PA-C Referring Physician: Donne Hazel, MD  Reason for Consultation: Establishing goals of care  HPI/Patient Profile:65 y.o. male  with past medical history of stage IV renal cancer s/p R nephrectomy, radiation, and chemo.  He has known history of L1 mets.  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  Palliative consulted for goals of care.  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  He lies throughout mostly encounter with his eyes close but listens and would answer in one or 2 word answers when asked specific questions.  Family reports the most important thing to him is his family and "getting better."  His family reports a great deal of frustration as they feel that they have not gotten a clear understanding of what the plan is moving forward with his care. His wife relates that she feels she has been told multiple things by different doctors as all of the specialist involved in his care work to develop a plan. Her perception is that "everyone comes in here to tell us what they can't do. Nobody will tell us what we can do."  We discussed his clinical course over the past several months and then specifically over the past several hours to days. His family states that they understand he is very ill but he is somebody who will "die trying."  I spent a lot of the encounter providing supportive listening and working to educate family on the complexity of his medical situation. They're not  receptive to any conversation other than full aggressive care at this time. I do not anticipate that this is something that will change any point in the future based on our conversation today.  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  They report he is someone who will "die trying" and are not interested in discussing anything other than aggressive care moving forward. - There is a lot or frustration in family with perception that "nothing is happening."  They repeatedly request some sort of procedural intervention be performed.  I attempted to discuss that the goal of all the providers on his medical team is to determine what is the best care plan in light of a very complicated clinical situation while evaluating the risk vs benefit of any proposed intervention.  They are not receptive to this conversation at this time and state, "something needs to happen." - I will continue to follow-up to provide support to family.  I do not think that there will be a change in goal for aggressive care regardless of likely outcomes.  Code Status/Advance Care Planning:  Full code   Symptom Management:   Pain: Reports his pain is controlled on current regimen.  Continue same.  Palliative Prophylaxis:   Frequent Pain Assessment  Additional Recommendations (Limitations, Scope, Preferences):  Full Scope Treatment  Psycho-social/Spiritual:   Desire for further Chaplaincy support:no  Additional Recommendations: Caregiving  Support/Resources  Prognosis:   Unable to determine  Discharge Planning: To Be Determined      Primary Diagnoses: Present on Admission: . Acute lower GI bleeding . Dehydration . HTN (hypertension) . L1 Metastasis . Metastatic renal cell carcinoma ([place]) . Acute GI bleeding   I have reviewed the medical record, interviewed the patient and family, and examined the patient. The following aspects are  pertinent.  Past Medical History:  Diagnosis Date  . Arthritis   . Bone cancer ([place])    rcc with L1 pathologic compression fracture  . Cancer [facility])    rcc with L1 pathologic compression fx.  . Gout   . Hypertension   . NSTEMI (non-ST elevated myocardial infarction) ([place])   . Renal cell carcinoma ([place]) 11/28/2014  . Renal cell carcinoma ([place])   . Shingles 7/15   Social History   Social History  . Marital status: Married    Spouse name: N/A  . Number of children: N/A  . Years of education: N/A   Occupational History  . Dentist    Social History Main Topics  . Smoking status: Never Smoker  . Smokeless tobacco: Never Used  . Alcohol use Yes     Comment: 1-2 drinks/week  . Drug use: No  . Sexual activity: No   Other Topics Concern  . None   Social History Narrative   Lives with wife   Family History  Problem Relation Age of Onset  . Lung cancer Mother   . Lung cancer Father   . Bone cancer Maternal Grandfather    Scheduled Meds: . ceFEPime (MAXIPIME) IV  2 g Intravenous Q8H  . docusate sodium  100 mg Oral BID  . febuxostat  40 mg Oral Daily  . LORazepam  0.5-1 mg Oral QHS  . pantoprazole (PROTONIX) IV  40 mg Intravenous Q12H  . vancomycin  1,000 mg Intravenous Q12H   Continuous Infusions: . sodium chloride 75 mL/hr at 02/10/16 0159   PRN Meds:.acetaminophen **OR** acetaminophen, diphenoxylate-atropine, HYDROmorphone (DILAUDID) injection, magic mouthwash, ondansetron **OR** ondansetron (ZOFRAN) IV Medications Prior to Admission:  Prior to Admission medications   Medication Sig Start Date End Date Taking? Authorizing Provider  aspirin EC 81 MG tablet Take 81 mg by mouth daily.   Yes Historical Provider, MD  cabozantinib S-Malate (CABOMETYX) 40 MG TABS Take 1 tablet by mouth daily. 01/22/16  Yes Wyatt Portela, MD  carvedilol (COREG) 12.5 MG tablet Take 12.5 mg by mouth 2 (two) times daily with a meal.  08/17/15  Yes Historical Provider, MD  Cholecalciferol  (VITAMIN D-3) 5000 UNITS TABS Take 5,000 Units by mouth daily.    Yes Historical Provider, MD  colchicine 0.6 MG tablet Reported on 08/17/2015   Yes Historical Provider, MD  diphenoxylate-atropine (LOMOTIL) 2.5-0.025 MG tablet Take 2 tablets by mouth 4 (four) times daily as needed for diarrhea or loose stools. 12/06/15  Yes Susanne Borders, NP  docusate sodium (COLACE) 100 MG capsule Take 1 capsule (100 mg total) by mouth 2 (two) times daily. 12/01/14  Yes Cleon Gustin, MD  HYDROmorphone (DILAUDID) 4 MG tablet 4 mg every 2-4 hours prn pain 12/26/15  Yes  Wyatt Portela, MD  KLOR-CON M20 20 MEQ tablet TAKE ONE TABLET BY MOUTH ONCE DAILY 10/08/15  Yes [name], MD  lisinopril (PRINIVIL,ZESTRIL) 40 MG tablet Take 1 tablet (40 mg total) by mouth daily. 09/21/15  Yes [name], MD  LORazepam (ATIVAN) 0.5 MG tablet Take 0.5-1 mg by mouth at bedtime.   Yes Historical Provider, MD  magic mouthwash SOLN Take 5 mLs by mouth 3 (three) times daily as needed for mouth pain. 02/05/16  Yes Wyatt Portela, MD  morphine (MS CONTIN) 30 MG 12 hr tablet Take 1 tablet (30 mg total) by mouth every 8 (eight) hours. 12/26/15  Yes Wyatt Portela, MD  Multiple Vitamin (MULTIVITAMIN) capsule Take 1 capsule by mouth daily.   Yes Historical Provider, MD  omega-3 acid ethyl esters (LOVAZA) 1 g capsule Take 1 g by mouth daily.   Yes Historical Provider, MD  Turmeric 500 MG CAPS Take 500 mg by mouth daily.    Yes Historical Provider, MD  ULORIC 40 MG tablet TAKE ONE TABLET BY MOUTH ONCE DAILY 01/08/16  Yes Gay Filler Copland, MD  ondansetron (ZOFRAN) 4 MG tablet TAKE ONE TABLET BY MOUTH THREE TIMES DAILY Patient not taking: Reported on 02/08/2016 10/21/15   Tyler Pita, MD   Allergies  Allergen Reactions  . Lipitor [Atorvastatin] Other (See Comments)    Myopathy...severe, with  Myoglobinuria, wheelchair bound for a few days  . Penicillins Anaphylaxis    As a child Has patient had a PCN reaction causing immediate  rash, facial/tongue/throat swelling, SOB or lightheadedness with hypotension: yes Has patient had a PCN reaction causing severe rash involving mucus membranes or skin necrosis: yes Has patient had a PCN reaction that required hospitalization: yes Has patient had a PCN reaction occurring within the last 10 years: no If all of the above answers are "NO", then may proceed with Cephalosporin use.   . Prednisone Anxiety and Other (See Comments)    Extremely high blood pressure after taking medication for first time.   Review of Systems  Constitutional: Positive for activity change, appetite change, fatigue and unexpected weight change.  Cardiovascular: Positive for leg swelling.  Gastrointestinal: Positive for constipation and nausea.  Musculoskeletal: Positive for back pain, gait problem and neck stiffness.  Neurological: Positive for weakness.  Psychiatric/Behavioral: Positive for sleep disturbance.  All other systems reviewed and are negative.   Physical Exam  General: Alert, awake, in no acute distress. Resting with eyes closed but responds appropriately to questions.  Chronically ill appearing. HEENT: No bruits, no goiter, no JVD Heart: Regular rate and rhythm. No murmur appreciated. Lungs: Fair air movement, clear Abdomen: Soft, globally tender, nondistended, positive bowel sounds.  Ext: +LE edema Skin: Warm and dry, pallor Neuro: Grossly intact, nonfocal.   Vital Signs: BP 122/73 (BP Location: Left Arm)   Pulse 78   Temp 98.9 F (37.2 C) (Oral)   Resp 16   Ht 5' 8" (1.727 m)   Wt 73 kg (161 lb)   SpO2 98%   BMI 24.48 kg/m  Pain Assessment: 0-10   Pain Score: 4    SpO2: SpO2: 98 % O2 Device:SpO2: 98 % O2 Flow Rate: .   IO: Intake/output summary:  Intake/Output Summary (Last 24 hours) at 02/10/16 0919 Last data filed at 02/10/16 0600  Gross per 24 hour  Intake             2370 ml  Output  1900 ml  Net              470 ml    LBM: Last BM Date:  02/08/16 Baseline Weight: Weight: 73 kg (161 lb) Most recent weight: Weight: 73 kg (161 lb)     Palliative Assessment/Data:   Flowsheet Rows   Flowsheet Row Most Recent Value  Intake Tab  Referral Department  Hospitalist  Unit at Time of Referral  Med/Surg Unit  Palliative Care Primary Diagnosis  Cancer  Date Notified  02/09/16  Palliative Care Type  New Palliative care  Reason for referral  Clarify Goals of Care  Date of Admission  02/08/16  Date first seen by Palliative Care  02/09/16  # of days Palliative referral response time  0 Day(s)  # of days IP prior to Palliative referral  1  Clinical Assessment  Palliative Performance Scale Score  40%  Pain Max last 24 hours  8  Pain Min Last 24 hours  0  Psychosocial & Spiritual Assessment  Palliative Care Outcomes  Patient/Family meeting held?  Yes  Who was at the meeting?  Patient, wife, 2 sons, dtr in law, family friend  [place] goals of care      Time In: 1645 Time Out: 1705 Time Total: 80 Greater than 50%  of this time was spent counseling and coordinating care related to the above assessment and plan.  Signed by: Micheline Rough, MD   Please contact Palliative Medicine Team phone at [phone] for questions and concerns.  For individual provider: See Shea Evans

## 2016-02-10 NOTE — Progress Notes (Signed)
PROGRESS NOTE    Derek [name]  ER:[id number] DOB: 29-May-1950 DOA: 02/08/2016 PCP: Mackie Pai, PA-C    Brief Narrative:  65 year old male with metastatic renal cell carcinoma, hypertension, CAD, recent L1 pathological compression fracture with acute back pain presented to ED with rectal bleeding. Patient has been having intractable lower back pain due to L1 compression fracture and had been taking Dilaudid and long-acting morphine, mostly bedbound in the last 1 week. Family had been trying to schedule outpatient kyphoplasty with Dr. Estanislado Pandy. This morning, patient woke up around 4:30am with bright red blood in the stool, subsequently had 2 more episodes. Per patient's son, he had 2 syncopal episodes, has been feeling dizzy and lightheaded since then. Patient is on aspirin 81 mg daily, not on any anticoagulation or NSAIDS. Patient denies any abdominal pain nausea or vomiting. Per family patient has been nonambulatory in the last 1 week due to acute back pain from newly diagnosed L1 pathological compression fracture. 10 days ago he was ambulating with a cane and had gone on a cruise.  Assessment & Plan:   Principal Problem:   Retroperitoneal air Active Problems:   HTN (hypertension)   Prediabetes   S/P CABG x 4   Metastatic renal cell carcinoma (HCC)   Normocytic anemia   Hyperlipidemia   L1 Metastasis   Dehydration   Acute lower GI bleeding   Acute GI bleeding   Coronary artery disease   Unintentional weight loss   Protein-calorie malnutrition ([facility])  Principal Problem:   Acute lower GI bleeding - Hemoglobin currently 7.6 - had discussed with GI. Hold off on endoscopic work up given below issues - Will repeat CBC in AM  Active Problems:  Hypotension with underlying history of HTN (hypertension) - BP presently stable - lisinopril, Coreg on hold    Metastatic renal cell carcinoma (HCC) with L1 metastatic disease, compression fracture, intractable back pain - Patient is s/p  nephrectomy in 2016 and follows Dr. Alen Blew. Outpatient records reviewed. Patient is currently undergoing chemo. Given below findings, will hold chemo for the time being - Hypoplasty was initially planned as outpatient - Most recent MRI reportedly with evidence of colonic fistula formation. 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 - Appreciate input by General Surgery, Neurosurgery, ID, and IR. This AM, discussed case with surgery and neurosurgery. Plan to continue vancomycin. Cefepime changed to meropenem per ID recommendations    Hyponatremia with Dehydration - Patient is continued on IVF - Sodium improved to 130 today - Repeat bmet in AM  Bladder outlet obstruction -One liter of urine noted on bladder scan -Indwelling foley cath placed -Renal function unremarkable -Repeat bmet in AM  DVT prophylaxis: SCD's Code Status: Full Family Communication: Pt in room, Family at bedside Disposition Plan: Uncertain at this time  Consultants:   Neurosurgery  ID  IR  General Surgery  Palliative Care  Procedures:     Antimicrobials: Anti-infectives    Start     Dose/Rate Route Frequency Ordered Stop   02/10/16 1130  meropenem (MERREM) 1 g in sodium chloride 0.9 % 100 mL IVPB     1 g 200 mL/hr over 30 Minutes Intravenous Every 8 hours 02/10/16 1107     02/09/16 1830  vancomycin (VANCOCIN) IVPB 1000 mg/200 mL premix     1,000 mg 200 mL/hr over 60 Minutes Intravenous Every 12 hours 02/09/16 1759     02/09/16 1800  ceFEPIme (MAXIPIME) 2 g in dextrose 5 %  50 mL IVPB  Status:  Discontinued     2 g 100 mL/hr over 30 Minutes Intravenous Every 8 hours 02/09/16 1757 02/10/16 1050      Subjective: Remains lethargic receiving analgesics  Objective: Vitals:   02/09/16 0638 02/09/16 1338 02/09/16 2138 02/10/16 0556  BP:  129/64 129/66 122/73  Pulse:  78 78 78  Resp:  19 18 16   Temp:  99.7 F (37.6 C) 98.3 F  (36.8 C) 98.9 F (37.2 C)  TempSrc:  Axillary Oral Oral  SpO2: 99% 97% 98% 98%  Weight:      Height:        Intake/Output Summary (Last 24 hours) at 02/10/16 1319 Last data filed at 02/10/16 0600  Gross per 24 hour  Intake             2250 ml  Output             1900 ml  Net              350 ml   Filed Weights   02/08/16 0640  Weight: 73 kg (161 lb)    Examination:  General exam: remains lethargic, intermittently conversant, in nad Respiratory system: normal chest rise, no audible wheezing Cardiovascular system: regular rate, s1, s2 Gastrointestinal system: soft, nondistended, pos BS Central nervous system: CN2-12 grossly intact, strength intact Extremities: Perfused, no clubbing Skin: normal skin turgor, no notable skin lesions seen Psychiatry: mood normal// no visual hallucinations  Data Reviewed: I have personally reviewed following labs and imaging studies  CBC:  Recent Labs Lab 02/04/16 1116 02/08/16 0716  02/08/16 1640 02/08/16 2232 02/09/16 0514 02/09/16 1035 02/10/16 0533  WBC 7.2 9.4  --   --   --  10.1  --  8.6  NEUTROABS 6.3 8.5*  --   --   --   --   --   --   HGB 11.3* 8.6*  < > 8.6* 8.2* 8.1* 8.1* 7.6*  HCT 34.4* 24.6*  < > 24.4* 23.2* 23.1* 23.0* 22.1*  MCV 96.4 92.5  --   --   --  92.8  --  92.9  PLT 280 277  --   --   --  291  --  311  < > = values in this interval not displayed. Basic Metabolic Panel:  Recent Labs Lab 02/04/16 1116 02/08/16 0716 02/09/16 0514 02/10/16 0533  NA 128* 127* 128* 130*  K 5.1 4.4 4.5 3.9  CL  --  91* 95* 97*  CO2 27 26 27 26   GLUCOSE 119 135* 111* 112*  BUN 16.0 25* 18 15  CREATININE 0.9 0.92 0.77 0.90  CALCIUM 9.8 8.0* 8.0* 7.9*   GFR: Estimated Creatinine Clearance: 77.1 mL/min (by C-G formula based on SCr of 0.9 mg/dL). Liver Function Tests:  Recent Labs Lab 02/04/16 1116 02/08/16 0716  AST 17 19  ALT 13 14*  ALKPHOS 88 75  BILITOT 0.65 0.7  PROT 7.4 5.9*  ALBUMIN 2.5* 2.3*   No results  for input(s): LIPASE, AMYLASE in the last 168 hours. No results for input(s): AMMONIA in the last 168 hours. Coagulation Profile: No results for input(s): INR, PROTIME in the last 168 hours. Cardiac Enzymes: No results for input(s): CKTOTAL, CKMB, CKMBINDEX, TROPONINI in the last 168 hours. BNP (last 3 results) No results for input(s): PROBNP in the last 8760 hours. HbA1C: No results for input(s): HGBA1C in the last 72 hours. CBG: No results for input(s): GLUCAP in the last 168 hours.  Lipid Profile: No results for input(s): CHOL, HDL, LDLCALC, TRIG, CHOLHDL, LDLDIRECT in the last 72 hours. Thyroid Function Tests: No results for input(s): TSH, T4TOTAL, FREET4, T3FREE, THYROIDAB in the last 72 hours. Anemia Panel: No results for input(s): VITAMINB12, FOLATE, FERRITIN, TIBC, IRON, RETICCTPCT in the last 72 hours. Sepsis Labs:  Recent Labs Lab 02/08/16 0725  LATICACIDVEN 1.51    Recent Results (from the past 240 hour(s))  Urine culture     Status: Abnormal (Preliminary result)   Collection Time: 02/08/16  7:29 PM  Result Value Ref Range Status   Specimen Description URINE, CLEAN CATCH  Final   Special Requests NONE  Final   Culture (A)  Final    60,000 COLONIES/mL STAPHYLOCOCCUS SPECIES (COAGULASE NEGATIVE)   Report Status PENDING  Incomplete     Radiology Studies: Ct Abdomen Pelvis W Contrast  Addendum Date: 02/08/2016   ADDENDUM REPORT: 02/08/2016 14:44 ADDENDUM: These results will be called to the ordering clinician or representative by the Radiologist Assistant, and communication documented in the PACS or zVision Dashboard. Electronically Signed   By: Marijo Conception, M.D.   On: 02/08/2016 14:44   Result Date: 02/08/2016 CLINICAL DATA:  History of metastatic renal cell carcinoma. Rectal bleeding. EXAM: CT ABDOMEN AND PELVIS WITH CONTRAST TECHNIQUE: Multidetector CT imaging of the abdomen and pelvis was performed using the standard protocol following bolus administration of  intravenous contrast. CONTRAST:  149mL ISOVUE-300 IOPAMIDOL (ISOVUE-300) INJECTION 61% COMPARISON:  CT scan of November 16, 2015.  MRI of October 17, 2014. FINDINGS: Lower chest: Minimal bilateral pleural effusions are noted with adjacent subsegmental atelectasis. Hepatobiliary: Minimal cholelithiasis is noted. Enhancing 17 mm rounded abnormality is seen superiorly in right hepatic lobe concerning for possible metastatic lesion. This appears to be enlarged compared to prior MRI. Status post surgical resection of lesions seen peripherally in right hepatic lobe on prior MRI. Pancreas: Unremarkable. No pancreatic ductal dilatation or surrounding inflammatory changes. Spleen: Normal in size without focal abnormality. Adrenals/Urinary Tract: Adrenal glands are unremarkable. 6.9 cm simple cyst is seen arising from upper pole of left kidney. No hydronephrosis or renal obstruction is noted. Urinary bladder appears normal. Status post right nephrectomy. Stomach/Bowel: There is a large area of inflammation and soft tissue gas which extends from the hepatic flexure medially and into the L1 vertebral body, which demonstrates severe pathologic fracture. This gas appears to communicate with the lumen of the hepatic flexure. This abnormality appears to extend into the inferior portion of right hepatic lobe. There is no evidence of bowel obstruction. Vascular/Lymphatic: Aortic atherosclerosis. No enlarged abdominal or pelvic lymph nodes. Reproductive: Stable mild prostatic enlargement is noted. Other: No hernia is noted. Musculoskeletal: As noted previously, severe wedge compression deformity of L1 vertebral body is noted secondary to pathologic fracture, with large amount of soft tissue gas within the right-sided the vertebral body and extending in the perispinal region consistent with infection. Lytic destruction of L2 vertebral body is noted concerning for metastatic disease. IMPRESSION: Enhancing 17 mm rounded abnormality noted  superiorly in right hepatic lobe concerning for metastatic lesion. This appears to be enlarged compared to prior studies. MRI is recommended for further evaluation. Lytic destruction of L2 vertebral body consistent with metastatic lesion. Large ill-defined area of soft tissue inflammation, fluid and gas is seen extending from hepatic flexure through right perispinal tissues into L1 vertebral body. L1 vertebral body has undergone severe compression deformity secondary to pathologic fracture. This abnormality does appear to extend into the medial and inferior portion of  right hepatic lobe. 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 Electronically Signed: By: Marijo Conception, M.D. On: 02/08/2016 14:40    Scheduled Meds: . docusate sodium  100 mg Oral BID  . febuxostat  40 mg Oral Daily  . LORazepam  0.5-1 mg Oral QHS  . meropenem (MERREM) IV  1 g Intravenous Q8H  . pantoprazole (PROTONIX) IV  40 mg Intravenous Q12H  . vancomycin  1,000 mg Intravenous Q12H   Continuous Infusions: . sodium chloride 75 mL/hr at 02/10/16 0159     LOS: 2 days   Derek [name], Orpah Melter, MD [facility] Pager [phone]  If 7PM-7AM, please contact night-coverage [email or web address] Password [email or web address] 02/10/2016, 1:19 PM

## 2016-02-10 NOTE — Progress Notes (Signed)
Pt was awake and lying in bed when [facility] arrived. His wife, niece, and brother were bedside. Pt's wife said they are praying and looking for a Christmas miracle. We had prayer bedside of pt. Family was very appreciative of visit and prayer. Please page if additional support is needed. Chaplain Ernest Haber, M.Div.   02/10/16 1500  Clinical Encounter Type  Visited With Patient and family together

## 2016-02-10 NOTE — Consult Note (Addendum)
[facility] [place]    Date of Admission:  02/08/2016           Day 1 vancomycin        Day 1 cefepime       Reason for Consult: Possible retroperitoneal abscess and L1 vertebral infection   Referring Physician: Dr. Kalman Jewels  Principal Problem:   Retroperitoneal air Active Problems:   Metastatic renal cell carcinoma (HCC)   L1 Metastasis   HTN (hypertension)   Prediabetes   S/P CABG x 4   Normocytic anemia   Hyperlipidemia   Dehydration   Acute lower GI bleeding   Acute GI bleeding   Coronary artery disease   Unintentional weight loss   Protein-calorie malnutrition ([place])   . ceFEPime (MAXIPIME) IV  2 g Intravenous Q8H  . docusate sodium  100 mg Oral BID  . febuxostat  40 mg Oral Daily  . LORazepam  0.5-1 mg Oral QHS  . pantoprazole (PROTONIX) IV  40 mg Intravenous Q12H  . vancomycin  1,000 mg Intravenous Q12H    Recommendations: 1. Continue vancomycin 2. Change cefepime to meropenem 3. Agree with follow-up, contrasted CT scan in several days   Assessment: [name] had the incidental finding of right retroperitoneal soft tissue inflammation with air extending from the hepatic flexure of the colon into the L1 vertebral body. Although he does not have fever or other classic signs of infection it is possible that this is an abscess related to a colonic leak. I will continue vancomycin and change cefepime to meropenem (he is allergic to penicillin) for broader coverage of gut bacteria. I discussed the situation with Dr. Zella Richer and agree with follow-up CT scan in several days.    HPI: Derek [name] is a 65 y.o. male with history of renal cell carcinoma. He underwent right nephrectomy and lymph node dissection as well as partial right hepatectomy and September of last year. He developed recurrence of tumor locally with presumed spread to L1. He has undergone radiation and chemotherapy. He has chronic back pain and about one week ago it became  much more severe. He underwent lumbar MRI on 02/04/2016 which showed a severe compression deformity of L1 that was presumed to be a pathologic fracture. He was referred to interventional radiology for kyphoplasty. He has had problems with intermittent constipation due to narcotics. He developed hematochezia having a bowel movement leading to admission 2 days ago. A CT scan showed a "Large ill-defined area of soft tissue inflammation, fluid and gas is seen extending from hepatic flexure through right perispinal tissues into L1 vertebral body."   Review of Systems: Review of Systems  Constitutional: Positive for malaise/fatigue and weight loss. Negative for chills, diaphoresis and fever.  HENT: Negative for sore throat.   Respiratory: Negative for cough, sputum production and shortness of breath.   Cardiovascular: Negative for chest pain.  Gastrointestinal: Positive for constipation, diarrhea and heartburn. Negative for abdominal pain, nausea and vomiting.  Genitourinary: Negative for dysuria and frequency.  Musculoskeletal: Positive for back pain. Negative for joint pain and myalgias.  Skin: Negative for rash.  Neurological: Negative for dizziness and headaches.  Psychiatric/Behavioral: Negative for depression and substance abuse. The patient is not nervous/anxious.     Past Medical History:  Diagnosis Date  . Arthritis   . Bone cancer ([place])    rcc with L1 pathologic compression fracture  . Cancer [facility])    rcc with L1 pathologic compression fx.  Marland Kitchen  Gout   . Hypertension   . NSTEMI (non-ST elevated myocardial infarction) ([place])   . Renal cell carcinoma ([place]) 11/28/2014  . Renal cell carcinoma ([place])   . Shingles 7/15    Social History  Substance Use Topics  . Smoking status: Never Smoker  . Smokeless tobacco: Never Used  . Alcohol use Yes     Comment: 1-2 drinks/week    Family History  Problem Relation Age of Onset  . Lung cancer Mother   . Lung cancer Father   . Bone cancer  Maternal Grandfather    Allergies  Allergen Reactions  . Lipitor [Atorvastatin] Other (See Comments)    Myopathy...severe, with  Myoglobinuria, wheelchair bound for a few days  . Penicillins Anaphylaxis    As a child Has patient had a PCN reaction causing immediate rash, facial/tongue/throat swelling, SOB or lightheadedness with hypotension: yes Has patient had a PCN reaction causing severe rash involving mucus membranes or skin necrosis: yes Has patient had a PCN reaction that required hospitalization: yes Has patient had a PCN reaction occurring within the last 10 years: no If all of the above answers are "NO", then may proceed with Cephalosporin use.   . Prednisone Anxiety and Other (See Comments)    Extremely high blood pressure after taking medication for first time.    OBJECTIVE: Blood pressure 122/73, pulse 78, temperature 98.9 F (37.2 C), temperature source Oral, resp. rate 16, height 5\' 8"  (1.727 m), weight 161 lb (73 kg), SpO2 98 %.  Physical Exam  Constitutional: He is oriented to person, place, and time.  He is drowsy. He is pale. His family is present and answers most of my questions.  HENT:  Mouth/Throat: No oropharyngeal exudate.  Eyes: Conjunctivae are normal.  Cardiovascular: Normal rate and regular rhythm.   No murmur heard. Pulmonary/Chest: Effort normal and breath sounds normal. He has no wheezes. He has no rales.  Abdominal: Soft. There is no tenderness.  Neurological: He is oriented to person, place, and time.  Resting tremor of his right hand.  Skin: No rash noted.  Psychiatric: Affect normal.    Lab Results Lab Results  Component Value Date   WBC 8.6 02/10/2016   HGB 7.6 (L) 02/10/2016   HCT 22.1 (L) 02/10/2016   MCV 92.9 02/10/2016   PLT 311 02/10/2016    Lab Results  Component Value Date   CREATININE 0.90 02/10/2016   BUN 15 02/10/2016   NA 130 (L) 02/10/2016   K 3.9 02/10/2016   CL 97 (L) 02/10/2016   CO2 26 02/10/2016    Lab Results   Component Value Date   ALT 14 (L) 02/08/2016   AST 19 02/08/2016   ALKPHOS 75 02/08/2016   BILITOT 0.7 02/08/2016    CT abdomen and pelvis with contrast 02/08/2016  IMPRESSION: Enhancing 17 mm rounded abnormality noted superiorly in right hepatic lobe concerning for metastatic lesion. This appears to be enlarged compared to prior studies. MRI is recommended for further evaluation.  Lytic destruction of L2 vertebral body consistent with metastatic lesion.  Large ill-defined area of soft tissue inflammation, fluid and gas is seen extending from hepatic flexure through right perispinal tissues into L1 vertebral body. L1 vertebral body has undergone severe compression deformity secondary to pathologic fracture. This abnormality does appear to extend into the medial and inferior portion of right hepatic lobe. 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  Electronically Signed: By:  Marijo Conception, M.D. On: 02/08/2016 14:40  Microbiology: No results found for this or any previous visit (from the past 240 hour(s)).  Michel Bickers, MD [place] [facility] [phone] pager   [phone] cell 02/10/2016, 10:38 AM

## 2016-02-10 NOTE — Progress Notes (Signed)
Patient ID: Derek [name], male   DOB: 25-Mar-1950, 65 y.o.   MRN: [id number] Subjective:  The patient is alert and pleasant. He feels "better". His wife and another son are at the bedside.  Objective: Vital signs in last 24 hours: Temp:  [98.3 F (36.8 C)-99.7 F (37.6 C)] 98.9 F (37.2 C) (12/10 0556) Pulse Rate:  [78] 78 (12/10 0556) Resp:  [16-19] 16 (12/10 0556) BP: (122-129)/(64-73) 122/73 (12/10 0556) SpO2:  [97 %-98 %] 98 % (12/10 0556)  Intake/Output from previous day: 12/09 0701 - 12/10 0700 In: 2370 [P.O.:120; I.V.:1950; IV Piggyback:300] Out: 1900 [Urine:1900] Intake/Output this shift: No intake/output data recorded.  Physical exam patient is alert and pleasant. His strength is grossly normal his bilateral gastrocnemius and dorsiflexors. His sensation is normal in his lower extremities.  Lab Results:  Recent Labs  02/09/16 0514 02/09/16 1035 02/10/16 0533  WBC 10.1  --  8.6  HGB 8.1* 8.1* 7.6*  HCT 23.1* 23.0* 22.1*  PLT 291  --  311   BMET  Recent Labs  02/09/16 0514 02/10/16 0533  NA 128* 130*  K 4.5 3.9  CL 95* 97*  CO2 27 26  GLUCOSE 111* 112*  BUN 18 15  CREATININE 0.77 0.90  CALCIUM 8.0* 7.9*    Studies/Results: Ct Abdomen Pelvis W Contrast  Addendum Date: 02/08/2016   ADDENDUM REPORT: 02/08/2016 14:44 ADDENDUM: These results will be called to the ordering clinician or representative by the Radiologist Assistant, and communication documented in the PACS or zVision Dashboard. Electronically Signed   By: Marijo Conception, M.D.   On: 02/08/2016 14:44   Result Date: 02/08/2016 CLINICAL DATA:  History of metastatic renal cell carcinoma. Rectal bleeding. EXAM: CT ABDOMEN AND PELVIS WITH CONTRAST TECHNIQUE: Multidetector CT imaging of the abdomen and pelvis was performed using the standard protocol following bolus administration of intravenous contrast. CONTRAST:  155mL ISOVUE-300 IOPAMIDOL (ISOVUE-300) INJECTION 61% COMPARISON:  CT scan of November 16, 2015.  MRI of October 17, 2014. FINDINGS: Lower chest: Minimal bilateral pleural effusions are noted with adjacent subsegmental atelectasis. Hepatobiliary: Minimal cholelithiasis is noted. Enhancing 17 mm rounded abnormality is seen superiorly in right hepatic lobe concerning for possible metastatic lesion. This appears to be enlarged compared to prior MRI. Status post surgical resection of lesions seen peripherally in right hepatic lobe on prior MRI. Pancreas: Unremarkable. No pancreatic ductal dilatation or surrounding inflammatory changes. Spleen: Normal in size without focal abnormality. Adrenals/Urinary Tract: Adrenal glands are unremarkable. 6.9 cm simple cyst is seen arising from upper pole of left kidney. No hydronephrosis or renal obstruction is noted. Urinary bladder appears normal. Status post right nephrectomy. Stomach/Bowel: There is a large area of inflammation and soft tissue gas which extends from the hepatic flexure medially and into the L1 vertebral body, which demonstrates severe pathologic fracture. This gas appears to communicate with the lumen of the hepatic flexure. This abnormality appears to extend into the inferior portion of right hepatic lobe. There is no evidence of bowel obstruction. Vascular/Lymphatic: Aortic atherosclerosis. No enlarged abdominal or pelvic lymph nodes. Reproductive: Stable mild prostatic enlargement is noted. Other: No hernia is noted. Musculoskeletal: As noted previously, severe wedge compression deformity of L1 vertebral body is noted secondary to pathologic fracture, with large amount of soft tissue gas within the right-sided the vertebral body and extending in the perispinal region consistent with infection. Lytic destruction of L2 vertebral body is noted concerning for metastatic disease. IMPRESSION: Enhancing 17 mm rounded abnormality noted superiorly in right hepatic  lobe concerning for metastatic lesion. This appears to be enlarged compared to prior studies.  MRI is recommended for further evaluation. Lytic destruction of L2 vertebral body consistent with metastatic lesion. Large ill-defined area of soft tissue inflammation, fluid and gas is seen extending from hepatic flexure through right perispinal tissues into L1 vertebral body. L1 vertebral body has undergone severe compression deformity secondary to pathologic fracture. This abnormality does appear to extend into the medial and inferior portion of right hepatic lobe. 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 Electronically Signed: By: Marijo Conception, M.D. On: 02/08/2016 14:40    Assessment/Plan: L1 pathologic fracture, retroperitoneal abscess, lumbago: I again discussed the situation with the patient and now his wife and another son. I have told them that the possibilities include a necrotic tumor, a chronic infection from his original surgery, or any new infection from another source such as the colon. I think the latter is the most likely. I have explained that from a neurosurgical point of view I could decompress and stabilize his spine. However I would not want to do this if he has an active source of infection, particularly if it's coming from his colon.  The plan is to continue antibiotics and repeat his CAT scan on Tuesday with oral contrast. We also discussed the possibility of a transfer to an academic institution but he is not presently interested. I have answered all their questions.    LOS: 2 days     Elecia Serafin D 02/10/2016, 11:16 AM

## 2016-02-10 NOTE — Progress Notes (Signed)
Feels about the same.  Wife and son in room.  Remains afebrile with normal WBC  Imp:  RLQ inflammatory process-infectious vs tumor necrosis  Rec:  Continue IV abxs.  Recheck CT of abdomen and pelvis with oral contrast 02/12/16.

## 2016-02-11 DIAGNOSIS — R188 Other ascites: Secondary | ICD-10-CM

## 2016-02-11 DIAGNOSIS — C7931 Secondary malignant neoplasm of brain: Secondary | ICD-10-CM

## 2016-02-11 DIAGNOSIS — K6819 Other retroperitoneal abscess: Secondary | ICD-10-CM

## 2016-02-11 DIAGNOSIS — C649 Malignant neoplasm of unspecified kidney, except renal pelvis: Secondary | ICD-10-CM

## 2016-02-11 DIAGNOSIS — K625 Hemorrhage of anus and rectum: Secondary | ICD-10-CM

## 2016-02-11 DIAGNOSIS — G893 Neoplasm related pain (acute) (chronic): Secondary | ICD-10-CM

## 2016-02-11 LAB — BASIC METABOLIC PANEL
ANION GAP: 6 (ref 5–15)
BUN: 11 mg/dL (ref 6–20)
CALCIUM: 7.6 mg/dL — AB (ref 8.9–10.3)
CO2: 27 mmol/L (ref 22–32)
Chloride: 95 mmol/L — ABNORMAL LOW (ref 101–111)
Creatinine, Ser: 0.73 mg/dL (ref 0.61–1.24)
Glucose, Bld: 112 mg/dL — ABNORMAL HIGH (ref 65–99)
Potassium: 3.2 mmol/L — ABNORMAL LOW (ref 3.5–5.1)
Sodium: 128 mmol/L — ABNORMAL LOW (ref 135–145)

## 2016-02-11 LAB — CBC
HCT: 20.7 % — ABNORMAL LOW (ref 39.0–52.0)
HEMOGLOBIN: 7.3 g/dL — AB (ref 13.0–17.0)
MCH: 31.5 pg (ref 26.0–34.0)
MCHC: 35.3 g/dL (ref 30.0–36.0)
MCV: 89.2 fL (ref 78.0–100.0)
Platelets: 309 10*3/uL (ref 150–400)
RBC: 2.32 MIL/uL — AB (ref 4.22–5.81)
RDW: 14.2 % (ref 11.5–15.5)
WBC: 7.8 10*3/uL (ref 4.0–10.5)

## 2016-02-11 LAB — URINE CULTURE

## 2016-02-11 MED ORDER — HYDROMORPHONE 1 MG/ML IV SOLN
INTRAVENOUS | Status: DC
  Administered 2016-02-11: 20:00:00 via INTRAVENOUS
  Administered 2016-02-12: 3.66 mg via INTRAVENOUS
  Administered 2016-02-12: 1.55 mg via INTRAVENOUS
  Administered 2016-02-12: 5.47 mg via INTRAVENOUS
  Administered 2016-02-12: 2.95 mg via INTRAVENOUS
  Administered 2016-02-12: 3.62 mg via INTRAVENOUS
  Administered 2016-02-12: 2.94 mg via INTRAVENOUS
  Administered 2016-02-13: 2.02 mg via INTRAVENOUS
  Administered 2016-02-13: 3.43 mg via INTRAVENOUS
  Administered 2016-02-13: 2.92 mg via INTRAVENOUS
  Administered 2016-02-13: 2.37 mg via INTRAVENOUS
  Administered 2016-02-13: 1.03 mg via INTRAVENOUS
  Administered 2016-02-13: 1.71 mg via INTRAVENOUS
  Administered 2016-02-13: 10:00:00 via INTRAVENOUS
  Administered 2016-02-14: 2.3 mg via INTRAVENOUS
  Administered 2016-02-14: 1.32 mg via INTRAVENOUS
  Administered 2016-02-14: 1.2 mg via INTRAVENOUS
  Administered 2016-02-14: 3.1 mg via INTRAVENOUS
  Filled 2016-02-11 (×2): qty 25

## 2016-02-11 MED ORDER — NALOXONE HCL 0.4 MG/ML IJ SOLN
0.4000 mg | INTRAMUSCULAR | Status: DC | PRN
Start: 2016-02-11 — End: 2016-02-14

## 2016-02-11 MED ORDER — HYDROMORPHONE HCL 1 MG/ML IJ SOLN
0.5000 mg | INTRAMUSCULAR | Status: DC | PRN
  Administered 2016-02-11: 0.5 mg via INTRAVENOUS
  Filled 2016-02-11: qty 0.5

## 2016-02-11 MED ORDER — ENSURE ENLIVE PO LIQD
237.0000 mL | Freq: Two times a day (BID) | ORAL | Status: DC
  Administered 2016-02-11 – 2016-02-13 (×3): 237 mL via ORAL

## 2016-02-11 MED ORDER — HYDROMORPHONE HCL 1 MG/ML IJ SOLN
1.0000 mg | INTRAMUSCULAR | Status: AC
  Administered 2016-02-11: 1 mg via INTRAVENOUS
  Filled 2016-02-11: qty 1

## 2016-02-11 MED ORDER — HYDROMORPHONE HCL 1 MG/ML IJ SOLN
1.0000 mg | INTRAMUSCULAR | Status: DC | PRN
  Administered 2016-02-11 (×2): 1 mg via INTRAVENOUS
  Filled 2016-02-11 (×2): qty 1

## 2016-02-11 MED ORDER — SODIUM CHLORIDE 0.9% FLUSH: 9.0000 mL | INTRAVENOUS | Status: DC | PRN

## 2016-02-11 MED ORDER — DIPHENHYDRAMINE HCL 12.5 MG/5ML PO ELIX: 12.5000 mg | ORAL_SOLUTION | Freq: Four times a day (QID) | ORAL | Status: DC | PRN

## 2016-02-11 MED ORDER — POTASSIUM CHLORIDE CRYS ER 20 MEQ PO TBCR
40.0000 meq | EXTENDED_RELEASE_TABLET | Freq: Two times a day (BID) | ORAL | Status: AC
  Administered 2016-02-11 (×2): 40 meq via ORAL
  Filled 2016-02-11 (×2): qty 2

## 2016-02-11 MED ORDER — MORPHINE SULFATE ER 15 MG PO TBCR
15.0000 mg | EXTENDED_RELEASE_TABLET | Freq: Two times a day (BID) | ORAL | Status: DC
  Administered 2016-02-11: 15 mg via ORAL
  Filled 2016-02-11: qty 1

## 2016-02-11 MED ORDER — DIPHENHYDRAMINE HCL 50 MG/ML IJ SOLN: 12.5000 mg | Freq: Four times a day (QID) | INTRAMUSCULAR | Status: DC | PRN

## 2016-02-11 NOTE — Progress Notes (Signed)
PROGRESS NOTE    Derek [name]  [id number] DOB: 07/07/1950 DOA: 02/08/2016 PCP: Mackie Pai, PA-C    Brief Narrative:  65 year old male with metastatic renal cell carcinoma, hypertension, CAD, recent L1 pathological compression fracture with acute back pain presented to ED with rectal bleeding. Patient has been having intractable lower back pain due to L1 compression fracture and had been taking Dilaudid and long-acting morphine, mostly bedbound in the last 1 week. Family had been trying to schedule outpatient kyphoplasty with Dr. Estanislado Pandy. This morning, patient woke up around 4:30am with bright red blood in the stool, subsequently had 2 more episodes. Per patient's son, he had 2 syncopal episodes, has been feeling dizzy and lightheaded since then. Patient is on aspirin 81 mg daily, not on any anticoagulation or NSAIDS. Patient denies any abdominal pain nausea or vomiting. Per family patient has been nonambulatory in the last 1 week due to acute back pain from newly diagnosed L1 pathological compression fracture. 10 days ago he was ambulating with a cane and had gone on a cruise.  Assessment & Plan:   Principal Problem:   Retroperitoneal air Active Problems:   HTN (hypertension)   Prediabetes   S/P CABG x 4   Metastatic renal cell carcinoma (HCC)   Normocytic anemia   Hyperlipidemia   L1 Metastasis   Dehydration   Acute lower GI bleeding   Acute GI bleeding   Coronary artery disease   Unintentional weight loss   Protein-calorie malnutrition (HCC)  Principal Problem:   Acute lower GI bleeding - Hemoglobin currently 7.3 - GI initially consulted. Presently not candidate for endoscopy secondary to below issues - re-check CBC tomorrow  Active Problems:  Hypotension with underlying history of HTN (hypertension) - BP remains stable - for now, lisinopril, Coreg remain on hold    Metastatic renal cell carcinoma (HCC) with L1 metastatic disease, compression fracture, intractable  back pain - Patient is s/p nephrectomy in 2016 and follows Dr. Alen Blew. Outpatient records reviewed. Patient is currently undergoing chemo. 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 - Appreciate input by General Surgery, Neurosurgery, ID, and IR.  - Patient is continued on broad spectrum abx with plans for follow up CT scan tomorrow. Additional plans will be based on CT findings - have discussed with Palliative Care regarding assistance with pain regimen (see below)    Hyponatremia with Dehydration - Patient is continued on IVF - Sodium slightly lower to 128 this AM - Will recheck bmet in AM  Bladder outlet obstruction -One liter of urine noted on recent bladder scan -For now, will continue with indwelling foley cath given severity of patient's condition -Renal function unremarkable  Chronic pain - patient continues with marked pain despite 2mg  dilaudid q3hrs - For now, will resume MS Contin at 15mg  BID with 1mg  dilaudid q3hrs for breakthrough pain - Will ask Palliative Care for assistance with titration of pain regimen to ensure maximal patient comfort  DVT prophylaxis: SCD's Code Status: Full Family Communication: Pt in room, Family at bedside Disposition Plan: Uncertain at this time  Consultants:   Neurosurgery  ID  IR  General Surgery  Palliative Care  Procedures:     Antimicrobials: Anti-infectives    Start     Dose/Rate Route Frequency Ordered Stop   02/10/16  1130  meropenem (MERREM) 1 g in sodium chloride 0.9 % 100 mL IVPB     1 g 200 mL/hr over 30 Minutes Intravenous Every 8 hours 02/10/16 1107     02/09/16 1830  vancomycin (VANCOCIN) IVPB 1000 mg/200 mL premix     1,000 mg 200 mL/hr over 60 Minutes  Intravenous Every 12 hours 02/09/16 1759     02/09/16 1800  ceFEPIme (MAXIPIME) 2 g in dextrose 5 % 50 mL IVPB  Status:  Discontinued     2 g 100 mL/hr over 30 Minutes Intravenous Every 8 hours 02/09/16 1757 02/10/16 1050      Subjective: Awake, still complaining of continued back pain  Objective: Vitals:   02/10/16 1500 02/10/16 2225 02/11/16 0446 02/11/16 1355  BP: 138/77 119/63 122/70 140/82  Pulse: 81 74 76 75  Resp: 16 16 16 18   Temp: 98.2 F (36.8 C) 99.4 F (37.4 C) 98.1 F (36.7 C) 98.8 F (37.1 C)  TempSrc: Oral Oral Oral Oral  SpO2: 100% 96% 96% 96%  Weight:      Height:        Intake/Output Summary (Last 24 hours) at 02/11/16 1514 Last data filed at 02/11/16 1355  Gross per 24 hour  Intake             2015 ml  Output              650 ml  Net             1365 ml   Filed Weights   02/08/16 0640  Weight: 73 kg (161 lb)    Examination:  General exam: asleep, arousable, somewhat conversant Respiratory system: normal resp effort, no wheezing Cardiovascular system: regular rhythm, s1, s2 on auscultation  Gastrointestinal system: pos bs, nondistended  Central nervous system: no tremors, no seizures Extremities: no cyanosis, no joint deformities Skin: no rashes, increased pallor Psychiatry: affect normal// no auditory hallucinations  Data Reviewed: I have personally reviewed following labs and imaging studies  CBC:  Recent Labs Lab 02/08/16 0716  02/08/16 2232 02/09/16 0514 02/09/16 1035 02/10/16 0533 02/11/16 0444  WBC 9.4  --   --  10.1  --  8.6 7.8  NEUTROABS 8.5*  --   --   --   --   --   --   HGB 8.6*  < > 8.2* 8.1* 8.1* 7.6* 7.3*  HCT 24.6*  < > 23.2* 23.1* 23.0* 22.1* 20.7*  MCV 92.5  --   --  92.8  --  92.9 89.2  PLT 277  --   --  291  --  311 309  < > = values in this interval not displayed. Basic Metabolic Panel:  Recent Labs Lab 02/08/16 0716 02/09/16 0514 02/10/16 0533 02/11/16 0444  NA 127* 128* 130* 128*  K 4.4 4.5 3.9 3.2*   CL 91* 95* 97* 95*  CO2 26 27 26 27   GLUCOSE 135* 111* 112* 112*  BUN 25* 18 15 11   CREATININE 0.92 0.77 0.90 0.73  CALCIUM 8.0* 8.0* 7.9* 7.6*   GFR: Estimated Creatinine Clearance: 86.7 mL/min (by C-G formula based on SCr of 0.73 mg/dL). Liver Function Tests:  Recent Labs Lab 02/08/16 0716  AST 19  ALT 14*  ALKPHOS 75  BILITOT 0.7  PROT 5.9*  ALBUMIN 2.3*   No results for input(s): LIPASE, AMYLASE in the last 168 hours. No results for input(s): AMMONIA in the last 168 hours. Coagulation Profile: No results for input(s): INR, PROTIME  in the last 168 hours. Cardiac Enzymes: No results for input(s): CKTOTAL, CKMB, CKMBINDEX, TROPONINI in the last 168 hours. BNP (last 3 results) No results for input(s): PROBNP in the last 8760 hours. HbA1C: No results for input(s): HGBA1C in the last 72 hours. CBG: No results for input(s): GLUCAP in the last 168 hours. Lipid Profile: No results for input(s): CHOL, HDL, LDLCALC, TRIG, CHOLHDL, LDLDIRECT in the last 72 hours. Thyroid Function Tests: No results for input(s): TSH, T4TOTAL, FREET4, T3FREE, THYROIDAB in the last 72 hours. Anemia Panel: No results for input(s): VITAMINB12, FOLATE, FERRITIN, TIBC, IRON, RETICCTPCT in the last 72 hours. Sepsis Labs:  Recent Labs Lab 02/08/16 0725  LATICACIDVEN 1.51    Recent Results (from the past 240 hour(s))  Urine culture     Status: Abnormal   Collection Time: 02/08/16  7:29 PM  Result Value Ref Range Status   Specimen Description URINE, CLEAN CATCH  Final   Special Requests NONE  Final   Culture 60,000 COLONIES/mL STAPHYLOCOCCUS HAEMOLYTICUS (A)  Final   Report Status 02/11/2016 FINAL  Final   Organism ID, Bacteria STAPHYLOCOCCUS HAEMOLYTICUS (A)  Final      Susceptibility   Staphylococcus haemolyticus - MIC*    CIPROFLOXACIN <=0.5 SENSITIVE Sensitive     GENTAMICIN <=0.5 SENSITIVE Sensitive     NITROFURANTOIN <=16 SENSITIVE Sensitive     OXACILLIN <=0.25 SENSITIVE Sensitive       TETRACYCLINE >=16 RESISTANT Resistant     VANCOMYCIN <=0.5 SENSITIVE Sensitive     TRIMETH/SULFA <=10 SENSITIVE Sensitive     CLINDAMYCIN <=0.25 SENSITIVE Sensitive     RIFAMPIN <=0.5 SENSITIVE Sensitive     Inducible Clindamycin NEGATIVE Sensitive     * 60,000 COLONIES/mL STAPHYLOCOCCUS HAEMOLYTICUS     Radiology Studies: No results found.  Scheduled Meds: . docusate sodium  100 mg Oral BID  . febuxostat  40 mg Oral Daily  . LORazepam  0.5-1 mg Oral QHS  . meropenem (MERREM) IV  1 g Intravenous Q8H  . morphine  15 mg Oral Q12H  . pantoprazole (PROTONIX) IV  40 mg Intravenous Q12H  . potassium chloride  40 mEq Oral BID  . vancomycin  1,000 mg Intravenous Q12H   Continuous Infusions: . sodium chloride 75 mL/hr (02/11/16 1150)     LOS: 3 days   CHIU, Orpah Melter, MD [facility] Pager [phone]  If 7PM-7AM, please contact night-coverage [email or web address] Password [facility] 02/11/2016, 3:14 PM

## 2016-02-11 NOTE — Progress Notes (Signed)
IP PROGRESS NOTE  Subjective:   Events in the last few days noted. 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  Clinically, patient appears to be ill and in severe pain.  He is not reporting any fevers at this time and although response yet reasonable lethargic.   Cabometyx been withheld at this time.  Objective:  Vital signs in last 24 hours: Temp:  [98.1 F (36.7 C)-99.4 F (37.4 C)] 98.1 F (36.7 C) (12/11 0446) Pulse Rate:  [74-81] 76 (12/11 0446) Resp:  [16] 16 (12/11 0446) BP: (119-138)/(63-77) 122/70 (12/11 0446) SpO2:  [96 %-100 %] 96 % (12/11 0446) Weight change:  Last BM Date: 02/08/16  Intake/Output from previous day: 12/10 0701 - 12/11 0700 In: 3115 [P.O.:240; I.V.:1875; IV Piggyback:600] Out: 550 [Urine:550] Ill-appearing gentleman appeared in mild distress. Mouth: Dry mucous membranes with oral ulcers noted. Resp: clear to auscultation bilaterally Cardio: regular rate and rhythm, S1, S2 normal, no murmur, click, rub or gallop GI: soft, non-tender; bowel sounds normal; no masses,  no organomegaly Extremities: extremities normal, atraumatic, no cyanosis or edema    Lab Results:  Recent Labs  02/10/16 0533 02/11/16 0444  WBC 8.6 7.8  HGB 7.6* 7.3*  HCT 22.1* 20.7*  PLT 311 309    BMET  Recent Labs  02/10/16 0533 02/11/16 0444  NA 130* 128*  K 3.9 3.2*  CL 97* 95*  CO2 26 27  GLUCOSE 112* 112*  BUN 15 11  CREATININE 0.90 0.73  CALCIUM 7.9* 7.6*    Studies/Results: No results found.  Medications: I have reviewed the patient's current medications.  Assessment/Plan:  65 year old with the following issues:  1. Metastatic renal cell carcinoma: He is currently on salvage therapy utilizing Cabometyx which has been withheld at this time. His cancer is certainly incurable and proven to be rather difficult to treat.   His  disease has been stabilized on this treatment but certainly his prognosis overall is relatively poor because of the advanced nature of this malignancy.  I agree with holding treatment at this time given the acute illness he is experiencing.  2. 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 A template from infectious disease, neurosurgery and Gen. surgery. It is a certainly complex situation and I defer the management of this to the services.  I explained to the patient and his wife that the likelihood of the outcome is not favorable given the aggressive nature of his malignancy. Fistula is associated with this malignancy carries a poor prognosis in general.  CT scan is scheduled to be repeated in the next 24 hours.  3. Pain: I recommend palliative medicine involvement for pain management at this time. He will likely require continuous drip given the amount of pain he is experiencing.  I addressed the patient and his wife's concerns today the best of my ability. They understand his prognosis is rather poor but would like to continue to be aggressive at this time.   LOS: 3 days   SHADAD,FIRAS 02/11/2016, 10:15 AM

## 2016-02-11 NOTE — Progress Notes (Signed)
Initial Nutrition Assessment  DOCUMENTATION CODES:   Severe malnutrition in context of acute illness/injury  INTERVENTION:  - Will order Ensure Enlive po BID, each supplement provides 350 kcal and 20 grams of protein - RD will continue to monitor for additional nutrition-related needs.  NUTRITION DIAGNOSIS:   Inadequate oral intake related to poor appetite as evidenced by per patient/family report.  GOAL:   Patient will meet greater than or equal to 90% of their needs  MONITOR:   PO intake, Supplement acceptance, Weight trends, Labs, I & O's  REASON FOR ASSESSMENT:   Malnutrition Screening Tool  ASSESSMENT:   65 year old male with metastatic renal cell carcinoma, hypertension, CAD, recent L1 pathological compression fracture with acute back pain presented to ED with rectal bleeding. Patient has been having intractable lower back pain due to L1 compression fracture and had been taking Dilaudid and long-acting morphine, mostly bedbound in the last 1 week. Family had been trying to schedule outpatient kyphoplasty with Dr. Estanislado Pandy. This morning, patient woke up around 4:30am with bright red blood in the stool, subsequently had 2 more episodes. Per patient's son, he had 2 syncopal episodes, has been feeling dizzy and lightheaded since then. Patient is on aspirin 81 mg daily, not on any anticoagulation or NSAIDS. Patient denies any abdominal pain nausea or vomiting.  Pt seen for MST. BMI indicates normal weight. Pt was NPO from time of admission until yesterday at 1148. No intakes documented since diet advancement. Pt was sleeping at time of RD visit and three friends were at bedside who had been present for meals today; they report that pt's wife went home for a break for the afternoon. Friends state that pt has only been eating bites with encouragement at meal times and that for lunch he had a few bites of jello. One friend awoke pt who did not open eyes but shook head to indicate that he  has not had any pain or other discomfort with PO intakes.  Unable to complete physical assessment at this time with respect to pt's comfort. Per chart review, pt has lost 7 lbs (4% body weight) ~2 weeks which is significant for time frame. Pt meets criteria for weight loss based on weight loss and <50% needed intakes for >/= 5 days.  Medications reviewed; 100 mg Colace BID, PRN Zofran, 40 mg IV Protonix BID, 40 mEq oral KCl BID.  Labs reviewed; Na: 128 mmol/L, K: 3.2 mmol/L, Cl: 95 mmol/L, Ca: 7.6 mg/dL.  IVF: NS @ 75 mL/hr.      Diet Order:  Diet regular Room service appropriate? Yes; Fluid consistency: Thin  Skin:  Reviewed, no issues  Last BM:  12/8   Height:   Ht Readings from Last 1 Encounters:  02/08/16 5\' 8"  (1.727 m)    Weight:   Wt Readings from Last 1 Encounters:  02/08/16 161 lb (73 kg)    Ideal Body Weight:  70 kg  BMI:  Body mass index is 24.48 kg/m.  Estimated Nutritional Needs:   Kcal:  [id number] (30-32 kcal/kg)  Protein:  88-102 grams (1.2-1.4 grams/kg)  Fluid:  >/= 2 L/day  EDUCATION NEEDS:   No education needs identified at this time    Jarome Matin, MS, RD, LDN, CNSC Inpatient Clinical Dietitian Pager # [phone] After hours/weekend pager # [phone]

## 2016-02-11 NOTE — Progress Notes (Signed)
[facility] Progress Note     Subjective: C/o mid back pain, slightly improved compared to yesterday. Denies abdominal pain. Denies fever, chills, nausea, or vomiting. Wife at bedside.   Objective: Vital signs in last 24 hours: Temp:  [98.1 F (36.7 C)-99.4 F (37.4 C)] 98.1 F (36.7 C) (12/11 0446) Pulse Rate:  [74-81] 76 (12/11 0446) Resp:  [16] 16 (12/11 0446) BP: (119-138)/(63-77) 122/70 (12/11 0446) SpO2:  [96 %-100 %] 96 % (12/11 0446) Last BM Date: 02/08/16  Intake/Output from previous day: 12/10 0701 - 12/11 0700 In: 3115 [P.O.:240; I.V.:1875; IV Piggyback:600] Out: 550 [Urine:550] Intake/Output this shift: No intake/output data recorded.  PE: Gen:  Alert, ill-appearing, cooperative; in no acute distress  Pulm:  CTABL, unlabored  Abd: Soft, palpation RUQ/epigastrium causes back pain, ND, +BS  Lab Results:   Recent Labs  02/10/16 0533 02/11/16 0444  WBC 8.6 7.8  HGB 7.6* 7.3*  HCT 22.1* 20.7*  PLT 311 309   BMET  Recent Labs  02/10/16 0533 02/11/16 0444  NA 130* 128*  K 3.9 3.2*  CL 97* 95*  CO2 26 27  GLUCOSE 112* 112*  BUN 15 11  CREATININE 0.90 0.73  CALCIUM 7.9* 7.6*   CMP     Component Value Date/Time   NA 128 (L) 02/11/2016 0444   NA 128 (L) 02/04/2016 1116   K 3.2 (L) 02/11/2016 0444   K 5.1 02/04/2016 1116   CL 95 (L) 02/11/2016 0444   CO2 27 02/11/2016 0444   CO2 27 02/04/2016 1116   GLUCOSE 112 (H) 02/11/2016 0444   GLUCOSE 119 02/04/2016 1116   BUN 11 02/11/2016 0444   BUN 16.0 02/04/2016 1116   CREATININE 0.73 02/11/2016 0444   CREATININE 0.9 02/04/2016 1116   CALCIUM 7.6 (L) 02/11/2016 0444   CALCIUM 9.8 02/04/2016 1116   PROT 5.9 (L) 02/08/2016 0716   PROT 7.4 02/04/2016 1116   ALBUMIN 2.3 (L) 02/08/2016 0716   ALBUMIN 2.5 (L) 02/04/2016 1116   AST 19 02/08/2016 0716   AST 17 02/04/2016 1116   ALT 14 (L) 02/08/2016 0716   ALT 13 02/04/2016 1116   ALKPHOS 75 02/08/2016 0716   ALKPHOS 88 02/04/2016  1116   BILITOT 0.7 02/08/2016 0716   BILITOT 0.65 02/04/2016 1116   GFRNONAA >60 02/11/2016 0444   GFRNONAA 58 (L) 12/11/2014 1614   GFRAA >60 02/11/2016 0444   GFRAA 67 12/11/2014 1614   Lipase     Component Value Date/Time   LIPASE 29 09/30/2014 1640   Anti-infectives: Anti-infectives    Start     Dose/Rate Route Frequency Ordered Stop   02/10/16 1130  meropenem (MERREM) 1 g in sodium chloride 0.9 % 100 mL IVPB     1 g 200 mL/hr over 30 Minutes Intravenous Every 8 hours 02/10/16 1107     02/09/16 1830  vancomycin (VANCOCIN) IVPB 1000 mg/200 mL premix     1,000 mg 200 mL/hr over 60 Minutes Intravenous Every 12 hours 02/09/16 1759     02/09/16 1800  ceFEPIme (MAXIPIME) 2 g in dextrose 5 % 50 mL IVPB  Status:  Discontinued     2 g 100 mL/hr over 30 Minutes Intravenous Every 8 hours 02/09/16 1757 02/10/16 1050     Assessment/Plan Complex inflammatory phlegmon process right retroperitoneum in the setting of metastatic renal cell carcinoma  No definite fluid collection appreciated on CT 12/8 -infectious etiology vs tumor necrosis  Repeat CT Abd/Pelvis w/ PO contrast tomorrow 02/12/16  ID: meropenem and vancomycin; ID following   Rectal bleeding - hgb 7.3 L1 pathological compression fracture Hypotension w/ PMH HTN Hyponatremia  Bladder outlet obstruction  Plan: CT Abd/pelvis w/ PO contrast tomorrow AM to better evaluate possible retroperitoneal fluid collection   LOS: 3 days    Jill Alexanders , [facility] 02/11/2016, 10:44 AM Pager: [phone] Consults: [phone] Mon-Fri 7:00 am-4:30 pm Sat-Sun 7:00 am-11:30 am

## 2016-02-12 ENCOUNTER — Inpatient Hospital Stay (HOSPITAL_COMMUNITY): Payer: Medicare Other

## 2016-02-12 ENCOUNTER — Other Ambulatory Visit: Payer: Self-pay | Admitting: Neurosurgery

## 2016-02-12 ENCOUNTER — Encounter (HOSPITAL_COMMUNITY): Payer: Self-pay | Admitting: Radiology

## 2016-02-12 DIAGNOSIS — Z515 Encounter for palliative care: Secondary | ICD-10-CM

## 2016-02-12 DIAGNOSIS — K668 Other specified disorders of peritoneum: Secondary | ICD-10-CM

## 2016-02-12 DIAGNOSIS — L0291 Cutaneous abscess, unspecified: Secondary | ICD-10-CM

## 2016-02-12 DIAGNOSIS — E43 Unspecified severe protein-calorie malnutrition: Secondary | ICD-10-CM | POA: Insufficient documentation

## 2016-02-12 DIAGNOSIS — G893 Neoplasm related pain (acute) (chronic): Secondary | ICD-10-CM

## 2016-02-12 LAB — BASIC METABOLIC PANEL
ANION GAP: 5 (ref 5–15)
BUN: 11 mg/dL (ref 6–20)
CALCIUM: 7.5 mg/dL — AB (ref 8.9–10.3)
CO2: 27 mmol/L (ref 22–32)
Chloride: 96 mmol/L — ABNORMAL LOW (ref 101–111)
Creatinine, Ser: 0.74 mg/dL (ref 0.61–1.24)
Glucose, Bld: 111 mg/dL — ABNORMAL HIGH (ref 65–99)
Potassium: 3.8 mmol/L (ref 3.5–5.1)
SODIUM: 128 mmol/L — AB (ref 135–145)

## 2016-02-12 LAB — CBC
HCT: 22.8 % — ABNORMAL LOW (ref 39.0–52.0)
HEMOGLOBIN: 7.8 g/dL — AB (ref 13.0–17.0)
MCH: 31.3 pg (ref 26.0–34.0)
MCHC: 34.2 g/dL (ref 30.0–36.0)
MCV: 91.6 fL (ref 78.0–100.0)
Platelets: 347 10*3/uL (ref 150–400)
RBC: 2.49 MIL/uL — AB (ref 4.22–5.81)
RDW: 14.2 % (ref 11.5–15.5)
WBC: 7.6 10*3/uL (ref 4.0–10.5)

## 2016-02-12 LAB — VANCOMYCIN, TROUGH: VANCOMYCIN TR: 12 ug/mL — AB (ref 15–20)

## 2016-02-12 LAB — PREALBUMIN

## 2016-02-12 MED ORDER — SODIUM CHLORIDE 0.9 % IJ SOLN
INTRAMUSCULAR | Status: AC
  Filled 2016-02-12: qty 50

## 2016-02-12 MED ORDER — IOPAMIDOL (ISOVUE-300) INJECTION 61%
100.0000 mL | Freq: Once | INTRAVENOUS | Status: AC | PRN
  Administered 2016-02-12: 100 mL via INTRAVENOUS

## 2016-02-12 MED ORDER — IOPAMIDOL (ISOVUE-300) INJECTION 61%
30.0000 mL | Freq: Once | INTRAVENOUS | Status: AC
Start: 2016-02-12 — End: 2016-02-12
  Administered 2016-02-12: 30 mL via ORAL

## 2016-02-12 MED ORDER — IOPAMIDOL (ISOVUE-300) INJECTION 61%
INTRAVENOUS | Status: AC
  Filled 2016-02-12: qty 100

## 2016-02-12 MED ORDER — VANCOMYCIN HCL IN DEXTROSE 1-5 GM/200ML-% IV SOLN
1000.0000 mg | INTRAVENOUS | Status: AC
  Administered 2016-02-12: 1000 mg via INTRAVENOUS
  Filled 2016-02-12: qty 200

## 2016-02-12 MED ORDER — VANCOMYCIN HCL IN DEXTROSE 750-5 MG/150ML-% IV SOLN
750.0000 mg | Freq: Three times a day (TID) | INTRAVENOUS | Status: DC
  Administered 2016-02-13 – 2016-02-14 (×4): 750 mg via INTRAVENOUS
  Filled 2016-02-12 (×6): qty 150

## 2016-02-12 MED ORDER — IOPAMIDOL (ISOVUE-300) INJECTION 61%
INTRAVENOUS | Status: AC
  Filled 2016-02-12: qty 30

## 2016-02-12 NOTE — Progress Notes (Signed)
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  Vancomycin Trough = 12 mcg/ml (Goal 15-20 mcg/ml) on Vancomycin 1gm IV q12h  Scr has improved to 0.74 with CrCl ~ 86 ml/min  Plan: Give Vancomycin 1gm IV x 1 dose now then continue with Vancomycin 750mg  IV q8h Continue meropenem  Leone Haven, PharmD 02/12/16 @ 21:42

## 2016-02-12 NOTE — Care Management Important Message (Signed)
Important Message  Patient Details  Name: Derek [name] MRN: [id number] Date of Birth: 1950/10/31   Medicare Important Message Given:  Yes    Camillo Flaming 02/12/2016, 11:23 AMImportant Message  Patient Details  Name: Derek [name] MRN: [id number] Date of Birth: 1951/01/13   Medicare Important Message Given:  Yes    Camillo Flaming 02/12/2016, 11:23 AM

## 2016-02-12 NOTE — Progress Notes (Signed)
    [facility] for Infectious Disease   Reason for visit: Follow up on possible abscess collection  Interval History: no fever, currently in CT scan  Physical Exam: Constitutional:  Vitals:   02/12/16 1100 02/12/16 1200  BP: (!) 161/94   Pulse: 74   Resp: 16 16  Temp: 98.3 F (36.8 C)     Impression/plan: CT scan report noted, it does suggest contained perforation still.  No apparent pain.   From an ID standpoint, it appears to be an abscess.  Treatment options are IV ertapenem or oral levaquin/flagyl, which I think would be adequate, for 7-10 days.  I have not yet discussed with patient or family.

## 2016-02-12 NOTE — Progress Notes (Signed)
Daily Progress Note   Patient Name: Derek [name]       Date: 02/12/2016 DOB: 11-Jun-1950  Age: 65 y.o. MRN#: [id number] Attending Physician: Donne Hazel, MD Primary Care Physician: Elise Benne Admit Date: 02/08/2016  Reason for Consultation/Follow-up: Establishing goals of care and Pain control  Subjective: Met today with Dr. Jacqualin Combes, his wife, and his son.  They are in much better spirits today and report that they are anxious to see the results of planned CT with contrast.  His main problem today is pain that he currently reports is out of control.  Pain earlier today was 9/10 (7/10 is usually max pain he has) nad has improved a small amount since getting pain medications.  His home regimen consists of MS contin 49m TID and five 436mdoses of PO dilaudid daily.  Discussed options for getting pain under better control and he endorses having PCA in the past with good results.  He would prefer this again while determining his needs for pain management.  Length of Stay: 4  Current Medications: Scheduled Meds:  . docusate sodium  100 mg Oral BID  . febuxostat  40 mg Oral Daily  . feeding supplement (ENSURE ENLIVE)  237 mL Oral BID BM  . HYDROmorphone   Intravenous Q4H  . LORazepam  0.5-1 mg Oral QHS  . meropenem (MERREM) IV  1 g Intravenous Q8H  . pantoprazole (PROTONIX) IV  40 mg Intravenous Q12H  . vancomycin  1,000 mg Intravenous Q12H    Continuous Infusions: . sodium chloride 75 mL/hr (02/11/16 1150)    PRN Meds: acetaminophen **OR** acetaminophen, diphenhydrAMINE **OR** diphenhydrAMINE, diphenoxylate-atropine, magic mouthwash, naloxone **AND** sodium chloride flush, ondansetron **OR** ondansetron (ZOFRAN) IV  Physical Exam      General: Alert, awake, in no acute  distress. Resting with eyes closed but responds appropriately to questions.  Chronically ill appearing. HEENT: No bruits, no goiter, no JVD Heart: Regular rate and rhythm. No murmur appreciated. Lungs: Fair air movement, clear Abdomen: Soft, globally tender, nondistended, positive bowel sounds.  Ext: +LE edema Skin: Warm and dry, pallor Neuro: Grossly intact, nonfocal.     Vital Signs: BP (!) 147/70 (BP Location: Left Arm)   Pulse 75   Temp 98.3 F (36.8 C) (Oral)   Resp 16   Ht 5' 8"  (1.727 m)  Wt 73 kg (161 lb)   SpO2 96%   BMI 24.48 kg/m  SpO2: SpO2: 96 % O2 Device: O2 Device: Not Delivered O2 Flow Rate:    Intake/output summary:  Intake/Output Summary (Last 24 hours) at 02/12/16 0111 Last data filed at 02/11/16 1700  Gross per 24 hour  Intake             1925 ml  Output              425 ml  Net             1500 ml   LBM: Last BM Date: 02/08/16 Baseline Weight: Weight: 73 kg (161 lb) Most recent weight: Weight: 73 kg (161 lb)       Palliative Assessment/Data:    Flowsheet Rows   Flowsheet Row Most Recent Value  Intake Tab  Referral Department  Hospitalist  Unit at Time of Referral  Med/Surg Unit  Palliative Care Primary Diagnosis  Cancer  Date Notified  02/09/16  Palliative Care Type  New Palliative care  Reason for referral  Clarify Goals of Care  Date of Admission  02/08/16  Date first seen by Palliative Care  02/09/16  # of days Palliative referral response time  0 Day(s)  # of days IP prior to Palliative referral  1  Clinical Assessment  Palliative Performance Scale Score  40%  Pain Max last 24 hours  8  Pain Min Last 24 hours  0  Psychosocial & Spiritual Assessment  Palliative Care Outcomes  Patient/Family meeting held?  Yes  Who was at the meeting?  Patient, wife, 2 sons, dtr in Sports coach, family friend  Palliative Care Outcomes  Clarified goals of care      Patient Active Problem List   Diagnosis Date Noted  . Intraabdominal fluid collection     . Coronary artery disease 02/10/2016  . Unintentional weight loss 02/10/2016  . Protein-calorie malnutrition ([place]) 02/10/2016  . Retroperitoneal air 02/10/2016  . Acute lower GI bleeding 02/08/2016  . Acute GI bleeding 02/08/2016  . Diarrhea 10/29/2015  . Dehydration 10/29/2015  . 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 Medication management 09/21/2015  . Hyperlipidemia 12/11/2014  . Normocytic anemia 12/02/2014  . Metastatic renal cell carcinoma ([place]) 11/28/2014  . Gout 02/17/2014  . 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 HTN (hypertension) 01/23/2014  . Obesity 01/23/2014  . Prediabetes 01/23/2014  . Systolic and diastolic CHF, acute ([place]) 01/21/2014  . NSTEMI (non-ST elevated myocardial infarction) ([place]) 01/21/2014  . LVH (left ventricular hypertrophy) due to hypertensive disease 01/13/2014    Palliative Care Assessment & Plan   Patient Profile: 65 y.o. male  with past medical history of stage IV renal cancer s/p R nephrectomy, radiation, and chemo.  He has known history of L1 mets.  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  Palliative consulted for goals of care.  Recommendations/Plan:  Pain: Reports currently poorly controlled.  He was getting approximately 12-55m IV dilaudid daily prior to change in his regimen.  I think that he will best be served by initiation of PCA to get his pain managed while allowing [facility] determine his overall needs for adequate pain management.  Started dilaudid PCA with 0.347mbasal, 0.79m29molus, and 20 minute lockout.   Goals of Care and Additional Recommendations:  Limitations on Scope of  Treatment: Full Scope Treatment  Code Status:    Code Status Orders        Start     Ordered   02/08/16 1044  Full code  Continuous     02/08/16 1043    Code Status History     Date Active Date Inactive Code Status Order ID Comments User Context   11/28/2014  2:41 PM 12/02/2014  4:25 PM Full Code [id number]  Cleon Gustin, MD Inpatient   01/27/2014  1:17 PM 01/29/2014 12:39 PM Full Code [id number]  John Giovanni, PA-C Inpatient   01/23/2014  5:51 PM 01/27/2014  1:17 PM Full Code [id number]  Sherren Mocha, MD Inpatient   01/21/2014 11:41 AM 01/23/2014  5:51 PM Full Code [id number]  Lonn Georgia, PA-C Inpatient    Advance Directive Documentation   Flowsheet Row Most Recent Value  Type of Advance Directive  Healthcare Power of Attorney  Pre-existing out of facility DNR order (yellow form or pink MOST form)  No data  "MOST" Form in Place?  No data       Prognosis:   Unable to determine  Discharge Planning:  To Be Determined  Care plan was discussed with patient, wife, Dr. Wyline Copas, RN  Thank you for allowing the Palliative Medicine Team to assist in the care of this patient.   Time In: 1850 Time Out: 1730 Total Time 40 Prolonged Time Billed No      Greater than 50%  of this time was spent counseling and coordinating care related to the above assessment and plan.  Micheline Rough, MD  Please contact Palliative Medicine Team phone at [phone] for questions and concerns.

## 2016-02-12 NOTE — Progress Notes (Signed)
Daily Progress Note   Patient Name: Derek [name]       Date: 02/12/2016 DOB: [birth year]  Age: 65 y.o. MRN#: [id number] Attending Physician: Donne Hazel, MD Primary Care Physician: Elise Benne Admit Date: 02/08/2016  Reason for Consultation/Follow-up: Establishing goals of care and Pain control  Subjective: Patient is comfortable on current PCA settings.  He has just returned from CT scan, family at bedside awaiting results and further discussions.   Length of Stay: 4  Current Medications: Scheduled Meds:  . docusate sodium  100 mg Oral BID  . febuxostat  40 mg Oral Daily  . feeding supplement (ENSURE ENLIVE)  237 mL Oral BID BM  . HYDROmorphone   Intravenous Q4H  . iopamidol      . iopamidol      . LORazepam  0.5-1 mg Oral QHS  . meropenem (MERREM) IV  1 g Intravenous Q8H  . pantoprazole (PROTONIX) IV  40 mg Intravenous Q12H  . sodium chloride      . vancomycin  1,000 mg Intravenous Q12H    Continuous Infusions: . sodium chloride 75 mL/hr at 02/12/16 0521    PRN Meds: acetaminophen **OR** acetaminophen, diphenhydrAMINE **OR** diphenhydrAMINE, diphenoxylate-atropine, magic mouthwash, naloxone **AND** sodium chloride flush, ondansetron **OR** ondansetron (ZOFRAN) IV  Physical Exam      General: Alert, awake, in no acute distress. Resting with eyes closed but responds appropriately to questions.  Chronically ill appearing. HEENT: No bruits, no goiter, no JVD Heart: Regular rate and rhythm. No murmur appreciated. Lungs: Fair air movement, clear Abdomen: Soft, globally tender, nondistended, positive bowel sounds.  Ext: +LE edema Skin: Warm and dry, pallor Neuro: Grossly intact, nonfocal.     Vital Signs: BP (!) 161/94 (BP Location: Right Arm)   Pulse 74   Temp  98.3 F (36.8 C) (Oral)   Resp 16   Ht 5\' 8"  (1.727 m)   Wt 73 kg (161 lb)   SpO2 95%   BMI 24.48 kg/m  SpO2: SpO2: 95 % O2 Device: O2 Device: Not Delivered O2 Flow Rate: O2 Flow Rate (L/min): 0 L/min  Intake/output summary:   Intake/Output Summary (Last 24 hours) at 02/12/16 1352 Last data filed at 02/12/16 1000  Gross per 24 hour  Intake             2525  ml  Output              725 ml  Net             1800 ml   LBM: Last BM Date: 02/08/16 Baseline Weight: Weight: 73 kg (161 lb) Most recent weight: Weight: 73 kg (161 lb)       Palliative Assessment/Data:    Flowsheet Rows   Flowsheet Row Most Recent Value  Intake Tab  Referral Department  Hospitalist  Unit at Time of Referral  Med/Surg Unit  Palliative Care Primary Diagnosis  Cancer  Date Notified  02/09/16  Palliative Care Type  New Palliative care  Reason for referral  Clarify Goals of Care  Date of Admission  02/08/16  Date first seen by Palliative Care  02/09/16  # of days Palliative referral response time  0 Day(s)  # of days IP prior to Palliative referral  1  Clinical Assessment  Palliative Performance Scale Score  40%  Pain Max last 24 hours  8  Pain Min Last 24 hours  0  Psychosocial & Spiritual Assessment  Palliative Care Outcomes  Patient/Family meeting held?  Yes  Who was at the meeting?  Patient, wife, 2 sons, dtr in Sports coach, family friend  Palliative Care Outcomes  Clarified goals of care      Patient Active Problem List   Diagnosis Date Noted  . Protein-calorie malnutrition, severe 02/12/2016  . Palliative care encounter   . Neoplasm related pain   . Intraabdominal fluid collection   . Coronary artery disease 02/10/2016  . Unintentional weight loss 02/10/2016  . Protein-calorie malnutrition ([place]) 02/10/2016  . Retroperitoneal air 02/10/2016  . Acute lower GI bleeding 02/08/2016  . Acute GI bleeding 02/08/2016  . Diarrhea 10/29/2015  . Dehydration 10/29/2015  . L1 Metastasis 09/27/2015    . Medication management 09/21/2015  . Hyperlipidemia 12/11/2014  . Normocytic anemia 12/02/2014  . Metastatic renal cell carcinoma ([place]) 11/28/2014  . Gout 02/17/2014  . 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 HTN (hypertension) 01/23/2014  . Obesity 01/23/2014  . Prediabetes 01/23/2014  . Systolic and diastolic CHF, acute ([place]) 01/21/2014  . NSTEMI (non-ST elevated myocardial infarction) ([place]) 01/21/2014  . LVH (left ventricular hypertrophy) due to hypertensive disease 01/13/2014    Palliative Care Assessment & Plan   Patient Profile: 65 y.o. male  with past medical history of stage IV renal cancer s/p R nephrectomy, radiation, and chemo.  He has known history of L1 mets.  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  Palliative consulted for goals of care.  Recommendations/Plan: Pain: continue current PCA settings. Follow up in am on CT results, input from surgery. Re assured wife and family about how palliative care can help going forward.   Goals of Care and Additional Recommendations:  Limitations on Scope of Treatment: Full Scope Treatment  Code Status:    Code Status Orders        Start     Ordered   02/08/16 1044  Full code  Continuous     02/08/16 1043    Code Status History    Date Active Date Inactive Code Status Order ID Comments User Context   11/28/2014  2:41 PM 12/02/2014  4:25 PM Full Code [id number]  Cleon Gustin, MD Inpatient   01/27/2014  1:17 PM  01/29/2014 12:39 PM Full Code [id number]  Odis Luster Inpatient   01/23/2014  5:51 PM 01/27/2014  1:17 PM Full Code [id number]  Sherren Mocha, MD Inpatient   01/21/2014 11:41 AM 01/23/2014  5:51 PM Full Code [id number]  Lonn Georgia, PA-C Inpatient    Advance Directive Documentation   Flowsheet Row Most Recent Value   Type of Advance Directive  Healthcare Power of Attorney  Pre-existing out of facility DNR order (yellow form or pink MOST form)  No data  "MOST" Form in Place?  No data       Prognosis:   guarded.   Discharge Planning:  To Be Determined  Care plan was discussed with patient, wife, son  Thank you for allowing the Palliative Medicine Team to assist in the care of this patient.   Time In: 1300 Time Out: 1325 Total Time 25 Prolonged Time Billed No      Greater than 50%  of this time was spent counseling and coordinating care related to the above assessment and plan.  Loistine Chance, MD [phone]  Please contact Palliative Medicine Team phone at [phone] for questions and concerns.

## 2016-02-12 NOTE — Progress Notes (Signed)
Assessment Principal Problem:   Contained perforation of right colon at hepatic flexure-confirmed on CT Active Problems:     S/P CABG x 4   Metastatic renal cell carcinoma (HCC)-aggressive tumor     L1 Metastasis   Protein-calorie malnutrition, severe   Neoplasm related pain   Plan:  Check prealbumin.  Operatively, he would required an exploratory laparotomy, partial colectomy and likely colostomy or ileostomy. I explained the procedure and risks of colon resection.  Risks include but are not limited to bleeding, infection, wound problems, anesthesia,need for reoperative surgery,  injury to intraabominal organs (such as intestine, spleen, kidney, bladder, ureter, etc.), ileus, irregular bowel habits, poorer quality of life, death.  The other option is palliative care.  I have discussed this with Dr. Osker Mason as well.  If the he and the family want to proceed, he would like need TPN to improve his malnutrition.  I do not think he would do well with the surgery in his current condition and he would still have the metastatic cancer to deal with.  LOS: 4 days        Subjective: Continued back pain.  No abdominal pain.  Not eating much (and has not done so for about 10) days.  Very weak.  Hardly opens his eyes when I speak with him.  Family in room.  Objective: Vital signs in last 24 hours: Temp:  [98.3 F (36.8 C)] 98.3 F (36.8 C) (12/12 1100) Pulse Rate:  [74-75] 74 (12/12 1100) Resp:  [16-18] 16 (12/12 1200) BP: (147-161)/(69-94) 161/94 (12/12 1100) SpO2:  [95 %-98 %] 95 % (12/12 1200) Last BM Date: 02/08/16  Intake/Output from previous day: 12/11 0701 - 12/12 0700 In: 2525 [I.V.:1725; IV Piggyback:800] Out: 725 [Urine:725] Intake/Output this shift: No intake/output data recorded.  PE: General- In NAD Abdomen-soft, not tender  Lab Results:   Recent Labs  02/11/16 0444 02/12/16 0444  WBC 7.8 7.6  HGB 7.3* 7.8*  HCT 20.7* 22.8*  PLT 309 347   BMET  Recent Labs  02/11/16 0444 02/12/16 0444  NA 128* 128*  K 3.2* 3.8  CL 95* 96*  CO2 27 27  GLUCOSE 112* 111*  BUN 11 11  CREATININE 0.73 0.74  CALCIUM 7.6* 7.5*   PT/INR No results for input(s): LABPROT, INR in the last 72 hours. Comprehensive Metabolic Panel:    Component Value Date/Time   NA 128 (L) 02/12/2016 0444   NA 128 (L) 02/11/2016 0444   NA 128 (L) 02/04/2016 1116   NA 140 01/22/2016 0956   K 3.8 02/12/2016 0444   K 3.2 (L) 02/11/2016 0444   K 5.1 02/04/2016 1116   K 5.0 01/22/2016 0956   CL 96 (L) 02/12/2016 0444   CL 95 (L) 02/11/2016 0444   CO2 27 02/12/2016 0444   CO2 27 02/11/2016 0444   CO2 27 02/04/2016 1116   CO2 26 01/22/2016 0956   BUN 11 02/12/2016 0444   BUN 11 02/11/2016 0444   BUN 16.0 02/04/2016 1116   BUN 14.4 01/22/2016 0956   CREATININE 0.74 02/12/2016 0444   CREATININE 0.73 02/11/2016 0444   CREATININE 0.9 02/04/2016 1116   CREATININE 0.9 01/22/2016 0956   GLUCOSE 111 (H) 02/12/2016 0444   GLUCOSE 112 (H) 02/11/2016 0444   GLUCOSE 119 02/04/2016 1116   GLUCOSE 97 01/22/2016 0956   CALCIUM 7.5 (L) 02/12/2016 0444   CALCIUM 7.6 (L) 02/11/2016 0444   CALCIUM 9.8 02/04/2016 1116   CALCIUM 9.7 01/22/2016 0956   AST  19 02/08/2016 0716   AST 17 02/04/2016 1116   AST 21 01/22/2016 0956   ALT 14 (L) 02/08/2016 0716   ALT 13 02/04/2016 1116   ALT 22 01/22/2016 0956   ALKPHOS 75 02/08/2016 0716   ALKPHOS 88 02/04/2016 1116   ALKPHOS 75 01/22/2016 0956   BILITOT 0.7 02/08/2016 0716   BILITOT 0.65 02/04/2016 1116   BILITOT 0.44 01/22/2016 0956   PROT 5.9 (L) 02/08/2016 0716   PROT 7.4 02/04/2016 1116   PROT 7.2 01/22/2016 0956   ALBUMIN 2.3 (L) 02/08/2016 0716   ALBUMIN 2.5 (L) 02/04/2016 1116   ALBUMIN 2.9 (L) 01/22/2016 0956     Studies/Results: Ct Abdomen Pelvis W Contrast  Result Date: 02/12/2016 CLINICAL DATA:  Known L1 compression deformity with adjacent bony destruction and findings suggestive of localized infection. EXAM: CT ABDOMEN  AND PELVIS WITH CONTRAST TECHNIQUE: Multidetector CT imaging of the abdomen and pelvis was performed using the standard protocol following bolus administration of intravenous contrast. CONTRAST:  181mL [id number] COMPARISON:  02/08/2016 FINDINGS: Lower chest: Bilateral pleural effusions with bibasilar atelectasis noted. Hepatobiliary: Enhancing nodule is again noted in the medial aspect of the right lobe of the liver stable from the prior exam. Stable hypodensities are noted in the inferior aspect of the right lobe of the liver adjacent to the prior nephrectomy site. Postsurgical changes in the liver laterally are noted as well. The gallbladder is well distended with multiple gallstones. No obstructive changes are seen. Pancreas: Unremarkable. No pancreatic ductal dilatation or surrounding inflammatory changes. Spleen: Normal in size without focal abnormality. Adrenals/Urinary Tract: The left adrenal gland and left kidney are stable from the prior exam. A large cyst is noted as well as some renal vascular calcifications. No obstructive changes are seen. The right kidney has been surgically removed. The right adrenal gland appears within normal limits. The bladder is well distended with a Foley catheter in place. Air is noted likely related to the Foley placement. Stomach/Bowel: No obstructive changes are seen. There is some significant wall thickening in the ascending colon adjacent to the area of abnormality within and adjacent to the L1 vertebral body. There remains a considerable of mottled air and fluid density within the L1 vertebral body and extending into the spinal canal as well as into the paraspinal tissues on the right. These changes are intimately involved with the wall thickening in the colon and may be related to localized perforation. There is an apparent area of breakdown of the medial wall of the ascending colon best seen on image number 36 of series 2. It Is better delineated on today's exam due to  the administration of oral contrast. No obstructive changes are seen. No other focal abnormality in the colon is noted. Vascular/Lymphatic: Aortic atherosclerosis. No enlarged abdominal or pelvic lymph nodes. Reproductive: Prostate is unremarkable. Other: No abdominal wall hernia or abnormality. No abdominopelvic ascites. Musculoskeletal: There remain changes as described above in the L1 vertebral body. The degree of mottled air extrinsic to the vertebral body particularly anteriorly behind the aorta has increased. Lytic lesion is noted in the right half of the L2 vertebral body consistent with metastatic disease. This also has a small amount of air within but stable from the prior exam. Air is also noted in the L1-2 disc space. IMPRESSION: Persistent changes of L1 compression deformity with superimposed mottled air and fluid consistent with localized infection. There is irregularity of the medial wall of the ascending colon as described suggestive of contained perforation. This would  account for the local spread of apparent infection. Lytic lesion within the L2 vertebral body stable from the prior exam. Stable enhancing lesion in the right hepatic lobe consistent with metastatic disease. Areas of decreased attenuation within the tip of the liver adjacent to the area of abnormality in the colon. This may represent some localized inflammatory change as well. Increased bilateral pleural effusions with bibasilar atelectasis Electronically Signed   By: Inez Catalina M.D.   On: 02/12/2016 13:54    Anti-infectives: Anti-infectives    Start     Dose/Rate Route Frequency Ordered Stop   02/10/16 1130  meropenem (MERREM) 1 g in sodium chloride 0.9 % 100 mL IVPB     1 g 200 mL/hr over 30 Minutes Intravenous Every 8 hours 02/10/16 1107     02/09/16 1830  vancomycin (VANCOCIN) IVPB 1000 mg/200 mL premix     1,000 mg 200 mL/hr over 60 Minutes Intravenous Every 12 hours 02/09/16 1759     02/09/16 1800  ceFEPIme  (MAXIPIME) 2 g in dextrose 5 % 50 mL IVPB  Status:  Discontinued     2 g 100 mL/hr over 30 Minutes Intravenous Every 8 hours 02/09/16 1757 02/10/16 1050       Aanika Defoor J 02/12/2016

## 2016-02-12 NOTE — Progress Notes (Addendum)
PROGRESS NOTE    Derek [name]  ER:[id number] DOB: [birth year] DOA: 02/08/2016 PCP: Mackie Pai, PA-C    Brief Narrative:  65 year old male with metastatic renal cell carcinoma, hypertension, CAD, recent L1 pathological compression fracture with acute back pain presented to ED with rectal bleeding. Patient has been having intractable lower back pain due to L1 compression fracture and had been taking Dilaudid and long-acting morphine, mostly bedbound in the last 1 week. Family had been trying to schedule outpatient kyphoplasty with Dr. Estanislado Pandy. This morning, patient woke up around 4:30am with bright red blood in the stool, subsequently had 2 more episodes. Per patient's son, he had 2 syncopal episodes, has been feeling dizzy and lightheaded since then. Patient is on aspirin 81 mg daily, not on any anticoagulation or NSAIDS. Patient denies any abdominal pain nausea or vomiting. Per family patient has been nonambulatory in the last 1 week due to acute back pain from newly diagnosed L1 pathological compression fracture. 10 days ago he was ambulating with a cane and had gone on a cruise.  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 Neurosurgery, General Surgery, ID, IR are following. Palliative Care was consulted and is assisting with pain management.   Assessment & Plan:   Principal Problem:   Retroperitoneal air Active Problems:   HTN (hypertension)   Prediabetes   S/P CABG x 4   Metastatic renal cell carcinoma (HCC)   Normocytic anemia   Hyperlipidemia   L1 Metastasis   Dehydration   Acute lower GI bleeding   Acute GI bleeding   Coronary artery disease   Unintentional weight loss   Protein-calorie malnutrition ([place])   Intraabdominal fluid collection   Palliative care encounter   Neoplasm related pain   Protein-calorie malnutrition, severe  Principal Problem:   Acute lower  GI bleeding - Hemoglobin currently 7.8 - GI initially consulted. Presently not candidate for endoscopy secondary to below issues - follow up CBC in AM  Active Problems:  Hypotension with underlying history of HTN (hypertension) - BP currently remains stable - at present, lisinopril, Coreg remain on hold    Metastatic renal cell carcinoma (HCC) with L1 metastatic disease, compression fracture, intractable back pain - Patient is s/p nephrectomy in 2016 and follows Dr. Alen Blew. Outpatient records reviewed. Patient is currently undergoing chemo. 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 - Appreciate input by General Surgery, Neurosurgery, ID, and IR.  - Pt is to have follow up CT abd/pelvis. Additional surgical recommendations are to be determined based on this scan - appreciate assistance by Palliative Care for pain management    Hyponatremia with Dehydration - Patient is continued on IVF - Sodium remains stable at 128 - Repeat BMET in AM  Bladder outlet obstruction -Patient noted to have bladder outlet obstruction -For now, will continue with indwelling foley cath given severity of patient's condition  Acute on Chronic pain - Pain remained poorly controlled despite scheduled long-acting morphine with PRN dialudid for break through pain -Discussed case with Palliative Care. Appreciate input and recs for PCA. When seen, patient appears more comfortable.  DVT prophylaxis: SCD's Code Status: Full Family Communication: Pt in room, Family at bedside Disposition Plan: Uncertain at this  time  Consultants:   Neurosurgery  ID  IR  General Surgery  Palliative Care  Procedures:     Antimicrobials: Anti-infectives     Start     Dose/Rate Route Frequency Ordered Stop   02/10/16 1130  meropenem (MERREM) 1 g in sodium chloride 0.9 % 100 mL IVPB     1 g 200 mL/hr over 30 Minutes Intravenous Every 8 hours 02/10/16 1107     02/09/16 1830  vancomycin (VANCOCIN) IVPB 1000 mg/200 mL premix     1,000 mg 200 mL/hr over 60 Minutes Intravenous Every 12 hours 02/09/16 1759     02/09/16 1800  ceFEPIme (MAXIPIME) 2 g in dextrose 5 % 50 mL IVPB  Status:  Discontinued     2 g 100 mL/hr over 30 Minutes Intravenous Every 8 hours 02/09/16 1757 02/10/16 1050      Subjective: No complaints this AM  Objective: Vitals:   02/12/16 0442 02/12/16 0800 02/12/16 1100 02/12/16 1200  BP: (!) 148/69  (!) 161/94   Pulse: 74  74   Resp: 16 16 16 16   Temp: 98.3 F (36.8 C)  98.3 F (36.8 C)   TempSrc: Oral  Oral   SpO2: 96% 95% 96% 95%  Weight:      Height:        Intake/Output Summary (Last 24 hours) at 02/12/16 1439 Last data filed at 02/12/16 1000  Gross per 24 hour  Intake             2525 ml  Output              625 ml  Net             1900 ml   Filed Weights   02/08/16 0640  Weight: 73 kg (161 lb)    Examination:  General exam: Awake, in nad, conversant Respiratory system: normal chest rise, no audible wheezing Cardiovascular system: regular rate, s1, s2 Gastrointestinal system: soft, nondistended, pos BS Central nervous system: cn2-12 grossly intact, strength intact Extremities: no clubbing, perfused Skin: normal skin turgor, no notable skin lesions seen Psychiatry: mood normal// no visual hallucinations  Data Reviewed: I have personally reviewed following labs and imaging studies  CBC:  Recent Labs Lab 02/08/16 0716  02/09/16 0514 02/09/16 1035 02/10/16 0533 02/11/16 0444 02/12/16 0444  WBC 9.4  --  10.1  --  8.6 7.8 7.6  NEUTROABS 8.5*  --   --   --   --   --   --   HGB 8.6*  < > 8.1* 8.1* 7.6* 7.3* 7.8*  HCT 24.6*  < > 23.1* 23.0* 22.1* 20.7* 22.8*  MCV 92.5  --  92.8  --  92.9 89.2  91.6  PLT 277  --  291  --  311 309 347  < > = values in this interval not displayed. Basic Metabolic Panel:  Recent Labs Lab 02/08/16 0716 02/09/16 0514 02/10/16 0533 02/11/16 0444 02/12/16 0444  NA 127* 128* 130* 128* 128*  K 4.4 4.5 3.9 3.2* 3.8  CL 91* 95* 97* 95* 96*  CO2 26 27 26 27 27   GLUCOSE 135* 111* 112* 112* 111*  BUN 25* 18 15 11 11   CREATININE 0.92 0.77 0.90 0.73 0.74  CALCIUM 8.0* 8.0* 7.9* 7.6* 7.5*   GFR: Estimated Creatinine Clearance: 86.7 mL/min (by C-G formula based on SCr of 0.74 mg/dL). Liver Function Tests:  Recent Labs Lab 02/08/16 0716  AST 19  ALT 14*  ALKPHOS 75  BILITOT  0.7  PROT 5.9*  ALBUMIN 2.3*   No results for input(s): LIPASE, AMYLASE in the last 168 hours. No results for input(s): AMMONIA in the last 168 hours. Coagulation Profile: No results for input(s): INR, PROTIME in the last 168 hours. Cardiac Enzymes: No results for input(s): CKTOTAL, CKMB, CKMBINDEX, TROPONINI in the last 168 hours. BNP (last 3 results) No results for input(s): PROBNP in the last 8760 hours. HbA1C: No results for input(s): HGBA1C in the last 72 hours. CBG: No results for input(s): GLUCAP in the last 168 hours. Lipid Profile: No results for input(s): CHOL, HDL, LDLCALC, TRIG, CHOLHDL, LDLDIRECT in the last 72 hours. Thyroid Function Tests: No results for input(s): TSH, T4TOTAL, FREET4, T3FREE, THYROIDAB in the last 72 hours. Anemia Panel: No results for input(s): VITAMINB12, FOLATE, FERRITIN, TIBC, IRON, RETICCTPCT in the last 72 hours. Sepsis Labs:  Recent Labs Lab 02/08/16 0725  LATICACIDVEN 1.51    Recent Results (from the past 240 hour(s))  Urine culture     Status: Abnormal   Collection Time: 02/08/16  7:29 PM  Result Value Ref Range Status   Specimen Description URINE, CLEAN CATCH  Final   Special Requests NONE  Final   Culture 60,000 COLONIES/mL STAPHYLOCOCCUS HAEMOLYTICUS (A)  Final   Report Status 02/11/2016 FINAL  Final    Organism ID, Bacteria STAPHYLOCOCCUS HAEMOLYTICUS (A)  Final      Susceptibility   Staphylococcus haemolyticus - MIC*    CIPROFLOXACIN <=0.5 SENSITIVE Sensitive     GENTAMICIN <=0.5 SENSITIVE Sensitive     NITROFURANTOIN <=16 SENSITIVE Sensitive     OXACILLIN <=0.25 SENSITIVE Sensitive     TETRACYCLINE >=16 RESISTANT Resistant     VANCOMYCIN <=0.5 SENSITIVE Sensitive     TRIMETH/SULFA <=10 SENSITIVE Sensitive     CLINDAMYCIN <=0.25 SENSITIVE Sensitive     RIFAMPIN <=0.5 SENSITIVE Sensitive     Inducible Clindamycin NEGATIVE Sensitive     * 60,000 COLONIES/mL STAPHYLOCOCCUS HAEMOLYTICUS     Radiology Studies: Ct Abdomen Pelvis W Contrast  Result Date: 02/12/2016 CLINICAL DATA:  Known L1 compression deformity with adjacent bony destruction and findings suggestive of localized infection. EXAM: CT ABDOMEN AND PELVIS WITH CONTRAST TECHNIQUE: Multidetector CT imaging of the abdomen and pelvis was performed using the standard protocol following bolus administration of intravenous contrast. CONTRAST:  123mL ISOVUE-300 COMPARISON:  02/08/2016 FINDINGS: Lower chest: Bilateral pleural effusions with bibasilar atelectasis noted. Hepatobiliary: Enhancing nodule is again noted in the medial aspect of the right lobe of the liver stable from the prior exam. Stable hypodensities are noted in the inferior aspect of the right lobe of the liver adjacent to the prior nephrectomy site. Postsurgical changes in the liver laterally are noted as well. The gallbladder is well distended with multiple gallstones. No obstructive changes are seen. Pancreas: Unremarkable. No pancreatic ductal dilatation or surrounding inflammatory changes. Spleen: Normal in size without focal abnormality. Adrenals/Urinary Tract: The left adrenal gland and left kidney are stable from the prior exam. A large cyst is noted as well as some renal vascular calcifications. No obstructive changes are seen. The right kidney has been surgically removed.  The right adrenal gland appears within normal limits. The bladder is well distended with a Foley catheter in place. Air is noted likely related to the Foley placement. Stomach/Bowel: No obstructive changes are seen. There is some significant wall thickening in the ascending colon adjacent to the area of abnormality within and adjacent to the L1 vertebral body. There remains a considerable of mottled air and  fluid density within the L1 vertebral body and extending into the spinal canal as well as into the paraspinal tissues on the right. These changes are intimately involved with the wall thickening in the colon and may be related to localized perforation. There is an apparent area of breakdown of the medial wall of the ascending colon best seen on image number 36 of series 2. It Is better delineated on today's exam due to the administration of oral contrast. No obstructive changes are seen. No other focal abnormality in the colon is noted. Vascular/Lymphatic: Aortic atherosclerosis. No enlarged abdominal or pelvic lymph nodes. Reproductive: Prostate is unremarkable. Other: No abdominal wall hernia or abnormality. No abdominopelvic ascites. Musculoskeletal: There remain changes as described above in the L1 vertebral body. The degree of mottled air extrinsic to the vertebral body particularly anteriorly behind the aorta has increased. Lytic lesion is noted in the right half of the L2 vertebral body consistent with metastatic disease. This also has a small amount of air within but stable from the prior exam. Air is also noted in the L1-2 disc space. IMPRESSION: Persistent changes of L1 compression deformity with superimposed mottled air and fluid consistent with localized infection. There is irregularity of the medial wall of the ascending colon as described suggestive of contained perforation. This would account for the local spread of apparent infection. Lytic lesion within the L2 vertebral body stable from the prior  exam. Stable enhancing lesion in the right hepatic lobe consistent with metastatic disease. Areas of decreased attenuation within the tip of the liver adjacent to the area of abnormality in the colon. This may represent some localized inflammatory change as well. Increased bilateral pleural effusions with bibasilar atelectasis Electronically Signed   By: Inez Catalina M.D.   On: 02/12/2016 13:54    Scheduled Meds: . docusate sodium  100 mg Oral BID  . febuxostat  40 mg Oral Daily  . feeding supplement (ENSURE ENLIVE)  237 mL Oral BID BM  . HYDROmorphone   Intravenous Q4H  . iopamidol      . iopamidol      . LORazepam  0.5-1 mg Oral QHS  . meropenem (MERREM) IV  1 g Intravenous Q8H  . pantoprazole (PROTONIX) IV  40 mg Intravenous Q12H  . sodium chloride      . vancomycin  1,000 mg Intravenous Q12H   Continuous Infusions: . sodium chloride 75 mL/hr at 02/12/16 0521     LOS: 4 days   Athalene Kolle, Orpah Melter, MD [facility] Pager [phone]  If 7PM-7AM, please contact night-coverage [email or web address] Password [facility] 02/12/2016, 2:39 PM

## 2016-02-12 NOTE — Progress Notes (Signed)
Patient ID: Eros Greenfield, male   DOB: [birth year], 65 y.o.   MRN: [id number] Subjective:  the patient is somnolent but easily arousable.  He continues complaining of back pain.  We are awaiting the CT of his abdomen and pelvis with contrast.  Objective: Vital signs in last 24 hours: Temp:  [98.3 F (36.8 C)-98.8 F (37.1 C)] 98.3 F (36.8 C) (12/12 0442) Pulse Rate:  [74-75] 74 (12/12 0442) Resp:  [16-18] 16 (12/12 0442) BP: (140-148)/(69-82) 148/69 (12/12 0442) SpO2:  [96 %-98 %] 96 % (12/12 0442)  Intake/Output from previous day: 12/11 0701 - 12/12 0700 In: 2525 [I.V.:1725; IV Piggyback:800] Out: 725 [Urine:725] Intake/Output this shift: No intake/output data recorded.  Physical exam the patient is somewhat but easily arousable.  He is oriented.  His strength is grossly normal in his bilateral gastrocnemius and dorsiflexors.  Lab Results:  Recent Labs  02/11/16 0444 02/12/16 0444  WBC 7.8 7.6  HGB 7.3* 7.8*  HCT 20.7* 22.8*  PLT 309 347   BMET  Recent Labs  02/11/16 0444 02/12/16 0444  NA 128* 128*  K 3.2* 3.8  CL 95* 96*  CO2 27 27  GLUCOSE 112* 111*  BUN 11 11  CREATININE 0.73 0.74  CALCIUM 7.6* 7.5*    Studies/Results: No results found.  Assessment/Plan: L1 pathologic fracture, lumbago, retroperitoneal process: I have again discussed the situation with the patient his wife, and son.  They understand that this is a very serious situation.  I have again explained that it is possible to decompress and stabilize his spine. However this would be futile if it turns out he has a colon perforation with contamination of the area. We will await the results of his repeat abdominal and pelvic CT to help [place] determine whether he has an appropriate surgical candidate.  They all understand that he is quite ill , has a poor prognosis and not a  good surgical candidate. I have suggested that he embrace palliative care.  LOS: 4 days     Bakary Bramer D 02/12/2016, 10:05  AM

## 2016-02-12 NOTE — Progress Notes (Signed)
IP PROGRESS NOTE  Subjective:   Events noted overnight without any major changes. He is currently on Dilaudid PCA and appears comfortable at this time. Has not reported any recent fevers or chills.  Objective:  Vital signs in last 24 hours: Temp:  [98.3 F (36.8 C)-98.8 F (37.1 C)] 98.3 F (36.8 C) (12/12 1100) Pulse Rate:  [74-75] 74 (12/12 1100) Resp:  [16-18] 16 (12/12 1100) BP: (140-161)/(69-94) 161/94 (12/12 1100) SpO2:  [96 %-98 %] 96 % (12/12 1100) Weight change:  Last BM Date: 02/08/16  Intake/Output from previous day: 12/11 0701 - 12/12 0700 In: 2525 [I.V.:1725; IV Piggyback:800] Out: 52 [Urine:725] Ill-appearing gentleman other arousable today. Mouth: Dry mucous membranes with oral ulcers noted. Resp: clear to auscultation bilaterally Cardio: regular rate and rhythm, S1, S2 normal, no murmur, click, rub or gallop GI: soft, non-tender; bowel sounds normal; no masses,  no organomegaly Extremities: extremities normal, atraumatic, no cyanosis or edema    Lab Results:  Recent Labs  02/11/16 0444 02/12/16 0444  WBC 7.8 7.6  HGB 7.3* 7.8*  HCT 20.7* 22.8*  PLT 309 347    BMET  Recent Labs  02/11/16 0444 02/12/16 0444  NA 128* 128*  K 3.2* 3.8  CL 95* 96*  CO2 27 27  GLUCOSE 112* 111*  BUN 11 11  CREATININE 0.73 0.74  CALCIUM 7.6* 7.5*    Studies/Results: No results found.  Medications: I have reviewed the patient's current medications.  Assessment/Plan:  65 year old with the following issues:  1. Metastatic renal cell carcinoma: He is currently on salvage therapy utilizing Cabometyx which has been withheld at this time.   I agree with holding treatment at this time given the acute illness he is experiencing.This will be resumed at a later date.  2. 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 Input from infectious disease, neurosurgery and Gen. surgery is appreciated. It is a  certainly complex situation and I defer the management of this to the services.  CT scan is scheduled to be performed today will shed some more light about treatment options.  3. Pain: Controlled at this time with Dilaudid PCA. Appreciate input from palliative medicine services.  I addressed the patient and his wife's concerns today the best of my ability. They understand his prognosis is rather poor but would like to continue to be aggressive at this time.   LOS: 4 days   [facility] 02/12/2016, 11:53 AM

## 2016-02-12 NOTE — Progress Notes (Signed)
Pharmacy Antibiotic Note  Derek [name] is a 65 y.o. 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  Pharmacy was initially consulted for Cefepime/Vancomycin dosing, now changed to Meropenem/Vancomycin per ID consult.  Today, 02/12/2016: - day #4 vancomycin, day #3 meropenem - afeb, wbc wnl - scr 0.74 (crcl~86) - plan for repeat abd/pelvic CT on 12/12 to determine course of treatment  Plan:  Continue Meropenem 1g IV q8h.  Continue Vancomycin 1g IV q12h  Will check vancomycin trough level with evening dose today to assess current regimen.  Pharmacy will f/u and adjust if needed.  Follow up renal fxn, culture results, and clinical course.  ________________________________  Height: 5\' 8"  (172.7 cm) Weight: 161 lb (73 kg) IBW/kg (Calculated) : 68.4  Temp (24hrs), Avg:98.5 F (36.9 C), Min:98.3 F (36.8 C), Max:98.8 F (37.1 C)   Recent Labs Lab 02/08/16 0716 02/08/16 0725 02/09/16 0514 02/10/16 0533 02/11/16 0444 02/12/16 0444  WBC 9.4  --  10.1 8.6 7.8 7.6  CREATININE 0.92  --  0.77 0.90 0.73 0.74  LATICACIDVEN  --  1.51  --   --   --   --     Estimated Creatinine Clearance: 86.7 mL/min (by C-G formula based on SCr of 0.74 mg/dL).    Allergies  Allergen Reactions  . Lipitor [Atorvastatin] Other (See Comments)    Myopathy...severe, with  Myoglobinuria, wheelchair bound for a few days  . Penicillins Anaphylaxis    As a child Has patient had a PCN reaction causing immediate rash, facial/tongue/throat swelling, SOB or lightheadedness with hypotension: yes Has patient had a PCN reaction causing severe rash involving mucus membranes or skin necrosis: yes Has patient had a PCN reaction that required hospitalization: yes Has patient had a PCN reaction occurring within the last 10 years: no If all of the above  answers are "NO", then may proceed with Cephalosporin use.   . Prednisone Anxiety and Other (See Comments)    Extremely high blood pressure after taking medication for first time.    Antimicrobials this admission:  12/9 cefepime >> 12/9 12/9 vancomycin >>  12/10 meropenem >>  Microbiology results:  12/8 UCx: 60k CoNS FINAL   Thank you for allowing pharmacy to be a part of this patient's care.  Dia Sitter, PharmD, BCPS 02/12/2016 11:17 AM

## 2016-02-13 ENCOUNTER — Inpatient Hospital Stay (HOSPITAL_COMMUNITY): Payer: Medicare Other

## 2016-02-13 ENCOUNTER — Encounter (HOSPITAL_COMMUNITY)
Admission: EM | Disposition: A | Discharge status: Home Health | Payer: Self-pay | Source: Home / Self Care | Attending: Internal Medicine

## 2016-02-13 DIAGNOSIS — K6819 Other retroperitoneal abscess: Secondary | ICD-10-CM

## 2016-02-13 DIAGNOSIS — K631 Perforation of intestine (nontraumatic): Secondary | ICD-10-CM

## 2016-02-13 DIAGNOSIS — E43 Unspecified severe protein-calorie malnutrition: Secondary | ICD-10-CM

## 2016-02-13 SURGERY — POSTERIOR LUMBAR FUSION 4 LEVEL
Anesthesia: General

## 2016-02-13 NOTE — Plan of Care (Signed)
Problem: Activity: Goal: Risk for activity intolerance will decrease Outcome: Not Progressing Patient will refuse to turn every 2 hours. Pt encouraged to do so. Education provided

## 2016-02-13 NOTE — Progress Notes (Signed)
Today patient and family request pt not be turned every 2 hours. Pt c/o continuous pain in back and lower extremities. Patient was turned once this am.

## 2016-02-13 NOTE — Progress Notes (Signed)
PROGRESS NOTE    Derek [name]  ER:[id number] DOB: October 02, 1950 DOA: 02/08/2016 PCP: Mackie Pai, PA-C    Brief Narrative:  65 year old male with metastatic renal cell carcinoma, hypertension, CAD, recent L1 pathological compression fracture with acute back pain presented to ED with rectal bleeding. Patient has been having intractable lower back pain due to L1 compression fracture and had been taking Dilaudid and long-acting morphine, mostly bedbound in the last 1 week. Family had been trying to schedule outpatient kyphoplasty with Dr. Estanislado Pandy. This morning, patient woke up around 4:30am with bright red blood in the stool, subsequently had 2 more episodes. Per patient's son, he had 2 syncopal episodes, has been feeling dizzy and lightheaded since then. Patient is on aspirin 81 mg daily, not on any anticoagulation or NSAIDS. Patient denies any abdominal pain nausea or vomiting. Per family patient has been nonambulatory in the last 1 week due to acute back pain from newly diagnosed L1 pathological compression fracture. 10 days ago he was ambulating with a cane and had gone on a cruise.  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 Neurosurgery, General Surgery, ID, IR are following. Palliative Care was consulted and is assisting with pain management. After prolonged discussion of options available to them, the family has opted to begin NGT and tube feeding in an effort to improve his mental status and/or optimize for surgery.   Assessment & Plan:   Principal Problem:   Retroperitoneal air Active Problems:   HTN (hypertension)   Prediabetes   S/P CABG x 4   Metastatic renal cell carcinoma (HCC)   Normocytic anemia   Hyperlipidemia   L1 Metastasis   Dehydration   Acute lower GI bleeding   Acute GI bleeding   Coronary artery disease   Unintentional weight loss   Protein-calorie  malnutrition ([place])   Intraabdominal fluid collection   Palliative care encounter   Neoplasm related pain   Protein-calorie malnutrition, severe  Contained perforation of right colon at hepatic flexure:  - Not a surgical candidate at this time.  - After extensive discussion with pt and family, will attempt NG feeds for now to improve nutritional status. If this fails to help, will take comfort approach.   Severe protein-calorie malnutrition: Prealbumin < 5 and poor po x several weeks.  - Trial of NG feeds as above - Discussed with family that this is consistent with process of dying.     Acute lower GI bleeding: Resolved. Hgb low but stable. in 7's. GI initially consulted. Presently not candidate for endoscopy secondary to below issues. - follow up CBC in AM   Hypotension with underlying history of HTN: BP currently remains stable - Hold coreg, lisinopril.    Metastatic renal cell carcinoma (HCC) with L1 metastatic disease, compression fracture, intractable back pain: Patient is s/p nephrectomy in 2016 and follows Dr. Alen Blew. Outpatient records reviewed. Patient is currently undergoing chemo. Given below findings, will hold chemo for the time being. 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 - CT abd/pelvis reviewed with radiologist which revealed perispinal abscess with fistula to the hepatic flexure: Pt not surgical candidate - Appreciate input by General Surgery, Neurosurgery, ID, and IR.  - Appreciate assistance by Palliative Care for pain management, hope to transition to goals of care discussions.     Hyponatremia with dehydration - Patient is continued on  IVF - Sodium remains stable at 128 - Repeat BMET in AM  Bladder outlet obstruction - Continue foley cath given severity of patient's condition  Acute on Chronic pain - Appreciate palliative input and recs for PCA. When seen, patient  appears more comfortable.  DVT prophylaxis: SCD's Code Status: Full Family Communication: Discussed at length (>43min) with patient, son, wife, and family friends.  Disposition Plan: Uncertain at this time  Consultants:   Neurosurgery  ID  IR  General Surgery  Palliative Care  Procedures:   None  Antimicrobials: Anti-infectives    Start     Dose/Rate Route Frequency Ordered Stop   02/13/16 0600  vancomycin (VANCOCIN) IVPB 750 mg/150 ml premix     750 mg 150 mL/hr over 60 Minutes Intravenous Every 8 hours 02/12/16 2140     02/12/16 2145  vancomycin (VANCOCIN) IVPB 1000 mg/200 mL premix     1,000 mg 200 mL/hr over 60 Minutes Intravenous STAT 02/12/16 2140 02/12/16 2255   02/10/16 1130  meropenem (MERREM) 1 g in sodium chloride 0.9 % 100 mL IVPB     1 g 200 mL/hr over 30 Minutes Intravenous Every 8 hours 02/10/16 1107     02/09/16 1830  vancomycin (VANCOCIN) IVPB 1000 mg/200 mL premix  Status:  Discontinued     1,000 mg 200 mL/hr over 60 Minutes Intravenous Every 12 hours 02/09/16 1759 02/12/16 2139   02/09/16 1800  ceFEPIme (MAXIPIME) 2 g in dextrose 5 % 50 mL IVPB  Status:  Discontinued     2 g 100 mL/hr over 30 Minutes Intravenous Every 8 hours 02/09/16 1757 02/10/16 1050      Subjective: Pain better controlled, still eating nothing. 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   Objective: Vitals:   02/13/16 0532 02/13/16 1027 02/13/16 1032 02/13/16 1500  BP: 138/74   (!) 151/83  Pulse: 75   71  Resp: 16 16 16 16   Temp: 97.8 F (36.6 C)   97.7 F (36.5 C)  TempSrc: Oral   Oral  SpO2: 96% 96% 96% 98%  Weight:      Height:        Intake/Output Summary (Last 24 hours) at 02/13/16 1615 Last data filed at 02/13/16 1500  Gross per 24 hour  Intake             2390 ml  Output             2700 ml  Net             -310 ml   Filed Weights    02/08/16 0640  Weight: 73 kg (161 lb)    Examination:  General exam: Awake but drowsy, in NAD.  Respiratory system: normal chest rise, no audible wheezing Cardiovascular system: regular rate, s1, s2 Gastrointestinal system: soft, nondistended, pos BS Central nervous system: cn2-12 grossly intact, strength intact Extremities: no clubbing, perfused. Poor muscle tone and bulk. Skin: normal skin turgor, no notable skin lesions seen Psychiatry: mood normal// no visual hallucinations  Data Reviewed: I have personally reviewed following labs and imaging studies  CBC:  Recent Labs Lab 02/08/16 0716  02/09/16 0514 02/09/16 1035 02/10/16 0533 02/11/16 0444 02/12/16 0444  WBC 9.4  --  10.1  --  8.6 7.8 7.6  NEUTROABS 8.5*  --   --   --   --   --   --  HGB 8.6*  < > 8.1* 8.1* 7.6* 7.3* 7.8*  HCT 24.6*  < > 23.1* 23.0* 22.1* 20.7* 22.8*  MCV 92.5  --  92.8  --  92.9 89.2 91.6  PLT 277  --  291  --  311 309 347  < > = values in this interval not displayed. Basic Metabolic Panel:  Recent Labs Lab 02/08/16 0716 02/09/16 0514 02/10/16 0533 02/11/16 0444 02/12/16 0444  NA 127* 128* 130* 128* 128*  K 4.4 4.5 3.9 3.2* 3.8  CL 91* 95* 97* 95* 96*  CO2 26 27 26 27 27   GLUCOSE 135* 111* 112* 112* 111*  BUN 25* 18 15 11 11   CREATININE 0.92 0.77 0.90 0.73 0.74  CALCIUM 8.0* 8.0* 7.9* 7.6* 7.5*   GFR: Estimated Creatinine Clearance: 86.7 mL/min (by C-G formula based on SCr of 0.74 mg/dL). Liver Function Tests:  Recent Labs Lab 02/08/16 0716  AST 19  ALT 14*  ALKPHOS 75  BILITOT 0.7  PROT 5.9*  ALBUMIN 2.3*   No results for input(s): LIPASE, AMYLASE in the last 168 hours. No results for input(s): AMMONIA in the last 168 hours. Coagulation Profile: No results for input(s): INR, PROTIME in the last 168 hours. Cardiac Enzymes: No results for input(s): CKTOTAL, CKMB, CKMBINDEX, TROPONINI in the last 168 hours. BNP (last 3 results) No results for input(s): PROBNP in the last  8760 hours. HbA1C: No results for input(s): HGBA1C in the last 72 hours. CBG: No results for input(s): GLUCAP in the last 168 hours. Lipid Profile: No results for input(s): CHOL, HDL, LDLCALC, TRIG, CHOLHDL, LDLDIRECT in the last 72 hours. Thyroid Function Tests: No results for input(s): TSH, T4TOTAL, FREET4, T3FREE, THYROIDAB in the last 72 hours. Anemia Panel: No results for input(s): VITAMINB12, FOLATE, FERRITIN, TIBC, IRON, RETICCTPCT in the last 72 hours. Sepsis Labs:  Recent Labs Lab 02/08/16 0725  LATICACIDVEN 1.51    Recent Results (from the past 240 hour(s))  Urine culture     Status: Abnormal   Collection Time: 02/08/16  7:29 PM  Result Value Ref Range Status   Specimen Description URINE, CLEAN CATCH  Final   Special Requests NONE  Final   Culture 60,000 COLONIES/mL STAPHYLOCOCCUS HAEMOLYTICUS (A)  Final   Report Status 02/11/2016 FINAL  Final   Organism ID, Bacteria STAPHYLOCOCCUS HAEMOLYTICUS (A)  Final      Susceptibility   Staphylococcus haemolyticus - MIC*    CIPROFLOXACIN <=0.5 SENSITIVE Sensitive     GENTAMICIN <=0.5 SENSITIVE Sensitive     NITROFURANTOIN <=16 SENSITIVE Sensitive     OXACILLIN <=0.25 SENSITIVE Sensitive     TETRACYCLINE >=16 RESISTANT Resistant     VANCOMYCIN <=0.5 SENSITIVE Sensitive     TRIMETH/SULFA <=10 SENSITIVE Sensitive     CLINDAMYCIN <=0.25 SENSITIVE Sensitive     RIFAMPIN <=0.5 SENSITIVE Sensitive     Inducible Clindamycin NEGATIVE Sensitive     * 60,000 COLONIES/mL STAPHYLOCOCCUS HAEMOLYTICUS     Radiology Studies: Ct Abdomen Pelvis W Contrast  Result Date: 02/12/2016 CLINICAL DATA:  Known L1 compression deformity with adjacent bony destruction and findings suggestive of localized infection. EXAM: CT ABDOMEN AND PELVIS WITH CONTRAST TECHNIQUE: Multidetector CT imaging of the abdomen and pelvis was performed using the standard protocol following bolus administration of intravenous contrast. CONTRAST:  181mL ISOVUE-300  COMPARISON:  02/08/2016 FINDINGS: Lower chest: Bilateral pleural effusions with bibasilar atelectasis noted. Hepatobiliary: Enhancing nodule is again noted in the medial aspect of the right lobe of the liver stable from the prior  exam. Stable hypodensities are noted in the inferior aspect of the right lobe of the liver adjacent to the prior nephrectomy site. Postsurgical changes in the liver laterally are noted as well. The gallbladder is well distended with multiple gallstones. No obstructive changes are seen. Pancreas: Unremarkable. No pancreatic ductal dilatation or surrounding inflammatory changes. Spleen: Normal in size without focal abnormality. Adrenals/Urinary Tract: The left adrenal gland and left kidney are stable from the prior exam. A large cyst is noted as well as some renal vascular calcifications. No obstructive changes are seen. The right kidney has been surgically removed. The right adrenal gland appears within normal limits. The bladder is well distended with a Foley catheter in place. Air is noted likely related to the Foley placement. Stomach/Bowel: No obstructive changes are seen. There is some significant wall thickening in the ascending colon adjacent to the area of abnormality within and adjacent to the L1 vertebral body. There remains a considerable of mottled air and fluid density within the L1 vertebral body and extending into the spinal canal as well as into the paraspinal tissues on the right. These changes are intimately involved with the wall thickening in the colon and may be related to localized perforation. There is an apparent area of breakdown of the medial wall of the ascending colon best seen on image number 36 of series 2. It Is better delineated on today's exam due to the administration of oral contrast. No obstructive changes are seen. No other focal abnormality in the colon is noted. Vascular/Lymphatic: Aortic atherosclerosis. No enlarged abdominal or pelvic lymph nodes.  Reproductive: Prostate is unremarkable. Other: No abdominal wall hernia or abnormality. No abdominopelvic ascites. Musculoskeletal: There remain changes as described above in the L1 vertebral body. The degree of mottled air extrinsic to the vertebral body particularly anteriorly behind the aorta has increased. Lytic lesion is noted in the right half of the L2 vertebral body consistent with metastatic disease. This also has a small amount of air within but stable from the prior exam. Air is also noted in the L1-2 disc space. IMPRESSION: Persistent changes of L1 compression deformity with superimposed mottled air and fluid consistent with localized infection. There is irregularity of the medial wall of the ascending colon as described suggestive of contained perforation. This would account for the local spread of apparent infection. Lytic lesion within the L2 vertebral body stable from the prior exam. Stable enhancing lesion in the right hepatic lobe consistent with metastatic disease. Areas of decreased attenuation within the tip of the liver adjacent to the area of abnormality in the colon. This may represent some localized inflammatory change as well. Increased bilateral pleural effusions with bibasilar atelectasis Electronically Signed   By: Inez Catalina M.D.   On: 02/12/2016 13:54    Scheduled Meds: . docusate sodium  100 mg Oral BID  . febuxostat  40 mg Oral Daily  . feeding supplement (ENSURE ENLIVE)  237 mL Oral BID BM  . HYDROmorphone   Intravenous Q4H  . LORazepam  0.5-1 mg Oral QHS  . meropenem (MERREM) IV  1 g Intravenous Q8H  . pantoprazole (PROTONIX) IV  40 mg Intravenous Q12H  . vancomycin  750 mg Intravenous Q8H   Continuous Infusions: . sodium chloride 75 mL/hr at 02/13/16 1423     LOS: 5 days   Vance Gather, MD [facility] [phone]  If 7PM-7AM, please contact night-coverage [email or web address] Password [email or web address] 02/13/2016, 4:15 PM

## 2016-02-13 NOTE — Plan of Care (Signed)
Problem: Pain Managment: Goal: General experience of comfort will improve Outcome: Progressing Patient is now on dilaudid PCA, pain level is mostly 5/10

## 2016-02-13 NOTE — Progress Notes (Signed)
Assessment Principal Problem:   Contained perforation of right colon at hepatic flexure-confirmed on CT Active Problems:     S/P CABG x 4   Metastatic renal cell carcinoma (HCC)-aggressive tumor     L1 Metastasis   Protein-calorie malnutrition, severe-prealbumin not measurable.   Neoplasm related pain   Plan: He is not an operative candidate at this time.  If his nutritional status improves significantly and he improve significantly, we would be glad to see him again.    LOS: 5 days        Subjective: Continued back pain.  No abdominal pain.  Not eating much (and has not done so for about 10) days.  Wife in room. Objective: Vital signs in last 24 hours: Temp:  [97.7 F (36.5 C)-98.9 F (37.2 C)] 97.7 F (36.5 C) (12/13 1500) Pulse Rate:  [71-82] 71 (12/13 1500) Resp:  [13-18] 16 (12/13 1600) BP: (137-154)/(69-83) 151/83 (12/13 1500) SpO2:  [94 %-98 %] 97 % (12/13 1600) Last BM Date: 02/08/16 (pt has not taken in po's)  Intake/Output from previous day: 12/12 0701 - 12/13 0700 In: 2270 [P.O.:120; I.V.:1800; IV Piggyback:350] Out: 2000 [Urine:2000] Intake/Output this shift: Total I/O In: 120 [P.O.:120] Out: 1300 [Urine:1300]  PE: General- In NAD Abdomen-soft, not tender  Lab Results:   Recent Labs  02/11/16 0444 02/12/16 0444  WBC 7.8 7.6  HGB 7.3* 7.8*  HCT 20.7* 22.8*  PLT 309 347   BMET  Recent Labs  02/11/16 0444 02/12/16 0444  NA 128* 128*  K 3.2* 3.8  CL 95* 96*  CO2 27 27  GLUCOSE 112* 111*  BUN 11 11  CREATININE 0.73 0.74  CALCIUM 7.6* 7.5*   PT/INR No results for input(s): LABPROT, INR in the last 72 hours. Comprehensive Metabolic Panel:    Component Value Date/Time   NA 128 (L) 02/12/2016 0444   NA 128 (L) 02/11/2016 0444   NA 128 (L) 02/04/2016 1116   NA 140 01/22/2016 0956   K 3.8 02/12/2016 0444   K 3.2 (L) 02/11/2016 0444   K 5.1 02/04/2016 1116   K 5.0 01/22/2016 0956   CL 96 (L) 02/12/2016 0444   CL 95 (L) 02/11/2016  0444   CO2 27 02/12/2016 0444   CO2 27 02/11/2016 0444   CO2 27 02/04/2016 1116   CO2 26 01/22/2016 0956   BUN 11 02/12/2016 0444   BUN 11 02/11/2016 0444   BUN 16.0 02/04/2016 1116   BUN 14.4 01/22/2016 0956   CREATININE 0.74 02/12/2016 0444   CREATININE 0.73 02/11/2016 0444   CREATININE 0.9 02/04/2016 1116   CREATININE 0.9 01/22/2016 0956   GLUCOSE 111 (H) 02/12/2016 0444   GLUCOSE 112 (H) 02/11/2016 0444   GLUCOSE 119 02/04/2016 1116   GLUCOSE 97 01/22/2016 0956   CALCIUM 7.5 (L) 02/12/2016 0444   CALCIUM 7.6 (L) 02/11/2016 0444   CALCIUM 9.8 02/04/2016 1116   CALCIUM 9.7 01/22/2016 0956   AST 19 02/08/2016 0716   AST 17 02/04/2016 1116   AST 21 01/22/2016 0956   ALT 14 (L) 02/08/2016 0716   ALT 13 02/04/2016 1116   ALT 22 01/22/2016 0956   ALKPHOS 75 02/08/2016 0716   ALKPHOS 88 02/04/2016 1116   ALKPHOS 75 01/22/2016 0956   BILITOT 0.7 02/08/2016 0716   BILITOT 0.65 02/04/2016 1116   BILITOT 0.44 01/22/2016 0956   PROT 5.9 (L) 02/08/2016 0716   PROT 7.4 02/04/2016 1116   PROT 7.2 01/22/2016 0956   ALBUMIN 2.3 (L) 02/08/2016  [id number]   ALBUMIN 2.5 (L) 02/04/2016 1116   ALBUMIN 2.9 (L) 01/22/2016 0956     Studies/Results: Ct Abdomen Pelvis W Contrast  Result Date: 02/12/2016 CLINICAL DATA:  Known L1 compression deformity with adjacent bony destruction and findings suggestive of localized infection. EXAM: CT ABDOMEN AND PELVIS WITH CONTRAST TECHNIQUE: Multidetector CT imaging of the abdomen and pelvis was performed using the standard protocol following bolus administration of intravenous contrast. CONTRAST:  124mL ISOVUE-300 COMPARISON:  02/08/2016 FINDINGS: Lower chest: Bilateral pleural effusions with bibasilar atelectasis noted. Hepatobiliary: Enhancing nodule is again noted in the medial aspect of the right lobe of the liver stable from the prior exam. Stable hypodensities are noted in the inferior aspect of the right lobe of the liver adjacent to the prior nephrectomy  site. Postsurgical changes in the liver laterally are noted as well. The gallbladder is well distended with multiple gallstones. No obstructive changes are seen. Pancreas: Unremarkable. No pancreatic ductal dilatation or surrounding inflammatory changes. Spleen: Normal in size without focal abnormality. Adrenals/Urinary Tract: The left adrenal gland and left kidney are stable from the prior exam. A large cyst is noted as well as some renal vascular calcifications. No obstructive changes are seen. The right kidney has been surgically removed. The right adrenal gland appears within normal limits. The bladder is well distended with a Foley catheter in place. Air is noted likely related to the Foley placement. Stomach/Bowel: No obstructive changes are seen. There is some significant wall thickening in the ascending colon adjacent to the area of abnormality within and adjacent to the L1 vertebral body. There remains a considerable of mottled air and fluid density within the L1 vertebral body and extending into the spinal canal as well as into the paraspinal tissues on the right. These changes are intimately involved with the wall thickening in the colon and may be related to localized perforation. There is an apparent area of breakdown of the medial wall of the ascending colon best seen on image number 36 of series 2. It Is better delineated on today's exam due to the administration of oral contrast. No obstructive changes are seen. No other focal abnormality in the colon is noted. Vascular/Lymphatic: Aortic atherosclerosis. No enlarged abdominal or pelvic lymph nodes. Reproductive: Prostate is unremarkable. Other: No abdominal wall hernia or abnormality. No abdominopelvic ascites. Musculoskeletal: There remain changes as described above in the L1 vertebral body. The degree of mottled air extrinsic to the vertebral body particularly anteriorly behind the aorta has increased. Lytic lesion is noted in the right half of the  L2 vertebral body consistent with metastatic disease. This also has a small amount of air within but stable from the prior exam. Air is also noted in the L1-2 disc space. IMPRESSION: Persistent changes of L1 compression deformity with superimposed mottled air and fluid consistent with localized infection. There is irregularity of the medial wall of the ascending colon as described suggestive of contained perforation. This would account for the local spread of apparent infection. Lytic lesion within the L2 vertebral body stable from the prior exam. Stable enhancing lesion in the right hepatic lobe consistent with metastatic disease. Areas of decreased attenuation within the tip of the liver adjacent to the area of abnormality in the colon. This may represent some localized inflammatory change as well. Increased bilateral pleural effusions with bibasilar atelectasis Electronically Signed   By: Inez Catalina M.D.   On: 02/12/2016 13:54    Anti-infectives: Anti-infectives    Start     Dose/Rate  Route Frequency Ordered Stop   02/13/16 0600  vancomycin (VANCOCIN) IVPB 750 mg/150 ml premix     750 mg 150 mL/hr over 60 Minutes Intravenous Every 8 hours 02/12/16 2140     02/12/16 2145  vancomycin (VANCOCIN) IVPB 1000 mg/200 mL premix     1,000 mg 200 mL/hr over 60 Minutes Intravenous STAT 02/12/16 2140 02/12/16 2255   02/10/16 1130  meropenem (MERREM) 1 g in sodium chloride 0.9 % 100 mL IVPB     1 g 200 mL/hr over 30 Minutes Intravenous Every 8 hours 02/10/16 1107     02/09/16 1830  vancomycin (VANCOCIN) IVPB 1000 mg/200 mL premix  Status:  Discontinued     1,000 mg 200 mL/hr over 60 Minutes Intravenous Every 12 hours 02/09/16 1759 02/12/16 2139   02/09/16 1800  ceFEPIme (MAXIPIME) 2 g in dextrose 5 % 50 mL IVPB  Status:  Discontinued     2 g 100 mL/hr over 30 Minutes Intravenous Every 8 hours 02/09/16 1757 02/10/16 1050       Christine Morton J 02/13/2016

## 2016-02-13 NOTE — Progress Notes (Signed)
IP PROGRESS NOTE  Subjective:   No changes noted clinically. Continues to be minimally responsive on Dilaudid PCA. He does report some occasional spasms. limited by mouth intake.  Objective:  Vital signs in last 24 hours: Temp:  [97.8 F (36.6 C)-98.9 F (37.2 C)] 97.8 F (36.6 C) (12/13 0532) Pulse Rate:  [74-82] 75 (12/13 0532) Resp:  [13-18] 16 (12/13 0532) BP: (137-161)/(69-94) 138/74 (12/13 0532) SpO2:  [94 %-96 %] 96 % (12/13 0532) Weight change:  Last BM Date: 02/08/16  Intake/Output from previous day: 12/12 0701 - 12/13 0700 In: 2270 [P.O.:120; I.V.:1800; IV Piggyback:350] Out: 2000 [Urine:2000] Lethargic but arousable. Did not appear in any distress. Mouth: Dry mucous membranes with oral ulcers noted. Resp: clear to auscultation bilaterally Cardio: regular rate and rhythm, S1, S2 normal, no murmur, click, rub or gallop GI: soft, non-tender; bowel sounds normal; no masses,  no organomegaly Extremities: extremities normal, atraumatic, no cyanosis or edema    Lab Results:  Recent Labs  02/11/16 0444 02/12/16 0444  WBC 7.8 7.6  HGB 7.3* 7.8*  HCT 20.7* 22.8*  PLT 309 347    BMET  Recent Labs  02/11/16 0444 02/12/16 0444  NA 128* 128*  K 3.2* 3.8  CL 95* 96*  CO2 27 27  GLUCOSE 112* 111*  BUN 11 11  CREATININE 0.73 0.74  CALCIUM 7.6* 7.5*    Studies/Results: Ct Abdomen Pelvis W Contrast  Result Date: 02/12/2016 CLINICAL DATA:  Known L1 compression deformity with adjacent bony destruction and findings suggestive of localized infection. EXAM: CT ABDOMEN AND PELVIS WITH CONTRAST TECHNIQUE: Multidetector CT imaging of the abdomen and pelvis was performed using the standard protocol following bolus administration of intravenous contrast. CONTRAST:  159mL ISOVUE-300 COMPARISON:  02/08/2016 FINDINGS: Lower chest: Bilateral pleural effusions with bibasilar atelectasis noted. Hepatobiliary: Enhancing nodule is again noted in the medial aspect of the right  lobe of the liver stable from the prior exam. Stable hypodensities are noted in the inferior aspect of the right lobe of the liver adjacent to the prior nephrectomy site. Postsurgical changes in the liver laterally are noted as well. The gallbladder is well distended with multiple gallstones. No obstructive changes are seen. Pancreas: Unremarkable. No pancreatic ductal dilatation or surrounding inflammatory changes. Spleen: Normal in size without focal abnormality. Adrenals/Urinary Tract: The left adrenal gland and left kidney are stable from the prior exam. A large cyst is noted as well as some renal vascular calcifications. No obstructive changes are seen. The right kidney has been surgically removed. The right adrenal gland appears within normal limits. The bladder is well distended with a Foley catheter in place. Air is noted likely related to the Foley placement. Stomach/Bowel: No obstructive changes are seen. There is some significant wall thickening in the ascending colon adjacent to the area of abnormality within and adjacent to the L1 vertebral body. There remains a considerable of mottled air and fluid density within the L1 vertebral body and extending into the spinal canal as well as into the paraspinal tissues on the right. These changes are intimately involved with the wall thickening in the colon and may be related to localized perforation. There is an apparent area of breakdown of the medial wall of the ascending colon best seen on image number 36 of series 2. It Is better delineated on today's exam due to the administration of oral contrast. No obstructive changes are seen. No other focal abnormality in the colon is noted. Vascular/Lymphatic: Aortic atherosclerosis. No enlarged abdominal or pelvic  lymph nodes. Reproductive: Prostate is unremarkable. Other: No abdominal wall hernia or abnormality. No abdominopelvic ascites. Musculoskeletal: There remain changes as described above in the L1 vertebral  body. The degree of mottled air extrinsic to the vertebral body particularly anteriorly behind the aorta has increased. Lytic lesion is noted in the right half of the L2 vertebral body consistent with metastatic disease. This also has a small amount of air within but stable from the prior exam. Air is also noted in the L1-2 disc space. IMPRESSION: Persistent changes of L1 compression deformity with superimposed mottled air and fluid consistent with localized infection. There is irregularity of the medial wall of the ascending colon as described suggestive of contained perforation. This would account for the local spread of apparent infection. Lytic lesion within the L2 vertebral body stable from the prior exam. Stable enhancing lesion in the right hepatic lobe consistent with metastatic disease. Areas of decreased attenuation within the tip of the liver adjacent to the area of abnormality in the colon. This may represent some localized inflammatory change as well. Increased bilateral pleural effusions with bibasilar atelectasis Electronically Signed   By: Inez Catalina M.D.   On: 02/12/2016 13:54    Medications: I have reviewed the patient's current medications.  Assessment/Plan:  65 year old with the following issues:  1. Metastatic renal cell carcinoma: He is currently on salvage therapy utilizing Cabometyx which has been withheld at this time.   I agree with holding treatment at this time given the acute illness he is experiencing..  2. 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 Input from infectious disease, neurosurgery and Gen. surgery is appreciated. It is a certainly complex situation and I defer the management of this to the services.  CT scan on 02/12/2016 was reviewed. His case was discussed with Dr. Zella Richer as well as with the wife and his son.  I explained the difficult situation that he is in at this time given his  severe pain, microperforation and advanced malignancy. His options would include high risk surgery because of his poor nutrition and poor performance status and attempt to undergo vertebroplasty he will require a hemicolectomy. This will require lengthy recovery and probably withholding his anticancer medication. In the setting of an advanced, incurable malignancy that is rapidly advancing his outcome is likely unfavorable. I see a limited chance of recovery from this aggressive approach.  Alternatively, transitioning to comfort care with focus on pain management and possibly residential hospice would be the other end of the spectrum in this case. This would be reasonable in this setting given his advanced malignancy.  The family understands that this is a difficult decision and they are struggling with it at this time. I will continue to offer my council and advice to them as long as needed.   3. Pain: Controlled at this time with Dilaudid PCA. Appreciate input from palliative medicine services.   4. Prognosis: This was addressed today again in detail. He has an incurable malignancy with limited life expectancy (months at most ) even before this current complication. With his recent hospitalization, malnourishment and possible microperforation his prognosis is worse. We are looking at limited life expectancy regardless of what approach they take at this time.     LOS: 5 days   Oluwafemi Villella 02/13/2016, 8:41 AM

## 2016-02-13 NOTE — Progress Notes (Signed)
    [facility] for Infectious Disease   Reason for visit: Follow up on possible abscess collection  Interval History: no fever  Physical Exam: Constitutional:  Vitals:   02/13/16 0532 02/13/16 1027  BP: 138/74   Pulse: 75   Resp: 16 16  Temp: 97.8 F (36.6 C)     Impression/plan: difficult options for the patient.  Surgery not a great option, prognosis poor either way.   I don't expect antibiotics to cure this.  If surgery pursued, would take care of infection but not likely a viable option at this point.  Could use levaquin/flagyl at discharge for 7-10 days and stop, though again would not likely get rid of it.   They were not at the time ready to engage with Dr.Freeman but may be now with current situation.   At this time, I will sign off but available if needed.  thanks

## 2016-02-14 LAB — COMPREHENSIVE METABOLIC PANEL
ALBUMIN: 1.9 g/dL — AB (ref 3.5–5.0)
ALT: 23 U/L (ref 17–63)
ANION GAP: 5 (ref 5–15)
AST: 26 U/L (ref 15–41)
Alkaline Phosphatase: 66 U/L (ref 38–126)
BUN: 10 mg/dL (ref 6–20)
CHLORIDE: 93 mmol/L — AB (ref 101–111)
CO2: 29 mmol/L (ref 22–32)
Calcium: 7.6 mg/dL — ABNORMAL LOW (ref 8.9–10.3)
Creatinine, Ser: 0.68 mg/dL (ref 0.61–1.24)
GFR calc Af Amer: >60 mL/min (ref >60)
GFR calc non Af Amer: >60 mL/min (ref >60)
GLUCOSE: 121 mg/dL — AB (ref 65–99)
Potassium: 3.7 mmol/L (ref 3.5–5.1)
SODIUM: 127 mmol/L — AB (ref 135–145)
Total Bilirubin: 0.7 mg/dL (ref 0.3–1.2)
Total Protein: 5.2 g/dL — ABNORMAL LOW (ref 6.5–8.1)

## 2016-02-14 LAB — CBC
HCT: 24.3 % — ABNORMAL LOW (ref 39.0–52.0)
HEMOGLOBIN: 8.4 g/dL — AB (ref 13.0–17.0)
MCH: 31.9 pg (ref 26.0–34.0)
MCHC: 34.6 g/dL (ref 30.0–36.0)
MCV: 92.4 fL (ref 78.0–100.0)
Platelets: 391 10*3/uL (ref 150–400)
RBC: 2.63 MIL/uL — AB (ref 4.22–5.81)
RDW: 14.2 % (ref 11.5–15.5)
WBC: 6.8 10*3/uL (ref 4.0–10.5)

## 2016-02-14 MED ORDER — VANCOMYCIN HCL IN DEXTROSE 750-5 MG/150ML-% IV SOLN
750.0000 mg | Freq: Three times a day (TID) | INTRAVENOUS | Status: DC
  Administered 2016-02-14 – 2016-02-16 (×5): 750 mg via INTRAVENOUS
  Filled 2016-02-14 (×6): qty 150

## 2016-02-14 MED ORDER — ENSURE ENLIVE PO LIQD: 237.0000 mL | Freq: Two times a day (BID) | ORAL | Status: DC | PRN

## 2016-02-14 MED ORDER — MEROPENEM 1 G IV SOLR
1.0000 g | Freq: Three times a day (TID) | INTRAVENOUS | Status: DC
  Administered 2016-02-14: 1 g via INTRAVENOUS
  Filled 2016-02-14 (×2): qty 1

## 2016-02-14 MED ORDER — SODIUM CHLORIDE 0.9 % IV SOLN
INTRAVENOUS | Status: DC
  Administered 2016-02-14 – 2016-02-16 (×2): via INTRAVENOUS

## 2016-02-14 MED ORDER — HYDROMORPHONE 1 MG/ML IV SOLN
INTRAVENOUS | Status: DC
  Administered 2016-02-14: 3.91 mg via INTRAVENOUS
  Administered 2016-02-15: 1.8 mg via INTRAVENOUS
  Administered 2016-02-15: 3.23 mg via INTRAVENOUS
  Administered 2016-02-15: 3.5 mg via INTRAVENOUS
  Administered 2016-02-15: 0.7 mg via INTRAVENOUS
  Administered 2016-02-15: 1.3 mg via INTRAVENOUS
  Administered 2016-02-15: 3 mg via INTRAVENOUS
  Administered 2016-02-16: 0 mg via INTRAVENOUS
  Administered 2016-02-16: 1 mg via INTRAVENOUS
  Administered 2016-02-16: 0.56 mg via INTRAVENOUS
  Administered 2016-02-16: 3 mg via INTRAVENOUS
  Filled 2016-02-14: qty 25

## 2016-02-14 MED ORDER — SODIUM CHLORIDE 0.9 % IV SOLN
1.0000 g | Freq: Three times a day (TID) | INTRAVENOUS | Status: DC
  Administered 2016-02-15 – 2016-02-16 (×5): 1 g via INTRAVENOUS
  Filled 2016-02-14 (×5): qty 1

## 2016-02-14 NOTE — Progress Notes (Signed)
Daily Progress Note   Patient Name: Derek [name]       Date: 02/14/2016 DOB: 07/04/1950  Age: 65 y.o. MRN#: [id number] Attending Physician: Patrecia Pour, MD Primary Care Physician: Elise Benne Admit Date: 02/08/2016  Reason for Consultation/Follow-up: Establishing goals of care and Pain control  Subjective: Patient is comfortable on current PCA settings.  Wife at bedside, see discussions below  Length of Stay: 6  Current Medications: Scheduled Meds:  . docusate sodium  100 mg Oral BID  . febuxostat  40 mg Oral Daily  . feeding supplement (ENSURE ENLIVE)  237 mL Oral BID BM  . HYDROmorphone   Intravenous Q4H  . LORazepam  0.5-1 mg Oral QHS  . meropenem (MERREM) IV  1 g Intravenous Q8H  . pantoprazole (PROTONIX) IV  40 mg Intravenous Q12H  . vancomycin  750 mg Intravenous Q8H    Continuous Infusions: . sodium chloride 75 mL/hr at 02/14/16 0552    PRN Meds: acetaminophen **OR** acetaminophen, diphenhydrAMINE **OR** diphenhydrAMINE, diphenoxylate-atropine, magic mouthwash, naloxone **AND** sodium chloride flush, ondansetron **OR** ondansetron (ZOFRAN) IV  Physical Exam      General: Alert, awake, in no acute distress. Resting with eyes closed but responds appropriately to questions.  Chronically ill appearing. HEENT: No bruits, no goiter, no JVD Heart: Regular rate and rhythm. No murmur appreciated. Lungs: Fair air movement, clear Abdomen: Soft, globally tender, nondistended, positive bowel sounds.  Ext: +LE edema Skin: Warm and dry, pallor Neuro: Grossly intact, nonfocal.     Vital Signs: BP (!) 163/88 (BP Location: Right Arm)   Pulse 80   Temp 98.5 F (36.9 C) (Oral)   Resp 16   Ht 5\' 8"  (1.727 m)   Wt 73 kg (161 lb)   SpO2 100%   BMI 24.48 kg/m  SpO2:  SpO2: 100 % O2 Device: O2 Device: Nasal Cannula O2 Flow Rate: O2 Flow Rate (L/min): 3 L/min  Intake/output summary:   Intake/Output Summary (Last 24 hours) at 02/14/16 0838 Last data filed at 02/14/16 0826  Gross per 24 hour  Intake             2730 ml  Output             2775 ml  Net              -  45 ml   LBM: Last BM Date: 02/08/16 Baseline Weight: Weight: 73 kg (161 lb) Most recent weight: Weight: 73 kg (161 lb)       Palliative Assessment/Data:    Flowsheet Rows   Flowsheet Row Most Recent Value  Intake Tab  Referral Department  Hospitalist  Unit at Time of Referral  Med/Surg Unit  Palliative Care Primary Diagnosis  Cancer  Date Notified  02/09/16  Palliative Care Type  New Palliative care  Reason for referral  Clarify Goals of Care  Date of Admission  02/08/16  Date first seen by Palliative Care  02/09/16  # of days Palliative referral response time  0 Day(s)  # of days IP prior to Palliative referral  1  Clinical Assessment  Palliative Performance Scale Score  40%  Pain Max last 24 hours  8  Pain Min Last 24 hours  0  Psychosocial & Spiritual Assessment  Palliative Care Outcomes  Patient/Family meeting held?  Yes  Who was at the meeting?  Patient, wife, 2 sons, dtr in Sports coach, family friend  Palliative Care Outcomes  Clarified goals of care      Patient Active Problem List   Diagnosis Date Noted  . Retroperitoneal abscess ([place])   . Colon perforation ([place])   . Protein-calorie malnutrition, severe 02/12/2016  . Palliative care encounter   . Neoplasm related pain   . Intraabdominal fluid collection   . Coronary artery disease 02/10/2016  . Unintentional weight loss 02/10/2016  . Protein-calorie malnutrition ([place]) 02/10/2016  . Retroperitoneal air 02/10/2016  . Acute lower GI bleeding 02/08/2016  . Acute GI bleeding 02/08/2016  . Diarrhea 10/29/2015  . Dehydration 10/29/2015  . 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 Medication management 09/21/2015  .  Hyperlipidemia 12/11/2014  . Normocytic anemia 12/02/2014  . Metastatic renal cell carcinoma ([place]) 11/28/2014  . Gout 02/17/2014  . 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 HTN (hypertension) 01/23/2014  . Obesity 01/23/2014  . Prediabetes 01/23/2014  . Systolic and diastolic CHF, acute ([place]) 01/21/2014  . NSTEMI (non-ST elevated myocardial infarction) ([place]) 01/21/2014  . LVH (left ventricular hypertrophy) due to hypertensive disease 01/13/2014    Palliative Care Assessment & Plan   Patient Profile: 65 y.o. male  with past medical history of stage IV renal cancer s/p R nephrectomy, radiation, and chemo.  He has known history of L1 mets.  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  Palliative consulted for goals of care.  Recommendations/Plan: Pain: continue current PCA settings.   Goals of care discussions: discussed with wife at the bedside this am. She is asking why the NGT has not been placed yet, she has worked diligently on getting the patient to eat more. She states her goal is to improve the patient's nutritional status so he can get better, be considered for surgery etc. Gently discussed with her about cancer related cachexia, patient's overall condition. She does not wish to discuss further, is focused on making sure the NGT gets placed today. We will continue to work with patient and family regarding ongoing goals of care. Agree that patient would benefit most from comfort measures and hospice type care, how ever, family does not wish to pursue this at present.   Goals of Care and Additional Recommendations:  Limitations on Scope of Treatment: Full Scope Treatment  Code Status:  Code Status Orders        Start     Ordered   02/08/16 1044  Full code  Continuous     02/08/16 1043    Code  Status History    Date Active Date Inactive Code Status Order ID Comments User Context   11/28/2014  2:41 PM 12/02/2014  4:25 PM Full Code [id number]  Cleon Gustin, MD Inpatient   01/27/2014  1:17 PM 01/29/2014 12:39 PM Full Code [id number]  John Giovanni, PA-C Inpatient   01/23/2014  5:51 PM 01/27/2014  1:17 PM Full Code [id number]  Sherren Mocha, MD Inpatient   01/21/2014 11:41 AM 01/23/2014  5:51 PM Full Code [id number]  Lonn Georgia, PA-C Inpatient    Advance Directive Documentation   Flowsheet Row Most Recent Value  Type of Advance Directive  Healthcare Power of Attorney  Pre-existing out of facility DNR order (yellow form or pink MOST form)  No data  "MOST" Form in Place?  No data       Prognosis:   guarded.   Discharge Planning:  To Be Determined  Care plan was discussed with patient, wife  Thank you for allowing the Palliative Medicine Team to assist in the care of this patient.   Time In: 8 Time Out: 8.25 Total Time 25 Prolonged Time Billed No      Greater than 50%  of this time was spent counseling and coordinating care related to the above assessment and plan.  Loistine Chance, MD [phone]  Please contact Palliative Medicine Team phone at [phone] for questions and concerns.

## 2016-02-14 NOTE — Progress Notes (Signed)
Call received from [facility]. Wanting to have a conference call with Dr. Bonner Puna. Was unable to reach him. [facility] will call again tomorrow morning to have a conference call with Dr. Bonner Puna and possible transfer to [facility]. Wife made aware.

## 2016-02-14 NOTE — Progress Notes (Signed)
Spoke with pt and wife this AM concerning disposition home with Hospice.  [facility] was selected related to location and the pt and wife had no preference. Later this afternoon  Pt's family came, upset in the hallway, this CM asked family to go to conference room. Family is now wanting the pt to transfer to another hospital. Dr.Grunz was called to come see family.  Dr. Auburn Bilberry was very pleasant, took time to explain pt's condition with family and for pt to ask questions. Family asked again that pt be transferred to [facility] and pt agreed.

## 2016-02-14 NOTE — Progress Notes (Signed)
Explained importance of turning patient from side to side and to keep heels elevated to prevent skin breakdown.  Patient and wife refused stating it caused too much pain to move.

## 2016-02-14 NOTE — Progress Notes (Addendum)
Patient family upset when heart monitor was taken off patient and states there is a lack of communication between staff.  RN explained that there was an order to d/c telemetry.  When Rn offered to put patient back on the heart monitor, the patient refused and shook his head that he did not want it back on.  Family stating that they feel concerned the MD is not communicating with other staff members.   I have witnessed Dr. Bonner Puna speak multiple times with family, spending a large amount of time with them explaining the patient conditions and giving time for family to ask questions. Dr. Bonner Puna has also updated me several times about the patient care plan and any changes that have been made.  I have also updated family multiple times with any new information.

## 2016-02-14 NOTE — Progress Notes (Addendum)
PROGRESS NOTE  Derek [name]  RD:[id number] DOB: [birth year] DOA: 02/08/2016 PCP: Mackie Pai, PA-C   Brief Narrative:  Derek [name] is a 64 year old male with metastatic renal cell carcinoma s/p right nephrectomy, XRT and active chemo, CAD s/p CABGx4, recent L1 pathological compression fracture with acute back pain who presented to ED with rectal bleeding and intractable lower back pain due to L1 compression fracture. He had been taking dilaudid and long-acting morphine with little improvement, mostly bedbound in the last 1 week. On the morning of admission he woke up around 4:30am with bright red blood in the stool, subsequently had 2 more episodes. Per patient's son, he had 2 syncopal episodes, has been feeling dizzy and lightheaded since then. 10 days PTA he was ambulating with a cane and had gone on a cruise.   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 Neurosurgery, General Surgery, ID, and IR were consulted. General surgery has signed off stating he is not an operative candidate. Palliative care was consulted and is assisting with pain management with dilaudid PCA. After prolonged discussion of options available to them, the family and patient expressed desire to go home with hospice this morning.   ADDENDUM: Another son came to the hospital following these discussions and requests transfer to [facility] for second opinion. I have placed a call to the referral center there and will discuss the case with general surgery on-call.  Assessment & Plan:   Principal Problem:   Retroperitoneal air Active Problems:   HTN (hypertension)   Prediabetes   S/P CABG x 4   Metastatic renal cell carcinoma (HCC)   Normocytic anemia   Hyperlipidemia   L1 Metastasis   Dehydration   Acute lower GI bleeding   Acute GI bleeding   Coronary artery disease  Unintentional weight loss   Protein-calorie malnutrition ([place])   Intraabdominal fluid collection   Palliative care encounter   Neoplasm related pain   Protein-calorie malnutrition, severe   Retroperitoneal abscess (HCC)   Colon perforation (HCC)  Contained perforation of right colon at hepatic flexure with adjacent fistula/abscess: Air and stool seen in vertebrae on CT at admission  - Not a surgical candidate at this time. Surgery signed off. - Continuing vancomycin and meropenem (received cefepime 12/9) per ID recommendations. ID doesn't expect antibiotics to cure this.   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  Severe protein-calorie malnutrition: Prealbumin < 5 and poor po x several weeks.  - Discussed with family that this is consistent with process of dying.   Acute lower GI bleeding: Resolved, ?constipation-induced hemorrhoids vs. due to perforation. Hgb low but stable in 7's. GI initially consulted. Presently not candidate for endoscopy secondary to below issues, signed off.   Hypotension with underlying history of HTN: BP currently remains stable - Holding coreg, lisinopril.  Metastatic renal cell carcinoma (HCC) with L1 metastatic disease, pathologic compression fractures of L1 and L2, intractable back pain: Patient is s/p nephrectomy in 2016 and follows Dr. Alen Blew, recently undergoing chemo and plan for kyphoplasty as outpatient prior to admission.  - Appreciate Dr. Hazeline Junker assistance. - Appreciate input by General Surgery, Neurosurgery, ID, and IR.  -  Appreciate assistance by Palliative Care for pain management: Will inform decisions Re: comfort medications.   CAD s/p CABG: No indication of ACS. This is one of several comorbidities putting him  at serious operative risk.  - Held ASA in anticipation of surgery and due to NPO.   Hyponatremia with dehydration: Improved but not resolved. Will DC IV fluids and BMP checks if transitions to comfort care.  Bladder outlet obstruction - Continue foley cath given severity of patient's condition  DVT prophylaxis: SCD's Code Status: Full Family Communication: Discussed at length (> 81min) with patient, sons, DIL's, wife Disposition Plan: Home with hospice (ADDENDUM: will discuss case with general surgery at [facility] for 2nd opinion per family request)  Consultants:   Neurosurgery, Dr. Arnoldo Morale  ID, Dr. Linus Salmons  IR, Dr. Pascal Lux  General surgery, Dr. Zella Richer  Palliative care, Dr. Rowe Pavy  Procedures:   None  Antimicrobials: Anti-infectives    Start     Dose/Rate Route Frequency Ordered Stop   02/13/16 0600  vancomycin (VANCOCIN) IVPB 750 mg/150 ml premix  Status:  Discontinued     750 mg 150 mL/hr over 60 Minutes Intravenous Every 8 hours 02/12/16 2140 02/14/16 1315   02/12/16 2145  vancomycin (VANCOCIN) IVPB 1000 mg/200 mL premix     1,000 mg 200 mL/hr over 60 Minutes Intravenous STAT 02/12/16 2140 02/12/16 2255   02/10/16 1130  meropenem (MERREM) 1 g in sodium chloride 0.9 % 100 mL IVPB  Status:  Discontinued     1 g 200 mL/hr over 30 Minutes Intravenous Every 8 hours 02/10/16 1107 02/14/16 1315   02/09/16 1830  vancomycin (VANCOCIN) IVPB 1000 mg/200 mL premix  Status:  Discontinued     1,000 mg 200 mL/hr over 60 Minutes Intravenous Every 12 hours 02/09/16 1759 02/12/16 2139   02/09/16 1800  ceFEPIme (MAXIPIME) 2 g in dextrose 5 % 50 mL IVPB  Status:  Discontinued     2 g 100 mL/hr over 30 Minutes Intravenous Every 8 hours 02/09/16 1757 02/10/16 1050      Subjective: Pain better controlled, ate a bit more, but still not much. [name] (former dentist), while we were discussing his case nodded in assent to mention of comfort measures, when asked if he wants to go home with  hospice and stop IV therapy and NG or TPN feeding, he gives a nod and thumbs up.   Objective: Vitals:   02/14/16 0400 02/14/16 0402 02/14/16 0809 02/14/16 1053  BP:  (!) 163/88  136/75  Pulse:  80  69  Resp: 13 19 16 16   Temp:  98.5 F (36.9 C)    TempSrc:  Oral  Oral  SpO2: 99% 99% 100% 100%  Weight:      Height:        Intake/Output Summary (Last 24 hours) at 02/14/16 1316 Last data filed at 02/14/16 0945  Gross per 24 hour  Intake             2310 ml  Output             2575 ml  Net             -265 ml   Filed Weights   02/08/16 0640  Weight: 73 kg (161 lb)    Examination: General exam: Awake but drowsy, in NAD.  Respiratory system: Nonlabored, 100% on 2L by [place], ETCO2 in mid 30's, no audible wheezing Cardiovascular system: regular rate, s1, s2. Midline sternal incision scar.  Gastrointestinal system: soft, nondistended, pos  BS, RUQ transverse incision scar. GU: + foley with light yellow urine in bag.  Central nervous system: CN II-XII grossly intact, diffusely weak without focal deficits. Extremities: No deformities. Poor muscle tone and bulk. Skin: normal skin turgor, no notable skin lesions seen Psychiatry: Appropriately responsive but very slowly.  Data Reviewed: I have personally reviewed following labs and imaging studies  CBC:  Recent Labs Lab 02/08/16 0716  02/09/16 0514 02/09/16 1035 02/10/16 0533 02/11/16 0444 02/12/16 0444 02/14/16 0503  WBC 9.4  --  10.1  --  8.6 7.8 7.6 6.8  NEUTROABS 8.5*  --   --   --   --   --   --   --   HGB 8.6*  < > 8.1* 8.1* 7.6* 7.3* 7.8* 8.4*  HCT 24.6*  < > 23.1* 23.0* 22.1* 20.7* 22.8* 24.3*  MCV 92.5  --  92.8  --  92.9 89.2 91.6 92.4  PLT 277  --  291  --  311 309 347 391  < > = values in this interval not displayed. Basic Metabolic Panel:  Recent Labs Lab 02/09/16 0514 02/10/16 0533 02/11/16 0444 02/12/16 0444 02/14/16 0503  NA 128* 130* 128* 128* 127*  K 4.5 3.9 3.2* 3.8 3.7  CL 95* 97* 95* 96* 93*    CO2 27 26 27 27 29   GLUCOSE 111* 112* 112* 111* 121*  BUN 18 15 11 11 10   CREATININE 0.77 0.90 0.73 0.74 0.68  CALCIUM 8.0* 7.9* 7.6* 7.5* 7.6*   GFR: Estimated Creatinine Clearance: 86.7 mL/min (by C-G formula based on SCr of 0.68 mg/dL). Liver Function Tests:  Recent Labs Lab 02/08/16 0716 02/14/16 0503  AST 19 26  ALT 14* 23  ALKPHOS 75 66  BILITOT 0.7 0.7  PROT 5.9* 5.2*  ALBUMIN 2.3* 1.9*   Sepsis Labs:  Recent Labs Lab 02/08/16 0725  LATICACIDVEN 1.51   Recent Results (from the past 240 hour(s))  Urine culture     Status: Abnormal   Collection Time: 02/08/16  7:29 PM  Result Value Ref Range Status   Specimen Description URINE, CLEAN CATCH  Final   Special Requests NONE  Final   Culture 60,000 COLONIES/mL STAPHYLOCOCCUS HAEMOLYTICUS (A)  Final   Report Status 02/11/2016 FINAL  Final   Organism ID, Bacteria STAPHYLOCOCCUS HAEMOLYTICUS (A)  Final      Susceptibility   Staphylococcus haemolyticus - MIC*    CIPROFLOXACIN <=0.5 SENSITIVE Sensitive     GENTAMICIN <=0.5 SENSITIVE Sensitive     NITROFURANTOIN <=16 SENSITIVE Sensitive     OXACILLIN <=0.25 SENSITIVE Sensitive     TETRACYCLINE >=16 RESISTANT Resistant     VANCOMYCIN <=0.5 SENSITIVE Sensitive     TRIMETH/SULFA <=10 SENSITIVE Sensitive     CLINDAMYCIN <=0.25 SENSITIVE Sensitive     RIFAMPIN <=0.5 SENSITIVE Sensitive     Inducible Clindamycin NEGATIVE Sensitive     * 60,000 COLONIES/mL STAPHYLOCOCCUS HAEMOLYTICUS    Radiology Studies: Ct Abdomen Pelvis W Contrast  Result Date: 02/12/2016 CLINICAL DATA:  Known L1 compression deformity with adjacent bony destruction and findings suggestive of localized infection. EXAM: CT ABDOMEN AND PELVIS WITH CONTRAST TECHNIQUE: Multidetector CT imaging of the abdomen and pelvis was performed using the standard protocol following bolus administration of intravenous contrast. CONTRAST:  144mL ISOVUE-300 COMPARISON:  02/08/2016 FINDINGS: Lower chest: Bilateral pleural  effusions with bibasilar atelectasis noted. Hepatobiliary: Enhancing nodule is again noted in the medial aspect of the right lobe of the liver stable from the prior exam. Stable hypodensities  are noted in the inferior aspect of the right lobe of the liver adjacent to the prior nephrectomy site. Postsurgical changes in the liver laterally are noted as well. The gallbladder is well distended with multiple gallstones. No obstructive changes are seen. Pancreas: Unremarkable. No pancreatic ductal dilatation or surrounding inflammatory changes. Spleen: Normal in size without focal abnormality. Adrenals/Urinary Tract: The left adrenal gland and left kidney are stable from the prior exam. A large cyst is noted as well as some renal vascular calcifications. No obstructive changes are seen. The right kidney has been surgically removed. The right adrenal gland appears within normal limits. The bladder is well distended with a Foley catheter in place. Air is noted likely related to the Foley placement. Stomach/Bowel: No obstructive changes are seen. There is some significant wall thickening in the ascending colon adjacent to the area of abnormality within and adjacent to the L1 vertebral body. There remains a considerable of mottled air and fluid density within the L1 vertebral body and extending into the spinal canal as well as into the paraspinal tissues on the right. These changes are intimately involved with the wall thickening in the colon and may be related to localized perforation. There is an apparent area of breakdown of the medial wall of the ascending colon best seen on image number 36 of series 2. It Is better delineated on today's exam due to the administration of oral contrast. No obstructive changes are seen. No other focal abnormality in the colon is noted. Vascular/Lymphatic: Aortic atherosclerosis. No enlarged abdominal or pelvic lymph nodes. Reproductive: Prostate is unremarkable. Other: No abdominal wall  hernia or abnormality. No abdominopelvic ascites. Musculoskeletal: There remain changes as described above in the L1 vertebral body. The degree of mottled air extrinsic to the vertebral body particularly anteriorly behind the aorta has increased. Lytic lesion is noted in the right half of the L2 vertebral body consistent with metastatic disease. This also has a small amount of air within but stable from the prior exam. Air is also noted in the L1-2 disc space. IMPRESSION: Persistent changes of L1 compression deformity with superimposed mottled air and fluid consistent with localized infection. There is irregularity of the medial wall of the ascending colon as described suggestive of contained perforation. This would account for the local spread of apparent infection. Lytic lesion within the L2 vertebral body stable from the prior exam. Stable enhancing lesion in the right hepatic lobe consistent with metastatic disease. Areas of decreased attenuation within the tip of the liver adjacent to the area of abnormality in the colon. This may represent some localized inflammatory change as well. Increased bilateral pleural effusions with bibasilar atelectasis Electronically Signed   By: Inez Catalina M.D.   On: 02/12/2016 13:54    Scheduled Meds: . docusate sodium  100 mg Oral BID  . febuxostat  40 mg Oral Daily  . LORazepam  0.5-1 mg Oral QHS    LOS: 6 days   Vance Gather, MD [facility] Pager [phone]  If 7PM-7AM, please contact night-coverage [email or web address] Password [email or web address] 02/14/2016, 1:16 PM

## 2016-02-14 NOTE — Progress Notes (Signed)
Nutrition Follow-up  DOCUMENTATION CODES:   Severe malnutrition in context of acute illness/injury  INTERVENTION:  After discussion with patient and family on rounds, patient has decided to go home with hospice.  Will not proceed with NG tube for tube feeding.   Will make Ensure Enlive order PRN.  NUTRITION DIAGNOSIS:   Inadequate oral intake related to poor appetite as evidenced by per patient/family report.  Ongoing.  GOAL:   Patient will meet greater than or equal to 90% of their needs  Not met.  MONITOR:   PO intake, Supplement acceptance, Weight trends, Labs, I & O's  REASON FOR ASSESSMENT:   Malnutrition Screening Tool    ASSESSMENT:   65 year old male with metastatic renal cell carcinoma, hypertension, CAD, recent L1 pathological compression fracture with acute back pain presented to ED with rectal bleeding. Patient has been having intractable lower back pain due to L1 compression fracture and had been taking Dilaudid and long-acting morphine, mostly bedbound in the last 1 week. Family had been trying to schedule outpatient kyphoplasty with Dr. Estanislado Pandy. This morning, patient woke up around 4:30am with bright red blood in the stool, subsequently had 2 more episodes. Per patient's son, he had 2 syncopal episodes, has been feeling dizzy and lightheaded since then. Patient is on aspirin 81 mg daily, not on any anticoagulation or NSAIDS. Patient denies any abdominal pain nausea or vomiting.  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 -Per surgery patient is not a surgical candidate at this time but if nutrition status improves and patient improves significantly will see patient again. -Patient is being followed by Palliative Medicine. Per chart, family still pursuing full scope treatment even though Palliative believes patient would benefit from comfort measures and hospice-type  care.  -Family requesting NG tube for tube feeding. RD received consult for TF management and initiation.   Patient sleeping at time of assessment. Spoke with family members at bedside. Report patient still has very poor appetite. He is only having small sips of Ensure or juice at a time and bites of soft foods like fruit cocktail, yogurt, and gelatin. Denies N/V or abdominal pain. Reports patient has still not had a BM since admission. Difficulty chewing and swallowing in setting of mouth sores, also reports taste changes affecting intake.   Meal Completion: <25% per family report - patient only taking bites of food and sips of Ensure.  Medications reviewed and include: Colace, Dilaudid Q4hrs, Ativan daily, pantoprazole, vancomycin, NS @ 75 ml/hr (1.8 L/day).  Labs reviewed: Sodium 127, Chloride 93, Glucose 121, Albumin 1.9, Total Protein 5.2.   Nutrition-Focused physical exam completed. Findings are no fat depletion, moderate muscle depletion in temple region, and no edema.   Diet Order:  Diet regular Room service appropriate? Yes; Fluid consistency: Thin  Skin:  Reviewed, no issues  Last BM:  12/8   Height:   Ht Readings from Last 1 Encounters:  02/08/16 5' 8"  (1.727 m)    Weight:   Wt Readings from Last 1 Encounters:  02/08/16 161 lb (73 kg)    Ideal Body Weight:  70 kg  BMI:  Body mass index is 24.48 kg/m.  Estimated Nutritional Needs:   Kcal:  7253-6644 (30-32 kcal/kg)  Protein:  88-102 grams (1.2-1.4 grams/kg)  Fluid:  >/= 2 L/day  EDUCATION NEEDS:   No education needs identified at this time  Willey Blade, MS, RD, LDN Pager: [phone] After Hours Pager: [phone]

## 2016-02-14 NOTE — Progress Notes (Signed)
Pharmacy Antibiotic Note  Derek [name] is a 65 y.o. 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  Pharmacy was initially consulted for Cefepime/Vancomycin dosing, now changed to Meropenem/Vancomycin per ID consult.  Today, 02/14/2016: - Day 6 antibiotic, Vancomycin & Meropenem were discontinued today with goal of comfort care. Another son came to the hospital today following these discussions and requests transfer to [facility] for second opinion. I have placed a call to the referral center there and will discuss the case with general surgery on-call. Antibiotics resumed  Plan:  Resume Meropenem 1g IV q8h.  Resume Vancomycin 750g IV q8h  Height: 5\' 8"  (172.7 cm) Weight: 161 lb (73 kg) IBW/kg (Calculated) : 68.4  Temp (24hrs), Avg:98.7 F (37.1 C), Min:98.5 F (36.9 C), Max:99.2 F (37.3 C)   Recent Labs Lab 02/08/16 0725 02/09/16 0514 02/10/16 0533 02/11/16 0444 02/12/16 0444 02/12/16 2047 02/14/16 0503  WBC  --  10.1 8.6 7.8 7.6  --  6.8  CREATININE  --  0.77 0.90 0.73 0.74  --  0.68  LATICACIDVEN 1.51  --   --   --   --   --   --   VANCOTROUGH  --   --   --   --   --  12*  --     Estimated Creatinine Clearance: 86.7 mL/min (by C-G formula based on SCr of 0.68 mg/dL).    Allergies  Allergen Reactions  . Lipitor [Atorvastatin] Other (See Comments)    Myopathy...severe, with  Myoglobinuria, wheelchair bound for a few days  . Penicillins Anaphylaxis    As a child Has patient had a PCN reaction causing immediate rash, facial/tongue/throat swelling, SOB or lightheadedness with hypotension: yes Has patient had a PCN reaction causing severe rash involving mucus membranes or skin necrosis: yes Has patient had a PCN reaction that required hospitalization: yes Has patient had a PCN reaction occurring within the last 10  years: no If all of the above answers are "NO", then may proceed with Cephalosporin use.   . Prednisone Anxiety and Other (See Comments)    Extremely high blood pressure after taking medication for first time.   Antimicrobials this admission:  12/9 cefepime >> 12/9 12/9 vancomycin >> 12/14, resumed 12/14 12/10 meropenem >>12/14, resumed 12/14  Dose adjustments this admission:  12/12 @ 20:47 VT = 12 mcg/ml on 1gm q12h ==> change to 750mg  IV q8h  Microbiology results:  12/8 UCx: 60k CoNS FINAL  Thank you for allowing pharmacy to be a part of this patient's care.  Minda Ditto PharmD Pager [phone] 02/14/2016, 6:30 PM

## 2016-02-15 DIAGNOSIS — C7951 Secondary malignant neoplasm of bone: Secondary | ICD-10-CM

## 2016-02-15 NOTE — Progress Notes (Signed)
Spoke with pt's wife, sons and daughter in laws concerning discharge plans. Pt's wife would like the following things:IVF one or two times a week, no PICC-just peripheral IV, Oral antibiotics, liquid morphine, Foley Catheter care, HHRN, HHNA and hospital bed (total electric that will convert into a chair).  Explained that the bed is an item that is not supplied by [facility].  The bed from [facility] is a semi electric bed. Family will need to purchase this bed themselves and they understood this. Explained the difference between NS and LR. Also that there is a shortage of IVF in the community at present time. Family are okay with LR fluids at this time.  Plan for discharge is 11 AM in the morning.  Referral given to in house rep with [facility].

## 2016-02-15 NOTE — Progress Notes (Signed)
PROGRESS NOTE  Derek [name]  ER:[id number] DOB: 20-Apr-1950 DOA: 02/08/2016 PCP: Mackie Pai, PA-C   Brief Narrative:  Derek [name] is a 65 year old male with metastatic renal cell carcinoma s/p right nephrectomy, XRT and active chemo, CAD s/p CABGx4, recent L1 pathological compression fracture with acute back pain who presented to ED with rectal bleeding and intractable lower back pain due to L1 compression fracture. He had been taking dilaudid and long-acting morphine with little improvement, mostly bedbound in the last 1 week. On the morning of admission he woke up around 4:30am with bright red blood in the stool, subsequently had 2 more episodes. Per patient's son, he had 2 syncopal episodes, has been feeling dizzy and lightheaded since then. 10 days PTA he was ambulating with a cane and had gone on a cruise.   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 Neurosurgery, General Surgery, ID, and IR were consulted. General surgery has signed off stating he is not an operative candidate. Palliative care was consulted and is assisting with pain management with dilaudid PCA. Second opinion sought at [facility] and per Dr. Cherylann Ratel, transfer would not change management. His condition has remained stable, if grave, and benefit of hospitalization has been maximized. The patient strongly desires to go home, so this is being arranged.   Assessment & Plan:   Principal Problem:   Retroperitoneal air Active Problems:   HTN (hypertension)   Prediabetes   S/P CABG x 4   Metastatic renal cell carcinoma (HCC)   Normocytic anemia   Hyperlipidemia   L1 Metastasis   Dehydration   Acute lower GI bleeding   Acute GI bleeding   Coronary artery disease   Unintentional weight loss   Protein-calorie malnutrition (HCC)   Intraabdominal fluid collection   Palliative care  encounter   Neoplasm related pain   Protein-calorie malnutrition, severe   Retroperitoneal abscess (HCC)   Colon perforation (HCC)  Contained perforation of right colon at hepatic flexure with adjacent fistula/abscess: Air and stool seen in vertebrae on CT at admission  - General surgery, Dr. Zella Richer believes the patient is not a surgical candidate at this time. Surgery signed off. [facility] for transfer 12/14, discussed case in detail with Dr. Lucius Conn on 12/15 who had reviewed data from current hospitalization. She declines transfer on the basis that her medical opinion is that he is not a surgical candidate and no change in therapy would be made. - Continuing vancomycin and meropenem (received cefepime 12/9) per ID recommendations. ID doesn't expect antibiotics to cure this.  - Will transition to oral levaquin/flagyl for 10 days on discharge as pt is taking po reliably.  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   Metastatic renal cell carcinoma with L1 metastatic disease, pathologic compression fractures of L1 and L2, intractable back pain: Patient is s/p nephrectomy in 2016 and follows Dr. Alen Blew, recently undergoing chemo and plan for kyphoplasty as outpatient prior to admission.  - Appreciate Dr. Hazeline Junker assistance. Discussed with him this morning and agree entirely with his note 12/15 "This continues to be a challenging situation for the  patient and his family. His 2 sons are requesting transfer to [facility] for further care. I explained my opinion to the patient and his wife that this mouth [sic] is unlikely to yield a different result and his prognosis is poor regardless. The family is struggling with  letting go and accepting the impending reality of an aggressive cancer I will likely claim his life." - Appreciate input by General Surgery, Neurosurgery, ID, and IR.  - Appreciate assistance by Palliative Care for pain management:  - Will need recommendations regarding non-IV analgesia at discharge.   Severe protein-calorie malnutrition: Prealbumin < 5 and poor po x several weeks.  - Discussed with family that this is consistent with process of dying that predates this acute injury.  Acute lower GI bleeding: Resolved, ?constipation-induced hemorrhoids vs. due to perforation. Hgb low but stable in 7's. GI initially consulted. Presently not candidate for endoscopy secondary to below issues, signed off.   Hypotension with underlying history of HTN: BP currently remains stable - Holding coreg, lisinopril.  CAD s/p CABG: No indication of ACS. This is one of several comorbidities putting him at serious operative risk.  - Held ASA in anticipation of surgery and due to NPO.   Hyponatremia with dehydration: Improved but not resolved. - Pt and family requesting outpatient IV fluids.   Bladder outlet obstruction - Pt requests continuing foley cath and I believe this is appropriate for comfort at this time.  Bacteriuria: S. hemolyticus 60k CFU's consistent with colonization, and would certainly be treated if it were an infection with concurrent abx.  Grief reaction: Difficult to assess patient's mood given severity of illness, but I believe he and his family are in denial about the severity and incurable nature of his illness.  - Recommend monitoring this at follow up, consider counseling services  DVT prophylaxis: SCD's Code Status: Full Family Communication: Discussed with patient, sons, daughter in law, and wife Disposition Plan: Home with home health services as pt/family request ongoing antibiotics and have clearly not transitioned to hospice state of mind. Arranging hospital bed,  transportation, investigating possibility for outpatient IVF administration. Anticipate discharge 12/16 AM.  Consultants:   Neurosurgery, Dr. Arnoldo Morale  ID, Dr. Linus Salmons  IR, Dr. Pascal Lux  General surgery, Dr. Zella Richer  Palliative care, Dr. Rowe Pavy  Procedures:   None  Antimicrobials: Anti-infectives    Start     Dose/Rate Route Frequency Ordered Stop   02/15/16 0200  meropenem (MERREM) 1 g in sodium chloride 0.9 % 100 mL IVPB     1 g 200 mL/hr over 30 Minutes Intravenous Every 8 hours 02/14/16 1944     02/14/16 2000  vancomycin (VANCOCIN) IVPB 750 mg/150 ml premix     750 mg 150 mL/hr over 60 Minutes Intravenous Every 8 hours 02/14/16 1820     02/14/16 1830  meropenem (MERREM) 1 g in sodium chloride 0.9 % 100 mL IVPB  Status:  Discontinued     1 g 200 mL/hr over 30 Minutes Intravenous Every 8 hours 02/14/16 1820 02/14/16 1944   02/13/16 0600  vancomycin (VANCOCIN) IVPB 750 mg/150 ml premix  Status:  Discontinued     750 mg 150 mL/hr over 60 Minutes Intravenous Every 8 hours 02/12/16 2140 02/14/16 1315   02/12/16 2145  vancomycin (VANCOCIN) IVPB 1000 mg/200 mL premix     1,000 mg 200 mL/hr over 60 Minutes Intravenous STAT 02/12/16 2140 02/12/16 2255   02/10/16 1130  meropenem (MERREM) 1 g in sodium chloride 0.9 % 100  mL IVPB  Status:  Discontinued     1 g 200 mL/hr over 30 Minutes Intravenous Every 8 hours 02/10/16 1107 02/14/16 1315   02/09/16 1830  vancomycin (VANCOCIN) IVPB 1000 mg/200 mL premix  Status:  Discontinued     1,000 mg 200 mL/hr over 60 Minutes Intravenous Every 12 hours 02/09/16 1759 02/12/16 2139   02/09/16 1800  ceFEPIme (MAXIPIME) 2 g in dextrose 5 % 50 mL IVPB  Status:  Discontinued     2 g 100 mL/hr over 30 Minutes Intravenous Every 8 hours 02/09/16 1757 02/10/16 1050      Subjective: Pain better controlled, opens eyes more today, eating protein supplements/yogurt. Assents by nodding to disposition to home.  Objective: Vitals:   02/14/16 2049 02/14/16  2359 02/15/16 0540 02/15/16 0547  BP: (!) 160/86  (!) 149/85   Pulse: 86  73   Resp: 20 15 16 17   Temp: 98.3 F (36.8 C)  98.7 F (37.1 C)   TempSrc: Oral  Oral   SpO2: 99% 99% 100% 99%  Weight:      Height:        Intake/Output Summary (Last 24 hours) at 02/15/16 1520 Last data filed at 02/15/16 0600  Gross per 24 hour  Intake          2461.25 ml  Output             1550 ml  Net           911.25 ml   Filed Weights   02/08/16 0640  Weight: 73 kg (161 lb)    Examination: General exam: Awake but drowsy, in NAD.  Respiratory system: Nonlabored, 100% on 2L by [place], ETCO2 36, no audible wheezing Cardiovascular system: Regular rate, s1, s2, do not appreciate murmur. Midline sternal incision scar.  Gastrointestinal system: soft, mild diffuse tenderness without rebound, nondistended, pos BS, RUQ transverse incision scar. GU: + foley with light yellow urine in bag.  Central nervous system: Diffusely weak without focal deficits, no dysphasia with his short statements. Extremities: No deformities. Poor muscle tone and bulk. Requires 2 person assist for sitting up for pulmonary exam Skin: normal skin turgor, no bleeding Psychiatry: Appropriately responsive but very slowly.  Data Reviewed: I have personally reviewed following labs and imaging studies  CBC:  Recent Labs Lab 02/09/16 0514 02/09/16 1035 02/10/16 0533 02/11/16 0444 02/12/16 0444 02/14/16 0503  WBC 10.1  --  8.6 7.8 7.6 6.8  HGB 8.1* 8.1* 7.6* 7.3* 7.8* 8.4*  HCT 23.1* 23.0* 22.1* 20.7* 22.8* 24.3*  MCV 92.8  --  92.9 89.2 91.6 92.4  PLT 291  --  311 309 347 0000000   Basic Metabolic Panel:  Recent Labs Lab 02/09/16 0514 02/10/16 0533 02/11/16 0444 02/12/16 0444 02/14/16 0503  NA 128* 130* 128* 128* 127*  K 4.5 3.9 3.2* 3.8 3.7  CL 95* 97* 95* 96* 93*  CO2 27 26 27 27 29   GLUCOSE 111* 112* 112* 111* 121*  BUN 18 15 11 11 10   CREATININE 0.77 0.90 0.73 0.74 0.68  CALCIUM 8.0* 7.9* 7.6* 7.5* 7.6*    GFR: Estimated Creatinine Clearance: 86.7 mL/min (by C-G formula based on SCr of 0.68 mg/dL). Liver Function Tests:  Recent Labs Lab 02/14/16 0503  AST 26  ALT 23  ALKPHOS 66  BILITOT 0.7  PROT 5.2*  ALBUMIN 1.9*   Sepsis Labs: No results for input(s): PROCALCITON, LATICACIDVEN in the last 168 hours. Recent Results (from the past 240 hour(s))  Urine  culture     Status: Abnormal   Collection Time: 02/08/16  7:29 PM  Result Value Ref Range Status   Specimen Description URINE, CLEAN CATCH  Final   Special Requests NONE  Final   Culture 60,000 COLONIES/mL STAPHYLOCOCCUS HAEMOLYTICUS (A)  Final   Report Status 02/11/2016 FINAL  Final   Organism ID, Bacteria STAPHYLOCOCCUS HAEMOLYTICUS (A)  Final      Susceptibility   Staphylococcus haemolyticus - MIC*    CIPROFLOXACIN <=0.5 SENSITIVE Sensitive     GENTAMICIN <=0.5 SENSITIVE Sensitive     NITROFURANTOIN <=16 SENSITIVE Sensitive     OXACILLIN <=0.25 SENSITIVE Sensitive     TETRACYCLINE >=16 RESISTANT Resistant     VANCOMYCIN <=0.5 SENSITIVE Sensitive     TRIMETH/SULFA <=10 SENSITIVE Sensitive     CLINDAMYCIN <=0.25 SENSITIVE Sensitive     RIFAMPIN <=0.5 SENSITIVE Sensitive     Inducible Clindamycin NEGATIVE Sensitive     * 60,000 COLONIES/mL STAPHYLOCOCCUS HAEMOLYTICUS    Radiology Studies: No results found.  Scheduled Meds: . docusate sodium  100 mg Oral BID  . febuxostat  40 mg Oral Daily  . HYDROmorphone   Intravenous Q4H  . LORazepam  0.5-1 mg Oral QHS  . meropenem (MERREM) IV  1 g Intravenous Q8H  . vancomycin  750 mg Intravenous Q8H    LOS: 7 days   Vance Gather, MD [facility] Pager [phone]  If 7PM-7AM, please contact night-coverage [email or web address] Password [email or web address] 02/15/2016, 3:20 PM

## 2016-02-15 NOTE — Progress Notes (Signed)
IP PROGRESS NOTE  Subjective:   No changes noted clinically. He is slightly more awake and reporting pain a 4 out of 10.  Objective:  Vital signs in last 24 hours: Temp:  [98.3 F (36.8 C)-98.7 F (37.1 C)] 98.7 F (37.1 C) (12/15 0540) Pulse Rate:  [69-86] 73 (12/15 0540) Resp:  [14-20] 17 (12/15 0547) BP: (124-160)/(64-86) 149/85 (12/15 0540) SpO2:  [96 %-100 %] 99 % (12/15 0547) Weight change:  Last BM Date: 02/08/16  Intake/Output from previous day: 12/14 0701 - 12/15 0700 In: 2521.3 [P.O.:300; I.V.:1721.3; IV Piggyback:500] Out: 2250 [Urine:2250] Lethargic but arousable. Did not appear in any distress. Mouth: Dry mucous membranes with oral ulcers noted. Resp: clear to auscultation bilaterally Cardio: regular rate and rhythm, S1, S2 normal, no murmur, click, rub or gallop GI: soft, non-tender; bowel sounds normal; no masses,  no organomegaly Extremities: extremities normal, atraumatic, no cyanosis or edema    Lab Results:  Recent Labs  02/14/16 0503  WBC 6.8  HGB 8.4*  HCT 24.3*  PLT 391    BMET  Recent Labs  02/14/16 0503  NA 127*  K 3.7  CL 93*  CO2 29  GLUCOSE 121*  BUN 10  CREATININE 0.68  CALCIUM 7.6*    Studies/Results: No results found.  Medications: I have reviewed the patient's current medications.  Assessment/Plan:  65 year old with the following issues:  1. Metastatic renal cell carcinoma: He is currently on salvage therapy utilizing Cabometyx which has been withheld at this time.   I agree with holding treatment at this time given the acute illness he is experiencing..  2. 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 Input from infectious disease, neurosurgery and Gen. surgery is appreciated. It is a certainly complex situation and I defer the management of this to the services.  CT scan on 02/12/2016 Was reviewed and discussed with the family on previous  occasions.   This continues to be a challenging situation for the patient and his family. His 2 sons are requesting transfer to [facility] for further care. I explained my opinion to the patient and his wife that this mouth is unlikely to yield a different result and his prognosis is poor regardless. The family is struggling with letting go and accepting the impending reality of an aggressive cancer I will likely claim his life.  I do not believe that his alcohol change with a transfer to a tertiary care facility given the aggressive nature of his cancer. However, if the transfers excepted and his family is willing that I have no objections certainly to this mouth. It might offer the patient and his family peace of mind of accepting his reality.  Transfer to hospice would be an acceptable alternative from my standpoint.  3. Pain: Controlled at this time with Dilaudid PCA. Appreciate input from palliative medicine services.   4. Prognosis: This has been addressed on multiple occasions. His prognosis is poor with limited life expectancy regardless of any intervention he will undergo.  Please call with any questions regarding his care in the next few days. I will visit with him on Monday, 02/18/2016 if he is still hospitalized.     LOS: 7 days   Hermena Swint 02/15/2016, 7:56 AM

## 2016-02-15 NOTE — Progress Notes (Signed)
Daily Progress Note   Patient Name: Derek [name]       Date: 02/15/2016 DOB: 1950-06-18  Age: 65 y.o. MRN#: [id number] Attending Physician: Patrecia Pour, MD Primary Care Physician: Elise Benne Admit Date: 02/08/2016  Reason for Consultation/Follow-up: Establishing goals of care and Pain control  Subjective: Patient is comfortable on current PCA settings.  Wife and family at bedside, see discussions below  Length of Stay: 7  Current Medications: Scheduled Meds:  . docusate sodium  100 mg Oral BID  . febuxostat  40 mg Oral Daily  . HYDROmorphone   Intravenous Q4H  . LORazepam  0.5-1 mg Oral QHS  . meropenem (MERREM) IV  1 g Intravenous Q8H  . vancomycin  750 mg Intravenous Q8H    Continuous Infusions: . sodium chloride 75 mL/hr at 02/14/16 2051    PRN Meds: acetaminophen **OR** acetaminophen, feeding supplement (ENSURE ENLIVE), magic mouthwash, ondansetron **OR** ondansetron (ZOFRAN) IV  Physical Exam      General: Alert, awake, in no acute distress. Resting with eyes closed but responds appropriately to questions.  Chronically ill appearing. HEENT: No bruits, no goiter, no JVD Heart: Regular rate and rhythm. No murmur appreciated. Lungs: Fair air movement, clear Abdomen: Soft, globally tender, nondistended, positive bowel sounds.  Ext: +LE edema Skin: Warm and dry, pallor Neuro: Grossly intact, nonfocal.     Vital Signs: BP (!) 149/85 (BP Location: Right Arm)   Pulse 73   Temp 98.7 F (37.1 C) (Oral)   Resp 17   Ht 5\' 8"  (1.727 m)   Wt 73 kg (161 lb)   SpO2 99%   BMI 24.48 kg/m  SpO2: SpO2: 99 % O2 Device: O2 Device: Nasal Cannula O2 Flow Rate: O2 Flow Rate (L/min): 2 L/min  Intake/output summary:   Intake/Output Summary (Last 24 hours) at 02/15/16  1308 Last data filed at 02/15/16 0600  Gross per 24 hour  Intake          2461.25 ml  Output             1550 ml  Net           911.25 ml   LBM: Last BM Date: 02/08/16 Baseline Weight: Weight: 73 kg (161 lb) Most recent weight: Weight: 73 kg (161 lb)       Palliative  Assessment/Data:    Flowsheet Rows   Flowsheet Row Most Recent Value  Intake Tab  Referral Department  Hospitalist  Unit at Time of Referral  Med/Surg Unit  Palliative Care Primary Diagnosis  Cancer  Date Notified  02/09/16  Palliative Care Type  New Palliative care  Reason for referral  Clarify Goals of Care  Date of Admission  02/08/16  Date first seen by Palliative Care  02/09/16  # of days Palliative referral response time  0 Day(s)  # of days IP prior to Palliative referral  1  Clinical Assessment  Palliative Performance Scale Score  40%  Pain Max last 24 hours  8  Pain Min Last 24 hours  0  Psychosocial & Spiritual Assessment  Palliative Care Outcomes  Patient/Family meeting held?  Yes  Who was at the meeting?  Patient, wife, 2 sons, dtr in Sports coach, family friend  Palliative Care Outcomes  Clarified goals of care      Patient Active Problem List   Diagnosis Date Noted  . Retroperitoneal abscess ([place])   . Colon perforation ([place])   . Protein-calorie malnutrition, severe 02/12/2016  . Palliative care encounter   . Neoplasm related pain   . Intraabdominal fluid collection   . Coronary artery disease 02/10/2016  . Unintentional weight loss 02/10/2016  . Protein-calorie malnutrition ([place]) 02/10/2016  . Retroperitoneal air 02/10/2016  . Acute lower GI bleeding 02/08/2016  . Acute GI bleeding 02/08/2016  . Diarrhea 10/29/2015  . Dehydration 10/29/2015  . 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 Medication management 09/21/2015  . Hyperlipidemia 12/11/2014  . Normocytic anemia 12/02/2014  . Metastatic renal cell carcinoma ([place]) 11/28/2014  . Gout 02/17/2014  . 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 HTN (hypertension)  01/23/2014  . Obesity 01/23/2014  . Prediabetes 01/23/2014  . Systolic and diastolic CHF, acute ([place]) 01/21/2014  . NSTEMI (non-ST elevated myocardial infarction) ([place]) 01/21/2014  . LVH (left ventricular hypertrophy) due to hypertensive disease 01/13/2014    Palliative Care Assessment & Plan   Patient Profile: 65 y.o. male  with past medical history of stage IV renal cancer s/p R nephrectomy, radiation, and chemo.  He has known history of L1 mets.  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  Palliative consulted for goals of care.  Recommendations/Plan: Pain: continue current PCA settings.   Goals of care discussions: discussed with wife and sons along with Dr Bonner Puna:   Patient's case has been discussed with faculty from [facility], they have not deemed it appropriate for the patient to be transferred to [facility]. This was conveyed to the patient, wife and 2 sons. Joint family meeting with Dr Bonner Puna undertaken. Discussions held regarding disposition options. Explained about hospice philosophy, comfort oriented care. Patient and family would like to go home, with home health care. They would like to get access to his medical records so that they may reach out to other hospitals/ surgery groups for second opinion.   Continue current PCA settings, would be best if home health care can manage current PCA settings at home, would be best if patient goes home with foley catheter too.   Palliative will continue to follow.  Goals of Care and Additional Recommendations:  Limitations on Scope of Treatment: Full Scope Treatment  Code Status:    Code Status Orders  Start     Ordered   02/08/16 1044  Full code  Continuous     02/08/16 1043    Code Status History    Date Active Date Inactive Code Status  Order ID Comments User Context   11/28/2014  2:41 PM 12/02/2014  4:25 PM Full Code [id number]  Cleon Gustin, MD Inpatient   01/27/2014  1:17 PM 01/29/2014 12:39 PM Full Code [id number]  John Giovanni, PA-C Inpatient   01/23/2014  5:51 PM 01/27/2014  1:17 PM Full Code [id number]  Sherren Mocha, MD Inpatient   01/21/2014 11:41 AM 01/23/2014  5:51 PM Full Code [id number]  Lonn Georgia, PA-C Inpatient    Advance Directive Documentation   Flowsheet Row Most Recent Value  Type of Advance Directive  Healthcare Power of Attorney  Pre-existing out of facility DNR order (yellow form or pink MOST form)  No data  "MOST" Form in Place?  No data       Prognosis:   guarded.   Discharge Planning:  Home with home health   Care plan was discussed with patient, wife 2 sons Dr Bonner Puna  Thank you for allowing the Palliative Medicine Team to assist in the care of this patient.   Time In: 12.30 Time Out: 1300 Total Time 30 Prolonged Time Billed No      Greater than 50%  of this time was spent counseling and coordinating care related to the above assessment and plan.  Loistine Chance, MD [phone]  Please contact Palliative Medicine Team phone at [phone] for questions and concerns.

## 2016-02-16 MED ORDER — FENTANYL 25 MCG/HR TD PT72: 25.0000 ug | MEDICATED_PATCH | TRANSDERMAL | 0 refills | Status: DC

## 2016-02-16 MED ORDER — ONDANSETRON HCL 4 MG PO TABS: 4.0000 mg | ORAL_TABLET | Freq: Three times a day (TID) | ORAL | 0 refills | Status: AC | PRN

## 2016-02-16 MED ORDER — ENSURE ENLIVE PO LIQD: 237.0000 mL | Freq: Two times a day (BID) | ORAL | 12 refills | Status: AC | PRN

## 2016-02-16 MED ORDER — METRONIDAZOLE 500 MG PO TABS: 500.0000 mg | ORAL_TABLET | Freq: Three times a day (TID) | ORAL | 0 refills | Status: AC

## 2016-02-16 MED ORDER — SENNA 8.6 MG PO TABS: 1.0000 | ORAL_TABLET | Freq: Two times a day (BID) | ORAL | 0 refills | Status: AC

## 2016-02-16 MED ORDER — LEVOFLOXACIN 750 MG PO TABS: 750.0000 mg | ORAL_TABLET | Freq: Every day | ORAL | 0 refills | Status: AC

## 2016-02-16 MED ORDER — METRONIDAZOLE 500 MG PO TABS
500.0000 mg | ORAL_TABLET | Freq: Three times a day (TID) | ORAL | Status: DC
  Administered 2016-02-16: 500 mg via ORAL
  Filled 2016-02-16: qty 1

## 2016-02-16 MED ORDER — FENTANYL 25 MCG/HR TD PT72
25.0000 ug | MEDICATED_PATCH | TRANSDERMAL | Status: DC
  Administered 2016-02-16: 25 ug via TRANSDERMAL
  Filled 2016-02-16: qty 1

## 2016-02-16 MED ORDER — POLYETHYLENE GLYCOL 3350 17 GM/SCOOP PO POWD: 17.0000 g | Freq: Two times a day (BID) | ORAL | 0 refills | Status: AC | PRN

## 2016-02-16 MED ORDER — HYDROMORPHONE HCL 1 MG/ML PO LIQD: 2.0000 mg | ORAL | 0 refills | Status: AC | PRN

## 2016-02-16 MED ORDER — LEVOFLOXACIN 750 MG PO TABS
750.0000 mg | ORAL_TABLET | Freq: Every day | ORAL | Status: DC
  Administered 2016-02-16: 750 mg via ORAL
  Filled 2016-02-16: qty 1

## 2016-02-16 MED ORDER — HYDROMORPHONE HCL 2 MG PO TABS
2.0000 mg | ORAL_TABLET | ORAL | Status: DC | PRN
  Administered 2016-02-16: 4 mg via ORAL
  Filled 2016-02-16: qty 2

## 2016-02-16 NOTE — Progress Notes (Addendum)
Pharmacy Antibiotic Note  Luby Pettijohn is a 65 y.o. 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  Pharmacy was initially consulted for Cefepime/Vancomycin dosing, then changed to Meropenem/Vancomycin per ID consult then changed to Levaquin on 12/16 to complete 10 days total  Today, 02/16/2016: - Day 8 total antibiotic of planned 10 days total, Vancomycin & Meropenem were discontinued today and Levaquin to start  Plan:  For CrCl > 50 ml/min, start Levaquin 750mg  PO daily  Height: 5\' 8"  (172.7 cm) Weight: 161 lb (73 kg) IBW/kg (Calculated) : 68.4  Temp (24hrs), Avg:97.7 F (36.5 C), Min:97.4 F (36.3 C), Max:98 F (36.7 C)   Recent Labs Lab 02/10/16 0533 02/11/16 0444 02/12/16 0444 02/12/16 2047 02/14/16 0503  WBC 8.6 7.8 7.6  --  6.8  CREATININE 0.90 0.73 0.74  --  0.68  VANCOTROUGH  --   --   --  12*  --     Estimated Creatinine Clearance: 86.7 mL/min (by C-G formula based on SCr of 0.68 mg/dL).    Allergies  Allergen Reactions  . Lipitor [Atorvastatin] Other (See Comments)    Myopathy...severe, with  Myoglobinuria, wheelchair bound for a few days  . Penicillins Anaphylaxis    As a child Has patient had a PCN reaction causing immediate rash, facial/tongue/throat swelling, SOB or lightheadedness with hypotension: yes Has patient had a PCN reaction causing severe rash involving mucus membranes or skin necrosis: yes Has patient had a PCN reaction that required hospitalization: yes Has patient had a PCN reaction occurring within the last 10 years: no If all of the above answers are "NO", then may proceed with Cephalosporin use.   . Prednisone Anxiety and Other (See Comments)    Extremely high blood pressure after taking medication for first time.   Antimicrobials this admission:  12/9 cefepime >> 12/9 12/9  vancomycin >> 12/14, resumed 12/14 >> 12/16 12/10 meropenem >>12/14, resumed 12/14 >> 12/16 12/16 Levaquin >>  Dose adjustments this admission:  12/12 @ 20:47 VT = 12 mcg/ml on 1gm q12h ==> change to 750mg  IV q8h  Microbiology results:  12/8 UCx: 60k CoNS FINAL   Thank you for allowing pharmacy to be a part of this patient's care.   Adrian Saran, PharmD, BCPS Pager [phone] 02/16/2016 10:23 AM

## 2016-02-16 NOTE — Discharge Summary (Signed)
Physician Discharge Summary  Derek [name] ER:[id number] DOB: 06-19-1950 DOA: 02/08/2016  PCP: Mackie Pai, PA-C  Admit date: 02/08/2016 Discharge date: 02/16/2016  Admitted From: Home Disposition: Home   Recommendations for Outpatient Follow-up:  1. Follow up with Dr. Alen Blew in next week 2. Follow up pain management: discharged on fentanyl 53mcg patch and dilaudid 2-4mg  q4h prn, equianalgesic to PCA dosing while inpatient 3. Consider referral to grief counseling. Family seems to be in denial of gravity of morbidity. Hospice was recommended as disposition, but pt will go home with home health, po antibiotics, and IV fluids.  4. Please obtain CBC to monitor anemia (hgb 8.4 at discharge) and BMP to monitor electrolytes, including sodium (Na 127 at discharge).  Home Health: RN, aide Equipment/Devices: Foley (placed 12/10), hospital bed, IV equipment for home LR.  Discharge Condition: Stable for transfer to home, very guarded prognosis CODE STATUS: Full Diet recommendation: As tolerated.  Brief/Interim Summary: Derek [name] is a 65 year old male with metastatic renal cell carcinoma s/p right nephrectomy, XRT and active chemo, CAD s/p CABGx4, recent L1 pathological compression fracture with acute back pain who presented to ED with rectal bleeding and intractable lower back pain due to L1 compression fracture. He had been taking dilaudid and long-acting morphine with little improvement, mostly bedbound in the last 1 week. On the morning of admission he woke up around 4:30am with bright red blood in the stool, subsequently had 2 more episodes. Per patient's son, he had 2 syncopal episodes, has been feeling dizzy and lightheaded since then. 10 days PTA he was ambulating with a cane and had gone ona cruise.   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  Neurosurgery, General Surgery, ID, and IR were consulted. General surgery has signed off stating he is not an operative candidate. Palliative care was consulted and is assisting with pain management with dilaudid PCA. Second opinion sought at [facility] and per Dr. Cherylann Ratel, transfer would not change management. His condition has remained stable, if grave, and benefit of hospitalization has been maximized. The patient strongly desires to go home, so this is has been arranged. He will receive oral antibiotics and pain management with fentanyl patch and oral dilaudid. Home health RN and aid will be provided and daily IV fluids will be given at home. The patient and family are strongly urged to consider palliative/hospice care and to follow up with Dr. Alen Blew for ongoing management.    Discharge Diagnoses:  Principal Problem:   Retroperitoneal air Active Problems:   HTN (hypertension)   Prediabetes   S/P CABG x 4   Metastatic renal cell carcinoma (HCC)   Normocytic anemia   Hyperlipidemia   L1 Metastasis   Dehydration   Acute lower GI bleeding   Acute GI bleeding   Coronary artery disease   Unintentional weight loss   Protein-calorie malnutrition (HCC)   Intraabdominal fluid collection   Palliative care encounter   Neoplasm related pain   Protein-calorie malnutrition, severe   Retroperitoneal abscess (HCC)   Colon perforation (HCC)  Contained perforation of right colon at hepatic flexure with adjacent fistula/abscess: Air and stool seen in vertebrae on CT at admission  - General surgery, Dr. Zella Richer believes the patient is not a surgical candidate at this time. Surgery signed off. [facility] for transfer 12/14, discussed case in detail with Dr. Lucius Conn on 12/15 who had reviewed data  from current hospitalization. She declines transfer on the basis that her medical opinion is that he is not a surgical candidate and no change in therapy would be made. - Continuing vancomycin and meropenem  (received cefepime 12/9) per ID recommendations. ID doesn't expect antibiotics to cure this.  - Will transition to oral levaquin/flagyl for 10 days on discharge per ID recommendations as pt is taking po reliably.  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   Metastatic renal cell carcinomawith L1 metastatic disease,pathologic compression fractures of L1 and L2, intractable back pain: Patient is s/p nephrectomy in 2016 and follows Dr. Alen Blew, recently undergoing chemo and plan for kyphoplasty as outpatient prior to admission.  - Appreciate Dr. Hazeline Junker assistance. Discussed with him throughout admission and agree entirely with his note 12/15 "This continues to be a challenging situation for the patient and his family. His 2 sons are requesting transfer to [facility] for further care. I explained my opinion to the patient and his wife that this mouth [sic] is unlikely to yield a different result and his prognosis is poor regardless. The family is struggling with letting go and accepting the impending reality of an aggressive cancer I will likely claim his life." - Appreciate input by General Surgery, Neurosurgery, ID, and IR.  - Appreciate assistance by Palliative Care for pain management: transition to fentanyl + dilaudid as above  Severe protein-calorie malnutrition: Prealbumin < 5 and poor po x several weeks.  - Discussed with family that this is consistent with process of dying that predates this acute injury. - Dietician consulted, discharge with ensure  Acute lower GI bleeding: Resolved, ?constipation-induced hemorrhoids vs. due to perforation. Hgb low but stable in 7's. GI initially consulted. Presently not  candidate for endoscopy secondary to below issues, signed off.  Hypotension with underlying history ofHTN: BP currently remains stable off meds - Holding coreg, lisinopril.  CAD s/p CABG: No indication of ACS. This is one of several comorbidities putting him at serious operative risk.  - Held ASA in anticipation of surgery and due to NPO, restarted.   Hyponatremia with dehydration: Improved but not resolved. - Pt and family requesting outpatient IV fluids, has been arranged.  Bladder outlet obstruction: Will perform voiding trial prior to discharge, pt has urinal at home. If able to avoid, will discharge without foley, otherwise will restart foley prior to discharge.   Bacteriuria: S. hemolyticus 60k CFU's consistent with colonization, and would certainly be treated if it were an infection with concurrent abx.  Grief reaction: Difficult to assess patient's mood given severity of illness, but I believe he and his family are in denial about the severity and incurable nature of his illness.  - Recommend monitoring this at follow up, consider counseling services  Discharge Instructions Discharge Instructions    Call MD for:  difficulty breathing, headache or visual disturbances    Complete by:  As directed    Call MD for:  hives    Complete by:  As directed    Call MD for:  persistant nausea and vomiting    Complete by:  As directed    Call MD for:  severe uncontrolled pain    Complete by:  As  directed    Call MD for:  temperature >100.4    Complete by:  As directed    Discharge instructions    Complete by:  As directed    You were admitted with a perforation in the colon that formed a fistula tract leading to your spine. The resultant infection is being treated with antibiotics.  - Continue taking levaquin daily (start tomorrow morning) - Continue taking flagyl three times daily (take 2 of the doses today)  - Continue these for 10 days/until you run out of medications.   The  pain related to this and your spinal fractures has been controlled with a dilaudid PCA pump and equal dosing with a fentanyl patch and oral dilaudid solution have been prescribed for you. Take this as directed. - The next time you replace the fentanyl patch will be 12/19.   Continue to take ensure/other nutrition by mouth as able. You will also receive IV fluids at home until you follow up with Dr. Alen Blew who can make recommendations regarding this.  - Call Dr. Hazeline Junker office Monday to schedule an appointment and cancel the scheduled infusion therapy.   Your blood pressure has been normal without taking blood pressure medications:  - Stop taking coreg and lisinopril until you follow up.  You will receive home health services as arranged by our Care Managers, including hospital bed, IV equipment, nurse and nurse aide.    All prescriptions have been printed and provided to you on paper. The [facility] is closed on the weekends, so please obtain prescriptions elsewhere today.   Increase activity slowly    Complete by:  As directed      Allergies as of 02/16/2016      Reactions   Lipitor [atorvastatin] Other (See Comments)   Myopathy...severe, with  Myoglobinuria, wheelchair bound for a few days   Penicillins Anaphylaxis   As a child Has patient had a PCN reaction causing immediate rash, facial/tongue/throat swelling, SOB or lightheadedness with hypotension: yes Has patient had a PCN reaction causing severe rash involving mucus membranes or skin necrosis: yes Has patient had a PCN reaction that required hospitalization: yes Has patient had a PCN reaction occurring within the last 10 years: no If all of the above answers are "NO", then may proceed with Cephalosporin use.   Prednisone Anxiety, Other (See Comments)   Extremely high blood pressure after taking medication for first time.      Medication List    STOP taking these medications   cabozantinib S-Malate 40 MG  Tabs Commonly known as:  CABOMETYX   carvedilol 12.5 MG tablet Commonly known as:  COREG   diphenoxylate-atropine 2.5-0.025 MG tablet Commonly known as:  LOMOTIL   HYDROmorphone 4 MG tablet Commonly known as:  DILAUDID Replaced by:  HYDROmorphone HCl 1 MG/ML Liqd   lisinopril 40 MG tablet Commonly known as:  PRINIVIL,ZESTRIL   morphine 30 MG 12 hr tablet Commonly known as:  MS CONTIN     TAKE these medications   aspirin EC 81 MG tablet Take 81 mg by mouth daily.   colchicine 0.6 MG tablet Reported on 08/17/2015   docusate sodium 100 MG capsule Commonly known as:  COLACE Take 1 capsule (100 mg total) by mouth 2 (two) times daily.   feeding supplement (ENSURE ENLIVE) Liqd Take 237 mLs by mouth 2 (two) times daily as needed (As desired by patient.).   fentaNYL 25 MCG/HR patch Commonly known as:  [place] - dosed mcg/hr Place 1  patch (25 mcg total) onto the skin every 3 (three) days.   HYDROmorphone HCl 1 MG/ML Liqd Commonly known as:  DILAUDID Take 2-4 mLs (2-4 mg total) by mouth every 4 (four) hours as needed for severe pain. Replaces:  HYDROmorphone 4 MG tablet   KLOR-CON M20 20 MEQ tablet Generic drug:  potassium chloride SA TAKE ONE TABLET BY MOUTH ONCE DAILY   levofloxacin 750 MG tablet Commonly known as:  LEVAQUIN Take 1 tablet (750 mg total) by mouth daily. Start taking on:  02/17/2016   LORazepam 0.5 MG tablet Commonly known as:  ATIVAN Take 0.5-1 mg by mouth at bedtime.   magic mouthwash Soln Take 5 mLs by mouth 3 (three) times daily as needed for mouth pain.   metroNIDAZOLE 500 MG tablet Commonly known as:  FLAGYL Take 1 tablet (500 mg total) by mouth every 8 (eight) hours.   multivitamin capsule Take 1 capsule by mouth daily.   omega-3 acid ethyl esters 1 g capsule Commonly known as:  LOVAZA Take 1 g by mouth daily.   ondansetron 4 MG tablet Commonly known as:  ZOFRAN Take 1 tablet (4 mg total) by mouth every 8 (eight) hours as needed  for nausea or vomiting. What changed:  See the new instructions.   Turmeric 500 MG Caps Take 500 mg by mouth daily.   ULORIC 40 MG tablet Generic drug:  febuxostat TAKE ONE TABLET BY MOUTH ONCE DAILY   Vitamin D-3 5000 units Tabs Take 5,000 Units by mouth daily.            Durable Medical Equipment        Start     Ordered   02/15/16 1711  For home use only DME IV pump/equipment  Once    Comments:  administer 1L lactated ringers at a rate of 250cc/hr for 4 hours daily. IV per Advanced Protocol.   02/15/16 1711   02/15/16 1433  For home use only DME Hospital bed  Once    Question:  Bed type  Answer:  Semi-electric   02/15/16 1433     Follow-up Information    Saguier, Percell Miller, PA-C Follow up.   Specialties:  Internal Medicine, Family Medicine Contact information: [address] [place] [address] [phone]        SHADAD,FIRAS, MD. Schedule an appointment as soon as possible for a visit in 2 day(s).   Specialty:  Oncology Contact information: [address] [place] [address] [phone]          Allergies  Allergen Reactions  . Lipitor [Atorvastatin] Other (See Comments)    Myopathy...severe, with  Myoglobinuria, wheelchair bound for a few days  . Penicillins Anaphylaxis    As a child Has patient had a PCN reaction causing immediate rash, facial/tongue/throat swelling, SOB or lightheadedness with hypotension: yes Has patient had a PCN reaction causing severe rash involving mucus membranes or skin necrosis: yes Has patient had a PCN reaction that required hospitalization: yes Has patient had a PCN reaction occurring within the last 10 years: no If all of the above answers are "NO", then may proceed with Cephalosporin use.   . Prednisone Anxiety and Other (See Comments)    Extremely high blood pressure after taking medication for first time.    Consultations:  Neurosurgery, Dr. Arnoldo Morale  ID, Dr. Linus Salmons  IR, Dr.  Pascal Lux  General surgery, Dr. Zella Richer  Palliative care, Dr. Rowe Pavy  Procedures/Studies: Mr Lumbar Spine W Wo Contrast  Result  Date: 02/04/2016 CLINICAL DATA:  64 y/o M; compression deformity, rule out cord compression. Status post right-sided radical nephrectomy 11/28/2014 EXAM: MRI LUMBAR SPINE WITHOUT AND WITH CONTRAST TECHNIQUE: Multiplanar and multiecho pulse sequences of the lumbar spine were obtained without and with intravenous contrast. CONTRAST:  52mL MULTIHANCE GADOBENATE DIMEGLUMINE 529 MG/ML IV SOLN COMPARISON:  11/16/2015 CT abdomen and pelvis. 09/14/2015 lumbar MRI. FINDINGS: Segmentation:  Standard. Alignment:  Physiologic. Vertebrae: There has been interval resection of the right-sided psoas mass some extension into the superior aspect of the right L2 vertebral body. There is residual enhancement throughout the L1 vertebral body extending into the right pedicle and posterior elements where there is bony expansion. There is severe loss of height of the right aspect of the L1 vertebral body with retropulsion of the vertebral body into the spinal canal in the right central and subarticular zones. And there are multiple additional stable metastatic lesions scattered throughout the visible spine and sacrum. There is severe loss of height of the right aspect of the L1 vertebral body Conus medullaris: Extends to the L1-2 level and appears normal. Paraspinal and other soft tissues: There is a fluid collection within the right psoas muscle extending into the right retroperitoneal space likely related to surgical resection of the mass. Status post right nephrectomy. Stable right adrenal nodule. Left kidney upper pole cyst measuring up to 6.3 cm partially visualized without appreciable enhancing components. Disc levels: T12-L1: Expansion of the right pedicle from neoplasm moderately narrows the right-sided neural foramen. L1-2: Retropulsion of the right aspect of the vertebral body into the right  central and subarticular zone, a disc bulge, and expansion of the right pedicle from neoplasm. Moderate to severe right-sided foraminal narrowing, mild left-sided foraminal narrowing. The retropulsed vertebral body results in moderate to severe canal stenosis on the right with contact upon the conus which is displaced leftward. There is no evidence for compression. L2-3: Small disc bulge and mild facet/ligamentum flavum hypertrophy. Mild bilateral foraminal and lateral recess narrowing. No significant canal stenosis. L3-4: Small disc bulge and mild-to-moderate facet/ligamentum flavum hypertrophy. Mild left-greater-than-right neural foraminal narrowing and lateral recess narrowing. No significant canal stenosis. L4-5: Small disc bulge and moderate bilateral facet/ligamentum flavum hypertrophy greater on the left. Moderate left and mild-to-moderate right foraminal narrowing. Mild canal stenosis. Effacement of lateral recesses with contact upon descending L5 nerve roots. L5-S1: Small disc bulge with moderate facet hypertrophy bilaterally. Moderate bilateral foraminal narrowing. No significant canal stenosis. IMPRESSION: 1. Severe pathologic compression deformity of the right aspect of the L1 vertebral body. Retropulsion of the right aspect of the L1 vertebral body combined with expansile neoplasm in the right-sided pedicle results in moderate to severe canal stenosis with contact upon the conus which is displaced leftward. No evidence for compression. 2. Multiple metastasis visible spine and sacrum. Postsurgical changes related to resection of the psoas mass. 3. No new pathologic compression deformity. 4. Lumbar degenerative changes greatest at L4-5 where there is mild canal stenosis and at L5-S1 where there is moderate bilateral foraminal narrowing. Electronically Signed   By: Kristine Garbe M.D.   On: 02/04/2016 16:59   Ct Abdomen Pelvis W Contrast  Result Date: 02/12/2016 CLINICAL DATA:  Known L1  compression deformity with adjacent bony destruction and findings suggestive of localized infection. EXAM: CT ABDOMEN AND PELVIS WITH CONTRAST TECHNIQUE: Multidetector CT imaging of the abdomen and pelvis was performed using the standard protocol following bolus administration of intravenous contrast. CONTRAST:  168mL ISOVUE-300 COMPARISON:  02/08/2016 FINDINGS: Lower chest: Bilateral  pleural effusions with bibasilar atelectasis noted. Hepatobiliary: Enhancing nodule is again noted in the medial aspect of the right lobe of the liver stable from the prior exam. Stable hypodensities are noted in the inferior aspect of the right lobe of the liver adjacent to the prior nephrectomy site. Postsurgical changes in the liver laterally are noted as well. The gallbladder is well distended with multiple gallstones. No obstructive changes are seen. Pancreas: Unremarkable. No pancreatic ductal dilatation or surrounding inflammatory changes. Spleen: Normal in size without focal abnormality. Adrenals/Urinary Tract: The left adrenal gland and left kidney are stable from the prior exam. A large cyst is noted as well as some renal vascular calcifications. No obstructive changes are seen. The right kidney has been surgically removed. The right adrenal gland appears within normal limits. The bladder is well distended with a Foley catheter in place. Air is noted likely related to the Foley placement. Stomach/Bowel: No obstructive changes are seen. There is some significant wall thickening in the ascending colon adjacent to the area of abnormality within and adjacent to the L1 vertebral body. There remains a considerable of mottled air and fluid density within the L1 vertebral body and extending into the spinal canal as well as into the paraspinal tissues on the right. These changes are intimately involved with the wall thickening in the colon and may be related to localized perforation. There is an apparent area of breakdown of the  medial wall of the ascending colon best seen on image number 36 of series 2. It Is better delineated on today's exam due to the administration of oral contrast. No obstructive changes are seen. No other focal abnormality in the colon is noted. Vascular/Lymphatic: Aortic atherosclerosis. No enlarged abdominal or pelvic lymph nodes. Reproductive: Prostate is unremarkable. Other: No abdominal wall hernia or abnormality. No abdominopelvic ascites. Musculoskeletal: There remain changes as described above in the L1 vertebral body. The degree of mottled air extrinsic to the vertebral body particularly anteriorly behind the aorta has increased. Lytic lesion is noted in the right half of the L2 vertebral body consistent with metastatic disease. This also has a small amount of air within but stable from the prior exam. Air is also noted in the L1-2 disc space. IMPRESSION: Persistent changes of L1 compression deformity with superimposed mottled air and fluid consistent with localized infection. There is irregularity of the medial wall of the ascending colon as described suggestive of contained perforation. This would account for the local spread of apparent infection. Lytic lesion within the L2 vertebral body stable from the prior exam. Stable enhancing lesion in the right hepatic lobe consistent with metastatic disease. Areas of decreased attenuation within the tip of the liver adjacent to the area of abnormality in the colon. This may represent some localized inflammatory change as well. Increased bilateral pleural effusions with bibasilar atelectasis Electronically Signed   By: Inez Catalina M.D.   On: 02/12/2016 13:54   Ct Abdomen Pelvis W Contrast  Addendum Date: 02/08/2016   ADDENDUM REPORT: 02/08/2016 14:44 ADDENDUM: These results will be called to the ordering clinician or representative by the Radiologist Assistant, and communication documented in the PACS or zVision Dashboard. Electronically Signed   By: Marijo Conception, M.D.   On: 02/08/2016 14:44   Result Date: 02/08/2016 CLINICAL DATA:  History of metastatic renal cell carcinoma. Rectal bleeding. EXAM: CT ABDOMEN AND PELVIS WITH CONTRAST TECHNIQUE: Multidetector CT imaging of the abdomen and pelvis was performed using the standard protocol following bolus administration of  intravenous contrast. CONTRAST:  195mL ISOVUE-300 IOPAMIDOL (ISOVUE-300) INJECTION 61% COMPARISON:  CT scan of November 16, 2015.  MRI of October 17, 2014. FINDINGS: Lower chest: Minimal bilateral pleural effusions are noted with adjacent subsegmental atelectasis. Hepatobiliary: Minimal cholelithiasis is noted. Enhancing 17 mm rounded abnormality is seen superiorly in right hepatic lobe concerning for possible metastatic lesion. This appears to be enlarged compared to prior MRI. Status post surgical resection of lesions seen peripherally in right hepatic lobe on prior MRI. Pancreas: Unremarkable. No pancreatic ductal dilatation or surrounding inflammatory changes. Spleen: Normal in size without focal abnormality. Adrenals/Urinary Tract: Adrenal glands are unremarkable. 6.9 cm simple cyst is seen arising from upper pole of left kidney. No hydronephrosis or renal obstruction is noted. Urinary bladder appears normal. Status post right nephrectomy. Stomach/Bowel: There is a large area of inflammation and soft tissue gas which extends from the hepatic flexure medially and into the L1 vertebral body, which demonstrates severe pathologic fracture. This gas appears to communicate with the lumen of the hepatic flexure. This abnormality appears to extend into the inferior portion of right hepatic lobe. There is no evidence of bowel obstruction. Vascular/Lymphatic: Aortic atherosclerosis. No enlarged abdominal or pelvic lymph nodes. Reproductive: Stable mild prostatic enlargement is noted. Other: No hernia is noted. Musculoskeletal: As noted previously, severe wedge compression deformity of L1 vertebral body  is noted secondary to pathologic fracture, with large amount of soft tissue gas within the right-sided the vertebral body and extending in the perispinal region consistent with infection. Lytic destruction of L2 vertebral body is noted concerning for metastatic disease. IMPRESSION: Enhancing 17 mm rounded abnormality noted superiorly in right hepatic lobe concerning for metastatic lesion. This appears to be enlarged compared to prior studies. MRI is recommended for further evaluation. Lytic destruction of L2 vertebral body consistent with metastatic lesion. Large ill-defined area of soft tissue inflammation, fluid and gas is seen extending from hepatic flexure through right perispinal tissues into L1 vertebral body. L1 vertebral body has undergone severe compression deformity secondary to pathologic fracture. This abnormality does appear to extend into the medial and inferior portion of right hepatic lobe. 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 Electronically Signed: By: Marijo Conception, M.D. On: 02/08/2016 14:40    Subjective: Pt sitting up in bed, drunk full ensure this morning, feeling stronger. No chest pain, dyspnea. Back pain controlled.   Discharge Exam: Vitals:   02/16/16 0506 02/16/16 0526  BP: (!) 149/66   Pulse: 72   Resp: 18 16  Temp: 98 F (36.7 C)    Vitals:   02/15/16 2317 02/16/16 0133 02/16/16 0506 02/16/16 0526  BP:   (!) 149/66   Pulse:   72   Resp: 16 20 18 16   Temp:   98 F (36.7 C)   TempSrc:   Oral   SpO2: 98% 97% 100% 99%  Weight:      Height:       General exam: Awake but drowsy, in NAD.  Respiratory system: Nonlabored on room air. No crackles or wheezing Cardiovascular system: Regular rate, s1, s2, do not appreciate murmur. Midline sternal incision scar.  Gastrointestinal system: soft, mild diffuse tenderness without rebound, nondistended, pos BS, RUQ  transverse incision scar. GU: + foley with light yellow urine in bag.  Central nervous system: Diffusely weak without focal deficits, no dysphasia with his short statements. Extremities: No deformities. Poor muscle tone and bulk. Requires 2 person  assist for sitting up for pulmonary exam Skin: normal skin turgor, no bleeding Psychiatry: Appropriately responsive but very slowly.  The results of significant diagnostics from this hospitalization (including imaging, microbiology, ancillary and laboratory) are listed below for reference.    Labs: Basic Metabolic Panel:  Recent Labs Lab 02/10/16 0533 02/11/16 0444 02/12/16 0444 02/14/16 0503  NA 130* 128* 128* 127*  K 3.9 3.2* 3.8 3.7  CL 97* 95* 96* 93*  CO2 26 27 27 29   GLUCOSE 112* 112* 111* 121*  BUN 15 11 11 10   CREATININE 0.90 0.73 0.74 0.68  CALCIUM 7.9* 7.6* 7.5* 7.6*   Liver Function Tests:  Recent Labs Lab 02/14/16 0503  AST 26  ALT 23  ALKPHOS 66  BILITOT 0.7  PROT 5.2*  ALBUMIN 1.9*   CBC:  Recent Labs Lab 02/10/16 0533 02/11/16 0444 02/12/16 0444 02/14/16 0503  WBC 8.6 7.8 7.6 6.8  HGB 7.6* 7.3* 7.8* 8.4*  HCT 22.1* 20.7* 22.8* 24.3*  MCV 92.9 89.2 91.6 92.4  PLT 311 309 347 391   Urinalysis    Component Value Date/Time   COLORURINE YELLOW 02/08/2016 1929   APPEARANCEUR CLEAR 02/08/2016 1929   LABSPEC >1.046 (H) 02/08/2016 1929   PHURINE 5.0 02/08/2016 1929   GLUCOSEU NEGATIVE 02/08/2016 1929   HGBUR SMALL (A) 02/08/2016 1929   BILIRUBINUR NEGATIVE 02/08/2016 1929   KETONESUR 20 (A) 02/08/2016 1929   PROTEINUR NEGATIVE 02/08/2016 1929   UROBILINOGEN 1.0 11/24/2014 1041   NITRITE NEGATIVE 02/08/2016 1929   LEUKOCYTESUR NEGATIVE 02/08/2016 1929   Microbiology Recent Results (from the past 240 hour(s))  Urine culture     Status: Abnormal   Collection Time: 02/08/16  7:29 PM  Result Value Ref Range Status   Specimen Description URINE, CLEAN CATCH  Final   Special Requests NONE  Final    Culture 60,000 COLONIES/mL STAPHYLOCOCCUS HAEMOLYTICUS (A)  Final   Report Status 02/11/2016 FINAL  Final   Organism ID, Bacteria STAPHYLOCOCCUS HAEMOLYTICUS (A)  Final      Susceptibility   Staphylococcus haemolyticus - MIC*    CIPROFLOXACIN <=0.5 SENSITIVE Sensitive     GENTAMICIN <=0.5 SENSITIVE Sensitive     NITROFURANTOIN <=16 SENSITIVE Sensitive     OXACILLIN <=0.25 SENSITIVE Sensitive     TETRACYCLINE >=16 RESISTANT Resistant     VANCOMYCIN <=0.5 SENSITIVE Sensitive     TRIMETH/SULFA <=10 SENSITIVE Sensitive     CLINDAMYCIN <=0.25 SENSITIVE Sensitive     RIFAMPIN <=0.5 SENSITIVE Sensitive     Inducible Clindamycin NEGATIVE Sensitive     * 60,000 COLONIES/mL STAPHYLOCOCCUS HAEMOLYTICUS    Time coordinating discharge: Over 30 minutes  Vance Gather, MD  [facility] 02/16/2016, 11:20 AM Pager [phone]  If 7PM-7AM, please contact night-coverage [email or web address] Password [email or web address]

## 2016-02-16 NOTE — Progress Notes (Signed)
LR prescription faxed to [facility].  [facility] called for transport home.  [name]. Brigitte Pulse, RN

## 2016-02-16 NOTE — Progress Notes (Signed)
Wife states patient has not been on oxygen. Patient's O2 sat on room air is 95-97% with no signs of distress. Patient is non-ambulatory.  [name]. Brigitte Pulse, RN

## 2016-02-16 NOTE — Care Management Note (Signed)
Case Management Note  Patient Details  Name: Dasir Werito MRN: [id number] Date of Birth: 05/03/1950     Expected Discharge Date:             Expected Discharge Plan:  [place]  In-House Referral:  NA  Discharge planning Services  CM Consult  Post Acute Care Choice:  Home Health Choice offered to:  Spouse  DME Arranged:  IV pump/equipment DME Agency:  [place] Arranged:  RN [facility] Agency:  [place]  Status of Service:  Completed, signed off  If discussed at [place] of Stay Meetings, dates discussed:    Additional Comments:  Erenest Rasher, RN 02/16/2016, 12:55 PM

## 2016-02-17 DIAGNOSIS — C649 Malignant neoplasm of unspecified kidney, except renal pelvis: Secondary | ICD-10-CM | POA: Diagnosis not present

## 2016-02-17 DIAGNOSIS — C7951 Secondary malignant neoplasm of bone: Secondary | ICD-10-CM | POA: Diagnosis not present

## 2016-02-17 DIAGNOSIS — M109 Gout, unspecified: Secondary | ICD-10-CM | POA: Diagnosis not present

## 2016-02-17 DIAGNOSIS — M8458XD Pathological fracture in neoplastic disease, other specified site, subsequent encounter for fracture with routine healing: Secondary | ICD-10-CM | POA: Diagnosis not present

## 2016-02-17 DIAGNOSIS — I2510 Atherosclerotic heart disease of native coronary artery without angina pectoris: Secondary | ICD-10-CM | POA: Diagnosis not present

## 2016-02-17 DIAGNOSIS — I10 Essential (primary) hypertension: Secondary | ICD-10-CM | POA: Diagnosis not present

## 2016-02-17 DIAGNOSIS — Z452 Encounter for adjustment and management of vascular access device: Secondary | ICD-10-CM | POA: Diagnosis not present

## 2016-02-17 DIAGNOSIS — Z951 Presence of aortocoronary bypass graft: Secondary | ICD-10-CM | POA: Diagnosis not present

## 2016-02-18 ENCOUNTER — Telehealth: Payer: Self-pay

## 2016-02-18 ENCOUNTER — Telehealth: Payer: Self-pay | Admitting: *Deleted

## 2016-02-18 ENCOUNTER — Telehealth: Payer: Self-pay | Admitting: Medical

## 2016-02-18 DIAGNOSIS — C7951 Secondary malignant neoplasm of bone: Secondary | ICD-10-CM | POA: Diagnosis not present

## 2016-02-18 DIAGNOSIS — I2510 Atherosclerotic heart disease of native coronary artery without angina pectoris: Secondary | ICD-10-CM | POA: Diagnosis not present

## 2016-02-18 DIAGNOSIS — I10 Essential (primary) hypertension: Secondary | ICD-10-CM | POA: Diagnosis not present

## 2016-02-18 DIAGNOSIS — M8458XD Pathological fracture in neoplastic disease, other specified site, subsequent encounter for fracture with routine healing: Secondary | ICD-10-CM | POA: Diagnosis not present

## 2016-02-18 DIAGNOSIS — C649 Malignant neoplasm of unspecified kidney, except renal pelvis: Secondary | ICD-10-CM | POA: Diagnosis not present

## 2016-02-18 DIAGNOSIS — Z452 Encounter for adjustment and management of vascular access device: Secondary | ICD-10-CM | POA: Diagnosis not present

## 2016-02-18 MED FILL — HYDROMORPHONE 5 MG/5 ML SOL: 1 | 2 days supply | Qty: 56 | Fill #0

## 2016-02-18 NOTE — Telephone Encounter (Signed)
Transition Care Management Follow-up Telephone Call  ADMISSION DATE: 02/08/16  DISCHARGE DATE: 12/16/*17   How have you been since you were released from the hospital?Spoke with patients daughter in law. States patient is not doing well.    Do you understand why you were in the hospital? Family explained to patient why he was in hospital    Do you understand the discharge instrcutions? Family is unclear of the patients medication list.  Items Reviewed:  Medications reviewed:  Family has questions regarding meds. Not taking some of the medication on dischrge list    Alleries reviewed: Lipitor,PCN, Prednisone,  Dietary changes reviewed  Patient only able to tolerate endsure    Referrals reviewed: Per family,patient not able to go to follow up visits.      Functional Questionnaire:   Activities of Daily Living (ADLs): Assistance needed   Any transportation issues/concerns?: Assistance needeNo  Any patient concerns? Medications   Confirmed importance and date/time of follow-up visits scheduled: Yes   Confirmed with patient if condition begins to worsen call PCP or go to the ER. Confirmed with daughter in-law    Patient was offered the [facility]  Phone number [phone] daughter inlaw number, states they use [place].

## 2016-02-18 NOTE — Telephone Encounter (Signed)
Please prior note. The hospital dc summary mentioned that they recommended hospice. Please see the 02-16-2016 dc summary note. If they can't come explain to family will write order for hospice/palliative care. Review that note. I have only one time for gout but have reviewed specialist note sent to me. If they don't want me to write order then attend appointment with Dr. Alen [name]. But sounds like getting even to that appointment will be challenge. Sad situation. Let me know what they say.

## 2016-02-18 NOTE — Telephone Encounter (Signed)
Spoke with wife bobbie Contino, patient d/c'd from hospital yesterday, very weak. per dr Alen Blew, will cancel appt for lab/dr/chemo on 02/19/16. Do not take the cabometyx, keep regularly scheduled appt for 03/04/16 for lab/dr/chemo. Will discuss oral chemo med then.

## 2016-02-18 NOTE — Telephone Encounter (Signed)
Called phone number for patient. Daughter in law states is unable to talk on phone st this time. States patient has not been doing well since discharge from hospital.Family had to explain to patient why he was hospitalized. Upon discharge family was unclear as to what medications patient should not be taking  Family state Chemotherapy will be stopped. Patient is unable to perform ADL's without assistance. Patient is only able to tolerate Ensure nourishment. If patient goes to physician  He will have to go by stretcher. [facility] coning too home daily to provide care.

## 2016-02-19 ENCOUNTER — Other Ambulatory Visit: Payer: Medicare Other

## 2016-02-19 ENCOUNTER — Other Ambulatory Visit (HOSPITAL_COMMUNITY): Payer: Self-pay | Admitting: Interventional Radiology

## 2016-02-19 ENCOUNTER — Ambulatory Visit: Payer: Medicare Other | Admitting: Oncology

## 2016-02-19 ENCOUNTER — Ambulatory Visit: Payer: Medicare Other

## 2016-02-19 DIAGNOSIS — I10 Essential (primary) hypertension: Secondary | ICD-10-CM | POA: Diagnosis not present

## 2016-02-19 DIAGNOSIS — Z452 Encounter for adjustment and management of vascular access device: Secondary | ICD-10-CM | POA: Diagnosis not present

## 2016-02-19 DIAGNOSIS — C649 Malignant neoplasm of unspecified kidney, except renal pelvis: Secondary | ICD-10-CM | POA: Diagnosis not present

## 2016-02-19 DIAGNOSIS — I2510 Atherosclerotic heart disease of native coronary artery without angina pectoris: Secondary | ICD-10-CM | POA: Diagnosis not present

## 2016-02-19 DIAGNOSIS — M8458XD Pathological fracture in neoplastic disease, other specified site, subsequent encounter for fracture with routine healing: Secondary | ICD-10-CM | POA: Diagnosis not present

## 2016-02-19 DIAGNOSIS — C7951 Secondary malignant neoplasm of bone: Secondary | ICD-10-CM | POA: Diagnosis not present

## 2016-02-19 NOTE — Telephone Encounter (Signed)
Called to follow up with family.  Wife answered the phone. She was pressed for time as she said they were busy.  Husband is not doing well.  States Dr. Alen Blew cancelled their appointments for this month, but have rescheduled them for January. They plan to keep appts as scheduled. Wife states they are definitely not able to come in and see Derek [name] at this time.  Pt is just too weak.  She said they were fine at this time, had everything they needed.  Wife not at a place to discuss hospice/palliative care at this time.

## 2016-02-20 ENCOUNTER — Other Ambulatory Visit (HOSPITAL_COMMUNITY): Payer: Self-pay | Admitting: Interventional Radiology

## 2016-02-20 ENCOUNTER — Other Ambulatory Visit: Payer: Self-pay | Admitting: Radiology

## 2016-02-20 DIAGNOSIS — C7949 Secondary malignant neoplasm of other parts of nervous system: Secondary | ICD-10-CM

## 2016-02-20 DIAGNOSIS — C641 Malignant neoplasm of right kidney, except renal pelvis: Secondary | ICD-10-CM

## 2016-02-20 DIAGNOSIS — C799 Secondary malignant neoplasm of unspecified site: Secondary | ICD-10-CM

## 2016-02-21 ENCOUNTER — Encounter: Payer: Self-pay | Admitting: *Deleted

## 2016-02-21 ENCOUNTER — Telehealth: Payer: Self-pay | Admitting: *Deleted

## 2016-02-21 ENCOUNTER — Ambulatory Visit (HOSPITAL_COMMUNITY)
Admission: RE | Admit: 2016-02-21 | Discharge: 2016-02-21 | Disposition: A | Discharge status: Home / Self Care | Payer: Medicare Other | Source: Ambulatory Visit | Attending: Interventional Radiology | Admitting: Interventional Radiology

## 2016-02-21 ENCOUNTER — Other Ambulatory Visit: Payer: Self-pay | Admitting: *Deleted

## 2016-02-21 DIAGNOSIS — C7951 Secondary malignant neoplasm of bone: Secondary | ICD-10-CM | POA: Diagnosis not present

## 2016-02-21 DIAGNOSIS — I10 Essential (primary) hypertension: Secondary | ICD-10-CM | POA: Diagnosis not present

## 2016-02-21 DIAGNOSIS — C799 Secondary malignant neoplasm of unspecified site: Secondary | ICD-10-CM

## 2016-02-21 DIAGNOSIS — C641 Malignant neoplasm of right kidney, except renal pelvis: Secondary | ICD-10-CM

## 2016-02-21 DIAGNOSIS — Z452 Encounter for adjustment and management of vascular access device: Secondary | ICD-10-CM | POA: Diagnosis not present

## 2016-02-21 DIAGNOSIS — C649 Malignant neoplasm of unspecified kidney, except renal pelvis: Secondary | ICD-10-CM | POA: Diagnosis not present

## 2016-02-21 DIAGNOSIS — C7949 Secondary malignant neoplasm of other parts of nervous system: Secondary | ICD-10-CM

## 2016-02-21 DIAGNOSIS — M545 Low back pain: Secondary | ICD-10-CM | POA: Diagnosis not present

## 2016-02-21 DIAGNOSIS — M8458XD Pathological fracture in neoplastic disease, other specified site, subsequent encounter for fracture with routine healing: Secondary | ICD-10-CM | POA: Diagnosis not present

## 2016-02-21 DIAGNOSIS — I2510 Atherosclerotic heart disease of native coronary artery without angina pectoris: Secondary | ICD-10-CM | POA: Diagnosis not present

## 2016-02-21 MED ORDER — HYDROMORPHONE HCL 4 MG PO TABS: ORAL_TABLET | ORAL | 0 refills | Status: AC

## 2016-02-21 NOTE — Telephone Encounter (Signed)
I have no objection.

## 2016-02-21 NOTE — Telephone Encounter (Signed)
Spoke with wife bobbie, dr Alen Blew has no objection to a PICC line.

## 2016-02-21 NOTE — Telephone Encounter (Signed)
Spoke with patient and son. Requesting refill of dilaudid 4 mg tablets, before the holidays get here. does not want liquid.

## 2016-02-21 NOTE — Telephone Encounter (Signed)
Ok to refill 

## 2016-02-21 NOTE — Telephone Encounter (Signed)
Script for dilaudid left at front for p/u. Wife bobbie notified.

## 2016-02-21 NOTE — Telephone Encounter (Signed)
Coretta from [facility] called to report they have orders from Hospitalist for IVFs (Lactated Ringers IV over 4 hrs daily through this Saturday).   [facility] was told to contact Dr. Alen Blew for any further orders after this Saturday.  If Dr. Alen Blew wants to continue IVFs at home please have nurse either call new order to [facility] at [phone] or fax order to them at [phone].

## 2016-02-21 NOTE — Telephone Encounter (Signed)
rec'd call from patient's wife. They just left dr deveshwar's office. Dr Estanislado Pandy  suggested placing a PICC line for TPN tomorrow while patient is  in IR. If dr Alen Blew is in agreement.

## 2016-02-22 ENCOUNTER — Ambulatory Visit (HOSPITAL_COMMUNITY): Admission: RE | Admit: 2016-02-22 | Payer: Medicare Other | Source: Ambulatory Visit

## 2016-02-22 ENCOUNTER — Encounter (HOSPITAL_COMMUNITY): Payer: Self-pay

## 2016-02-22 ENCOUNTER — Encounter (HOSPITAL_COMMUNITY): Payer: Self-pay | Admitting: Interventional Radiology

## 2016-02-22 ENCOUNTER — Other Ambulatory Visit: Payer: Self-pay | Admitting: Oncology

## 2016-02-22 DIAGNOSIS — C649 Malignant neoplasm of unspecified kidney, except renal pelvis: Secondary | ICD-10-CM

## 2016-02-22 DIAGNOSIS — C7949 Secondary malignant neoplasm of other parts of nervous system: Secondary | ICD-10-CM

## 2016-02-22 HISTORY — PX: IR GENERIC HISTORICAL: IMG1180011

## 2016-02-25 DIAGNOSIS — C649 Malignant neoplasm of unspecified kidney, except renal pelvis: Secondary | ICD-10-CM | POA: Diagnosis not present

## 2016-02-25 DIAGNOSIS — I10 Essential (primary) hypertension: Secondary | ICD-10-CM | POA: Diagnosis not present

## 2016-02-25 DIAGNOSIS — C7951 Secondary malignant neoplasm of bone: Secondary | ICD-10-CM | POA: Diagnosis not present

## 2016-02-25 DIAGNOSIS — M8458XD Pathological fracture in neoplastic disease, other specified site, subsequent encounter for fracture with routine healing: Secondary | ICD-10-CM | POA: Diagnosis not present

## 2016-02-25 DIAGNOSIS — Z452 Encounter for adjustment and management of vascular access device: Secondary | ICD-10-CM | POA: Diagnosis not present

## 2016-02-25 DIAGNOSIS — I2510 Atherosclerotic heart disease of native coronary artery without angina pectoris: Secondary | ICD-10-CM | POA: Diagnosis not present

## 2016-02-26 ENCOUNTER — Telehealth: Payer: Self-pay | Admitting: *Deleted

## 2016-02-26 ENCOUNTER — Other Ambulatory Visit: Payer: Self-pay | Admitting: *Deleted

## 2016-02-26 DIAGNOSIS — C649 Malignant neoplasm of unspecified kidney, except renal pelvis: Secondary | ICD-10-CM

## 2016-02-26 NOTE — Telephone Encounter (Signed)
As noted by Dr. Alen Blew, I called [facility]) to set up PICC line flushes and to start TPN. Patient is to get PICC placed tomorrow, 02/27/16, at [facility]. I talked with Santiago Glad at [facility]. Her number is [phone].

## 2016-02-26 NOTE — Progress Notes (Signed)
[facility] pt for [facility] from 02-22-16 originally for IV hydration fluids now needing start of TPN at home. Discussed needs and orders with Amelia Jo, RN for Dr. Alen Blew.  Pt to have PICC place on 02-27-16 at 8 AM in [place]. Pt will then return for scan around 66-230PM. [facility] will plan for PM [facility] to initiate TPN at home on 02-27-16.  If patient discharges after hours, please call [phone].   Larry Sierras 02/26/2016, 4:54 PM

## 2016-02-27 ENCOUNTER — Other Ambulatory Visit: Payer: Self-pay | Admitting: Oncology

## 2016-02-27 ENCOUNTER — Other Ambulatory Visit: Payer: Self-pay | Admitting: *Deleted

## 2016-02-27 ENCOUNTER — Ambulatory Visit (HOSPITAL_COMMUNITY)
Admission: RE | Admit: 2016-02-27 | Discharge: 2016-02-27 | Disposition: A | Discharge status: Home / Self Care | Payer: Medicare Other | Source: Ambulatory Visit | Attending: Oncology | Admitting: Oncology

## 2016-02-27 ENCOUNTER — Encounter (HOSPITAL_COMMUNITY): Payer: Self-pay | Admitting: Interventional Radiology

## 2016-02-27 ENCOUNTER — Encounter: Payer: Self-pay | Admitting: Oncology

## 2016-02-27 DIAGNOSIS — K632 Fistula of intestine: Secondary | ICD-10-CM | POA: Diagnosis not present

## 2016-02-27 DIAGNOSIS — Z905 Acquired absence of kidney: Secondary | ICD-10-CM | POA: Diagnosis not present

## 2016-02-27 DIAGNOSIS — C649 Malignant neoplasm of unspecified kidney, except renal pelvis: Secondary | ICD-10-CM

## 2016-02-27 DIAGNOSIS — C7949 Secondary malignant neoplasm of other parts of nervous system: Secondary | ICD-10-CM

## 2016-02-27 DIAGNOSIS — K631 Perforation of intestine (nontraumatic): Secondary | ICD-10-CM | POA: Insufficient documentation

## 2016-02-27 DIAGNOSIS — C7951 Secondary malignant neoplasm of bone: Secondary | ICD-10-CM | POA: Diagnosis not present

## 2016-02-27 DIAGNOSIS — M4856XD Collapsed vertebra, not elsewhere classified, lumbar region, subsequent encounter for fracture with routine healing: Secondary | ICD-10-CM | POA: Diagnosis not present

## 2016-02-27 DIAGNOSIS — E279 Disorder of adrenal gland, unspecified: Secondary | ICD-10-CM | POA: Diagnosis not present

## 2016-02-27 DIAGNOSIS — K922 Gastrointestinal hemorrhage, unspecified: Secondary | ICD-10-CM | POA: Insufficient documentation

## 2016-02-27 DIAGNOSIS — J90 Pleural effusion, not elsewhere classified: Secondary | ICD-10-CM | POA: Diagnosis not present

## 2016-02-27 DIAGNOSIS — S329XXA Fracture of unspecified parts of lumbosacral spine and pelvis, initial encounter for closed fracture: Secondary | ICD-10-CM | POA: Diagnosis not present

## 2016-02-27 DIAGNOSIS — I2510 Atherosclerotic heart disease of native coronary artery without angina pectoris: Secondary | ICD-10-CM | POA: Diagnosis not present

## 2016-02-27 DIAGNOSIS — I10 Essential (primary) hypertension: Secondary | ICD-10-CM | POA: Diagnosis not present

## 2016-02-27 DIAGNOSIS — R531 Weakness: Secondary | ICD-10-CM | POA: Diagnosis not present

## 2016-02-27 DIAGNOSIS — M8458XD Pathological fracture in neoplastic disease, other specified site, subsequent encounter for fracture with routine healing: Secondary | ICD-10-CM | POA: Diagnosis not present

## 2016-02-27 DIAGNOSIS — Z452 Encounter for adjustment and management of vascular access device: Secondary | ICD-10-CM | POA: Diagnosis not present

## 2016-02-27 HISTORY — PX: IR GENERIC HISTORICAL: IMG1180011

## 2016-02-27 MED ORDER — LIDOCAINE HCL 1 % IJ SOLN
INTRAMUSCULAR | Status: DC | PRN
Start: 2016-02-27 — End: 2016-02-28
  Administered 2016-02-27: 5 mL

## 2016-02-27 MED ORDER — LIDOCAINE HCL 1 % IJ SOLN
INTRAMUSCULAR | Status: AC
  Filled 2016-02-27: qty 20

## 2016-02-27 MED ORDER — HEPARIN SOD (PORK) LOCK FLUSH 100 UNIT/ML IV SOLN
INTRAVENOUS | Status: AC
  Filled 2016-02-27: qty 5

## 2016-02-27 MED ORDER — IOPAMIDOL (ISOVUE-300) INJECTION 61%
100.0000 mL | Freq: Once | INTRAVENOUS | Status: AC | PRN
  Administered 2016-02-27: 100 mL via INTRAVENOUS

## 2016-02-27 MED ORDER — IOPAMIDOL (ISOVUE-300) INJECTION 61%
INTRAVENOUS | Status: AC
  Filled 2016-02-27: qty 100

## 2016-02-27 MED ORDER — IOPAMIDOL (ISOVUE-300) INJECTION 61%
30.0000 mL | Freq: Once | INTRAVENOUS | Status: AC | PRN
  Administered 2016-02-27: 30 mL via ORAL

## 2016-02-27 MED ORDER — IOPAMIDOL (ISOVUE-300) INJECTION 61%
INTRAVENOUS | Status: AC
  Administered 2016-02-27: 30 mL via ORAL
  Filled 2016-02-27: qty 30

## 2016-02-27 NOTE — Procedures (Signed)
R arm PowerPICC placed under US and fluoroscopy No ptx on spot chest radiograph. No complication No blood loss. See complete dictation in [facility] PACS.  

## 2016-02-27 NOTE — Progress Notes (Signed)
[name] his clinical status was reviewed and was discussed with his family since his discharge from the hospital. He underwent a repeat imaging studies on 02/27/2016 which did not show any dramatic changes. 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  His nutritional status remains poor with a low albumin of 1.9 and 3 albumin of less than 5. 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 Based on these findings, I have discussed the risks and benefits of TNA and he is agreeable to proceed. PICC line was inserted on 02/27/2016 and TNA to start in the near future.  The plan is to proceed with this approach for a period of time and assess her surgical candidacy after 1-2 months at most. The purpose of surgery would be to repair his compression fracture as well which is to be done for palliative purposes and to alleviate his pain.  He has a medical oncology follow up on 03/04/2016 to assess his clinical status at that time.

## 2016-02-28 ENCOUNTER — Encounter: Payer: Self-pay | Admitting: *Deleted

## 2016-02-29 ENCOUNTER — Other Ambulatory Visit (HOSPITAL_COMMUNITY): Payer: Medicare Other

## 2016-02-29 ENCOUNTER — Other Ambulatory Visit: Payer: Self-pay | Admitting: *Deleted

## 2016-02-29 ENCOUNTER — Telehealth: Payer: Self-pay | Admitting: *Deleted

## 2016-02-29 ENCOUNTER — Other Ambulatory Visit (HOSPITAL_COMMUNITY): Payer: Self-pay | Admitting: Family Medicine

## 2016-02-29 DIAGNOSIS — I2510 Atherosclerotic heart disease of native coronary artery without angina pectoris: Secondary | ICD-10-CM | POA: Diagnosis not present

## 2016-02-29 DIAGNOSIS — I10 Essential (primary) hypertension: Secondary | ICD-10-CM | POA: Diagnosis not present

## 2016-02-29 DIAGNOSIS — C7951 Secondary malignant neoplasm of bone: Secondary | ICD-10-CM | POA: Diagnosis not present

## 2016-02-29 DIAGNOSIS — M8458XD Pathological fracture in neoplastic disease, other specified site, subsequent encounter for fracture with routine healing: Secondary | ICD-10-CM | POA: Diagnosis not present

## 2016-02-29 DIAGNOSIS — E785 Hyperlipidemia, unspecified: Secondary | ICD-10-CM | POA: Diagnosis not present

## 2016-02-29 DIAGNOSIS — Z452 Encounter for adjustment and management of vascular access device: Secondary | ICD-10-CM | POA: Diagnosis not present

## 2016-02-29 DIAGNOSIS — Z0000 Encounter for general adult medical examination without abnormal findings: Secondary | ICD-10-CM | POA: Diagnosis not present

## 2016-02-29 DIAGNOSIS — C649 Malignant neoplasm of unspecified kidney, except renal pelvis: Secondary | ICD-10-CM | POA: Diagnosis not present

## 2016-02-29 MED ORDER — FENTANYL 25 MCG/HR TD PT72: 25.0000 ug | MEDICATED_PATCH | TRANSDERMAL | 0 refills | Status: AC

## 2016-02-29 MED FILL — fentaNYL 25 MCG/HR PT72: 25 | 30 days supply | Qty: 10 | Fill #0

## 2016-02-29 NOTE — Telephone Encounter (Signed)
Wife calling for refill script of fentanyl 25 mcg patches. Signed script left at front for patient p/u.

## 2016-03-04 ENCOUNTER — Ambulatory Visit: Payer: Medicare Other | Admitting: Oncology

## 2016-03-04 ENCOUNTER — Other Ambulatory Visit: Payer: Medicare Other

## 2016-03-04 ENCOUNTER — Ambulatory Visit: Payer: Medicare Other

## 2016-03-04 DIAGNOSIS — M4626 Osteomyelitis of vertebra, lumbar region: Secondary | ICD-10-CM | POA: Diagnosis present

## 2016-03-04 DIAGNOSIS — C649 Malignant neoplasm of unspecified kidney, except renal pelvis: Secondary | ICD-10-CM | POA: Diagnosis not present

## 2016-03-04 DIAGNOSIS — T451X5A Adverse effect of antineoplastic and immunosuppressive drugs, initial encounter: Secondary | ICD-10-CM | POA: Diagnosis present

## 2016-03-04 DIAGNOSIS — M8458XA Pathological fracture in neoplastic disease, other specified site, initial encounter for fracture: Secondary | ICD-10-CM | POA: Diagnosis present

## 2016-03-04 DIAGNOSIS — R410 Disorientation, unspecified: Secondary | ICD-10-CM | POA: Diagnosis not present

## 2016-03-04 DIAGNOSIS — C641 Malignant neoplasm of right kidney, except renal pelvis: Secondary | ICD-10-CM | POA: Diagnosis present

## 2016-03-04 DIAGNOSIS — C801 Malignant (primary) neoplasm, unspecified: Secondary | ICD-10-CM | POA: Diagnosis not present

## 2016-03-04 DIAGNOSIS — R451 Restlessness and agitation: Secondary | ICD-10-CM | POA: Diagnosis not present

## 2016-03-04 DIAGNOSIS — Z7401 Bed confinement status: Secondary | ICD-10-CM | POA: Diagnosis not present

## 2016-03-04 DIAGNOSIS — C7951 Secondary malignant neoplasm of bone: Secondary | ICD-10-CM | POA: Diagnosis present

## 2016-03-04 DIAGNOSIS — Z85528 Personal history of other malignant neoplasm of kidney: Secondary | ICD-10-CM | POA: Diagnosis not present

## 2016-03-04 DIAGNOSIS — Z888 Allergy status to other drugs, medicaments and biological substances status: Secondary | ICD-10-CM | POA: Diagnosis not present

## 2016-03-04 DIAGNOSIS — R531 Weakness: Secondary | ICD-10-CM | POA: Diagnosis not present

## 2016-03-04 DIAGNOSIS — M4640 Discitis, unspecified, site unspecified: Secondary | ICD-10-CM | POA: Diagnosis present

## 2016-03-04 DIAGNOSIS — M869 Osteomyelitis, unspecified: Secondary | ICD-10-CM | POA: Diagnosis not present

## 2016-03-04 DIAGNOSIS — K6812 Psoas muscle abscess: Secondary | ICD-10-CM | POA: Diagnosis not present

## 2016-03-04 DIAGNOSIS — B370 Candidal stomatitis: Secondary | ICD-10-CM | POA: Diagnosis not present

## 2016-03-04 DIAGNOSIS — Z66 Do not resuscitate: Secondary | ICD-10-CM | POA: Diagnosis not present

## 2016-03-04 DIAGNOSIS — G834 Cauda equina syndrome: Secondary | ICD-10-CM | POA: Diagnosis present

## 2016-03-04 DIAGNOSIS — I10 Essential (primary) hypertension: Secondary | ICD-10-CM | POA: Diagnosis present

## 2016-03-04 DIAGNOSIS — M545 Low back pain: Secondary | ICD-10-CM | POA: Diagnosis not present

## 2016-03-04 DIAGNOSIS — K632 Fistula of intestine: Secondary | ICD-10-CM | POA: Diagnosis present

## 2016-03-04 DIAGNOSIS — Z79899 Other long term (current) drug therapy: Secondary | ICD-10-CM | POA: Diagnosis not present

## 2016-03-04 DIAGNOSIS — G893 Neoplasm related pain (acute) (chronic): Secondary | ICD-10-CM | POA: Diagnosis present

## 2016-03-04 DIAGNOSIS — Z515 Encounter for palliative care: Secondary | ICD-10-CM | POA: Diagnosis not present

## 2016-03-04 DIAGNOSIS — F419 Anxiety disorder, unspecified: Secondary | ICD-10-CM | POA: Diagnosis not present

## 2016-03-04 DIAGNOSIS — B9562 Methicillin resistant Staphylococcus aureus infection as the cause of diseases classified elsewhere: Secondary | ICD-10-CM | POA: Diagnosis not present

## 2016-03-04 DIAGNOSIS — M546 Pain in thoracic spine: Secondary | ICD-10-CM | POA: Diagnosis not present

## 2016-03-04 DIAGNOSIS — Z959 Presence of cardiac and vascular implant and graft, unspecified: Secondary | ICD-10-CM | POA: Diagnosis not present

## 2016-03-04 DIAGNOSIS — R7881 Bacteremia: Secondary | ICD-10-CM | POA: Diagnosis present

## 2016-03-04 DIAGNOSIS — R52 Pain, unspecified: Secondary | ICD-10-CM | POA: Diagnosis not present

## 2016-03-04 DIAGNOSIS — R32 Unspecified urinary incontinence: Secondary | ICD-10-CM | POA: Diagnosis present

## 2016-03-04 DIAGNOSIS — D649 Anemia, unspecified: Secondary | ICD-10-CM | POA: Diagnosis not present

## 2016-03-04 DIAGNOSIS — D6481 Anemia due to antineoplastic chemotherapy: Secondary | ICD-10-CM | POA: Diagnosis present

## 2016-03-04 DIAGNOSIS — Z9221 Personal history of antineoplastic chemotherapy: Secondary | ICD-10-CM | POA: Diagnosis not present

## 2016-03-04 DIAGNOSIS — C787 Secondary malignant neoplasm of liver and intrahepatic bile duct: Secondary | ICD-10-CM | POA: Diagnosis present

## 2016-03-04 DIAGNOSIS — Z880 Allergy status to penicillin: Secondary | ICD-10-CM | POA: Diagnosis not present

## 2016-03-04 DIAGNOSIS — I252 Old myocardial infarction: Secondary | ICD-10-CM | POA: Diagnosis not present

## 2016-03-04 DIAGNOSIS — M4625 Osteomyelitis of vertebra, thoracolumbar region: Secondary | ICD-10-CM | POA: Diagnosis present

## 2016-03-04 DIAGNOSIS — M898X9 Other specified disorders of bone, unspecified site: Secondary | ICD-10-CM | POA: Diagnosis not present

## 2016-03-04 DIAGNOSIS — Z960 Presence of urogenital implants: Secondary | ICD-10-CM | POA: Diagnosis not present

## 2016-03-11 ENCOUNTER — Encounter: Payer: Self-pay | Admitting: *Deleted

## 2016-03-12 ENCOUNTER — Telehealth: Payer: Self-pay | Admitting: Medical

## 2016-03-12 NOTE — Telephone Encounter (Signed)
(  Derek [name]) declined medicare wellness appointment stating "patient is dying" currently in hospice and to take he's name off all list.

## 2016-03-28 ENCOUNTER — Telehealth: Payer: Self-pay | Admitting: Medical

## 2016-03-28 NOTE — Telephone Encounter (Signed)
Patient passed away on 2016-04-05.

## 2016-04-03 DEATH — deceased

## 2016-06-10 NOTE — Telephone Encounter (Signed)
Error

## 2016-06-16 ENCOUNTER — Other Ambulatory Visit: Payer: Self-pay | Admitting: Nurse Practitioner

## 2018-04-11 IMAGING — CT CT ABD-PELV W/O CM
2 of 4 series · 15 of 46 positions shown, 17 images · non-contrast
Comparison: CT chest, abdomen and pelvis 08/21/2015 and 05/11/2015.

CLINICAL DATA: Severe low back pain for 1 week. History of right
nephrectomy for renal carcinoma.

EXAM:
CT ABDOMEN AND PELVIS WITHOUT CONTRAST
TECHNIQUE: Multidetector CT imaging of the abdomen and pelvis was performed
following the standard protocol without IV contrast.

[Series 2: axial st · axial · 0.95mm/px · z∈[+770,+1246]mm · 12 of 105 slices shown, 14 images]
[im 5/105  soft-tissue]
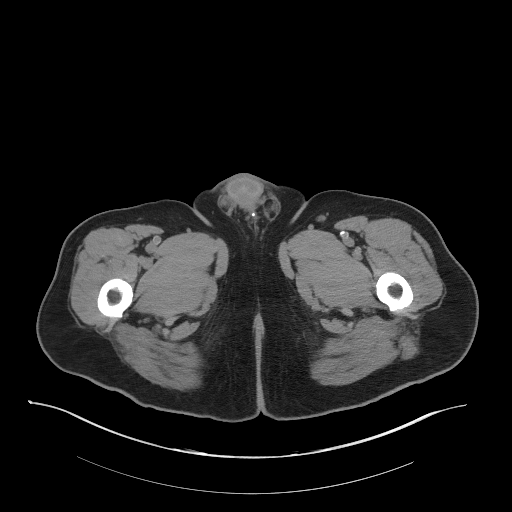
[im 5/105  bone]
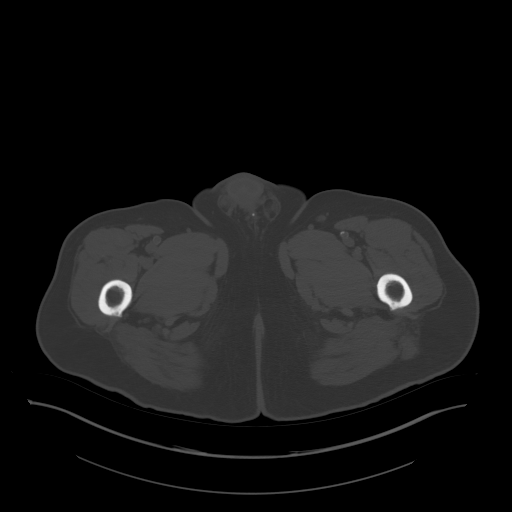
[im 14/105  soft-tissue]
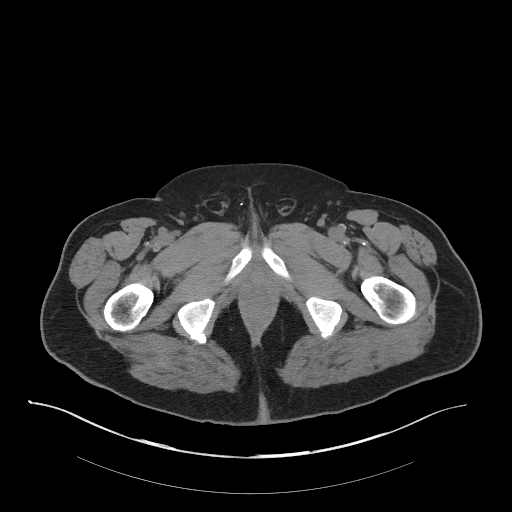
[im 23/105  soft-tissue]
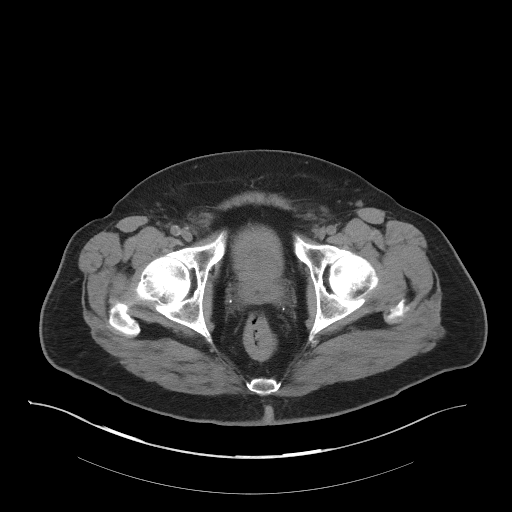
[im 32/105  soft-tissue]
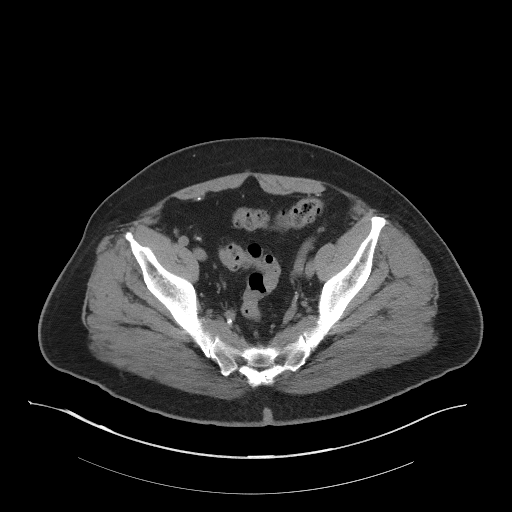
[im 41/105  soft-tissue]
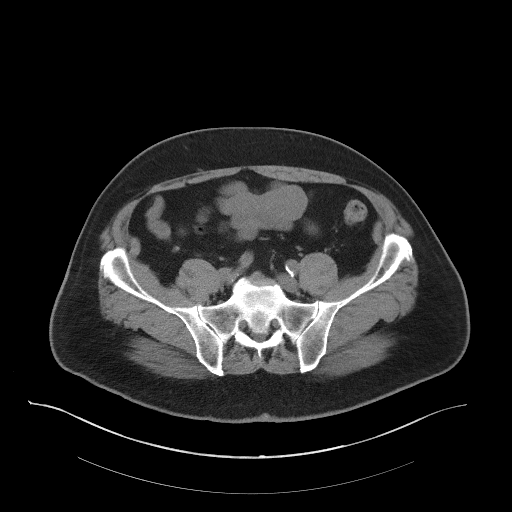
[im 50/105  soft-tissue]
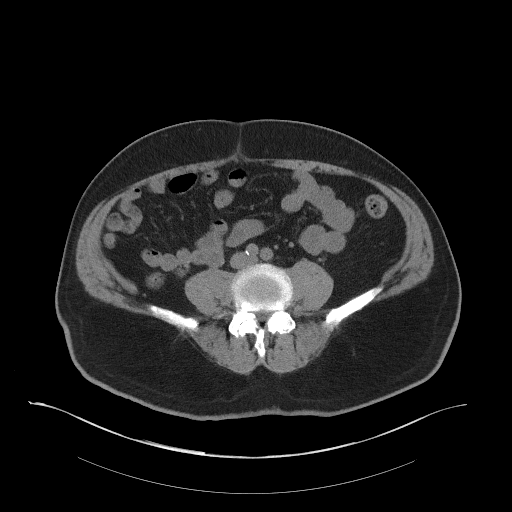
[im 55/105  soft-tissue]
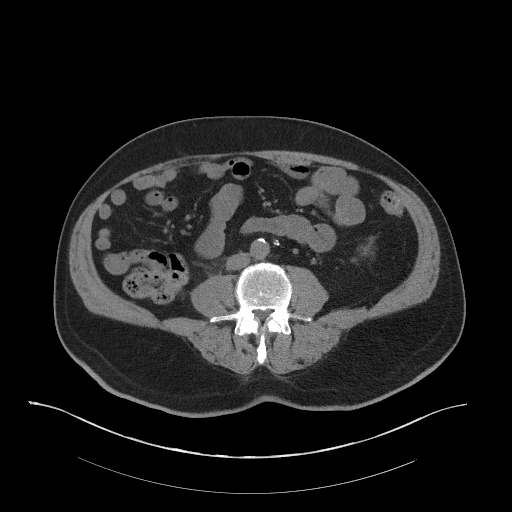
[im 64/105  soft-tissue]
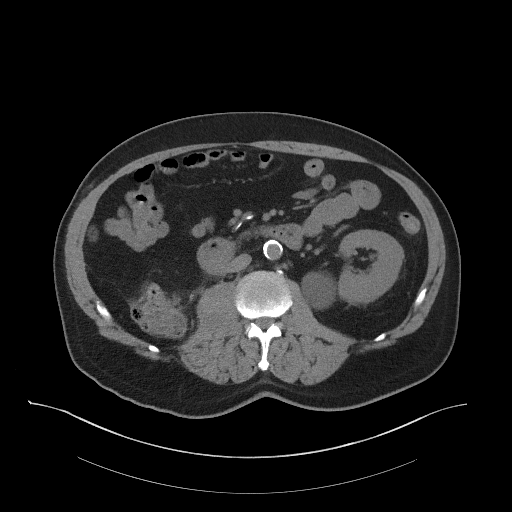
[im 73/105  soft-tissue]
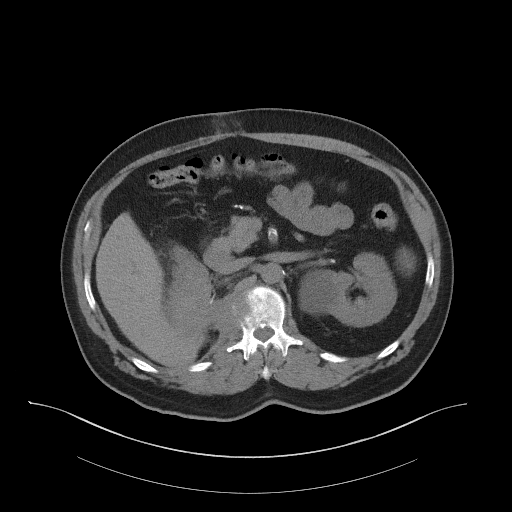
[im 73/105  bone]
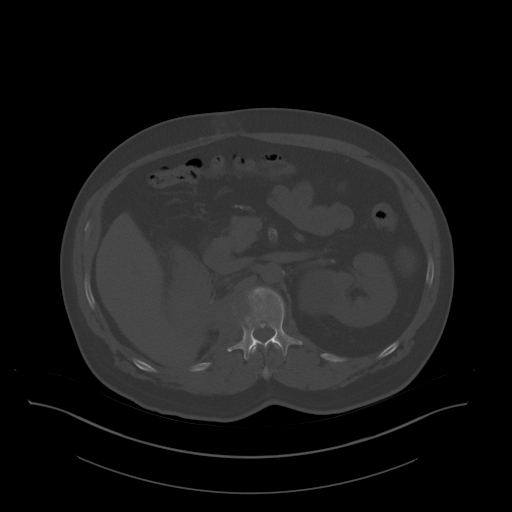
[im 82/105  soft-tissue]
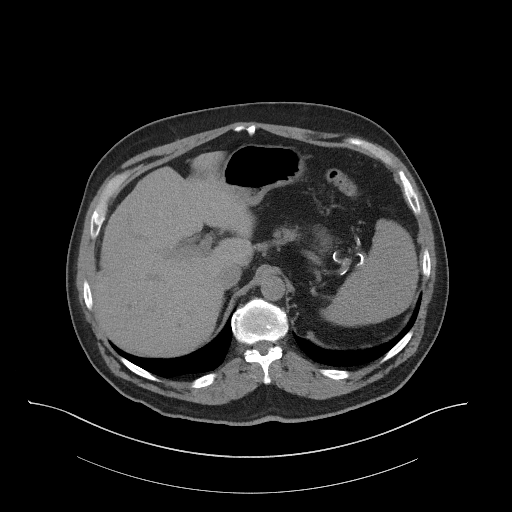
[im 91/105  soft-tissue]
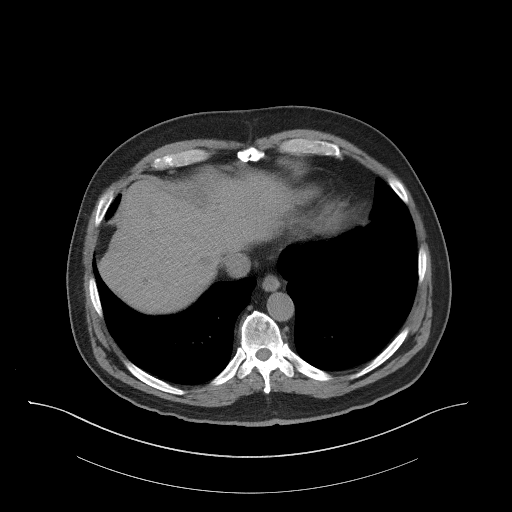
[im 100/105  soft-tissue]
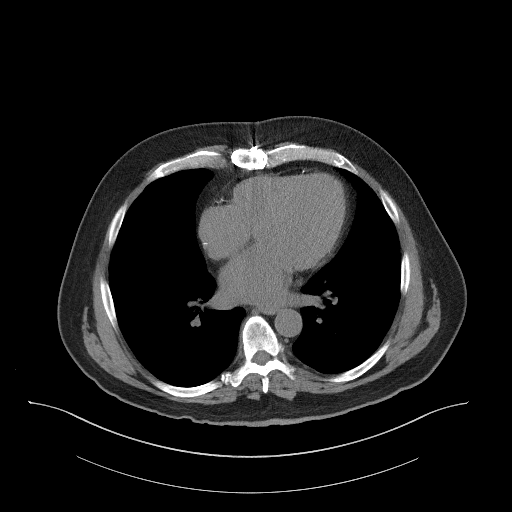

[Series 5: coronal st · coronal · 0.82mm/px · 3 of 105 slices shown]
[im 35/105  soft-tissue]
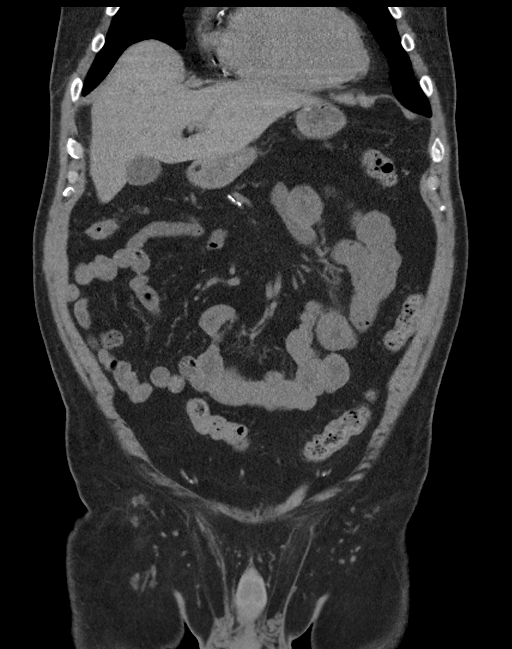
[im 47/105  soft-tissue]
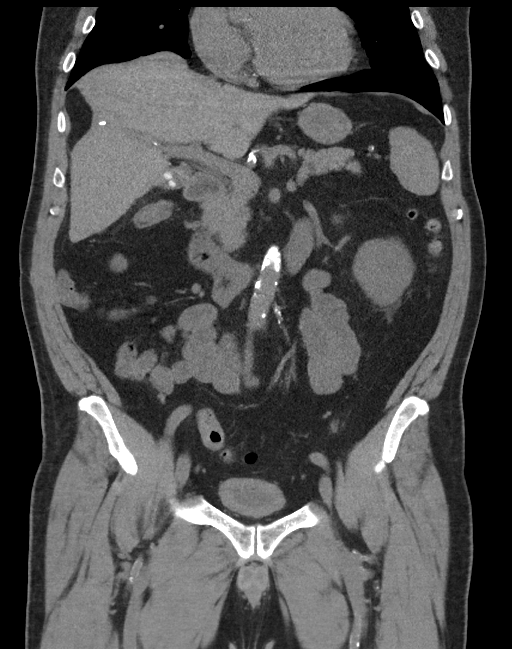
[im 58/105  soft-tissue]
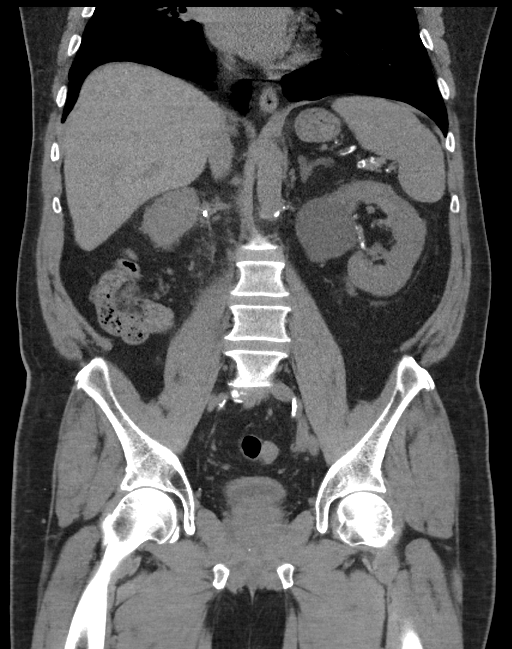

[15 of 46 positions shown; findings below may reference images not displayed]

FINDINGS: There is mild cardiomegaly. Calcific coronary atherosclerosis is
identified. No pleural or pericardial effusion. The lung bases are
clear.

Two small low attenuating lesions in the dome of the liver on image
15 are unchanged. The larger measures 1.3 cm in diameter.
Postoperative change along the right hepatic lobe is again
identified. The appearance of the liver is unchanged.

The patient is status post right nephrectomy. Abnormal soft tissue
attenuation in the nephrectomy bed in the right side of the L1
vertebral body is identified. Involvement in L1 was present on the
prior study but more conspicuous today. Discrete measurement is
difficult given lack of oral or IV contrast but the soft tissue mass
measures approximately 5.7 cm transverse by 7.9 cm AP on image 36
compared to 4.7 cm transverse by 6.4 cm AP on the most recent
examination.

The spleen, adrenal glands and pancreas appear normal. Left renal
cyst is unchanged. Aortoiliac atherosclerosis without aneurysm is
identified.

Prostate gland is mildly prominent. Urinary bladder and seminal
vesicles are unremarkable. A few colonic diverticula are seen but
there is no evidence of diverticulitis. As noted above, the mass in
the nephrectomy bed involves the proximal transverse colon as seen
on the prior exam.

Bones demonstrate progressive destructive change in the right side
of L1 from recurrent tumor as described above. Small sclerotic
lesion in the L4 vertebral body is unchanged and likely benign.
Lower lumbar facet arthropathy is noted.
IMPRESSION: Enlargement in the size of a mass in the right nephrectomy bed with
progressive invasion of the right side of the L1 vertebral body and
invasion of the proximal transverse colon and right psoas. The
appearance of the abdomen and pelvis is otherwise unchanged since
the most recent examination.

Gallstones without cholecystitis.

Atherosclerosis.
